# Patient Record
Sex: Male | Born: 1960 | Race: Black or African American | Hispanic: No | Smoking: Former smoker
Health system: Southern US, Community
[De-identification: ages and names within clinical notes are randomized; demographics above are authoritative.]

## PROBLEM LIST (undated history)

## (undated) DIAGNOSIS — J45909 Unspecified asthma, uncomplicated: Secondary | ICD-10-CM

## (undated) DIAGNOSIS — B1920 Unspecified viral hepatitis C without hepatic coma: Secondary | ICD-10-CM

## (undated) DIAGNOSIS — R51 Headache: Secondary | ICD-10-CM

## (undated) DIAGNOSIS — M549 Dorsalgia, unspecified: Secondary | ICD-10-CM

## (undated) DIAGNOSIS — I639 Cerebral infarction, unspecified: Secondary | ICD-10-CM

## (undated) DIAGNOSIS — G8929 Other chronic pain: Secondary | ICD-10-CM

## (undated) DIAGNOSIS — E7800 Pure hypercholesterolemia, unspecified: Secondary | ICD-10-CM

## (undated) DIAGNOSIS — F32A Depression, unspecified: Secondary | ICD-10-CM

## (undated) DIAGNOSIS — I10 Essential (primary) hypertension: Secondary | ICD-10-CM

## (undated) DIAGNOSIS — I2510 Atherosclerotic heart disease of native coronary artery without angina pectoris: Secondary | ICD-10-CM

## (undated) DIAGNOSIS — R42 Dizziness and giddiness: Secondary | ICD-10-CM

## (undated) DIAGNOSIS — K7460 Unspecified cirrhosis of liver: Secondary | ICD-10-CM

## (undated) DIAGNOSIS — E119 Type 2 diabetes mellitus without complications: Secondary | ICD-10-CM

## (undated) DIAGNOSIS — I219 Acute myocardial infarction, unspecified: Secondary | ICD-10-CM

## (undated) DIAGNOSIS — K8020 Calculus of gallbladder without cholecystitis without obstruction: Secondary | ICD-10-CM

## (undated) DIAGNOSIS — C801 Malignant (primary) neoplasm, unspecified: Secondary | ICD-10-CM

## (undated) DIAGNOSIS — G43109 Migraine with aura, not intractable, without status migrainosus: Secondary | ICD-10-CM

## (undated) DIAGNOSIS — M542 Cervicalgia: Secondary | ICD-10-CM

## (undated) DIAGNOSIS — R202 Paresthesia of skin: Secondary | ICD-10-CM

## (undated) DIAGNOSIS — F329 Major depressive disorder, single episode, unspecified: Secondary | ICD-10-CM

## (undated) HISTORY — PX: BYPASS GRAFT: SHX909

## (undated) HISTORY — PX: CARDIAC SURGERY: SHX584

## (undated) HISTORY — PX: CORONARY ANGIOPLASTY: SHX604

## (undated) HISTORY — PX: CORONARY ARTERY BYPASS GRAFT: SHX141

## (undated) HISTORY — PX: ORTHOPEDIC SURGERY: SHX850

---

## 1982-01-25 HISTORY — PX: HAND SURGERY: SHX662

## 2000-08-14 ENCOUNTER — Encounter: Payer: Self-pay | Admitting: Emergency Medicine

## 2000-08-14 ENCOUNTER — Emergency Department (HOSPITAL_COMMUNITY): Admission: EM | Admit: 2000-08-14 | Discharge: 2000-08-14 | Payer: Self-pay | Admitting: Emergency Medicine

## 2002-09-02 ENCOUNTER — Encounter: Payer: Self-pay | Admitting: Emergency Medicine

## 2002-09-02 ENCOUNTER — Emergency Department (HOSPITAL_COMMUNITY): Admission: EM | Admit: 2002-09-02 | Discharge: 2002-09-03 | Payer: Self-pay

## 2003-08-23 ENCOUNTER — Emergency Department (HOSPITAL_COMMUNITY): Admission: EM | Admit: 2003-08-23 | Discharge: 2003-08-23 | Payer: Self-pay | Admitting: Emergency Medicine

## 2005-04-28 ENCOUNTER — Emergency Department (HOSPITAL_COMMUNITY): Admission: EM | Admit: 2005-04-28 | Discharge: 2005-04-28 | Payer: Self-pay | Admitting: Emergency Medicine

## 2005-04-28 ENCOUNTER — Ambulatory Visit (HOSPITAL_COMMUNITY): Admission: RE | Admit: 2005-04-28 | Discharge: 2005-04-28 | Payer: Self-pay | Admitting: Pulmonary Disease

## 2006-02-28 ENCOUNTER — Inpatient Hospital Stay (HOSPITAL_COMMUNITY): Admission: EM | Admit: 2006-02-28 | Discharge: 2006-03-01 | Payer: Self-pay | Admitting: Emergency Medicine

## 2007-02-28 ENCOUNTER — Emergency Department (HOSPITAL_COMMUNITY): Admission: EM | Admit: 2007-02-28 | Discharge: 2007-02-28 | Payer: Self-pay | Admitting: Emergency Medicine

## 2008-04-14 ENCOUNTER — Emergency Department (HOSPITAL_COMMUNITY): Admission: EM | Admit: 2008-04-14 | Discharge: 2008-04-14 | Payer: Self-pay | Admitting: Emergency Medicine

## 2008-06-05 ENCOUNTER — Emergency Department: Payer: Self-pay | Admitting: Emergency Medicine

## 2009-07-02 ENCOUNTER — Ambulatory Visit: Payer: Self-pay | Admitting: Gastroenterology

## 2010-04-23 ENCOUNTER — Ambulatory Visit: Payer: Self-pay | Admitting: Gastroenterology

## 2010-05-07 LAB — POCT CARDIAC MARKERS

## 2010-05-07 LAB — ETHANOL: Alcohol, Ethyl (B): 41 mg/dL — ABNORMAL HIGH (ref 0–10)

## 2010-05-07 LAB — DIFFERENTIAL
Basophils Relative: 1 % (ref 0–1)
Eosinophils Relative: 6 % — ABNORMAL HIGH (ref 0–5)
Monocytes Relative: 9 % (ref 3–12)
Neutrophils Relative %: 35 % — ABNORMAL LOW (ref 43–77)

## 2010-05-07 LAB — CBC
HCT: 40.8 % (ref 39.0–52.0)
MCHC: 33.7 g/dL (ref 30.0–36.0)
MCV: 92.2 fL (ref 78.0–100.0)
RBC: 4.43 MIL/uL (ref 4.22–5.81)
RDW: 14.3 % (ref 11.5–15.5)
WBC: 6.4 10*3/uL (ref 4.0–10.5)

## 2010-05-07 LAB — COMPREHENSIVE METABOLIC PANEL
Albumin: 3.3 g/dL — ABNORMAL LOW (ref 3.5–5.2)
CO2: 28 mEq/L (ref 19–32)
Chloride: 101 mEq/L (ref 96–112)
Creatinine, Ser: 1.14 mg/dL (ref 0.4–1.5)
GFR calc non Af Amer: >60 mL/min (ref >60)
Glucose, Bld: 104 mg/dL — ABNORMAL HIGH (ref 70–99)
Potassium: 3.3 mEq/L — ABNORMAL LOW (ref 3.5–5.1)
Sodium: 136 mEq/L (ref 135–145)
Total Protein: 6.5 g/dL (ref 6.0–8.3)

## 2010-06-12 NOTE — H&P (Signed)
[name], [name]            ACCOUNT NO.:  [id number]   MEDICAL RECORD NO.:  [id number]          PATIENT TYPE:  OBV   LOCATION:  A216                          FACILITY:  [facility]   PHYSICIAN:  Skeet Latch, DO    DATE OF BIRTH:  1960/05/01   DATE OF ADMISSION:  02/26/2006  DATE OF DISCHARGE:  LH                              HISTORY & PHYSICAL   PRIMARY CARE PHYSIC[facility].   CHIEF COMPLAINT:  Dizziness.   HISTORY OF PRESENT ILLNESS:  This is a 50 year old African-American male  who presents complaining of 3-day history of dizziness.  The patient  states that he feels like his surroundings are spinning and this causes  nausea and blurry vision.  He denies any chest pain or syncopal episode.  The patient states symptoms are aggravated by certain positions and  especially with certain head movements.  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  The patient  states that at this time the dizziness does cause him to have a headache  and has never had this in the past.   PAST MEDICAL HISTORY:  Includes hypertension in which he was recently  placed on medications.   PAST SURGICAL HISTORY:  Orthopedic procedures on bilateral legs.   FAMILY HISTORY:  Positive for coronary artery disease, hypertension, and  diabetes.   SOCIAL HISTORY:  Alcohol abuse, drug abuse and is a cigarette smoker.   MEDICATIONS INCLUDE:  Folic acid 1 mg daily.  Artificial Tears.  Nicotine patch 7 mg q.24 h.  Multivitamin daily.  Thiamine 100 mg daily.  Lisinopril 40 mg daily.  Hydrochlorothiazide 25 mg daily.   ALLERGIES:  NO KNOWN DRUG ALLERGIES.   REVIEW OF SYSTEMS:  HEENT:  Positive headache, blurry vision.  NEUROVASCULAR:  Negative for chest pain and palpitations.  RESPIRATORY:  Denies any cough, dyspnea, or wheezing.  GI:  Positive for abdominal  pain, positive for nausea.  GENITOURINARY:  No dysuria, urgency, flank  pain.  MUSCULOSKELETAL:  Denies  any back pain or arthritic pain.  HEMATOLOGIC:  Denies any anemia, bleeding, or bruising.   PHYSICAL EXAMINATION:  VITAL SIGNS:  Temperature is 98.2, pulse 63,  blood pressure 128/93, the patient is saturating 100%.  HEENT:  Head is atraumatic, normocephalic.  Eyes are PERRLA.  Sclerae  did look slightly icteric.  NECK:  Supple.  No JVD.  No thyromegaly.  CARDIOVASCULAR:  Bradycardic and no rubs, gallops, murmurs.  RESPIRATORY:  Breath sounds equal bilaterally.  No rales, rhonchi, or  wheezing noted.  ABDOMEN:  Soft, nontender, nondistended.  No rigidity.  Positive bowel  sounds.  EXTREMITIES:  No clubbing, cyanosis, or edema noted.  NEURO:  Cranial nerves II-XII are grossly intact.  The patient is alert  and oriented x3.   LABORATORIES:  White blood cell count 6.5, hemoglobin 14.9, hematocrit  45.1, platelet count 137.  Sodium 137, potassium 3.6, chloride 102, CO2  is 30, glucose 132, BUN 5, creatinine 0.93, alkaline phosphatase 132,  AST 101, ALT 102, calcium is 9.4, albumin 3.2.   ASSESSMENT:  1. Benign positional vertigo.  2. Bradycardia.  3. Alcohol abuse.   PLAN:  The patient will be admitted into observation.  Vital signs will  be monitored closely.  We will IV hydrate patient with normal saline.  We will repeat CBC, BMP, calcium, alk phos in the morning.  I will also  get a urine drug screen as well as a TSH.  We will start the patient on  Antivert 25 mg p.o. t.i.d. for vertigo.  For bradycardia, we will repeat  an EKG in the morning.  We will also place the patient on alcohol  withdrawal management protocol secondary to patient being recently  released from rehab for cocaine and alcohol abuse.  Unsure if the  patient's dizziness may be related to recent placement of blood pressure  medications.  I will hold blood pressure medications at this time unless  the patient becomes hypertensive.  I will follow patient closely.      Skeet Latch, DO  Electronically  Signed     SM/MEDQ  D:  02/26/2006  T:  02/26/2006  Job:  [phone] [id number]

## 2010-06-12 NOTE — Discharge Summary (Signed)
[name], [name]            ACCOUNT NO.:  [id number]   MEDICAL RECORD NO.:  [id number]          PATIENT TYPE:  INP   LOCATION:  A216                          FACILITY:  [facility]   PHYSICIAN:  Skeet Latch, DO    DATE OF BIRTH:  1960/03/20   DATE OF ADMISSION:  02/26/2006  DATE OF DISCHARGE:  02/05/2008LH                               DISCHARGE SUMMARY   DISCHARGE DIAGNOSES:  1. Benign positional vertigo.  2. Alcohol and substance abuse.  3. Hypertension.  4. Elevated transaminases.   BRIEF HOSPITAL COURSE:  [name] is a 50 year old African-American  male who presented complaining of a 3-day history of dizziness.  The  patient said he felt like his surroundings were spinning.  It caused  nausea and some blurry vision.  The patient denied any chest pain,  syncopal episode, abdominal pain or urinary complaints.  The patient was  recently discharged from rehabilitation approximately 2 weeks ago for  cocaine and alcohol abuse.  The patient was placed on hypertensive  medications recently also, and is unsure if this caused his dizziness.  The patient was admitted to the telemetry unit for evaluation.  The  patient was placed on alcohol withdrawal protocol with Ativan.  The  patient was placed on Antivert, which seemed to help his dizziness.  Per  EKG, the patient was found to be slightly bradycardic.  This has  improved over the last few days.  The patient was subsequently started  on his home medications which include thiamine multivitamin and his  lisinopril.  The patient states that his headache is still present but  improved.  He states that he still has some dizziness, but the meclizine  has improved his vertigo.  His hepatitis panel is still pending.  The  patient did have elevated liver enzymes.  I believe this is due to his  alcohol abuse.  The patient did have a CT scan of his head, which was  within normal limits.  The patient was still hypertensive during his  stay and was placed on a clonidine 0.2 patch daily.  At this time, the  patient's lab values are stable, so the patient is ready for discharge.   DISCUSSION:  Condition is stable.  He will be discharged to home.   MEDICATIONS:  1. Clonidine patch 0.2 mg q weekly.  2. Lisinopril 40 mg daily.  3. Meclizine 25 mg p.o. t.i.d. p.r.n.  4. Thiamine 100 mg daily.  5. HCTZ 25 mg daily.  6. Multivitamin daily.   The patient has an appointment with a substance abuse counselor.  The  patient is instructed to stop all alcohol and any drug abuse of any  kind.  The nursing staff will make an appointment with an unassigned  physician in the next  102 weeks for the patient to have follow up.  The patient may need  either a neurology of an ENT consult as an outpatient if the dizziness  continues.  The patient is to have a low sodium heart healthy diet and  to increase his activity slowly.      Skeet Latch, DO  Electronically Signed     SM/MEDQ  D:  03/01/2006  T:  03/01/2006  Job:  [id number]

## 2010-10-16 LAB — RAPID URINE DRUG SCREEN, HOSP PERFORMED
Amphetamines: NOT DETECTED
Barbiturates: NOT DETECTED
Benzodiazepines: NOT DETECTED
Cocaine: NOT DETECTED
Opiates: NOT DETECTED
Tetrahydrocannabinol: NOT DETECTED

## 2010-10-16 LAB — BASIC METABOLIC PANEL
BUN: 14
CO2: 27
Calcium: 8.6
Creatinine, Ser: 0.96
GFR calc non Af Amer: >60
Glucose, Bld: 110 — ABNORMAL HIGH

## 2010-10-16 LAB — URINALYSIS, ROUTINE W REFLEX MICROSCOPIC
Bilirubin Urine: NEGATIVE
Glucose, UA: NEGATIVE
Hgb urine dipstick: NEGATIVE
Specific Gravity, Urine: 1.02

## 2010-10-16 LAB — POCT CARDIAC MARKERS
CKMB, poc: 2.3
Myoglobin, poc: 102
Myoglobin, poc: 132

## 2010-10-16 LAB — DIFFERENTIAL
Basophils Absolute: 0
Basophils Relative: 0
Monocytes Absolute: 0.5
Neutro Abs: 2.5

## 2010-10-16 LAB — B-NATRIURETIC PEPTIDE (CONVERTED LAB): Pro B Natriuretic peptide (BNP): <30

## 2010-10-16 LAB — CBC
Hemoglobin: 13.7
MCHC: 34.6
Platelets: 106 — ABNORMAL LOW
RDW: 13.9

## 2011-06-13 ENCOUNTER — Emergency Department (HOSPITAL_COMMUNITY): Payer: Non-veteran care

## 2011-06-13 ENCOUNTER — Inpatient Hospital Stay (HOSPITAL_COMMUNITY)
Admission: EM | Admit: 2011-06-13 | Discharge: 2011-06-16 | DRG: 103 | Disposition: A | Discharge status: Home / Self Care | Payer: Non-veteran care | Attending: Internal Medicine | Admitting: Internal Medicine

## 2011-06-13 ENCOUNTER — Encounter (HOSPITAL_COMMUNITY): Payer: Self-pay | Admitting: *Deleted

## 2011-06-13 DIAGNOSIS — E7800 Pure hypercholesterolemia, unspecified: Secondary | ICD-10-CM

## 2011-06-13 DIAGNOSIS — M549 Dorsalgia, unspecified: Secondary | ICD-10-CM | POA: Diagnosis present

## 2011-06-13 DIAGNOSIS — R4781 Slurred speech: Secondary | ICD-10-CM

## 2011-06-13 DIAGNOSIS — F329 Major depressive disorder, single episode, unspecified: Secondary | ICD-10-CM | POA: Diagnosis present

## 2011-06-13 DIAGNOSIS — B1920 Unspecified viral hepatitis C without hepatic coma: Secondary | ICD-10-CM

## 2011-06-13 DIAGNOSIS — R51 Headache: Secondary | ICD-10-CM

## 2011-06-13 DIAGNOSIS — I6330 Cerebral infarction due to thrombosis of unspecified cerebral artery: Secondary | ICD-10-CM

## 2011-06-13 DIAGNOSIS — I10 Essential (primary) hypertension: Secondary | ICD-10-CM

## 2011-06-13 DIAGNOSIS — G8929 Other chronic pain: Secondary | ICD-10-CM

## 2011-06-13 DIAGNOSIS — G43109 Migraine with aura, not intractable, without status migrainosus: Principal | ICD-10-CM | POA: Diagnosis present

## 2011-06-13 DIAGNOSIS — I639 Cerebral infarction, unspecified: Secondary | ICD-10-CM | POA: Diagnosis present

## 2011-06-13 DIAGNOSIS — R202 Paresthesia of skin: Secondary | ICD-10-CM

## 2011-06-13 DIAGNOSIS — R519 Headache, unspecified: Secondary | ICD-10-CM | POA: Diagnosis present

## 2011-06-13 DIAGNOSIS — F3289 Other specified depressive episodes: Secondary | ICD-10-CM | POA: Diagnosis present

## 2011-06-13 DIAGNOSIS — K7460 Unspecified cirrhosis of liver: Secondary | ICD-10-CM

## 2011-06-13 DIAGNOSIS — M542 Cervicalgia: Secondary | ICD-10-CM | POA: Diagnosis present

## 2011-06-13 DIAGNOSIS — G4733 Obstructive sleep apnea (adult) (pediatric): Secondary | ICD-10-CM | POA: Diagnosis present

## 2011-06-13 DIAGNOSIS — R209 Unspecified disturbances of skin sensation: Secondary | ICD-10-CM | POA: Diagnosis present

## 2011-06-13 DIAGNOSIS — F32A Depression, unspecified: Secondary | ICD-10-CM | POA: Diagnosis present

## 2011-06-13 DIAGNOSIS — R4789 Other speech disturbances: Secondary | ICD-10-CM | POA: Diagnosis present

## 2011-06-13 DIAGNOSIS — R079 Chest pain, unspecified: Secondary | ICD-10-CM | POA: Diagnosis not present

## 2011-06-13 DIAGNOSIS — Z87891 Personal history of nicotine dependence: Secondary | ICD-10-CM

## 2011-06-13 HISTORY — DX: Dorsalgia, unspecified: M54.9

## 2011-06-13 HISTORY — DX: Migraine with aura, not intractable, without status migrainosus: G43.109

## 2011-06-13 HISTORY — DX: Essential (primary) hypertension: I10

## 2011-06-13 HISTORY — DX: Dizziness and giddiness: R42

## 2011-06-13 HISTORY — DX: Cervicalgia: M54.2

## 2011-06-13 HISTORY — DX: Major depressive disorder, single episode, unspecified: F32.9

## 2011-06-13 HISTORY — DX: Other chronic pain: G89.29

## 2011-06-13 HISTORY — DX: Pure hypercholesterolemia, unspecified: E78.00

## 2011-06-13 HISTORY — DX: Headache: R51

## 2011-06-13 HISTORY — DX: Unspecified viral hepatitis C without hepatic coma: B19.20

## 2011-06-13 HISTORY — DX: Depression, unspecified: F32.A

## 2011-06-13 HISTORY — DX: Paresthesia of skin: R20.2

## 2011-06-13 HISTORY — DX: Unspecified cirrhosis of liver: K74.60

## 2011-06-13 HISTORY — DX: Calculus of gallbladder without cholecystitis without obstruction: K80.20

## 2011-06-13 LAB — DIFFERENTIAL
Lymphs Abs: 3.9 10*3/uL (ref 0.7–4.0)
Monocytes Relative: 8 % (ref 3–12)
Neutro Abs: 2.2 10*3/uL (ref 1.7–7.7)
Neutrophils Relative %: 31 % — ABNORMAL LOW (ref 43–77)

## 2011-06-13 LAB — HEPATIC FUNCTION PANEL
Albumin: 3.3 g/dL — ABNORMAL LOW (ref 3.5–5.2)
Total Bilirubin: 0.7 mg/dL (ref 0.3–1.2)
Total Protein: 7.1 g/dL (ref 6.0–8.3)

## 2011-06-13 LAB — URINALYSIS, ROUTINE W REFLEX MICROSCOPIC
Glucose, UA: NEGATIVE mg/dL
Hgb urine dipstick: NEGATIVE
Specific Gravity, Urine: 1.025 (ref 1.005–1.030)
pH: 6.5 (ref 5.0–8.0)

## 2011-06-13 LAB — BASIC METABOLIC PANEL
Chloride: 103 mEq/L (ref 96–112)
GFR calc Af Amer: >90 mL/min (ref >90)
GFR calc non Af Amer: 83 mL/min — ABNORMAL LOW (ref >90)
Glucose, Bld: 92 mg/dL (ref 70–99)
Potassium: 3.4 mEq/L — ABNORMAL LOW (ref 3.5–5.1)
Sodium: 140 mEq/L (ref 135–145)

## 2011-06-13 LAB — AMMONIA: Ammonia: 55 umol/L (ref 11–60)

## 2011-06-13 LAB — CBC
Hemoglobin: 13.9 g/dL (ref 13.0–17.0)
RBC: 4.75 MIL/uL (ref 4.22–5.81)

## 2011-06-13 MED ORDER — SODIUM CHLORIDE 0.9 % IV SOLN
INTRAVENOUS | Status: DC
  Administered 2011-06-13: 21:00:00 via INTRAVENOUS

## 2011-06-13 MED ORDER — FENTANYL CITRATE 0.05 MG/ML IJ SOLN
50.0000 ug | INTRAMUSCULAR | Status: AC | PRN
  Administered 2011-06-13 (×2): 50 ug via INTRAVENOUS
  Filled 2011-06-13 (×2): qty 2

## 2011-06-13 NOTE — ED Provider Notes (Signed)
History     CSN: [id number]  Arrival date & time 06/13/11  1308   First MD Initiated Contact with Patient 06/13/11 1956      Chief Complaint  Patient presents with  . Headache    HPI Pt was seen at 2000.  Per pt, c/o gradual onset and persistence of constant acute flair of his chronic migraine headache since today at approx 1300.  Describes the headache as per his usual chronic migraine headache pain pattern for the past several months.  Describes the pain as located in his frontal area, radiates all over his head "and all the way down my back."  States his headaches occur daily.  States he has been eval by his PMD at the [place] for same, dx "migraine headaches," but not given any pain meds "until they do more testing."  Cannot recall what testing his PMD wanted to do.  Endorses he has been eval by a Neuro MD at the [facility] 3 days ago when he noticed he had "slurred speech."  Cannot recall what this MD told him.  States today's headache has been associated with right sided facial and LUE numbness.  Denies any change in his usual chronic LLE paresthesias due to his chronic LBP.  Denies headache was sudden or maximal in onset or at any time.  Denies visual changes, no focal motor weakness, no dysphagia, no fevers, no injury, no rash, no CP/SOB, no cough, no abd pain, no N/V/D.      [facility]  Past Medical History  Diagnosis Date  . Hypertension   . Hepatitis C   . Cirrhosis of liver   . Depression   . Gallstones   . Chronic back pain   . Left leg paresthesias   . Vertigo   . Hypercholesteremia   . Chronic neck pain   . Headache     Past Surgical History  Procedure Date  . Orthopedic surgery     "on my legs"    History  Substance Use Topics  . Smoking status: Current Everyday Smoker  . Smokeless tobacco: Not on file  . Alcohol Use: No     former alcoholic    Review of Systems ROS: Statement: All systems negative except as marked or noted in the HPI; Constitutional: Negative for  fever and chills. ; ; Eyes: Negative for eye pain, redness and discharge. ; ; ENMT: Negative for ear pain, hoarseness, nasal congestion, sinus pressure and sore throat. ; ; Cardiovascular: Negative for chest pain, palpitations, diaphoresis, dyspnea and peripheral edema. ; ; Respiratory: Negative for cough, wheezing and stridor. ; ; Gastrointestinal: Negative for nausea, vomiting, diarrhea, abdominal pain, blood in stool, hematemesis, jaundice and rectal bleeding. . ; ; Genitourinary: Negative for dysuria, flank pain and hematuria. ; ; Musculoskeletal: Negative for swelling and trauma.; ; Skin: Negative for pruritus, rash, abrasions, blisters, bruising and skin lesion.; ; Neuro: +headache, slurred speech, paresthesias.  Negative for lightheadedness and neck stiffness. Negative for weakness, altered level of consciousness , altered mental status, extremity weakness, involuntary movement, seizure and syncope.     Allergies  Bee venom  Home Medications  No current outpatient prescriptions on file.  BP 152/95  Pulse 54  Temp(Src) 97.9 F (36.6 C) (Oral)  Resp 16  Ht 6' (1.829 m)  Wt 230 lb (104.327 kg)  BMI 31.19 kg/m2  SpO2 97%  Physical Exam 2005: Physical examination:  Nursing notes reviewed; Vital signs and O2 SAT reviewed;  Constitutional: Well developed, Well nourished,  Well hydrated, In no acute distress; Head:  Normocephalic, atraumatic; Eyes: EOMI, PERRL, No scleral icterus; ENMT: Mouth and pharynx normal, Mucous membranes moist; Neck: Supple, Full range of motion, No lymphadenopathy; Cardiovascular: Regular rate and rhythm, No murmur, rub, or gallop; Respiratory: Breath sounds clear & equal bilaterally, No rales, rhonchi, wheezes, or rub, Normal respiratory effort/excursion; Chest: Nontender, Movement normal; Abdomen: Soft, Nontender, Nondistended, Normal bowel sounds; Genitourinary: No CVA tenderness; Spine:  No midline CS, TS, LS tenderness.  +TTP left cervical, thoracic and lumbar  paraspinal muscles.; Extremities: Pulses normal, No tenderness, No edema, No calf edema or asymmetry.; Neuro: AA&Ox3, Major CN grossly intact.  Strength 5/5 equal bilat UE's and LE's.  DTR 2/4 equal bilat UE's and LE's.  +subjective decreased sensation right face, LUE and LLE; otherwise other gross sensory deficits.  Normal cerebellar testing bilat UE's and LE's.  Speech slurred.  No facial droop.  +right gaze horizontal fatigable nystagmus.; Skin: Color normal, Warm, Dry, no rash.   ED Course  Procedures    MDM  MDM Reviewed: nursing note, vitals and previous chart Reviewed previous: ECG Interpretation: CT scan, labs, x-ray and ECG      Date: 06/13/2011  Rate: 67  Rhythm: normal sinus rhythm  QRS Axis: normal  Intervals: normal  ST/T Wave abnormalities: nonspecific T wave changes, flipped T-waves leads V3-V6, II, III, aVF  Conduction Disutrbances:nonspecific intraventricular conduction delay  Narrative Interpretation:   Old EKG Reviewed: changes noted; flipped T-waves leads V4-V6, II, III, aVF.  Results for orders placed during the hospital encounter of 06/13/11  BASIC METABOLIC PANEL      Component Value Range   Sodium 140  135 - 145 (mEq/L)   Potassium 3.4 (*) 3.5 - 5.1 (mEq/L)   Chloride 103  96 - 112 (mEq/L)   CO2 29  19 - 32 (mEq/L)   Glucose, Bld 92  70 - 99 (mg/dL)   BUN 14  6 - 23 (mg/dL)   Creatinine, Ser 2.13  0.50 - 1.35 (mg/dL)   Calcium 9.4  8.4 - 08.6 (mg/dL)   GFR calc non Af Amer 83 (*) >90 (mL/min)   GFR calc Af Amer >90  >90 (mL/min)  CBC      Component Value Range   WBC 7.1  4.0 - 10.5 (K/uL)   RBC 4.75  4.22 - 5.81 (MIL/uL)   Hemoglobin 13.9  13.0 - 17.0 (g/dL)   HCT 57.8  46.9 - 62.9 (%)   MCV 86.5  78.0 - 100.0 (fL)   MCH 29.3  26.0 - 34.0 (pg)   MCHC 33.8  30.0 - 36.0 (g/dL)   RDW 52.8  41.3 - 24.4 (%)   Platelets 96 (*) 150 - 400 (K/uL)  DIFFERENTIAL      Component Value Range   Neutrophils Relative 31 (*) 43 - 77 (%)   Neutro Abs 2.2  1.7  - 7.7 (K/uL)   Lymphocytes Relative 55 (*) 12 - 46 (%)   Lymphs Abs 3.9  0.7 - 4.0 (K/uL)   Monocytes Relative 8  3 - 12 (%)   Monocytes Absolute 0.6  0.1 - 1.0 (K/uL)   Eosinophils Relative 6 (*) 0 - 5 (%)   Eosinophils Absolute 0.4  0.0 - 0.7 (K/uL)   Basophils Relative 0  0 - 1 (%)   Basophils Absolute 0.0  0.0 - 0.1 (K/uL)  URINALYSIS, ROUTINE W REFLEX MICROSCOPIC      Component Value Range   Color, Urine YELLOW  YELLOW  APPearance CLEAR  CLEAR    Specific Gravity, Urine 1.025  1.005 - 1.030    pH 6.5  5.0 - 8.0    Glucose, UA NEGATIVE  NEGATIVE (mg/dL)   Hgb urine dipstick NEGATIVE  NEGATIVE    Bilirubin Urine NEGATIVE  NEGATIVE    Ketones, ur NEGATIVE  NEGATIVE (mg/dL)   Protein, ur NEGATIVE  NEGATIVE (mg/dL)   Urobilinogen, UA >1.1 (*) 0.0 - 1.0 (mg/dL)   Nitrite NEGATIVE  NEGATIVE    Leukocytes, UA NEGATIVE  NEGATIVE   TROPONIN I      Component Value Range   Troponin I <0.30  <0.30 (ng/mL)  ETHANOL      Component Value Range   Alcohol, Ethyl (B) <11  0 - 11 (mg/dL)  HEPATIC FUNCTION PANEL      Component Value Range   Total Protein 7.1  6.0 - 8.3 (g/dL)   Albumin 3.3 (*) 3.5 - 5.2 (g/dL)   AST 71 (*) 0 - 37 (U/L)   ALT 62 (*) 0 - 53 (U/L)   Alkaline Phosphatase 153 (*) 39 - 117 (U/L)   Total Bilirubin 0.7  0.3 - 1.2 (mg/dL)   Bilirubin, Direct 0.3  0.0 - 0.3 (mg/dL)   Indirect Bilirubin 0.4  0.3 - 0.9 (mg/dL)  AMMONIA      Component Value Range   Ammonia 55  11 - 60 (umol/L)   Dg Chest 2 View 06/13/2011  *RADIOLOGY REPORT*  Clinical Data: Altered mental status, weakness.  CHEST - 2 VIEW  Comparison: 04/14/2008  Findings: Heart size upper normal to mildly enlarged.  Mild mediastinal prominence may be exaggerated by hypoaeration. Interstitial prominence and minimal lung base atelectasis, otherwise without focal consolidation, pleural effusion, or pneumothorax.  No acute osseous finding.  IMPRESSION: Heart size upper normal to mildly enlarged, with interstitial  prominence. Minimal linear lung base opacities, likely atelectasis. Otherwise, no focal consolidation.  Original Report Authenticated By: Waneta Martins, M.D.   Ct Head Wo Contrast 06/13/2011  *RADIOLOGY REPORT*  Clinical Data: Slurred speech, weakness.  CT HEAD WITHOUT CONTRAST  Technique:  Contiguous axial images were obtained from the base of the skull through the vertex without contrast.  Comparison: 04/14/2008  Findings: Focal hypoattenuation along the right external capsule is unchanged, perhaps a prior lacunar infarction. There is no evidence for acute hemorrhage, hydrocephalus, mass lesion, or abnormal extra- axial fluid collection.  No definite CT evidence for acute infarction.  The visualized paranasal sinuses and mastoid air cells are predominately clear.  IMPRESSION: No acute intracranial abnormality.  Original Report Authenticated By: Waneta Martins, M.D.    Results for JAMEIR, AKE (MRN [id number]) as of 06/13/2011 22:04  Ref. Range 04/14/2008 20:18 06/13/2011 20:50  AST Latest Range: 0-37 U/L 144 (H) 71 (H)  ALT Latest Range: 0-53 U/L 131 (H) 62 (H)      10:05 PM:   Pt not code stroke, as he has had symptoms x3 days.  Doubt SAH, as headache has not been sudden or maximal, and is no different from his usual chronic headache pain.  No acute CVA or bleeding seen on CT scan.  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  Need to r/o CVA vs complex migraine vs chronic pain.  T/C to [facility]. Orvan Falconer, case discussed, including:  HPI, pertinent PM/SHx, VS/PE, dx testing, ED course and treatment:  Agreeable to observation admit, requests to obtain tele bed.       Laray Anger,  DO 06/14/11 1719

## 2011-06-13 NOTE — ED Notes (Signed)
Pt reports today around 1 pm pt began to have a severe headache in frontal area and to back of head, pt reports pain is radiating down left neck

## 2011-06-13 NOTE — H&P (Signed)
PCP:   [facility]  Chief Complaint:  Slurred speech and right facial weakness  HPI: [name] is an 51 y.o. male.  Obese African American veteran with a history of liver cirrhosis, hypertension, migraine headaches, and chronic pain, chronic left leg paresthesias has been having severe headaches for the past 3 days, but reportedly his pain physicians and [facility] physicians have declined specific treatment pending further investigation. After midday today the patient noted that his speech was slurred and the right side of his face felt numb. Wife also reports such a pattern was slurred speech she noticed that his face was asymmetric. He arrived at the emergency room 5-1/2 hours after the onset of symptoms, facial weakness and slurred speech were noted. He has no old and new limb weakness, but he does report increased pain sensitivity in the right upper extremity.  Hospitalist service was called to assist with management about 8 hours after symptom onset.  There is no history of fever cough or cold chest pains or shortness black or bloody stool; he continues to have headache.  He has  screw in his left ankle 1989, but says he has had MRIs done since having that screw placed.  He has passed an initial swallowing screen.  Rewiew of Systems:  The patient denies anorexia, fever, weight loss,, vision loss, decreased hearing, hoarseness, chest pain, syncope, dyspnea on exertion, peripheral edema,, hemoptysis, abdominal pain, melena, hematochezia, severe indigestion/heartburn, hematuria, incontinence, genital sores, muscle weakness, suspicious skin lesions, transient blindness,  depression, unusual weight change, abnormal bleeding, enlarged lymph nodes, angioedema, and breast masses.    Past Medical History  Diagnosis Date  . Hypertension   . Hepatitis C   . Cirrhosis of liver   . Depression   . Gallstones   . Chronic back pain   . Left leg paresthesias   . Vertigo   .  Hypercholesteremia   . Chronic neck pain   . Headache     Past Surgical History  Procedure Date  . Orthopedic surgery     "on my legs"    Medications:  Medications not available at the time of  Admission; neither he nor his wife knows the names of any of his medications. HOME MEDS: Prior to Admission medications   Not on File     Allergies:  Allergies  Allergen Reactions  . Bee Venom Anaphylaxis and Swelling    Social History:   reports that he quit smoking about 2 years ago. He does not have any smokeless tobacco history on file. He reports that he does not drink alcohol or use illicit drugs.  Family History: Family History  Problem Relation Age of Onset  . Diabetes Brother   . Diabetes Sister   . Diabetes Father   . Diabetes Mother   . Hypertension Mother   . Hypertension Father   . Hypertension Sister   . Hypertension Brother      Physical Exam: Filed Vitals:   06/13/11 2100 06/13/11 2139 06/13/11 2149 06/13/11 2311  BP: 155/99 153/90 152/95 149/87  Pulse: 55 53 54 52  Temp:   97.9 F (36.6 C) 98 F (36.7 C)  TempSrc:   Oral Oral  Resp:  17 16 16   Height:      Weight:      SpO2: 95% 94% 97% 97%   Blood pressure 149/87, pulse 52, temperature 98 F (36.7 C), temperature source Oral, resp. rate 16, height 6' (1.829 m), weight 104.327 kg (230 lb), SpO2  97.00%.  GEN:  Pleasant middle-aged African American man lying in the stretcher in no acute distress; cooperative with exam Difficult to understand because of his slurred speech; his wife filled in some of the history. PSYCH:  alert and oriented x4; does not appear anxious or depressed; affect is appropriate. HEENT: Mucous membranes pink and anicteric; PERRLA; EOM intact; no cervical lymphadenopathy nor thyromegaly or carotid bruit; no JVD; Breasts:: Not examined CHEST WALL: No tenderness CHEST: Normal respiration, clear to auscultation bilaterally HEART: Regular rate and rhythm; no murmurs rubs or  gallops BACK:  no CVA tenderness ABDOMEN: Obese, soft non-tender; no masses, no organomegaly, normal abdominal bowel sounds; no pannus; no intertriginous candida. Rectal Exam: Not done EXTREMITIES:  age-appropriate arthropathy of the hands and knees; no edema; no ulcerations. Genitalia: not examined PULSES: 2+ and symmetric SKIN: Normal hydration no rash or ulceration CNS: Right facial droop: Skin site equal power in both upper extremities 4/5 on both sides; right upper extremity exquisitely sensitive to touch; says it feels; knee reflexes are equal bilaterally; no clonus   Labs & Imaging Results for orders placed during the hospital encounter of 06/13/11 (from the past 48 hour(s))  BASIC METABOLIC PANEL     Status: Abnormal   Collection Time   06/13/11  8:15 PM      Component Value Range Comment   Sodium 140  135 - 145 (mEq/L)    Potassium 3.4 (*) 3.5 - 5.1 (mEq/L)    Chloride 103  96 - 112 (mEq/L)    CO2 29  19 - 32 (mEq/L)    Glucose, Bld 92  70 - 99 (mg/dL)    BUN 14  6 - 23 (mg/dL)    Creatinine, Ser 8.65  0.50 - 1.35 (mg/dL)    Calcium 9.4  8.4 - 10.5 (mg/dL)    GFR calc non Af Amer 83 (*) >90 (mL/min)    GFR calc Af Amer >90  >90 (mL/min)   CBC     Status: Abnormal   Collection Time   06/13/11  8:15 PM      Component Value Range Comment   WBC 7.1  4.0 - 10.5 (K/uL)    RBC 4.75  4.22 - 5.81 (MIL/uL)    Hemoglobin 13.9  13.0 - 17.0 (g/dL)    HCT 78.4  69.6 - 29.5 (%)    MCV 86.5  78.0 - 100.0 (fL)    MCH 29.3  26.0 - 34.0 (pg)    MCHC 33.8  30.0 - 36.0 (g/dL)    RDW 28.4  13.2 - 44.0 (%)    Platelets 96 (*) 150 - 400 (K/uL)   DIFFERENTIAL     Status: Abnormal   Collection Time   06/13/11  8:15 PM      Component Value Range Comment   Neutrophils Relative 31 (*) 43 - 77 (%)    Neutro Abs 2.2  1.7 - 7.7 (K/uL)    Lymphocytes Relative 55 (*) 12 - 46 (%)    Lymphs Abs 3.9  0.7 - 4.0 (K/uL)    Monocytes Relative 8  3 - 12 (%)    Monocytes Absolute 0.6  0.1 - 1.0 (K/uL)     Eosinophils Relative 6 (*) 0 - 5 (%)    Eosinophils Absolute 0.4  0.0 - 0.7 (K/uL)    Basophils Relative 0  0 - 1 (%)    Basophils Absolute 0.0  0.0 - 0.1 (K/uL)   TROPONIN I     Status: Normal  Collection Time   06/13/11  8:15 PM      Component Value Range Comment   Troponin I <0.30  <0.30 (ng/mL)   ETHANOL     Status: Normal   Collection Time   06/13/11  8:15 PM      Component Value Range Comment   Alcohol, Ethyl (B) <11  0 - 11 (mg/dL)   URINALYSIS, ROUTINE W REFLEX MICROSCOPIC     Status: Abnormal   Collection Time   06/13/11  8:47 PM      Component Value Range Comment   Color, Urine YELLOW  YELLOW     APPearance CLEAR  CLEAR     Specific Gravity, Urine 1.025  1.005 - 1.030     pH 6.5  5.0 - 8.0     Glucose, UA NEGATIVE  NEGATIVE (mg/dL)    Hgb urine dipstick NEGATIVE  NEGATIVE     Bilirubin Urine NEGATIVE  NEGATIVE     Ketones, ur NEGATIVE  NEGATIVE (mg/dL)    Protein, ur NEGATIVE  NEGATIVE (mg/dL)    Urobilinogen, UA >1.6 (*) 0.0 - 1.0 (mg/dL)    Nitrite NEGATIVE  NEGATIVE     Leukocytes, UA NEGATIVE  NEGATIVE  MICROSCOPIC NOT DONE ON URINES WITH NEGATIVE PROTEIN, BLOOD, LEUKOCYTES, NITRITE, OR GLUCOSE <1000 mg/dL.  HEPATIC FUNCTION PANEL     Status: Abnormal   Collection Time   06/13/11  8:50 PM      Component Value Range Comment   Total Protein 7.1  6.0 - 8.3 (g/dL)    Albumin 3.3 (*) 3.5 - 5.2 (g/dL)    AST 71 (*) 0 - 37 (U/L)    ALT 62 (*) 0 - 53 (U/L)    Alkaline Phosphatase 153 (*) 39 - 117 (U/L)    Total Bilirubin 0.7  0.3 - 1.2 (mg/dL)    Bilirubin, Direct 0.3  0.0 - 0.3 (mg/dL)    Indirect Bilirubin 0.4  0.3 - 0.9 (mg/dL)   AMMONIA     Status: Normal   Collection Time   06/13/11  8:53 PM      Component Value Range Comment   Ammonia 55  11 - 60 (umol/L)    Dg Chest 2 View  06/13/2011  *RADIOLOGY REPORT*  Clinical Data: Altered mental status, weakness.  CHEST - 2 VIEW  Comparison: 04/14/2008  Findings: Heart size upper normal to mildly enlarged.  Mild  mediastinal prominence may be exaggerated by hypoaeration. Interstitial prominence and minimal lung base atelectasis, otherwise without focal consolidation, pleural effusion, or pneumothorax.  No acute osseous finding.  IMPRESSION: Heart size upper normal to mildly enlarged, with interstitial prominence. Minimal linear lung base opacities, likely atelectasis. Otherwise, no focal consolidation.  Original Report Authenticated By: Waneta Martins, M.D.   Ct Head Wo Contrast  06/13/2011  *RADIOLOGY REPORT*  Clinical Data: Slurred speech, weakness.  CT HEAD WITHOUT CONTRAST  Technique:  Contiguous axial images were obtained from the base of the skull through the vertex without contrast.  Comparison: 04/14/2008  Findings: Focal hypoattenuation along the right external capsule is unchanged, perhaps a prior lacunar infarction. There is no evidence for acute hemorrhage, hydrocephalus, mass lesion, or abnormal extra- axial fluid collection.  No definite CT evidence for acute infarction.  The visualized paranasal sinuses and mastoid air cells are predominately clear.  IMPRESSION: No acute intracranial abnormality.  Original Report Authenticated By: Waneta Martins, M.D.      Assessment Present on Admission:  .CVA (cerebral infarction) .Hypertension .Hepatitis C .Cirrhosis of  liver .Depression .Chronic back pain .Chronic neck pain .Headache .Hypercholesteremia .Left leg paresthesias   PLAN: We'll admit this gentleman for a stroke workup; Will resume his home pain medications when his meds are available Temporarily withhold antihypertensives; Neurology consult  Other plans as per orders.   Dashon Mcintire 06/13/2011, 11:39 PM

## 2011-06-14 ENCOUNTER — Inpatient Hospital Stay (HOSPITAL_COMMUNITY): Payer: Non-veteran care

## 2011-06-14 DIAGNOSIS — G8929 Other chronic pain: Secondary | ICD-10-CM

## 2011-06-14 DIAGNOSIS — I6330 Cerebral infarction due to thrombosis of unspecified cerebral artery: Secondary | ICD-10-CM

## 2011-06-14 DIAGNOSIS — I517 Cardiomegaly: Secondary | ICD-10-CM

## 2011-06-14 DIAGNOSIS — I10 Essential (primary) hypertension: Secondary | ICD-10-CM

## 2011-06-14 DIAGNOSIS — R51 Headache: Secondary | ICD-10-CM

## 2011-06-14 LAB — LIPID PANEL
Cholesterol: 184 mg/dL (ref 0–200)
HDL: 27 mg/dL — ABNORMAL LOW (ref >39)
LDL Cholesterol: 118 mg/dL — ABNORMAL HIGH (ref 0–99)
Total CHOL/HDL Ratio: 6.8 RATIO
Triglycerides: 194 mg/dL — ABNORMAL HIGH (ref <150)
VLDL: 39 mg/dL (ref 0–40)

## 2011-06-14 LAB — RAPID URINE DRUG SCREEN, HOSP PERFORMED
Barbiturates: NOT DETECTED
Cocaine: NOT DETECTED
Tetrahydrocannabinol: NOT DETECTED

## 2011-06-14 LAB — HEMOGLOBIN A1C
Hgb A1c MFr Bld: 5.8 % — ABNORMAL HIGH (ref <5.7)
Mean Plasma Glucose: 120 mg/dL — ABNORMAL HIGH (ref <117)

## 2011-06-14 LAB — APTT: aPTT: 34 seconds (ref 24–37)

## 2011-06-14 MED ORDER — TRAMADOL HCL 50 MG PO TABS
50.0000 mg | ORAL_TABLET | Freq: Two times a day (BID) | ORAL | Status: DC | PRN
  Administered 2011-06-14 – 2011-06-15 (×4): 50 mg via ORAL
  Filled 2011-06-14 (×4): qty 1

## 2011-06-14 MED ORDER — METHYLPREDNISOLONE SODIUM SUCC 1000 MG IJ SOLR
INTRAMUSCULAR | Status: AC
  Filled 2011-06-14: qty 8

## 2011-06-14 MED ORDER — GABAPENTIN 300 MG PO CAPS
600.0000 mg | ORAL_CAPSULE | Freq: Three times a day (TID) | ORAL | Status: DC
  Administered 2011-06-14 – 2011-06-16 (×7): 600 mg via ORAL
  Filled 2011-06-14: qty 2
  Filled 2011-06-14: qty 1
  Filled 2011-06-14 (×6): qty 2

## 2011-06-14 MED ORDER — ACETAMINOPHEN 325 MG PO TABS
650.0000 mg | ORAL_TABLET | ORAL | Status: DC | PRN
  Administered 2011-06-14 (×3): 650 mg via ORAL
  Filled 2011-06-14 (×4): qty 2

## 2011-06-14 MED ORDER — ENOXAPARIN SODIUM 40 MG/0.4ML ~~LOC~~ SOLN
40.0000 mg | SUBCUTANEOUS | Status: DC
  Administered 2011-06-14 – 2011-06-16 (×3): 40 mg via SUBCUTANEOUS
  Filled 2011-06-14 (×3): qty 0.4

## 2011-06-14 MED ORDER — BUTALBITAL-APAP-CAFFEINE 50-325-40 MG PO TABS
2.0000 | ORAL_TABLET | ORAL | Status: DC | PRN
  Administered 2011-06-14 – 2011-06-16 (×3): 2 via ORAL
  Filled 2011-06-14 (×3): qty 2

## 2011-06-14 MED ORDER — SODIUM CHLORIDE 0.9 % IV SOLN
500.0000 mg | INTRAVENOUS | Status: AC
  Administered 2011-06-14 – 2011-06-15 (×2): 500 mg via INTRAVENOUS
  Filled 2011-06-14 (×2): qty 4

## 2011-06-14 MED ORDER — ASPIRIN 325 MG PO TABS
325.0000 mg | ORAL_TABLET | Freq: Every day | ORAL | Status: DC
  Administered 2011-06-14 – 2011-06-16 (×3): 325 mg via ORAL
  Filled 2011-06-14 (×3): qty 1

## 2011-06-14 MED ORDER — SODIUM CHLORIDE 0.9 % IJ SOLN
INTRAMUSCULAR | Status: AC
  Filled 2011-06-14: qty 3

## 2011-06-14 MED ORDER — SODIUM CHLORIDE 0.9 % IV SOLN
INTRAVENOUS | Status: DC
  Filled 2011-06-14 (×2): qty 1000

## 2011-06-14 MED ORDER — ASPIRIN 300 MG RE SUPP
300.0000 mg | Freq: Every day | RECTAL | Status: DC
  Filled 2011-06-14 (×6): qty 1

## 2011-06-14 MED ORDER — LISINOPRIL 10 MG PO TABS: 40.0000 mg | ORAL_TABLET | Freq: Every day | ORAL | Status: DC

## 2011-06-14 MED ORDER — SODIUM CHLORIDE 0.9 % IJ SOLN
INTRAMUSCULAR | Status: AC
  Administered 2011-06-14: 09:00:00
  Filled 2011-06-14: qty 3

## 2011-06-14 MED ORDER — LISINOPRIL 10 MG PO TABS
10.0000 mg | ORAL_TABLET | Freq: Every day | ORAL | Status: DC
  Administered 2011-06-14 – 2011-06-16 (×3): 10 mg via ORAL
  Filled 2011-06-14 (×3): qty 1

## 2011-06-14 MED ORDER — SENNOSIDES-DOCUSATE SODIUM 8.6-50 MG PO TABS
1.0000 | ORAL_TABLET | Freq: Every evening | ORAL | Status: DC | PRN
  Administered 2011-06-14: 1 via ORAL
  Filled 2011-06-14: qty 1

## 2011-06-14 MED ORDER — ACETAMINOPHEN 650 MG RE SUPP: 650.0000 mg | RECTAL | Status: DC | PRN

## 2011-06-14 NOTE — Care Management Note (Signed)
    Page 1 of 1   06/16/2011     1:41:33 PM   CARE MANAGEMENT NOTE 06/16/2011  Patient:  [name], [name]   Account Number:  [id number]  Date Initiated:  06/14/2011  Documentation initiated by:  Rosemary Holms  Subjective/Objective Assessment:   Pt admitted with CVA symptoms. PTA lived at home with Significant Other who is in the room with pt. They state they were just at the [place] with same thing but they let him go.     Action/Plan:   Spoke to [facility]. Per Neurologist notes, pt had pain 9/10 headache and again on the 16th c/o of headache. Was scheduled for a MRI 06/14/11. [facility] not [facility] but cont...   Anticipated DC Date:  06/16/2011   Anticipated DC Plan:  HOME W HOME HEALTH SERVICES  In-house referral  Financial Counselor      DC Planning Services  CM consult  [facility] referrals / transfers      Choice offered to / List presented to:             Status of service:  Completed, signed off Medicare Important Message given?   (If response is "NO", the following Medicare IM given date fields will be blank) Date Medicare IM given:   Date Additional Medicare IM given:    Discharge Disposition:  HOME/SELF CARE  Per UR Regulation:    If discussed at Long Length of Stay Meetings, dates discussed:    Comments:  our finance dept should advise the [address] of admission. Lubertha Basque and Bonita Quin notified. [facility] faxed CM a form for pt to decide to stay at [facility] or elect transfer to [place]. If pt electes to stay at [facility], he will have less if any VA benefits. If he elects to transfer, he will have a greater benefit and the stay will be cheaper for pt. This was explained to pt, significant other and cousin. They will talk with pt's sisters and let me know by the end of the day. CM offered a telephone number for them to call to clarify information or obtain information. They declined. CM told them she iwll be here and available. Rosemary Holms RN BSN CM 06/14/11 1200   06/14/11 1613  Andrew Pohle Leanord Hawking RN BSN CM Pt sign [facility] form electing to transfer to a [facility] facility. Records faxed. Will wait word from [place].  06/16/11 1300 Demaris Leavell Leanord Hawking RN BSN CM No contact received from [facility]. CM will fax DC Summary.

## 2011-06-14 NOTE — Consult Note (Signed)
Reason for Consult: Referring Physician:   [name] is an 51 y.o. male.  HPI:  Past Medical History  Diagnosis Date  . Hypertension   . Hepatitis C   . Cirrhosis of liver   . Depression   . Gallstones   . Chronic back pain   . Left leg paresthesias   . Vertigo   . Hypercholesteremia   . Chronic neck pain   . Headache     Past Surgical History  Procedure Date  . Orthopedic surgery     "on my legs"    Family History  Problem Relation Age of Onset  . Diabetes Brother   . Diabetes Sister   . Diabetes Father   . Diabetes Mother   . Hypertension Mother   . Hypertension Father   . Hypertension Sister   . Hypertension Brother     Social History:  reports that he quit smoking about 2 years ago. He does not have any smokeless tobacco history on file. He reports that he does not drink alcohol or use illicit drugs.  Allergies:  Allergies  Allergen Reactions  . Bee Venom Anaphylaxis and Swelling    Medications:  Prior to Admission medications   Medication Sig Start Date End Date Taking? Authorizing Provider  amLODipine (NORVASC) 10 MG tablet Take 5 mg by mouth daily.   Yes Historical Provider, MD  aspirin 81 MG chewable tablet Chew 81 mg by mouth daily.   Yes Historical Provider, MD  Carboxymethylcell-Hypromellose (GENTEAL) 0.25-0.3 % GEL Apply 1 application to eye 2 (two) times daily. 0.3% one application to both eyes twice a day for ocular dryness   Yes Historical Provider, MD  cetirizine (ZYRTEC) 10 MG tablet Take 10 mg by mouth daily.   Yes Historical Provider, MD  cholecalciferol (VITAMIN D) 1000 UNITS tablet Take 1,000 Units by mouth daily.   Yes Historical Provider, MD  fish oil-omega-3 fatty acids 1000 MG capsule Take 3 g by mouth 2 (two) times daily.   Yes Historical Provider, MD  gabapentin (NEURONTIN) 300 MG capsule Take 600 mg by mouth 3 (three) times daily.   Yes Historical Provider, MD  hydrochlorothiazide (HYDRODIURIL) 25 MG tablet Take 25 mg by  mouth daily.   Yes Historical Provider, MD  lisinopril (PRINIVIL,ZESTRIL) 40 MG tablet Take 40 mg by mouth daily.   Yes Historical Provider, MD  meclizine (ANTIVERT) 25 MG tablet Take 25 mg by mouth 3 (three) times daily as needed.   Yes Historical Provider, MD  QUEtiapine (SEROQUEL) 100 MG tablet Take 50 mg by mouth at bedtime.   Yes Historical Provider, MD  sertraline (ZOLOFT) 100 MG tablet Take 200 mg by mouth every morning.   Yes Historical Provider, MD  traMADol (ULTRAM) 50 MG tablet Take 50 mg by mouth 2 (two) times daily as needed.   Yes Historical Provider, MD  traZODone (DESYREL) 100 MG tablet Take 200 mg by mouth at bedtime.   Yes Historical Provider, MD   Scheduled Meds:   . aspirin  300 mg Rectal Daily   Or  . aspirin  325 mg Oral Daily  . enoxaparin  40 mg Subcutaneous Q24H  . gabapentin  600 mg Oral TID  . lisinopril  10 mg Oral Daily  . sodium chloride      . DISCONTD: lisinopril  40 mg Oral Daily   Continuous Infusions:   . DISCONTD: sodium chloride 100 mL/hr at 06/13/11 2034  . DISCONTD: sodium chloride 0.9 % 1,000 mL with potassium  chloride 20 mEq infusion     PRN Meds:.acetaminophen, acetaminophen, fentaNYL, senna-docusate, traMADol   Results for orders placed during the hospital encounter of 06/13/11 (from the past 48 hour(s))  BASIC METABOLIC PANEL     Status: Abnormal   Collection Time   06/13/11  8:15 PM      Component Value Range Comment   Sodium 140  135 - 145 (mEq/L)    Potassium 3.4 (*) 3.5 - 5.1 (mEq/L)    Chloride 103  96 - 112 (mEq/L)    CO2 29  19 - 32 (mEq/L)    Glucose, Bld 92  70 - 99 (mg/dL)    BUN 14  6 - 23 (mg/dL)    Creatinine, Ser 4.54  0.50 - 1.35 (mg/dL)    Calcium 9.4  8.4 - 10.5 (mg/dL)    GFR calc non Af Amer 83 (*) >90 (mL/min)    GFR calc Af Amer >90  >90 (mL/min)   CBC     Status: Abnormal   Collection Time   06/13/11  8:15 PM      Component Value Range Comment   WBC 7.1  4.0 - 10.5 (K/uL)    RBC 4.75  4.22 - 5.81 (MIL/uL)      Hemoglobin 13.9  13.0 - 17.0 (g/dL)    HCT 09.8  11.9 - 14.7 (%)    MCV 86.5  78.0 - 100.0 (fL)    MCH 29.3  26.0 - 34.0 (pg)    MCHC 33.8  30.0 - 36.0 (g/dL)    RDW 82.9  56.2 - 13.0 (%)    Platelets 96 (*) 150 - 400 (K/uL)   DIFFERENTIAL     Status: Abnormal   Collection Time   06/13/11  8:15 PM      Component Value Range Comment   Neutrophils Relative 31 (*) 43 - 77 (%)    Neutro Abs 2.2  1.7 - 7.7 (K/uL)    Lymphocytes Relative 55 (*) 12 - 46 (%)    Lymphs Abs 3.9  0.7 - 4.0 (K/uL)    Monocytes Relative 8  3 - 12 (%)    Monocytes Absolute 0.6  0.1 - 1.0 (K/uL)    Eosinophils Relative 6 (*) 0 - 5 (%)    Eosinophils Absolute 0.4  0.0 - 0.7 (K/uL)    Basophils Relative 0  0 - 1 (%)    Basophils Absolute 0.0  0.0 - 0.1 (K/uL)   TROPONIN I     Status: Normal   Collection Time   06/13/11  8:15 PM      Component Value Range Comment   Troponin I <0.30  <0.30 (ng/mL)   ETHANOL     Status: Normal   Collection Time   06/13/11  8:15 PM      Component Value Range Comment   Alcohol, Ethyl (B) <11  0 - 11 (mg/dL)   URINALYSIS, ROUTINE W REFLEX MICROSCOPIC     Status: Abnormal   Collection Time   06/13/11  8:47 PM      Component Value Range Comment   Color, Urine YELLOW  YELLOW     APPearance CLEAR  CLEAR     Specific Gravity, Urine 1.025  1.005 - 1.030     pH 6.5  5.0 - 8.0     Glucose, UA NEGATIVE  NEGATIVE (mg/dL)    Hgb urine dipstick NEGATIVE  NEGATIVE     Bilirubin Urine NEGATIVE  NEGATIVE     Ketones, ur NEGATIVE  NEGATIVE (mg/dL)    Protein, ur NEGATIVE  NEGATIVE (mg/dL)    Urobilinogen, UA >8.6 (*) 0.0 - 1.0 (mg/dL)    Nitrite NEGATIVE  NEGATIVE     Leukocytes, UA NEGATIVE  NEGATIVE  MICROSCOPIC NOT DONE ON URINES WITH NEGATIVE PROTEIN, BLOOD, LEUKOCYTES, NITRITE, OR GLUCOSE <1000 mg/dL.  URINE RAPID DRUG SCREEN (HOSP PERFORMED)     Status: Normal   Collection Time   06/13/11  8:47 PM      Component Value Range Comment   Opiates NONE DETECTED  NONE DETECTED     Cocaine  NONE DETECTED  NONE DETECTED     Benzodiazepines NONE DETECTED  NONE DETECTED     Amphetamines NONE DETECTED  NONE DETECTED     Tetrahydrocannabinol NONE DETECTED  NONE DETECTED     Barbiturates NONE DETECTED  NONE DETECTED    HEPATIC FUNCTION PANEL     Status: Abnormal   Collection Time   06/13/11  8:50 PM      Component Value Range Comment   Total Protein 7.1  6.0 - 8.3 (g/dL)    Albumin 3.3 (*) 3.5 - 5.2 (g/dL)    AST 71 (*) 0 - 37 (U/L)    ALT 62 (*) 0 - 53 (U/L)    Alkaline Phosphatase 153 (*) 39 - 117 (U/L)    Total Bilirubin 0.7  0.3 - 1.2 (mg/dL)    Bilirubin, Direct 0.3  0.0 - 0.3 (mg/dL)    Indirect Bilirubin 0.4  0.3 - 0.9 (mg/dL)   AMMONIA     Status: Normal   Collection Time   06/13/11  8:53 PM      Component Value Range Comment   Ammonia 55  11 - 60 (umol/L)   PROTIME-INR     Status: Abnormal   Collection Time   06/14/11 12:00 AM      Component Value Range Comment   Prothrombin Time 16.0 (*) 11.6 - 15.2 (seconds)    INR 1.25  0.00 - 1.49    APTT     Status: Normal   Collection Time   06/14/11 12:00 AM      Component Value Range Comment   aPTT 34  24 - 37 (seconds)   HEMOGLOBIN A1C     Status: Abnormal   Collection Time   06/14/11  2:30 AM      Component Value Range Comment   Hemoglobin A1C 5.8 (*) <5.7 (%)    Mean Plasma Glucose 120 (*) <117 (mg/dL)   LIPID PANEL     Status: Abnormal   Collection Time   06/14/11  4:43 AM      Component Value Range Comment   Cholesterol 184  0 - 200 (mg/dL)    Triglycerides 578 (*) <150 (mg/dL)    HDL 27 (*) >46 (mg/dL)    Total CHOL/HDL Ratio 6.8      VLDL 39  0 - 40 (mg/dL)    LDL Cholesterol 962 (*) 0 - 99 (mg/dL)     Dg Eye Foreign Body  06/14/2011  *RADIOLOGY REPORT*  Clinical Data: Metal in eyes, previously removed, MRI clearance  ORBITS FOR FOREIGN BODY - 2 VIEW  Comparison: None  Findings: No orbital metallic foreign body. Hypoplastic right frontal sinus. Visualized bones sinuses otherwise unremarkable.  IMPRESSION:  No orbital metallic foreign body.  Original Report Authenticated By: Lollie Marrow, M.D.   Dg Chest 2 View  06/13/2011  *RADIOLOGY REPORT*  Clinical Data: Altered mental status, weakness.  CHEST - 2  VIEW  Comparison: 04/14/2008  Findings: Heart size upper normal to mildly enlarged.  Mild mediastinal prominence may be exaggerated by hypoaeration. Interstitial prominence and minimal lung base atelectasis, otherwise without focal consolidation, pleural effusion, or pneumothorax.  No acute osseous finding.  IMPRESSION: Heart size upper normal to mildly enlarged, with interstitial prominence. Minimal linear lung base opacities, likely atelectasis. Otherwise, no focal consolidation.  Original Report Authenticated By: Waneta Martins, M.D.   Ct Head Wo Contrast  06/13/2011  *RADIOLOGY REPORT*  Clinical Data: Slurred speech, weakness.  CT HEAD WITHOUT CONTRAST  Technique:  Contiguous axial images were obtained from the base of the skull through the vertex without contrast.  Comparison: 04/14/2008  Findings: Focal hypoattenuation along the right external capsule is unchanged, perhaps a prior lacunar infarction. There is no evidence for acute hemorrhage, hydrocephalus, mass lesion, or abnormal extra- axial fluid collection.  No definite CT evidence for acute infarction.  The visualized paranasal sinuses and mastoid air cells are predominately clear.  IMPRESSION: No acute intracranial abnormality.  Original Report Authenticated By: Waneta Martins, M.D.   Mr [facility] Wo Contrast  06/14/2011  *RADIOLOGY REPORT*  Clinical Data:  Slurred speech.  Weakness. Hypertension. Hypercholesterolemia.  MRI HEAD WITHOUT CONTRAST MRA HEAD WITHOUT CONTRAST  Technique: Multiplanar, multiecho pulse sequences of the brain and surrounding structures were obtained according to standard protocol without intravenous contrast.  Angiographic images of the head were obtained using MRA technique without contrast.  Comparison: 06/13/2011  CT.  No comparison MR.  MRI HEAD  Findings:  No acute infarct.  Artifact extends through the pons.  No intracranial hemorrhage.  Scattered punctate and patchy nonspecific white matter type changes probably related to result of small vessel disease in this hypertensive patient.  No hydrocephalus.  No intracranial mass lesion detected on this unenhanced exam.  Minimal paranasal sinus mucosal thickening.  IMPRESSION: No acute infarct.  Mild small vessel disease type changes.  MRA HEAD  Findings: Ectatic distal vertical cervical segment and petrous segment of the internal carotid artery bilaterally.  Ectasia with areas of mild to moderate narrowing involving portions of the cavernous and supraclinoid segment of the internal carotid artery bilaterally.  Fetal type origin of the posterior cerebral artery bilaterally.  Hypoplastic A1 segment of the right anterior cerebral artery.  Moderate to marked stenosis of the middle cerebral artery branch vessels more notable on the right.  Poor delineation of the PICA bilaterally.  Mild to slightly moderate narrowing distal aspect of the right vertebral artery with mild narrowing distal aspect of the left vertebral artery.  Ectatic basilar artery with mild narrowing and irregularity.  Nonvisualization left AICA.  Irregularity and narrowing of the superior cerebellar artery and posterior cerebral artery bilaterally with moderate to marked tandem stenoses right posterior cerebral artery.  No aneurysm or vascular malformation detected on this slightly motion degraded exam.  IMPRESSION: Intracranial atherosclerotic type changes as detailed above.  Original Report Authenticated By: Fuller Canada, M.D.   Mr Brain Wo Contrast  06/14/2011  *RADIOLOGY REPORT*  Clinical Data:  Slurred speech.  Weakness. Hypertension. Hypercholesterolemia.  MRI HEAD WITHOUT CONTRAST MRA HEAD WITHOUT CONTRAST  Technique: Multiplanar, multiecho pulse sequences of the brain and surrounding structures were  obtained according to standard protocol without intravenous contrast.  Angiographic images of the head were obtained using MRA technique without contrast.  Comparison: 06/13/2011 CT.  No comparison MR.  MRI HEAD  Findings:  No acute infarct.  Artifact extends through the pons.  No intracranial hemorrhage.  Scattered punctate and patchy nonspecific white matter type changes probably related to result of small vessel disease in this hypertensive patient.  No hydrocephalus.  No intracranial mass lesion detected on this unenhanced exam.  Minimal paranasal sinus mucosal thickening.  IMPRESSION: No acute infarct.  Mild small vessel disease type changes.  MRA HEAD  Findings: Ectatic distal vertical cervical segment and petrous segment of the internal carotid artery bilaterally.  Ectasia with areas of mild to moderate narrowing involving portions of the cavernous and supraclinoid segment of the internal carotid artery bilaterally.  Fetal type origin of the posterior cerebral artery bilaterally.  Hypoplastic A1 segment of the right anterior cerebral artery.  Moderate to marked stenosis of the middle cerebral artery branch vessels more notable on the right.  Poor delineation of the PICA bilaterally.  Mild to slightly moderate narrowing distal aspect of the right vertebral artery with mild narrowing distal aspect of the left vertebral artery.  Ectatic basilar artery with mild narrowing and irregularity.  Nonvisualization left AICA.  Irregularity and narrowing of the superior cerebellar artery and posterior cerebral artery bilaterally with moderate to marked tandem stenoses right posterior cerebral artery.  No aneurysm or vascular malformation detected on this slightly motion degraded exam.  IMPRESSION: Intracranial atherosclerotic type changes as detailed above.  Original Report Authenticated By: Fuller Canada, M.D.    Review of Systems  Constitutional: Negative.   Eyes: Positive for blurred vision.  Respiratory:  Positive for shortness of breath.   Cardiovascular: Positive for chest pain.  Gastrointestinal: Positive for heartburn, nausea and vomiting.  Genitourinary: Negative.   Musculoskeletal: Negative.   Neurological: Positive for headaches.  Endo/Heme/Allergies: Negative.   Psychiatric/Behavioral: Negative.    Blood pressure 157/91, pulse 55, temperature 98.3 F (36.8 C), temperature source Oral, resp. rate 20, height 6\' 2"  (1.88 m), weight 104.5 kg (230 lb 6.1 oz), SpO2 96.00%. Physical Exam  Assessment/Plan: See dict  Charlena Haub 06/14/2011, 7:11 PM
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
## 2011-06-14 NOTE — ED Notes (Signed)
Report given to [facility] on 300

## 2011-06-14 NOTE — Consult Note (Signed)
[name], [name]            ACCOUNT NO.:  [id number]  MEDICAL RECORD NO.:  [id number]  LOCATION:  A305                          FACILITY:  [facility]  PHYSICIAN:  Delphin Funes A. Gerilyn Pilgrim, M.D. DATE OF BIRTH:  22-Mar-1960  DATE OF CONSULTATION: DATE OF DISCHARGE:                                CONSULTATION   HISTORY OF PRESENT ILLNESS:  The patient is a 51 year old right-handed black male, who does have baseline headaches as a longstanding issue. The patient also has chronic pain problems and has seen Pain Management. He goes to [place] for most of his healthcare, and this is the [facility] [place] in [place].  The patient has had episodic headaches over the past 3 days. His headaches have been quite severe.  The location involves the frontal and occipital areas bilaterally.  He does have photophobia, phonophobia, nausea, and sometimes emesis.  The patient reports that he has been given over-the-counter medication, which has not been very beneficial. He apparently was in process of having these severe headaches worked up, but decided to come to the emergency room because his headaches were so severe.  He furthermore has had facial droop on the right side and he reports dropping things on the left side as the recent finding resulted in the patient coming to the ER.  The patient presented yesterday and still has similar symptoms.  There were some reports of slurred speech. He does have a long history of hypertension.  He is on triple medications for BP control.  These were held by Tele-neurology because of the concern of acute stroke.  Initial head CT scan was negative.  The patient's girlfriend is concerned of the patient not breathing well at nighttime.  She reports that he quits breathing.  He snores loudly.  He also had chronic chest discomfort associated with burning in retrosternal area and has dyspnea on exertion.  PHYSICAL EXAMINATION:  GENERAL:  Overweight man, in no acute distress. The room is  closed and the lights are out.  NECK:  Supple. HEAD:  Normocephalic, atraumatic. ABDOMEN:  Obese, but soft. EXTREMITIES:  No swelling or edema. MENTATION:  He is awake and alert.  Speech, language, and cognition are intact. CRANIAL NERVES:  The right pupil is 5-mm and the left is 4.  Both are briskly reactive.  Seemed to have anisocoria.  Extraocular movements are intact.  Does seem to have mild cataracts in the right eye.  Visual fields are intact.  Facial muscle strength shows slight flattening of nasolabial fold on the right, although he has good strength on testing; however, tongue is midline.  Uvula midline.  He does have a crowded posterior airspace and the reddened uvula indicating that the patient likely has apnea.  He does have Tinel sign at the occipital area bilaterally, more on the right side.  Motor examination shows normal tone, bulk, and strength.  No pronator drift noted.  Coordination shows normal finger-to-nose.  No dysmetria.  No tremors.  No parkinsonism. Reflexes 2+.  Plantars are both flexor.  Sensation normal to temperature and light touch.  Brain MRI reviewed in person and does show scattered tiny frontal hyperintensity, that is nonspecific.  No orientation.  Worrisome for demyelinating processes.  Nothing acute seen on diffusion imaging.  No blood seen on echo gradient sequence.  MRA is essentially unremarkable.  ASSESSMENT: 1. I suspect the patient likely has complicated migraines.  Initial     workup was worrisome for an ischemic stroke, possibly transient     ischemic attack, but I think this is unlikely. 2. Hypertension. 3. Likely significant obstructive sleep apnea syndrome. 4. Anisocoria, possible physiologic.  RECOMMENDATIONS:  He has an echo and carotid pending.  I suggest that the patient be evaluated for obstructive sleep apnea syndrome.  This can be done in an outpatient setting.  Certainly, if he has this, this will affect hypertension control  and can cause poor headache control. Regarding the symptomatic treatment, I am going to suggest bolus of Solu- Medrol 500 mg for the next couple days, also try Fioricet for more symptomatic relief right now.  I would restart his BP medications, especially if carotids are negative.  I will continue to follow this patient.  Thanks for this consultation.     Lukka Black A. Gerilyn Pilgrim, M.D.     KAD/MEDQ  D:  06/14/2011  T:  06/14/2011  Job:  [id number]

## 2011-06-14 NOTE — Progress Notes (Signed)
UR chart review completed. [name]  

## 2011-06-14 NOTE — Progress Notes (Signed)
Subjective: This man was admitted to yesterday night with diagnosis of new CVA clinically. He has had slurred speech and facial palsy. His symptoms actually started a few days ago and he was seen at the [facility] but apparently was not admitted.           Physical Exam: Blood pressure 180/111, pulse 56, temperature 97.6 F (36.4 C), temperature source Oral, resp. rate 20, height 6\' 2"  (1.88 m), weight 104.5 kg (230 lb 6.1 oz), SpO2 96.00%. Looks systemically well. He is alert and orientated. His speech is slightly slurred now but not significantly so. He does not have any dysphagia. He does appear to have a right upper motor neuron facial palsy which is mild. He appears to have left arm weakness more than the right. Heart sounds are present and normal. There are no carotid bruits. Lung fields are clear.   Investigations:     Basic Metabolic Panel:  Basename 06/13/11 2015  NA 140  K 3.4*  CL 103  CO2 29  GLUCOSE 92  BUN 14  CREATININE 1.02  CALCIUM 9.4  MG --  PHOS --   Liver Function Tests:  Basename 06/13/11 2050  AST 71*  ALT 62*  ALKPHOS 153*  BILITOT 0.7  PROT 7.1  ALBUMIN 3.3*     CBC:  Basename 06/13/11 2015  WBC 7.1  NEUTROABS 2.2  HGB 13.9  HCT 41.1  MCV 86.5  PLT 96*    Dg Chest 2 View  06/13/2011  *RADIOLOGY REPORT*  Clinical Data: Altered mental status, weakness.  CHEST - 2 VIEW  Comparison: 04/14/2008  Findings: Heart size upper normal to mildly enlarged.  Mild mediastinal prominence may be exaggerated by hypoaeration. Interstitial prominence and minimal lung base atelectasis, otherwise without focal consolidation, pleural effusion, or pneumothorax.  No acute osseous finding.  IMPRESSION: Heart size upper normal to mildly enlarged, with interstitial prominence. Minimal linear lung base opacities, likely atelectasis. Otherwise, no focal consolidation.  Original Report Authenticated By: Waneta Martins, M.D.   Ct Head Wo  Contrast  06/13/2011  *RADIOLOGY REPORT*  Clinical Data: Slurred speech, weakness.  CT HEAD WITHOUT CONTRAST  Technique:  Contiguous axial images were obtained from the base of the skull through the vertex without contrast.  Comparison: 04/14/2008  Findings: Focal hypoattenuation along the right external capsule is unchanged, perhaps a prior lacunar infarction. There is no evidence for acute hemorrhage, hydrocephalus, mass lesion, or abnormal extra- axial fluid collection.  No definite CT evidence for acute infarction.  The visualized paranasal sinuses and mastoid air cells are predominately clear.  IMPRESSION: No acute intracranial abnormality.  Original Report Authenticated By: Waneta Martins, M.D.      Medications: I have reviewed the patient's current medications.  Impression: 1. Recent clinical CVA, workup pending. 2. Hypertension, currently uncontrolled. 3. Hepatitis C with cirrhosis of the liver. 4. Depression. 5. Hyperlipidemia.     Plan: 1. Try to reduce his blood pressure very gradually. Start lisinopril 10 mg daily. Titrate antihypertensive medications to try to avoid hypotension, increasing the risk of further stroke. 2. Stroke workup. 3. Await neurology consultation.     LOS: 1 day   Wilson Singer Pager [phone]  06/14/2011, 8:17 AM

## 2011-06-14 NOTE — Evaluation (Signed)
Speech Language Pathology Evaluation Patient Details Name: [name] MRN: [id number] DOB: Jul 11, 1960 Today's Date: 06/14/2011 Time: 4098-1191 SLP Time Calculation (min): 30 min  Problem List:  Patient Active Problem List  Diagnoses  . Hypertension  . Hepatitis C  . Cirrhosis of liver  . Depression  . Chronic back pain  . Left leg paresthesias  . Hypercholesteremia  . Chronic neck pain  . Headache  . CVA (cerebral infarction)   Past Medical History:  Past Medical History  Diagnosis Date  . Hypertension   . Hepatitis C   . Cirrhosis of liver   . Depression   . Gallstones   . Chronic back pain   . Left leg paresthesias   . Vertigo   . Hypercholesteremia   . Chronic neck pain   . Headache    Past Surgical History:  Past Surgical History  Procedure Date  . Orthopedic surgery     "on my legs"    Assessment / Plan / Recommendation Clinical Impression  Pt's clinical symptom manifestations appear inconsistent at times: Speech intelligibility is reduced (does appear to have [facility] facial palsy), decreased processing speed, and memory deficits which are all likely negatively impacted by fatigue and pain. Pt would benefit from skilled SLP intervention in the acute setting to maximize independence with cognitive linguistic skills.     SLP Assessment  Patient needs continued Speech Lanaguage Pathology Services    Follow Up Recommendations  Outpatient SLP    Frequency and Duration min 2x/week  1 week   Pertinent Vitals/Pain    SLP Goals  To be determined.  SLP Goals Potential to Achieve Goals: Good  SLP Evaluation Prior Functioning  Type of Home: House Lives With: Spouse Education: 12 grade education Vocation: Unemployed   Cognition  Overall Cognitive Status: Impaired Arousal/Alertness: Awake/alert Orientation Level: Oriented to person;Oriented to place;Oriented to situation;Disoriented to time (Stated date to be June 30, 2011 (06/14/2011)) Attention:  Focused Focused Attention: Impaired Focused Attention Impairment: Verbal complex;Functional complex Memory: Impaired Memory Impairment: Storage deficit;Retrieval deficit;Decreased recall of new information (Pt scored 0/12 with 4 item recall with cues.) Awareness: Appears intact Problem Solving: Impaired Problem Solving Impairment: Functional complex Executive Function: Self Monitoring Self Monitoring: Impaired Self Monitoring Impairment: Verbal complex;Functional complex Safety/Judgment:  (To be determined.)    Comprehension  Auditory Comprehension Overall Auditory Comprehension: Impaired Yes/No Questions: Within Functional Limits Commands: Within Functional Limits Interfering Components: Attention;Pain;Processing speed (fatigue) EffectiveTechniques: Extra processing time;Repetition Visual Recognition/Discrimination Discrimination: Within Function Limits Reading Comprehension Reading Status: Within funtional limits    Expression Expression Primary Mode of Expression: Verbal Verbal Expression Overall Verbal Expression: Impaired Initiation: No impairment Level of Generative/Spontaneous Verbalization: Conversation Repetition: No impairment Naming: Impairment Responsive: Other (comment) Confrontation: Within functional limits Divergent: 50-74% accurate (10 animals in 60 seconds, norm = 30+/- 9) Pragmatics: No impairment Interfering Components: Attention;Speech intelligibility Non-Verbal Means of Communication: Not applicable Written Expression Dominant Hand: Right (reports that he uses his left hand for a lot of thins too) Written Expression: Not tested   Oral / Motor Oral Motor/Sensory Function Overall Oral Motor/Sensory Function: Impaired Labial ROM: Reduced right Labial Symmetry: Abnormal symmetry right Labial Strength: Reduced Lingual ROM: Within Functional Limits Lingual Symmetry: Within Functional Limits Lingual Strength: Reduced Lingual Sensation: Within  Functional Limits Facial ROM: Reduced right Mandible: Within Functional Limits Motor Speech Overall Motor Speech: Impaired Phonation: Normal;Low vocal intensity Resonance: Within functional limits Articulation: Impaired Level of Impairment: Phrase Intelligibility: Intelligibility reduced Word: 75-100% accurate Phrase: 50-74% accurate Sentence: 50-74%  accurate Conversation: 50-74% accurate Motor Planning: Witnin functional limits Motor Speech Errors: Aware Effective Techniques: Pause     Andrei Mccook 06/14/2011, 4:59 PM

## 2011-06-14 NOTE — Progress Notes (Signed)
*  PRELIMINARY RESULTS* Echocardiogram 2D Echocardiogram has been performed.  [name] 06/14/2011, 2:28 PM

## 2011-06-15 ENCOUNTER — Inpatient Hospital Stay (HOSPITAL_COMMUNITY): Payer: Non-veteran care

## 2011-06-15 DIAGNOSIS — I10 Essential (primary) hypertension: Secondary | ICD-10-CM

## 2011-06-15 DIAGNOSIS — G8929 Other chronic pain: Secondary | ICD-10-CM

## 2011-06-15 DIAGNOSIS — G43809 Other migraine, not intractable, without status migrainosus: Secondary | ICD-10-CM

## 2011-06-15 DIAGNOSIS — F329 Major depressive disorder, single episode, unspecified: Secondary | ICD-10-CM

## 2011-06-15 DIAGNOSIS — F3289 Other specified depressive episodes: Secondary | ICD-10-CM

## 2011-06-15 LAB — COMPREHENSIVE METABOLIC PANEL
AST: 59 U/L — ABNORMAL HIGH (ref 0–37)
Albumin: 3.1 g/dL — ABNORMAL LOW (ref 3.5–5.2)
Calcium: 9.2 mg/dL (ref 8.4–10.5)
Chloride: 103 mEq/L (ref 96–112)
Creatinine, Ser: 0.92 mg/dL (ref 0.50–1.35)

## 2011-06-15 LAB — CBC
HCT: 43.5 % (ref 39.0–52.0)
Hemoglobin: 14.8 g/dL (ref 13.0–17.0)
MCH: 29.4 pg (ref 26.0–34.0)
MCHC: 34 g/dL (ref 30.0–36.0)
MCV: 86.3 fL (ref 78.0–100.0)
RBC: 5.04 MIL/uL (ref 4.22–5.81)

## 2011-06-15 LAB — CARDIAC PANEL(CRET KIN+CKTOT+MB+TROPI)
CK, MB: 2.7 ng/mL (ref 0.3–4.0)
Troponin I: <0.3 ng/mL (ref <0.30)

## 2011-06-15 LAB — URINE CULTURE
Colony Count: NO GROWTH
Culture: NO GROWTH

## 2011-06-15 MED ORDER — KETOROLAC TROMETHAMINE 30 MG/ML IJ SOLN
30.0000 mg | Freq: Once | INTRAMUSCULAR | Status: AC
  Administered 2011-06-15: 30 mg via INTRAVENOUS
  Filled 2011-06-15: qty 1

## 2011-06-15 MED ORDER — SODIUM CHLORIDE 0.9 % IJ SOLN
INTRAMUSCULAR | Status: AC
  Filled 2011-06-15: qty 3

## 2011-06-15 MED ORDER — NITROGLYCERIN 0.4 MG SL SUBL
SUBLINGUAL_TABLET | SUBLINGUAL | Status: AC
  Administered 2011-06-15: 0.4 mg
  Filled 2011-06-15: qty 25

## 2011-06-15 MED ORDER — ALPRAZOLAM 0.25 MG PO TABS
0.2500 mg | ORAL_TABLET | Freq: Three times a day (TID) | ORAL | Status: DC | PRN
  Administered 2011-06-15 (×2): 0.25 mg via ORAL
  Filled 2011-06-15 (×2): qty 1

## 2011-06-15 MED ORDER — POTASSIUM CHLORIDE CRYS ER 20 MEQ PO TBCR
40.0000 meq | EXTENDED_RELEASE_TABLET | Freq: Once | ORAL | Status: AC
  Administered 2011-06-15: 40 meq via ORAL
  Filled 2011-06-15: qty 2

## 2011-06-15 NOTE — Progress Notes (Signed)
[name], [name]            ACCOUNT NO.:  [id number]  MEDICAL RECORD NO.:  [id number]  LOCATION:  A305                          FACILITY:  [facility]  PHYSICIAN:  Vahe Pienta A. Gerilyn Pilgrim, M.D. DATE OF BIRTH:  1961-01-01  DATE OF PROCEDURE: DATE OF DISCHARGE:                                PROGRESS NOTE   He still reports that he has a headache that are episodic lasting about an hour or so sometimes, there seems to be some fluctuation, again the headache involves the frontal and occipital areas bilaterally.  He has gotten the first dose of Solu-Medrol today so far; this was given this morning, and has made a difference.  Apparently, the Fioricet also has made a different.  Today, he is awake and alert.  He is lucid and coherent.  Neck is supple.  He has symmetric facial muscles and normal limb strength throughout.  ASSESSMENT AND PLAN:  Likely complicated migraine headaches.  We are going to go ahead and add Toradol to see how he responds to this. Continue with the other medications.     Lyssa Hackley A. Gerilyn Pilgrim, M.D.     KAD/MEDQ  D:  06/15/2011  T:  06/15/2011  Job:  [id number]

## 2011-06-15 NOTE — Progress Notes (Signed)
Subjective: Patient complains of ongoing headache. He does not feel his weakness is any better. He is very tearful. Admits to feeling very depressed. Denies any suicidal ideations. Reports that he's been battling depression for a few years now. He is on an antidepressant which he does not feel has been benefiting him. He often feels very anxious.  Objective: Vital signs in last 24 hours: Temp:  [97.2 F (36.2 C)-98.3 F (36.8 C)] 97.9 F (36.6 C) (05/21 1408) Pulse Rate:  [53-67] 61  (05/21 1408) Resp:  [16-20] 20  (05/21 1408) BP: (146-172)/(82-91) 162/82 mmHg (05/21 1408) SpO2:  [95 %-98 %] 98 % (05/21 1408) Weight change:  Last BM Date: 06/14/11  Intake/Output from previous day: 05/20 0701 - 05/21 0700 In: 720 [P.O.:720] Out: -  Total I/O In: 600 [P.O.:600] Out: -    Physical Exam: General: Alert, awake, oriented x3, in no acute distress. HEENT: No bruits, no goiter. Heart: Regular rate and rhythm, without murmurs, rubs, gallops. Lungs: Clear to auscultation bilaterally. Abdomen: Soft, nontender, nondistended, positive bowel sounds. Extremities: No clubbing cyanosis or edema with positive pedal pulses. Neuro: Grossly intact, nonfocal.    Lab Results: Basic Metabolic Panel:  Basename 06/15/11 0514 06/13/11 2015  NA 138 140  K 3.3* 3.4*  CL 103 103  CO2 25 29  GLUCOSE 218* 92  BUN 13 14  CREATININE 0.92 1.02  CALCIUM 9.2 9.4  MG -- --  PHOS -- --   Liver Function Tests:  Basename 06/15/11 0514 06/13/11 2050  AST 59* 71*  ALT 57* 62*  ALKPHOS 174* 153*  BILITOT 0.6 0.7  PROT 7.2 7.1  ALBUMIN 3.1* 3.3*   No results found for this basename: LIPASE:2,AMYLASE:2 in the last 72 hours  Basename 06/13/11 2053  AMMONIA 55   CBC:  Basename 06/15/11 0514 06/13/11 2015  WBC 3.9* 7.1  NEUTROABS -- 2.2  HGB 14.8 13.9  HCT 43.5 41.1  MCV 86.3 86.5  PLT 95* 96*   Cardiac Enzymes:  Basename 06/13/11 2015  CKTOTAL --  CKMB --  CKMBINDEX --  TROPONINI  <0.30   BNP: No results found for this basename: PROBNP:3 in the last 72 hours D-Dimer: No results found for this basename: DDIMER:2 in the last 72 hours CBG: No results found for this basename: GLUCAP:6 in the last 72 hours Hemoglobin A1C:  Basename 06/14/11 0230  HGBA1C 5.8*   Fasting Lipid Panel:  Basename 06/14/11 0443  CHOL 184  HDL 27*  LDLCALC 118*  TRIG 194*  CHOLHDL 6.8  LDLDIRECT --   Thyroid Function Tests: No results found for this basename: TSH,T4TOTAL,FREET4,T3FREE,THYROIDAB in the last 72 hours Anemia Panel: No results found for this basename: VITAMINB12,FOLATE,FERRITIN,TIBC,IRON,RETICCTPCT in the last 72 hours Coagulation:  Basename 06/14/11  LABPROT 16.0*  INR 1.25   Urine Drug Screen: Drugs of Abuse     Component Value Date/Time   LABOPIA NONE DETECTED 06/13/2011 2047   COCAINSCRNUR NONE DETECTED 06/13/2011 2047   LABBENZ NONE DETECTED 06/13/2011 2047   AMPHETMU NONE DETECTED 06/13/2011 2047   THCU NONE DETECTED 06/13/2011 2047   LABBARB NONE DETECTED 06/13/2011 2047    Alcohol Level:  Basename 06/13/11 2015  ETH <11   Urinalysis:  Basename 06/13/11 2047  COLORURINE YELLOW  LABSPEC 1.025  PHURINE 6.5  GLUCOSEU NEGATIVE  HGBUR NEGATIVE  BILIRUBINUR NEGATIVE  KETONESUR NEGATIVE  PROTEINUR NEGATIVE  UROBILINOGEN >8.0*  NITRITE NEGATIVE  LEUKOCYTESUR NEGATIVE    Recent Results (from the past 240 hour(s))  URINE CULTURE  Status: Normal   Collection Time   06/13/11  8:47 PM      Component Value Range Status Comment   Specimen Description URINE, CLEAN CATCH   Final    Special Requests NONE   Final    Culture  Setup Time [id number]   Final    Colony Count NO GROWTH   Final    Culture NO GROWTH   Final    Report Status 06/15/2011 FINAL   Final     Studies/Results: Dg Eye Foreign Body  06/14/2011  *RADIOLOGY REPORT*  Clinical Data: Metal in eyes, previously removed, MRI clearance  ORBITS FOR FOREIGN BODY - 2 VIEW  Comparison: None   Findings: No orbital metallic foreign body. Hypoplastic right frontal sinus. Visualized bones sinuses otherwise unremarkable.  IMPRESSION: No orbital metallic foreign body.  Original Report Authenticated By: Lollie Marrow, M.D.   Dg Chest 2 View  06/13/2011  *RADIOLOGY REPORT*  Clinical Data: Altered mental status, weakness.  CHEST - 2 VIEW  Comparison: 04/14/2008  Findings: Heart size upper normal to mildly enlarged.  Mild mediastinal prominence may be exaggerated by hypoaeration. Interstitial prominence and minimal lung base atelectasis, otherwise without focal consolidation, pleural effusion, or pneumothorax.  No acute osseous finding.  IMPRESSION: Heart size upper normal to mildly enlarged, with interstitial prominence. Minimal linear lung base opacities, likely atelectasis. Otherwise, no focal consolidation.  Original Report Authenticated By: Waneta Martins, M.D.   Ct Head Wo Contrast  06/13/2011  *RADIOLOGY REPORT*  Clinical Data: Slurred speech, weakness.  CT HEAD WITHOUT CONTRAST  Technique:  Contiguous axial images were obtained from the base of the skull through the vertex without contrast.  Comparison: 04/14/2008  Findings: Focal hypoattenuation along the right external capsule is unchanged, perhaps a prior lacunar infarction. There is no evidence for acute hemorrhage, hydrocephalus, mass lesion, or abnormal extra- axial fluid collection.  No definite CT evidence for acute infarction.  The visualized paranasal sinuses and mastoid air cells are predominately clear.  IMPRESSION: No acute intracranial abnormality.  Original Report Authenticated By: Waneta Martins, M.D.   Mr [facility] Wo Contrast  06/14/2011  *RADIOLOGY REPORT*  Clinical Data:  Slurred speech.  Weakness. Hypertension. Hypercholesterolemia.  MRI HEAD WITHOUT CONTRAST MRA HEAD WITHOUT CONTRAST  Technique: Multiplanar, multiecho pulse sequences of the brain and surrounding structures were obtained according to standard protocol  without intravenous contrast.  Angiographic images of the head were obtained using MRA technique without contrast.  Comparison: 06/13/2011 CT.  No comparison MR.  MRI HEAD  Findings:  No acute infarct.  Artifact extends through the pons.  No intracranial hemorrhage.  Scattered punctate and patchy nonspecific white matter type changes probably related to result of small vessel disease in this hypertensive patient.  No hydrocephalus.  No intracranial mass lesion detected on this unenhanced exam.  Minimal paranasal sinus mucosal thickening.  IMPRESSION: No acute infarct.  Mild small vessel disease type changes.  MRA HEAD  Findings: Ectatic distal vertical cervical segment and petrous segment of the internal carotid artery bilaterally.  Ectasia with areas of mild to moderate narrowing involving portions of the cavernous and supraclinoid segment of the internal carotid artery bilaterally.  Fetal type origin of the posterior cerebral artery bilaterally.  Hypoplastic A1 segment of the right anterior cerebral artery.  Moderate to marked stenosis of the middle cerebral artery branch vessels more notable on the right.  Poor delineation of the PICA bilaterally.  Mild to slightly moderate narrowing distal aspect of the right vertebral  artery with mild narrowing distal aspect of the left vertebral artery.  Ectatic basilar artery with mild narrowing and irregularity.  Nonvisualization left AICA.  Irregularity and narrowing of the superior cerebellar artery and posterior cerebral artery bilaterally with moderate to marked tandem stenoses right posterior cerebral artery.  No aneurysm or vascular malformation detected on this slightly motion degraded exam.  IMPRESSION: Intracranial atherosclerotic type changes as detailed above.  Original Report Authenticated By: Fuller Canada, M.D.   Mr Brain Wo Contrast  06/14/2011  *RADIOLOGY REPORT*  Clinical Data:  Slurred speech.  Weakness. Hypertension. Hypercholesterolemia.  MRI HEAD  WITHOUT CONTRAST MRA HEAD WITHOUT CONTRAST  Technique: Multiplanar, multiecho pulse sequences of the brain and surrounding structures were obtained according to standard protocol without intravenous contrast.  Angiographic images of the head were obtained using MRA technique without contrast.  Comparison: 06/13/2011 CT.  No comparison MR.  MRI HEAD  Findings:  No acute infarct.  Artifact extends through the pons.  No intracranial hemorrhage.  Scattered punctate and patchy nonspecific white matter type changes probably related to result of small vessel disease in this hypertensive patient.  No hydrocephalus.  No intracranial mass lesion detected on this unenhanced exam.  Minimal paranasal sinus mucosal thickening.  IMPRESSION: No acute infarct.  Mild small vessel disease type changes.  MRA HEAD  Findings: Ectatic distal vertical cervical segment and petrous segment of the internal carotid artery bilaterally.  Ectasia with areas of mild to moderate narrowing involving portions of the cavernous and supraclinoid segment of the internal carotid artery bilaterally.  Fetal type origin of the posterior cerebral artery bilaterally.  Hypoplastic A1 segment of the right anterior cerebral artery.  Moderate to marked stenosis of the middle cerebral artery branch vessels more notable on the right.  Poor delineation of the PICA bilaterally.  Mild to slightly moderate narrowing distal aspect of the right vertebral artery with mild narrowing distal aspect of the left vertebral artery.  Ectatic basilar artery with mild narrowing and irregularity.  Nonvisualization left AICA.  Irregularity and narrowing of the superior cerebellar artery and posterior cerebral artery bilaterally with moderate to marked tandem stenoses right posterior cerebral artery.  No aneurysm or vascular malformation detected on this slightly motion degraded exam.  IMPRESSION: Intracranial atherosclerotic type changes as detailed above.  Original Report  Authenticated By: Fuller Canada, M.D.    Medications: Scheduled Meds:   . aspirin  300 mg Rectal Daily   Or  . aspirin  325 mg Oral Daily  . enoxaparin  40 mg Subcutaneous Q24H  . gabapentin  600 mg Oral TID  . ketorolac  30 mg Intravenous Once  . lisinopril  10 mg Oral Daily  . methylPREDNISolone (SOLU-MEDROL) injection  500 mg Intravenous Q24H  . nitroGLYCERIN      . sodium chloride      . sodium chloride       Continuous Infusions:  PRN Meds:.acetaminophen, acetaminophen, butalbital-acetaminophen-caffeine, senna-docusate, traMADol  Assessment/Plan:  Principal Problem:  *CVA (cerebral infarction) Active Problems:  Hypertension  Hepatitis C  Cirrhosis of liver  Depression  Chronic back pain  Left leg paresthesias  Hypercholesteremia  Chronic neck pain  Headache  Plan:  #1. Complicated migraine. Patient is being followed by neurology. It is not felt that his symptoms are related to a stroke. He was started on treatment with steroids and Fioricet for complicated migraines. We will defer further treatment to neurology. MRI is negative for acute stroke. Echo and carotid Dopplers are pending.  #2. Depression.  The patient has been taking Zoloft and Seroquel as an outpatient. He does not feel that he has had much benefit. He describes having significant depression. Difficulty getting out of bed in the morning, insomnia, being tearful and having frequent crying spells. We will asked for a tele psychiatry consult to help with medical management. It is possible that this may be contributing to most of his symptoms.  #3. Chest pain. EKG did not show any acute changes. We will check one set of cardiac markers. We will also give some Xanax as it may be secondary to anxiety.  #4. Hep C with cirrhosis. Stable.  #5. Plan will be to discharge home once medically stable.   LOS: 2 days   Aldora Perman [facility] Pager: [phone] 06/15/2011, 2:46 PM

## 2011-06-15 NOTE — Progress Notes (Signed)
Subjective: Interval History: Objective: Vital signs in last 24 hours: Temp:  [97.2 F (36.2 C)-98.3 F (36.8 C)] 97.2 F (36.2 C) (05/21 1610) Pulse Rate:  [53-60] 60  (05/21 0613) Resp:  [16-20] 16  (05/21 0613) BP: (146-172)/(82-101) 146/82 mmHg (05/21 0613) SpO2:  [96 %-98 %] 96 % (05/21 0613)  Intake/Output from previous day: 05/20 0701 - 05/21 0700 In: 720 [P.O.:720] Out: -  Intake/Output this shift: Total I/O In: 600 [P.O.:600] Out: -  Nutritional status: Cardiac    Lab Results:  [facility] 06/15/11 0514 06/13/11 2015  WBC 3.9* 7.1  HGB 14.8 13.9  HCT 43.5 41.1  PLT 95* 96*  NA 138 140  K 3.3* 3.4*  CL 103 103  CO2 25 29  GLUCOSE 218* 92  BUN 13 14  CREATININE 0.92 1.02  CALCIUM 9.2 9.4  LABA1C -- --   Lipid Panel  Basename 06/14/11 0443  CHOL 184  TRIG 194*  HDL 27*  CHOLHDL 6.8  VLDL 39  LDLCALC 960*    Studies/Results: Dg Eye Foreign Body  06/14/2011  *RADIOLOGY REPORT*  Clinical Data: Metal in eyes, previously removed, MRI clearance  ORBITS FOR FOREIGN BODY - 2 VIEW  Comparison: None  Findings: No orbital metallic foreign body. Hypoplastic right frontal sinus. Visualized bones sinuses otherwise unremarkable.  IMPRESSION: No orbital metallic foreign body.  Original Report Authenticated By: Lollie Marrow, M.D.   Dg Chest 2 View  06/13/2011  *RADIOLOGY REPORT*  Clinical Data: Altered mental status, weakness.  CHEST - 2 VIEW  Comparison: 04/14/2008  Findings: Heart size upper normal to mildly enlarged.  Mild mediastinal prominence may be exaggerated by hypoaeration. Interstitial prominence and minimal lung base atelectasis, otherwise without focal consolidation, pleural effusion, or pneumothorax.  No acute osseous finding.  IMPRESSION: Heart size upper normal to mildly enlarged, with interstitial prominence. Minimal linear lung base opacities, likely atelectasis. Otherwise, no focal consolidation.  Original Report Authenticated By: Waneta Martins,  M.D.   Ct Head Wo Contrast  06/13/2011  *RADIOLOGY REPORT*  Clinical Data: Slurred speech, weakness.  CT HEAD WITHOUT CONTRAST  Technique:  Contiguous axial images were obtained from the base of the skull through the vertex without contrast.  Comparison: 04/14/2008  Findings: Focal hypoattenuation along the right external capsule is unchanged, perhaps a prior lacunar infarction. There is no evidence for acute hemorrhage, hydrocephalus, mass lesion, or abnormal extra- axial fluid collection.  No definite CT evidence for acute infarction.  The visualized paranasal sinuses and mastoid air cells are predominately clear.  IMPRESSION: No acute intracranial abnormality.  Original Report Authenticated By: Waneta Martins, M.D.   Mr [facility] Wo Contrast  06/14/2011  *RADIOLOGY REPORT*  Clinical Data:  Slurred speech.  Weakness. Hypertension. Hypercholesterolemia.  MRI HEAD WITHOUT CONTRAST MRA HEAD WITHOUT CONTRAST  Technique: Multiplanar, multiecho pulse sequences of the brain and surrounding structures were obtained according to standard protocol without intravenous contrast.  Angiographic images of the head were obtained using MRA technique without contrast.  Comparison: 06/13/2011 CT.  No comparison MR.  MRI HEAD  Findings:  No acute infarct.  Artifact extends through the pons.  No intracranial hemorrhage.  Scattered punctate and patchy nonspecific white matter type changes probably related to result of small vessel disease in this hypertensive patient.  No hydrocephalus.  No intracranial mass lesion detected on this unenhanced exam.  Minimal paranasal sinus mucosal thickening.  IMPRESSION: No acute infarct.  Mild small vessel disease type changes.  MRA HEAD  Findings: Ectatic distal  vertical cervical segment and petrous segment of the internal carotid artery bilaterally.  Ectasia with areas of mild to moderate narrowing involving portions of the cavernous and supraclinoid segment of the internal carotid artery  bilaterally.  Fetal type origin of the posterior cerebral artery bilaterally.  Hypoplastic A1 segment of the right anterior cerebral artery.  Moderate to marked stenosis of the middle cerebral artery branch vessels more notable on the right.  Poor delineation of the PICA bilaterally.  Mild to slightly moderate narrowing distal aspect of the right vertebral artery with mild narrowing distal aspect of the left vertebral artery.  Ectatic basilar artery with mild narrowing and irregularity.  Nonvisualization left AICA.  Irregularity and narrowing of the superior cerebellar artery and posterior cerebral artery bilaterally with moderate to marked tandem stenoses right posterior cerebral artery.  No aneurysm or vascular malformation detected on this slightly motion degraded exam.  IMPRESSION: Intracranial atherosclerotic type changes as detailed above.  Original Report Authenticated By: Fuller Canada, M.D.   Mr Brain Wo Contrast  06/14/2011  *RADIOLOGY REPORT*  Clinical Data:  Slurred speech.  Weakness. Hypertension. Hypercholesterolemia.  MRI HEAD WITHOUT CONTRAST MRA HEAD WITHOUT CONTRAST  Technique: Multiplanar, multiecho pulse sequences of the brain and surrounding structures were obtained according to standard protocol without intravenous contrast.  Angiographic images of the head were obtained using MRA technique without contrast.  Comparison: 06/13/2011 CT.  No comparison MR.  MRI HEAD  Findings:  No acute infarct.  Artifact extends through the pons.  No intracranial hemorrhage.  Scattered punctate and patchy nonspecific white matter type changes probably related to result of small vessel disease in this hypertensive patient.  No hydrocephalus.  No intracranial mass lesion detected on this unenhanced exam.  Minimal paranasal sinus mucosal thickening.  IMPRESSION: No acute infarct.  Mild small vessel disease type changes.  MRA HEAD  Findings: Ectatic distal vertical cervical segment and petrous segment of the  internal carotid artery bilaterally.  Ectasia with areas of mild to moderate narrowing involving portions of the cavernous and supraclinoid segment of the internal carotid artery bilaterally.  Fetal type origin of the posterior cerebral artery bilaterally.  Hypoplastic A1 segment of the right anterior cerebral artery.  Moderate to marked stenosis of the middle cerebral artery branch vessels more notable on the right.  Poor delineation of the PICA bilaterally.  Mild to slightly moderate narrowing distal aspect of the right vertebral artery with mild narrowing distal aspect of the left vertebral artery.  Ectatic basilar artery with mild narrowing and irregularity.  Nonvisualization left AICA.  Irregularity and narrowing of the superior cerebellar artery and posterior cerebral artery bilaterally with moderate to marked tandem stenoses right posterior cerebral artery.  No aneurysm or vascular malformation detected on this slightly motion degraded exam.  IMPRESSION: Intracranial atherosclerotic type changes as detailed above.  Original Report Authenticated By: Fuller Canada, M.D.    Medications:  Scheduled Meds:   . aspirin  300 mg Rectal Daily   Or  . aspirin  325 mg Oral Daily  . enoxaparin  40 mg Subcutaneous Q24H  . gabapentin  600 mg Oral TID  . ketorolac  30 mg Intravenous Once  . lisinopril  10 mg Oral Daily  . methylPREDNISolone (SOLU-MEDROL) injection  500 mg Intravenous Q24H  . sodium chloride      . sodium chloride       Continuous Infusions:  PRN Meds:.acetaminophen, acetaminophen, butalbital-acetaminophen-caffeine, senna-docusate, traMADol   Assessment/Plan: See  dict   LOS: 2 days  Khai Arrona

## 2011-06-15 NOTE — Progress Notes (Signed)
Pt c/o of chest pain after ambulating to the bathroom and getting up off of toilet.  Stated pain was in left side of chest.  BP 162/82 P63.  Stat EKG ordered.  Nitro 0.4 mg administered.  Dr. York Pellant notified - present at bedside with pt.

## 2011-06-16 ENCOUNTER — Encounter (HOSPITAL_COMMUNITY): Payer: Self-pay | Admitting: Internal Medicine

## 2011-06-16 DIAGNOSIS — I10 Essential (primary) hypertension: Secondary | ICD-10-CM

## 2011-06-16 DIAGNOSIS — G43109 Migraine with aura, not intractable, without status migrainosus: Secondary | ICD-10-CM

## 2011-06-16 DIAGNOSIS — G43809 Other migraine, not intractable, without status migrainosus: Secondary | ICD-10-CM

## 2011-06-16 DIAGNOSIS — G8929 Other chronic pain: Secondary | ICD-10-CM

## 2011-06-16 DIAGNOSIS — F329 Major depressive disorder, single episode, unspecified: Secondary | ICD-10-CM

## 2011-06-16 HISTORY — DX: Migraine with aura, not intractable, without status migrainosus: G43.109

## 2011-06-16 MED ORDER — CARBOXYMETHYLCELL-HYPROMELLOSE 0.25-0.3 % OP GEL: 1.0000 "application " | Freq: Two times a day (BID) | OPHTHALMIC | Status: DC

## 2011-06-16 MED ORDER — BUTALBITAL-APAP-CAFFEINE 50-325-40 MG PO TABS: 2.0000 | ORAL_TABLET | Freq: Four times a day (QID) | ORAL | Status: DC | PRN

## 2011-06-16 MED ORDER — QUETIAPINE FUMARATE 25 MG PO TABS: 150.0000 mg | ORAL_TABLET | Freq: Every day | ORAL | Status: DC

## 2011-06-16 MED ORDER — OMEGA-3 FATTY ACIDS 1000 MG PO CAPS
3.0000 g | ORAL_CAPSULE | Freq: Two times a day (BID) | ORAL | Status: DC
  Filled 2011-06-16: qty 3

## 2011-06-16 MED ORDER — OMEGA-3-ACID ETHYL ESTERS 1 G PO CAPS
3.0000 g | ORAL_CAPSULE | Freq: Two times a day (BID) | ORAL | Status: DC
  Administered 2011-06-16: 3 g via ORAL
  Filled 2011-06-16: qty 3

## 2011-06-16 MED ORDER — SERTRALINE HCL 50 MG PO TABS
200.0000 mg | ORAL_TABLET | Freq: Every morning | ORAL | Status: DC
  Administered 2011-06-16: 200 mg via ORAL
  Filled 2011-06-16: qty 4

## 2011-06-16 MED ORDER — IBUPROFEN 800 MG PO TABS
800.0000 mg | ORAL_TABLET | Freq: Three times a day (TID) | ORAL | Status: DC
  Administered 2011-06-16: 800 mg via ORAL
  Filled 2011-06-16: qty 1

## 2011-06-16 MED ORDER — POLYVINYL ALCOHOL 1.4 % OP SOLN
1.0000 [drp] | Freq: Two times a day (BID) | OPHTHALMIC | Status: DC
  Administered 2011-06-16: 1 [drp] via OPHTHALMIC
  Filled 2011-06-16: qty 15

## 2011-06-16 MED ORDER — TRAZODONE HCL 50 MG PO TABS: 200.0000 mg | ORAL_TABLET | Freq: Every day | ORAL | Status: DC

## 2011-06-16 MED ORDER — LORATADINE 10 MG PO TABS
10.0000 mg | ORAL_TABLET | Freq: Every day | ORAL | Status: DC
  Administered 2011-06-16: 10 mg via ORAL
  Filled 2011-06-16: qty 1

## 2011-06-16 NOTE — Progress Notes (Signed)
NAMEGIANN, [name]            ACCOUNT NO.:  [id number]  MEDICAL RECORD NO.:  [id number]  LOCATION:  A305                          FACILITY:  [facility]  PHYSICIAN:  Darneisha Windhorst A. Gerilyn Pilgrim, M.D. DATE OF BIRTH:  [birth year]  DATE OF PROCEDURE:  06/16/2011 DATE OF DISCHARGE:                                PROGRESS NOTE   HISTORY OF PRESENT ILLNESS:  The patient reports that he had headache all day long.  It is unclear if he responded to the Toradol as he just got 1 dose.  It appears; however, that he has responded to the Fioricet. He did have a headache today and the Fioricet has helped.  His headaches are only mild this morning.  He has taken Ultram in the past and reports this has not been beneficial.  He continues to have mild flattening of nasolabial fold on the right, apparently this is new, although imaging has been negative.  PHYSICAL EXAMINATION:  GENERAL:  He is awake and alert.  He is lucid and coherent.  Speech, language, and cognition are intact. HEENT:  Again, there is mild flattening of the nasolabial fold on the right.  Tongue is midline.  Uvula midline.  Visual fields are intact. Extraocular movements are full. NEUROLOGIC:  Facial muscle on direct strength is actually normal.  No nystagmus is seen.  Pupils are 4 mm and reactive.  He has good strength 5/5 bilaterally.  IMAGING:  Echo shows ejection fraction 55 to 60%.  ASSESSMENT AND PLAN: 1. I think this patient likely has a complicated migraine.  He has     responded better today to medications.  He has received 2 doses of     Solu-Medrol and overall seems better today.  With the Fioricet, I     think he responded well to this medication.  I think it is okay for     the patient be discharged.  He will be given the Fioricet.  I am     also giving Motrin 800 to be taken for his symptoms. 2. Likely severe obstructive sleep apnea syndrome, this needs to be     addressed or if the patient's headaches were to persist.  It is    also important for risk factor reduction for stroke reduction and     also cardiovascular reduction.  He is to continue with other     medications for blood pressure control.  I will suggest that the     patient have a sleep study done at the [place], given that he is     currently uninsured.  We will have the patient follow up with [place] in     1 month to check and see how things are going.     Diesha Rostad A. Gerilyn Pilgrim, M.D.    KAD/MEDQ  D:  06/16/2011  T:  06/16/2011  Job:  [id number]

## 2011-06-16 NOTE — Evaluation (Signed)
Occupational Therapy Evaluation Patient Details Name: [name] MRN: [id number] DOB: 02/12/1960 Today's Date: 06/16/2011 Time: 5621-3086 OT Time Calculation (min): 15 min  OT Assessment / Plan / Recommendation Clinical Impression  A:  Patient presents with BUE AROM and strength and sensation WFL, although has complaints of weakness in his left hand.    OT Assessment  Patient does not need any further OT services    Follow Up Recommendations  No OT follow up    Barriers to Discharge      Equipment Recommendations  None recommended by OT    Recommendations for Other Services    Frequency       Precautions / Restrictions Precautions Precautions: None Restrictions Weight Bearing Restrictions: No       ADL  Grooming: Simulated;Wash/dry face;Independent Lower Body Dressing: Performed;Independent Where Assessed - Lower Body Dressing: Unsupported sitting    OT Goals  N/A  Visit Information  Last OT Received On: 06/16/11    Subjective Data  Subjective: S:  For the past few months I have been dropping things out of my left hand. Patient Stated Goal: Get my left hand stronger   Prior Functioning  Home Living Lives With: Spouse Type of Home: House Home Access: Stairs to enter Entrance Stairs-Rails: Right Bathroom Toilet: Standard Home Adaptive Equipment: Straight cane Prior Function Level of Independence: Independent Able to Take Stairs?: Yes Driving: No Vocation: Unemployed Communication Communication: Expressive difficulties Dominant Hand: Right    Cognition  Overall Cognitive Status: Appears within functional limits for tasks assessed/performed Orientation Level: Appears intact for tasks assessed Behavior During Session: [facility] for tasks performed    Extremity/Trunk Assessment Right Upper Extremity Assessment RUE ROM/Strength/Tone: Within functional levels RUE Sensation: WFL - Light Touch;WFL - Proprioception RUE Coordination: WFL - gross/fine  motor Left Upper Extremity Assessment LUE ROM/Strength/Tone: Within functional levels LUE Sensation: WFL - Light Touch;WFL - Proprioception LUE Coordination: WFL - gross/fine motor                End of Session OT - End of Session Activity Tolerance: Patient tolerated treatment well Patient left: in bed   Shirlean Mylar, OTR/L  06/16/2011, 9:33 AM

## 2011-06-16 NOTE — Discharge Summary (Signed)
Physician Discharge Summary  Patient ID: [name] MRN: [id number] DOB/AGE: 05-13-1960 51 y.o.  Admit date: 06/13/2011 Discharge date: 06/16/2011  Primary Care Physician:  No primary provider on file.   Discharge Diagnoses:    Principal Problem:  *Complicated migraine Active Problems:  Hypertension  Hepatitis C  Cirrhosis of liver  Depression  Chronic back pain  Left leg paresthesias  Hypercholesteremia  Chronic neck pain  Headache    Medication List  As of 06/16/2011 11:11 AM   TAKE these medications         amLODipine 10 MG tablet   Commonly known as: NORVASC   Take 5 mg by mouth daily.      aspirin 81 MG chewable tablet   Chew 81 mg by mouth daily.      butalbital-acetaminophen-caffeine 50-325-40 MG per tablet   Commonly known as: FIORICET, ESGIC   Take 2 tablets by mouth every 6 (six) hours as needed for headache.      cetirizine 10 MG tablet   Commonly known as: ZYRTEC   Take 10 mg by mouth daily.      cholecalciferol 1000 UNITS tablet   Commonly known as: VITAMIN D   Take 1,000 Units by mouth daily.      fish oil-omega-3 fatty acids 1000 MG capsule   Take 3 g by mouth 2 (two) times daily.      gabapentin 300 MG capsule   Commonly known as: NEURONTIN   Take 600 mg by mouth 3 (three) times daily.      GENTEAL 0.25-0.3 % Gel   Generic drug: Carboxymethylcell-Hypromellose   Apply 1 application to eye 2 (two) times daily. 0.3% one application to both eyes twice a day for ocular dryness      hydrochlorothiazide 25 MG tablet   Commonly known as: HYDRODIURIL   Take 25 mg by mouth daily.      lisinopril 40 MG tablet   Commonly known as: PRINIVIL,ZESTRIL   Take 40 mg by mouth daily.      meclizine 25 MG tablet   Commonly known as: ANTIVERT   Take 25 mg by mouth 3 (three) times daily as needed.      QUEtiapine 100 MG tablet   Commonly known as: SEROQUEL   Take 50 mg by mouth at bedtime.      sertraline 100 MG tablet   Commonly known as:  ZOLOFT   Take 200 mg by mouth every morning.      traMADol 50 MG tablet   Commonly known as: ULTRAM   Take 50 mg by mouth 2 (two) times daily as needed.      traZODone 100 MG tablet   Commonly known as: DESYREL   Take 200 mg by mouth at bedtime.           Discharge Exam: Blood pressure 158/92, pulse 63, temperature 97.8 F (36.6 C), temperature source Oral, resp. rate 20, height 6\' 2"  (1.88 m), weight 104.5 kg (230 lb 6.1 oz), SpO2 98.00%. NAD CTA B S1, S2, RRR Soft, NT, BS+ No edema b/l Mild flattening of nasolabial fold on right, strength is 5/5 bilaterally  Disposition and Follow-up:  Discharge home, follow up with primary doctor at the [facility] Sleep study as an outpatient Follow up with psychiatry in 1 week Follow up with neurology in 1 month  Consults:  Neurology, Dr. Gerilyn Pilgrim   Significant Diagnostic Studies:  US Carotid Duplex Bilateral  06/15/2011  *RADIOLOGY REPORT*  Clinical Data: Left-sided weakness  BILATERAL  CAROTID DUPLEX ULTRASOUND  Technique: Wallace Cullens scale imaging, color Doppler and duplex ultrasound was performed of bilateral carotid and vertebral arteries in the neck.  Comparison:  None.  Criteria:  Quantification of carotid stenosis is based on velocity parameters that correlate the residual internal carotid diameter with NASCET-based stenosis levels, using the diameter of the distal internal carotid lumen as the denominator for stenosis measurement.  The following velocity measurements were obtained:                   PEAK SYSTOLIC/END DIASTOLIC RIGHT ICA:                        84cm/sec CCA:                        71cm/sec SYSTOLIC ICA/CCA RATIO:     1.19 DIASTOLIC ICA/CCA RATIO: ECA:                        129cm/sec  LEFT ICA:                        97cm/sec CCA:                        76cm/sec SYSTOLIC ICA/CCA RATIO:     1.29 DIASTOLIC ICA/CCA RATIO: ECA:                        128cm/sec  Findings:  RIGHT CAROTID ARTERY: Mild calcified plaque in the upper common  carotid.  Little if any plaque in the bulb. Normal-appearing low resistance internal carotid Doppler wave form.  RIGHT VERTEBRAL ARTERY:  Antegrade.  LEFT CAROTID ARTERY: Mild plaque in the upper left common carotid and lobe. Low resistance internal carotid Doppler wave form.  LEFT VERTEBRAL ARTERY:  Antegrade.  IMPRESSION: Less than 50% stenosis in the right and left internal carotid arteries.  Original Report Authenticated By: Donavan Burnet, M.D.    Brief H and P: For complete details please refer to admission H and P, but in brief [name] is an 51 y.o. male. Obese African American veteran with a history of liver cirrhosis, hypertension, migraine headaches, and chronic pain, chronic left leg paresthesias has been having severe headaches for the past 3 days, but reportedly his pain physicians and [facility] physicians have declined specific treatment pending further investigation. After midday today the patient noted that his speech was slurred and the right side of his face felt numb. Wife also reports such a pattern was slurred speech she noticed that his face was asymmetric. He arrived at the emergency room 5-1/2 hours after the onset of symptoms, facial weakness and slurred speech were noted. He has no old and new limb weakness, but he does report increased pain sensitivity in the right upper extremity.  Hospitalist service was called to assist with management about 8 hours after symptom onset.  There is no history of fever cough or cold chest pains or shortness black or bloody stool; he continues to have headache.  He has screw in his left ankle 1989, but says he has had MRIs done since having that screw placed.  He has passed an initial swallowing screen.   Hospital Course:  This gentleman was admitted to the hospital with complaints of severe headache and slurring of his speech.  It was felt that he  may have had a CVA, so the patient was admitted for further evaluation.  He had extensive work  up done including MRI of the brain which did not reveal any acute infarcts.  He was seen in consultation by neurology, who felt that this symptoms were likely related to a complicated migraine.  Patient was started on solumedrol, NSAIDS and fioricet.  He had significant improvement with this.  Although his headache is still present, he reports improvement with above therapy.  He has been cleared for discharge by neurology and will follow up with his primary doctor.  He was also noted to have significant depression.  He denies any suicidal ideations.  He has been chronically on zoloft and trazodone.  He will follow up with his psychiatrist for further med adjustments.  Time spent on Discharge:  Signed: Heidie Krall [facility] Pager: [phone] 06/16/2011, 11:11 AM

## 2011-06-16 NOTE — Progress Notes (Signed)
Subjective: Interval History: Objective: Vital signs in last 24 hours: Temp:  [97.9 F (36.6 C)-98.1 F (36.7 C)] 98 F (36.7 C) (05/22 0548) Pulse Rate:  [61-73] 64  (05/22 0548) Resp:  [14-20] 14  (05/22 0548) BP: (150-172)/(82-91) 150/83 mmHg (05/22 0548) SpO2:  [92 %-98 %] 97 % (05/22 0548)  Intake/Output from previous day: 05/21 0701 - 05/22 0700 In: 1080 [P.O.:1080] Out: -  Intake/Output this shift:   Nutritional status: Cardiac    Lab Results:  [facility] 06/15/11 0514 06/13/11 2015  WBC 3.9* 7.1  HGB 14.8 13.9  HCT 43.5 41.1  PLT 95* 96*  NA 138 140  K 3.3* 3.4*  CL 103 103  CO2 25 29  GLUCOSE 218* 92  BUN 13 14  CREATININE 0.92 1.02  CALCIUM 9.2 9.4  LABA1C -- --   Lipid Panel  Basename 06/14/11 0443  CHOL 184  TRIG 194*  HDL 27*  CHOLHDL 6.8  VLDL 39  LDLCALC 161*    Studies/Results: Dg Eye Foreign Body  06/14/2011  *RADIOLOGY REPORT*  Clinical Data: Metal in eyes, previously removed, MRI clearance  ORBITS FOR FOREIGN BODY - 2 VIEW  Comparison: None  Findings: No orbital metallic foreign body. Hypoplastic right frontal sinus. Visualized bones sinuses otherwise unremarkable.  IMPRESSION: No orbital metallic foreign body.  Original Report Authenticated By: Lollie Marrow, M.D.   Mr [facility] Wo Contrast  06/14/2011  *RADIOLOGY REPORT*  Clinical Data:  Slurred speech.  Weakness. Hypertension. Hypercholesterolemia.  MRI HEAD WITHOUT CONTRAST MRA HEAD WITHOUT CONTRAST  Technique: Multiplanar, multiecho pulse sequences of the brain and surrounding structures were obtained according to standard protocol without intravenous contrast.  Angiographic images of the head were obtained using MRA technique without contrast.  Comparison: 06/13/2011 CT.  No comparison MR.  MRI HEAD  Findings:  No acute infarct.  Artifact extends through the pons.  No intracranial hemorrhage.  Scattered punctate and patchy nonspecific white matter type changes probably related to result of  small vessel disease in this hypertensive patient.  No hydrocephalus.  No intracranial mass lesion detected on this unenhanced exam.  Minimal paranasal sinus mucosal thickening.  IMPRESSION: No acute infarct.  Mild small vessel disease type changes.  MRA HEAD  Findings: Ectatic distal vertical cervical segment and petrous segment of the internal carotid artery bilaterally.  Ectasia with areas of mild to moderate narrowing involving portions of the cavernous and supraclinoid segment of the internal carotid artery bilaterally.  Fetal type origin of the posterior cerebral artery bilaterally.  Hypoplastic A1 segment of the right anterior cerebral artery.  Moderate to marked stenosis of the middle cerebral artery branch vessels more notable on the right.  Poor delineation of the PICA bilaterally.  Mild to slightly moderate narrowing distal aspect of the right vertebral artery with mild narrowing distal aspect of the left vertebral artery.  Ectatic basilar artery with mild narrowing and irregularity.  Nonvisualization left AICA.  Irregularity and narrowing of the superior cerebellar artery and posterior cerebral artery bilaterally with moderate to marked tandem stenoses right posterior cerebral artery.  No aneurysm or vascular malformation detected on this slightly motion degraded exam.  IMPRESSION: Intracranial atherosclerotic type changes as detailed above.  Original Report Authenticated By: Fuller Canada, M.D.   Mr Brain Wo Contrast  06/14/2011  *RADIOLOGY REPORT*  Clinical Data:  Slurred speech.  Weakness. Hypertension. Hypercholesterolemia.  MRI HEAD WITHOUT CONTRAST MRA HEAD WITHOUT CONTRAST  Technique: Multiplanar, multiecho pulse sequences of the brain and surrounding structures were  obtained according to standard protocol without intravenous contrast.  Angiographic images of the head were obtained using MRA technique without contrast.  Comparison: 06/13/2011 CT.  No comparison MR.  MRI HEAD  Findings:  No  acute infarct.  Artifact extends through the pons.  No intracranial hemorrhage.  Scattered punctate and patchy nonspecific white matter type changes probably related to result of small vessel disease in this hypertensive patient.  No hydrocephalus.  No intracranial mass lesion detected on this unenhanced exam.  Minimal paranasal sinus mucosal thickening.  IMPRESSION: No acute infarct.  Mild small vessel disease type changes.  MRA HEAD  Findings: Ectatic distal vertical cervical segment and petrous segment of the internal carotid artery bilaterally.  Ectasia with areas of mild to moderate narrowing involving portions of the cavernous and supraclinoid segment of the internal carotid artery bilaterally.  Fetal type origin of the posterior cerebral artery bilaterally.  Hypoplastic A1 segment of the right anterior cerebral artery.  Moderate to marked stenosis of the middle cerebral artery branch vessels more notable on the right.  Poor delineation of the PICA bilaterally.  Mild to slightly moderate narrowing distal aspect of the right vertebral artery with mild narrowing distal aspect of the left vertebral artery.  Ectatic basilar artery with mild narrowing and irregularity.  Nonvisualization left AICA.  Irregularity and narrowing of the superior cerebellar artery and posterior cerebral artery bilaterally with moderate to marked tandem stenoses right posterior cerebral artery.  No aneurysm or vascular malformation detected on this slightly motion degraded exam.  IMPRESSION: Intracranial atherosclerotic type changes as detailed above.  Original Report Authenticated By: Fuller Canada, M.D.   US Carotid Duplex Bilateral  06/15/2011  *RADIOLOGY REPORT*  Clinical Data: Left-sided weakness  BILATERAL CAROTID DUPLEX ULTRASOUND  Technique: Wallace Cullens scale imaging, color Doppler and duplex ultrasound was performed of bilateral carotid and vertebral arteries in the neck.  Comparison:  None.  Criteria:  Quantification of carotid  stenosis is based on velocity parameters that correlate the residual internal carotid diameter with NASCET-based stenosis levels, using the diameter of the distal internal carotid lumen as the denominator for stenosis measurement.  The following velocity measurements were obtained:                   PEAK SYSTOLIC/END DIASTOLIC RIGHT ICA:                        84cm/sec CCA:                        71cm/sec SYSTOLIC ICA/CCA RATIO:     1.19 DIASTOLIC ICA/CCA RATIO: ECA:                        129cm/sec  LEFT ICA:                        97cm/sec CCA:                        76cm/sec SYSTOLIC ICA/CCA RATIO:     1.29 DIASTOLIC ICA/CCA RATIO: ECA:                        128cm/sec  Findings:  RIGHT CAROTID ARTERY: Mild calcified plaque in the upper common carotid.  Little if any plaque in the bulb. Normal-appearing low resistance internal carotid Doppler wave form.  RIGHT VERTEBRAL  ARTERY:  Antegrade.  LEFT CAROTID ARTERY: Mild plaque in the upper left common carotid and lobe. Low resistance internal carotid Doppler wave form.  LEFT VERTEBRAL ARTERY:  Antegrade.  IMPRESSION: Less than 50% stenosis in the right and left internal carotid arteries.  Original Report Authenticated By: Donavan Burnet, M.D.    Medications:  Scheduled Meds:    . aspirin  300 mg Rectal Daily   Or  . aspirin  325 mg Oral Daily  . enoxaparin  40 mg Subcutaneous Q24H  . gabapentin  600 mg Oral TID  . ketorolac  30 mg Intravenous Once  . lisinopril  10 mg Oral Daily  . loratadine  10 mg Oral Daily  . methylPREDNISolone (SOLU-MEDROL) injection  500 mg Intravenous Q24H  . nitroGLYCERIN      . omega-3 acid ethyl esters  3 g Oral BID  . polyvinyl alcohol  1 drop Both Eyes BID  . potassium chloride  40 mEq Oral Once  . QUEtiapine  150 mg Oral QHS  . sertraline  200 mg Oral q morning - 10a  . sodium chloride      . sodium chloride      . traZODone  200 mg Oral QHS  . DISCONTD: Carboxymethylcell-Hypromellose  1 application Ophthalmic  BID  . DISCONTD: fish oil-omega-3 fatty acids  3 g Oral BID   Continuous Infusions:  PRN Meds:.acetaminophen, acetaminophen, ALPRAZolam, butalbital-acetaminophen-caffeine, senna-docusate, traMADol   Assessment/Plan: See  dict   LOS: 3 days   Leola Fiore

## 2011-06-16 NOTE — Progress Notes (Signed)
Discharge instructions given to pt. And pt.'s family with understanding verbalized. Pt. Taken to car via w/c.

## 2011-06-16 NOTE — Discharge Instructions (Signed)

## 2011-06-20 ENCOUNTER — Emergency Department (HOSPITAL_COMMUNITY): Payer: Non-veteran care

## 2011-06-20 ENCOUNTER — Inpatient Hospital Stay (HOSPITAL_COMMUNITY)
Admission: EM | Admit: 2011-06-20 | Discharge: 2011-06-20 | DRG: 312 | Disposition: A | Discharge status: Other Acute Inpatient Hospital | Payer: Non-veteran care | Attending: Internal Medicine | Admitting: Internal Medicine

## 2011-06-20 ENCOUNTER — Encounter (HOSPITAL_COMMUNITY): Payer: Self-pay | Admitting: Emergency Medicine

## 2011-06-20 DIAGNOSIS — I959 Hypotension, unspecified: Secondary | ICD-10-CM | POA: Diagnosis present

## 2011-06-20 DIAGNOSIS — E860 Dehydration: Secondary | ICD-10-CM

## 2011-06-20 DIAGNOSIS — B1920 Unspecified viral hepatitis C without hepatic coma: Secondary | ICD-10-CM

## 2011-06-20 DIAGNOSIS — K7460 Unspecified cirrhosis of liver: Secondary | ICD-10-CM

## 2011-06-20 DIAGNOSIS — R55 Syncope and collapse: Principal | ICD-10-CM

## 2011-06-20 DIAGNOSIS — N179 Acute kidney failure, unspecified: Secondary | ICD-10-CM

## 2011-06-20 DIAGNOSIS — R4789 Other speech disturbances: Secondary | ICD-10-CM | POA: Diagnosis present

## 2011-06-20 DIAGNOSIS — R519 Headache, unspecified: Secondary | ICD-10-CM | POA: Diagnosis present

## 2011-06-20 DIAGNOSIS — I10 Essential (primary) hypertension: Secondary | ICD-10-CM

## 2011-06-20 DIAGNOSIS — Z87891 Personal history of nicotine dependence: Secondary | ICD-10-CM

## 2011-06-20 DIAGNOSIS — R42 Dizziness and giddiness: Secondary | ICD-10-CM | POA: Diagnosis present

## 2011-06-20 DIAGNOSIS — F329 Major depressive disorder, single episode, unspecified: Secondary | ICD-10-CM

## 2011-06-20 DIAGNOSIS — M549 Dorsalgia, unspecified: Secondary | ICD-10-CM

## 2011-06-20 DIAGNOSIS — E7800 Pure hypercholesterolemia, unspecified: Secondary | ICD-10-CM

## 2011-06-20 DIAGNOSIS — R202 Paresthesia of skin: Secondary | ICD-10-CM

## 2011-06-20 DIAGNOSIS — E876 Hypokalemia: Secondary | ICD-10-CM

## 2011-06-20 DIAGNOSIS — R51 Headache: Secondary | ICD-10-CM

## 2011-06-20 DIAGNOSIS — F32A Depression, unspecified: Secondary | ICD-10-CM

## 2011-06-20 DIAGNOSIS — G8929 Other chronic pain: Secondary | ICD-10-CM

## 2011-06-20 DIAGNOSIS — G43109 Migraine with aura, not intractable, without status migrainosus: Secondary | ICD-10-CM

## 2011-06-20 LAB — GLUCOSE, CAPILLARY: Glucose-Capillary: 149 mg/dL — ABNORMAL HIGH (ref 70–99)

## 2011-06-20 LAB — CBC
HCT: 42 % (ref 39.0–52.0)
MCHC: 34.3 g/dL (ref 30.0–36.0)
Platelets: 113 10*3/uL — ABNORMAL LOW (ref 150–400)
RDW: 14.5 % (ref 11.5–15.5)

## 2011-06-20 LAB — COMPREHENSIVE METABOLIC PANEL
Albumin: 2.9 g/dL — ABNORMAL LOW (ref 3.5–5.2)
Alkaline Phosphatase: 91 U/L (ref 39–117)
BUN: 22 mg/dL (ref 6–23)
Potassium: 2.8 mEq/L — ABNORMAL LOW (ref 3.5–5.1)
Total Protein: 6 g/dL (ref 6.0–8.3)

## 2011-06-20 LAB — RAPID URINE DRUG SCREEN, HOSP PERFORMED
Cocaine: NOT DETECTED
Opiates: NOT DETECTED

## 2011-06-20 LAB — CK TOTAL AND CKMB (NOT AT ARMC): Relative Index: 3.1 — ABNORMAL HIGH (ref 0.0–2.5)

## 2011-06-20 LAB — PROCALCITONIN: Procalcitonin: 0.29 ng/mL

## 2011-06-20 LAB — LACTIC ACID, PLASMA: Lactic Acid, Venous: 1.5 mmol/L (ref 0.5–2.2)

## 2011-06-20 LAB — MRSA PCR SCREENING: MRSA by PCR: NEGATIVE

## 2011-06-20 MED ORDER — ONDANSETRON HCL 4 MG PO TABS: 4.0000 mg | ORAL_TABLET | Freq: Four times a day (QID) | ORAL | Status: DC | PRN

## 2011-06-20 MED ORDER — SODIUM CHLORIDE 0.9 % IJ SOLN: 3.0000 mL | Freq: Two times a day (BID) | INTRAMUSCULAR | Status: DC

## 2011-06-20 MED ORDER — POTASSIUM CHLORIDE IN NACL 20-0.9 MEQ/L-% IV SOLN
INTRAVENOUS | Status: DC
  Administered 2011-06-20: 10:00:00 via INTRAVENOUS

## 2011-06-20 MED ORDER — GABAPENTIN 300 MG PO CAPS
600.0000 mg | ORAL_CAPSULE | Freq: Three times a day (TID) | ORAL | Status: DC
  Administered 2011-06-20: 600 mg via ORAL
  Filled 2011-06-20: qty 2

## 2011-06-20 MED ORDER — TRAZODONE HCL 50 MG PO TABS: 200.0000 mg | ORAL_TABLET | Freq: Every day | ORAL | Status: DC

## 2011-06-20 MED ORDER — BUTALBITAL-APAP-CAFFEINE 50-325-40 MG PO TABS: 2.0000 | ORAL_TABLET | Freq: Four times a day (QID) | ORAL | Status: DC | PRN

## 2011-06-20 MED ORDER — SERTRALINE HCL 50 MG PO TABS: 100.0000 mg | ORAL_TABLET | Freq: Every morning | ORAL | Status: DC

## 2011-06-20 MED ORDER — SODIUM CHLORIDE 0.9 % IV BOLUS (SEPSIS)
1000.0000 mL | Freq: Once | INTRAVENOUS | Status: AC
  Administered 2011-06-20: 1000 mL via INTRAVENOUS

## 2011-06-20 MED ORDER — TRAMADOL HCL 50 MG PO TABS: 50.0000 mg | ORAL_TABLET | Freq: Two times a day (BID) | ORAL | Status: DC | PRN

## 2011-06-20 MED ORDER — QUETIAPINE FUMARATE 25 MG PO TABS: 50.0000 mg | ORAL_TABLET | Freq: Every day | ORAL | Status: DC

## 2011-06-20 MED ORDER — TRAZODONE HCL 50 MG PO TABS: 100.0000 mg | ORAL_TABLET | Freq: Every day | ORAL | Status: DC

## 2011-06-20 MED ORDER — HEPARIN SODIUM (PORCINE) 5000 UNIT/ML IJ SOLN
5000.0000 [IU] | Freq: Three times a day (TID) | INTRAMUSCULAR | Status: DC
  Administered 2011-06-20: 5000 [IU] via SUBCUTANEOUS
  Filled 2011-06-20 (×2): qty 1

## 2011-06-20 MED ORDER — SERTRALINE HCL 50 MG PO TABS
200.0000 mg | ORAL_TABLET | Freq: Every morning | ORAL | Status: DC
  Filled 2011-06-20: qty 4

## 2011-06-20 MED ORDER — POTASSIUM CHLORIDE 10 MEQ/100ML IV SOLN
10.0000 meq | Freq: Once | INTRAVENOUS | Status: AC
  Administered 2011-06-20: 10 meq via INTRAVENOUS
  Filled 2011-06-20: qty 100

## 2011-06-20 MED ORDER — ASPIRIN 81 MG PO CHEW
81.0000 mg | CHEWABLE_TABLET | Freq: Every day | ORAL | Status: DC
  Administered 2011-06-20: 81 mg via ORAL
  Filled 2011-06-20: qty 1

## 2011-06-20 MED ORDER — HYDROMORPHONE HCL PF 1 MG/ML IJ SOLN
1.0000 mg | Freq: Once | INTRAMUSCULAR | Status: AC
  Administered 2011-06-20: 1 mg via INTRAVENOUS
  Filled 2011-06-20: qty 1

## 2011-06-20 MED ORDER — ARTIFICIAL TEARS OP OINT
TOPICAL_OINTMENT | Freq: Two times a day (BID) | OPHTHALMIC | Status: DC
  Filled 2011-06-20: qty 3.5

## 2011-06-20 MED ORDER — POTASSIUM CHLORIDE CRYS ER 20 MEQ PO TBCR
60.0000 meq | EXTENDED_RELEASE_TABLET | Freq: Once | ORAL | Status: DC
  Filled 2011-06-20: qty 3

## 2011-06-20 MED ORDER — CARBOXYMETHYLCELL-HYPROMELLOSE 0.25-0.3 % OP GEL
1.0000 | Freq: Two times a day (BID) | OPHTHALMIC | Status: DC
Start: 2011-06-20 — End: 2011-06-20

## 2011-06-20 MED ORDER — SODIUM CHLORIDE 0.9 % IV SOLN
Freq: Once | INTRAVENOUS | Status: AC
  Administered 2011-06-20: 05:00:00 via INTRAVENOUS

## 2011-06-20 MED ORDER — ONDANSETRON HCL 4 MG/2ML IJ SOLN
4.0000 mg | Freq: Once | INTRAMUSCULAR | Status: AC
  Administered 2011-06-20: 4 mg via INTRAVENOUS
  Filled 2011-06-20: qty 2

## 2011-06-20 MED ORDER — SODIUM CHLORIDE 0.9 % IV SOLN: INTRAVENOUS | Status: DC

## 2011-06-20 MED ORDER — ONDANSETRON HCL 4 MG/2ML IJ SOLN: 4.0000 mg | Freq: Four times a day (QID) | INTRAMUSCULAR | Status: DC | PRN

## 2011-06-20 MED ORDER — SODIUM CHLORIDE 0.9 % IV SOLN
Freq: Once | INTRAVENOUS | Status: AC
  Administered 2011-06-20: 06:00:00 via INTRAVENOUS

## 2011-06-20 MED ORDER — CARBOXYMETHYLCELL-HYPROMELLOSE 0.25-0.3 % OP GEL: 1.0000 "application " | Freq: Two times a day (BID) | OPHTHALMIC | Status: DC

## 2011-06-20 MED ORDER — MECLIZINE HCL 12.5 MG PO TABS: 25.0000 mg | ORAL_TABLET | Freq: Three times a day (TID) | ORAL | Status: DC | PRN

## 2011-06-20 NOTE — H&P (Signed)
[name] MRN: [id number] DOB/AGE: [birth year] 51 y.o. Primary Care Physician: [facility]. Admit date: 06/20/2011 Chief Complaint: Syncopal episode, lightheadedness. HPI: This 51 year old veteran presents with the above symptoms which started today at approximately 1 to 2 AM. His speech is slurred so it is difficult for me to understand what he is saying exactly but it appears that he was sitting at the table eating some food and suddenly became lightheaded and dizzy and passed out. He admits to having poor fluid intake. There has been no nausea or vomiting. He still has somewhat of a headache from his complex migraine that was diagnosed on his recent admission here. At this time he presented with a possible CVA but MRI was negative.  Past Medical History  Diagnosis Date  . Hypertension   . Hepatitis C   . Cirrhosis of liver   . Depression   . Gallstones   . Chronic back pain   . Left leg paresthesias   . Vertigo   . Hypercholesteremia   . Chronic neck pain   . Headache   . Complicated migraine 06/16/2011   Past Surgical History  Procedure Date  . Orthopedic surgery     "on my legs"        Family History  Problem Relation Age of Onset  . Diabetes Brother   . Diabetes Sister   . Diabetes Father   . Diabetes Mother   . Hypertension Mother   . Hypertension Father   . Hypertension Sister   . Hypertension Brother     Social History:  reports that he quit smoking about 2 years ago. He does not have any smokeless tobacco history on file. He reports that he does not drink alcohol or use illicit drugs.   Allergies:  Allergies  Allergen Reactions  . Bee Venom Anaphylaxis and Swelling     (Not in a hospital admission)     WUJ:WJXBJ from the symptoms mentioned above,there are no other symptoms referable to all systems reviewed.  Physical Exam: Blood pressure 95/58, pulse 53, temperature 97.6 F (36.4 C), temperature source Oral, resp. rate 16, weight  95.255 kg (210 lb), SpO2 100.00%. He looks systemically well. He does look clinically dehydrated. His speech is somewhat slurred. He is alert and orientated. Heart sounds are present and normal and in sinus rhythm. He has a soft blood pressure. Lung fields are clear. Abdomen is soft and nontender. There are no obvious focal neurological signs.    Basename 06/20/11 0525  WBC 8.7  NEUTROABS --  HGB 14.4  HCT 42.0  MCV 85.9  PLT 113*    Basename 06/20/11 0610  NA 137  K 2.8*  CL 102  CO2 27  GLUCOSE 78  BUN 22  CREATININE 1.93*  CALCIUM 8.4  MG --      Recent Results (from the past 240 hour(s))  URINE CULTURE     Status: Normal   Collection Time   06/13/11  8:47 PM      Component Value Range Status Comment   Specimen Description URINE, CLEAN CATCH   Final    Special Requests NONE   Final    Culture  Setup Time [id number]   Final    Colony Count NO GROWTH   Final    Culture NO GROWTH   Final    Report Status 06/15/2011 FINAL   Final      Dg Eye Foreign Body  06/14/2011  *RADIOLOGY REPORT*  Clinical Data: Metal in eyes,  previously removed, MRI clearance  ORBITS FOR FOREIGN BODY - 2 VIEW  Comparison: None  Findings: No orbital metallic foreign body. Hypoplastic right frontal sinus. Visualized bones sinuses otherwise unremarkable.  IMPRESSION: No orbital metallic foreign body.  Original Report Authenticated By: Lollie Marrow, M.D.   Dg Chest 2 View  06/13/2011  *RADIOLOGY REPORT*  Clinical Data: Altered mental status, weakness.  CHEST - 2 VIEW  Comparison: 04/14/2008  Findings: Heart size upper normal to mildly enlarged.  Mild mediastinal prominence may be exaggerated by hypoaeration. Interstitial prominence and minimal lung base atelectasis, otherwise without focal consolidation, pleural effusion, or pneumothorax.  No acute osseous finding.  IMPRESSION: Heart size upper normal to mildly enlarged, with interstitial prominence. Minimal linear lung base opacities, likely  atelectasis. Otherwise, no focal consolidation.  Original Report Authenticated By: Waneta Martins, M.D.   Ct Head Wo Contrast  06/20/2011  *RADIOLOGY REPORT*  Clinical Data:  Acute loss of consciousness.  CT HEAD WITHOUT CONTRAST  Technique: Contiguous axial images were obtained from the base of the skull through the vertex without intravenous contrast.  Comparison:   None.  Findings:  The ventricles and sulci are symmetrical without significant effacement, displacement, or dilatation. No mass effect or midline shift. No abnormal extra-axial fluid collections. The grey-white matter junction is distinct. Basal cisterns are not effaced. No acute intracranial hemorrhage. No depressed skull fractures. Vascular calcifications. mild cerebral atrophy.  IMPRESSION: No acute intracranial abnormality.  Original Report Authenticated By: Marlon Pel, M.D.   Ct Head Wo Contrast  06/13/2011  *RADIOLOGY REPORT*  Clinical Data: Slurred speech, weakness.  CT HEAD WITHOUT CONTRAST  Technique:  Contiguous axial images were obtained from the base of the skull through the vertex without contrast.  Comparison: 04/14/2008  Findings: Focal hypoattenuation along the right external capsule is unchanged, perhaps a prior lacunar infarction. There is no evidence for acute hemorrhage, hydrocephalus, mass lesion, or abnormal extra- axial fluid collection.  No definite CT evidence for acute infarction.  The visualized paranasal sinuses and mastoid air cells are predominately clear.  IMPRESSION: No acute intracranial abnormality.  Original Report Authenticated By: Waneta Martins, M.D.   Mr [facility] Wo Contrast  06/14/2011  *RADIOLOGY REPORT*  Clinical Data:  Slurred speech.  Weakness. Hypertension. Hypercholesterolemia.  MRI HEAD WITHOUT CONTRAST MRA HEAD WITHOUT CONTRAST  Technique: Multiplanar, multiecho pulse sequences of the brain and surrounding structures were obtained according to standard protocol without intravenous  contrast.  Angiographic images of the head were obtained using MRA technique without contrast.  Comparison: 06/13/2011 CT.  No comparison MR.  MRI HEAD  Findings:  No acute infarct.  Artifact extends through the pons.  No intracranial hemorrhage.  Scattered punctate and patchy nonspecific white matter type changes probably related to result of small vessel disease in this hypertensive patient.  No hydrocephalus.  No intracranial mass lesion detected on this unenhanced exam.  Minimal paranasal sinus mucosal thickening.  IMPRESSION: No acute infarct.  Mild small vessel disease type changes.  MRA HEAD  Findings: Ectatic distal vertical cervical segment and petrous segment of the internal carotid artery bilaterally.  Ectasia with areas of mild to moderate narrowing involving portions of the cavernous and supraclinoid segment of the internal carotid artery bilaterally.  Fetal type origin of the posterior cerebral artery bilaterally.  Hypoplastic A1 segment of the right anterior cerebral artery.  Moderate to marked stenosis of the middle cerebral artery branch vessels more notable on the right.  Poor delineation of the PICA bilaterally.  Mild to slightly moderate narrowing distal aspect of the right vertebral artery with mild narrowing distal aspect of the left vertebral artery.  Ectatic basilar artery with mild narrowing and irregularity.  Nonvisualization left AICA.  Irregularity and narrowing of the superior cerebellar artery and posterior cerebral artery bilaterally with moderate to marked tandem stenoses right posterior cerebral artery.  No aneurysm or vascular malformation detected on this slightly motion degraded exam.  IMPRESSION: Intracranial atherosclerotic type changes as detailed above.  Original Report Authenticated By: Fuller Canada, M.D.   Mr Brain Wo Contrast  06/14/2011  *RADIOLOGY REPORT*  Clinical Data:  Slurred speech.  Weakness. Hypertension. Hypercholesterolemia.  MRI HEAD WITHOUT CONTRAST MRA  HEAD WITHOUT CONTRAST  Technique: Multiplanar, multiecho pulse sequences of the brain and surrounding structures were obtained according to standard protocol without intravenous contrast.  Angiographic images of the head were obtained using MRA technique without contrast.  Comparison: 06/13/2011 CT.  No comparison MR.  MRI HEAD  Findings:  No acute infarct.  Artifact extends through the pons.  No intracranial hemorrhage.  Scattered punctate and patchy nonspecific white matter type changes probably related to result of small vessel disease in this hypertensive patient.  No hydrocephalus.  No intracranial mass lesion detected on this unenhanced exam.  Minimal paranasal sinus mucosal thickening.  IMPRESSION: No acute infarct.  Mild small vessel disease type changes.  MRA HEAD  Findings: Ectatic distal vertical cervical segment and petrous segment of the internal carotid artery bilaterally.  Ectasia with areas of mild to moderate narrowing involving portions of the cavernous and supraclinoid segment of the internal carotid artery bilaterally.  Fetal type origin of the posterior cerebral artery bilaterally.  Hypoplastic A1 segment of the right anterior cerebral artery.  Moderate to marked stenosis of the middle cerebral artery branch vessels more notable on the right.  Poor delineation of the PICA bilaterally.  Mild to slightly moderate narrowing distal aspect of the right vertebral artery with mild narrowing distal aspect of the left vertebral artery.  Ectatic basilar artery with mild narrowing and irregularity.  Nonvisualization left AICA.  Irregularity and narrowing of the superior cerebellar artery and posterior cerebral artery bilaterally with moderate to marked tandem stenoses right posterior cerebral artery.  No aneurysm or vascular malformation detected on this slightly motion degraded exam.  IMPRESSION: Intracranial atherosclerotic type changes as detailed above.  Original Report Authenticated By: Fuller Canada, M.D.   US Carotid Duplex Bilateral  06/15/2011  *RADIOLOGY REPORT*  Clinical Data: Left-sided weakness  BILATERAL CAROTID DUPLEX ULTRASOUND  Technique: Wallace Cullens scale imaging, color Doppler and duplex ultrasound was performed of bilateral carotid and vertebral arteries in the neck.  Comparison:  None.  Criteria:  Quantification of carotid stenosis is based on velocity parameters that correlate the residual internal carotid diameter with NASCET-based stenosis levels, using the diameter of the distal internal carotid lumen as the denominator for stenosis measurement.  The following velocity measurements were obtained:                   PEAK SYSTOLIC/END DIASTOLIC RIGHT ICA:                        84cm/sec CCA:                        71cm/sec SYSTOLIC ICA/CCA RATIO:     1.19 DIASTOLIC ICA/CCA RATIO: ECA:  129cm/sec  LEFT ICA:                        97cm/sec CCA:                        76cm/sec SYSTOLIC ICA/CCA RATIO:     1.29 DIASTOLIC ICA/CCA RATIO: ECA:                        128cm/sec  Findings:  RIGHT CAROTID ARTERY: Mild calcified plaque in the upper common carotid.  Little if any plaque in the bulb. Normal-appearing low resistance internal carotid Doppler wave form.  RIGHT VERTEBRAL ARTERY:  Antegrade.  LEFT CAROTID ARTERY: Mild plaque in the upper left common carotid and lobe. Low resistance internal carotid Doppler wave form.  LEFT VERTEBRAL ARTERY:  Antegrade.  IMPRESSION: Less than 50% stenosis in the right and left internal carotid arteries.  Original Report Authenticated By: Donavan Burnet, M.D.   Dg Chest Port 1 View  06/20/2011  *RADIOLOGY REPORT*  Clinical Data: Altered mental status  PORTABLE CHEST - 1 VIEW  Comparison: 06/13/2011  Findings: Shallow inspiration.  Borderline heart size with normal pulmonary vascularity.  No focal airspace consolidation in the lungs.  No blunting of costophrenic angles.  No pneumothorax.  No significant changes since the previous study.   IMPRESSION: No evidence of active pulmonary disease.  Shallow inspiration.  Original Report Authenticated By: Marlon Pel, M.D.   Impression: 1. Syncopal episode secondary to hypovolemia/dehydration. 2. Acute renal failure, secondary to dehydration. 3. Hypertension, currently hypotensive. 4. Hepatitis C cirrhosis. 5. Recent diagnosis of complex migraine.     Plan: 1. Admit to telemetry. 2. Intravenous fluids. 3. Serial cardiac enzymes, although I doubt this is a cardiac event. 4. Urine drug screen would be useful. Further recommendations will depend on patient's hospital progress.      Wilson Singer Pager [phone]  06/20/2011, 7:54 AM

## 2011-06-20 NOTE — Discharge Summary (Signed)
Physician Discharge Summary  Patient ID: [name] MRN: [id number] DOB/AGE: 04-21-1960 51 y.o. Primary Care Physician: [facility], [place], [place]. Admit date: 06/20/2011 Discharge date: 06/20/2011    Discharge Diagnoses:  1. Presyncopal episode. 2. Acute renal failure/dehydration causing #1. 3. Hypertension. 4. Complex migraine. 5. Hepatitis C cirrhosis.   Medication List  As of 06/20/2011  1:24 PM   STOP taking these medications         butalbital-acetaminophen-caffeine 50-325-40 MG per tablet      hydrochlorothiazide 25 MG tablet         TAKE these medications         amLODipine 10 MG tablet   Commonly known as: NORVASC   Take 5 mg by mouth daily.      aspirin 81 MG chewable tablet   Chew 81 mg by mouth daily.      cetirizine 10 MG tablet   Commonly known as: ZYRTEC   Take 10 mg by mouth daily.      cholecalciferol 1000 UNITS tablet   Commonly known as: VITAMIN D   Take 1,000 Units by mouth daily.      fish oil-omega-3 fatty acids 1000 MG capsule   Take 3 g by mouth 2 (two) times daily.      gabapentin 300 MG capsule   Commonly known as: NEURONTIN   Take 600 mg by mouth 3 (three) times daily.      GENTEAL 0.25-0.3 % Gel   Generic drug: Carboxymethylcell-Hypromellose   Apply 1 application to eye 2 (two) times daily. 0.3% one application to both eyes twice a day for ocular dryness      lisinopril 40 MG tablet   Commonly known as: PRINIVIL,ZESTRIL   Take 40 mg by mouth daily.      meclizine 25 MG tablet   Commonly known as: ANTIVERT   Take 25 mg by mouth 3 (three) times daily as needed.      QUEtiapine 100 MG tablet   Commonly known as: SEROQUEL   Take 50 mg by mouth at bedtime.      sertraline 100 MG tablet   Commonly known as: ZOLOFT   Take 200 mg by mouth every morning.      traMADol 50 MG tablet   Commonly known as: ULTRAM   Take 50 mg by mouth 2 (two) times daily as needed.      traZODone 100 MG tablet   Commonly known as:  DESYREL   Take 200 mg by mouth at bedtime.            Discharged Condition: Stable.    Consults: None.  Significant Diagnostic Studies: Dg Eye Foreign Body  06/14/2011  *RADIOLOGY REPORT*  Clinical Data: Metal in eyes, previously removed, MRI clearance  ORBITS FOR FOREIGN BODY - 2 VIEW  Comparison: None  Findings: No orbital metallic foreign body. Hypoplastic right frontal sinus. Visualized bones sinuses otherwise unremarkable.  IMPRESSION: No orbital metallic foreign body.  Original Report Authenticated By: Lollie Marrow, M.D.   Dg Chest 2 View  06/13/2011  *RADIOLOGY REPORT*  Clinical Data: Altered mental status, weakness.  CHEST - 2 VIEW  Comparison: 04/14/2008  Findings: Heart size upper normal to mildly enlarged.  Mild mediastinal prominence may be exaggerated by hypoaeration. Interstitial prominence and minimal lung base atelectasis, otherwise without focal consolidation, pleural effusion, or pneumothorax.  No acute osseous finding.  IMPRESSION: Heart size upper normal to mildly enlarged, with interstitial prominence. Minimal linear lung base opacities, likely atelectasis.  Otherwise, no focal consolidation.  Original Report Authenticated By: Waneta Martins, M.D.   Ct Head Wo Contrast  06/20/2011  *RADIOLOGY REPORT*  Clinical Data:  Acute loss of consciousness.  CT HEAD WITHOUT CONTRAST  Technique: Contiguous axial images were obtained from the base of the skull through the vertex without intravenous contrast.  Comparison:   None.  Findings:  The ventricles and sulci are symmetrical without significant effacement, displacement, or dilatation. No mass effect or midline shift. No abnormal extra-axial fluid collections. The grey-white matter junction is distinct. Basal cisterns are not effaced. No acute intracranial hemorrhage. No depressed skull fractures. Vascular calcifications. mild cerebral atrophy.  IMPRESSION: No acute intracranial abnormality.  Original Report Authenticated By:  Marlon Pel, M.D.   Ct Head Wo Contrast  06/13/2011  *RADIOLOGY REPORT*  Clinical Data: Slurred speech, weakness.  CT HEAD WITHOUT CONTRAST  Technique:  Contiguous axial images were obtained from the base of the skull through the vertex without contrast.  Comparison: 04/14/2008  Findings: Focal hypoattenuation along the right external capsule is unchanged, perhaps a prior lacunar infarction. There is no evidence for acute hemorrhage, hydrocephalus, mass lesion, or abnormal extra- axial fluid collection.  No definite CT evidence for acute infarction.  The visualized paranasal sinuses and mastoid air cells are predominately clear.  IMPRESSION: No acute intracranial abnormality.  Original Report Authenticated By: Waneta Martins, M.D.   Mr [facility] Wo Contrast  06/14/2011  *RADIOLOGY REPORT*  Clinical Data:  Slurred speech.  Weakness. Hypertension. Hypercholesterolemia.  MRI HEAD WITHOUT CONTRAST MRA HEAD WITHOUT CONTRAST  Technique: Multiplanar, multiecho pulse sequences of the brain and surrounding structures were obtained according to standard protocol without intravenous contrast.  Angiographic images of the head were obtained using MRA technique without contrast.  Comparison: 06/13/2011 CT.  No comparison MR.  MRI HEAD  Findings:  No acute infarct.  Artifact extends through the pons.  No intracranial hemorrhage.  Scattered punctate and patchy nonspecific white matter type changes probably related to result of small vessel disease in this hypertensive patient.  No hydrocephalus.  No intracranial mass lesion detected on this unenhanced exam.  Minimal paranasal sinus mucosal thickening.  IMPRESSION: No acute infarct.  Mild small vessel disease type changes.  MRA HEAD  Findings: Ectatic distal vertical cervical segment and petrous segment of the internal carotid artery bilaterally.  Ectasia with areas of mild to moderate narrowing involving portions of the cavernous and supraclinoid segment of the  internal carotid artery bilaterally.  Fetal type origin of the posterior cerebral artery bilaterally.  Hypoplastic A1 segment of the right anterior cerebral artery.  Moderate to marked stenosis of the middle cerebral artery branch vessels more notable on the right.  Poor delineation of the PICA bilaterally.  Mild to slightly moderate narrowing distal aspect of the right vertebral artery with mild narrowing distal aspect of the left vertebral artery.  Ectatic basilar artery with mild narrowing and irregularity.  Nonvisualization left AICA.  Irregularity and narrowing of the superior cerebellar artery and posterior cerebral artery bilaterally with moderate to marked tandem stenoses right posterior cerebral artery.  No aneurysm or vascular malformation detected on this slightly motion degraded exam.  IMPRESSION: Intracranial atherosclerotic type changes as detailed above.  Original Report Authenticated By: Fuller Canada, M.D.   Mr Brain Wo Contrast  06/14/2011  *RADIOLOGY REPORT*  Clinical Data:  Slurred speech.  Weakness. Hypertension. Hypercholesterolemia.  MRI HEAD WITHOUT CONTRAST MRA HEAD WITHOUT CONTRAST  Technique: Multiplanar, multiecho pulse sequences of the brain  and surrounding structures were obtained according to standard protocol without intravenous contrast.  Angiographic images of the head were obtained using MRA technique without contrast.  Comparison: 06/13/2011 CT.  No comparison MR.  MRI HEAD  Findings:  No acute infarct.  Artifact extends through the pons.  No intracranial hemorrhage.  Scattered punctate and patchy nonspecific white matter type changes probably related to result of small vessel disease in this hypertensive patient.  No hydrocephalus.  No intracranial mass lesion detected on this unenhanced exam.  Minimal paranasal sinus mucosal thickening.  IMPRESSION: No acute infarct.  Mild small vessel disease type changes.  MRA HEAD  Findings: Ectatic distal vertical cervical segment and  petrous segment of the internal carotid artery bilaterally.  Ectasia with areas of mild to moderate narrowing involving portions of the cavernous and supraclinoid segment of the internal carotid artery bilaterally.  Fetal type origin of the posterior cerebral artery bilaterally.  Hypoplastic A1 segment of the right anterior cerebral artery.  Moderate to marked stenosis of the middle cerebral artery branch vessels more notable on the right.  Poor delineation of the PICA bilaterally.  Mild to slightly moderate narrowing distal aspect of the right vertebral artery with mild narrowing distal aspect of the left vertebral artery.  Ectatic basilar artery with mild narrowing and irregularity.  Nonvisualization left AICA.  Irregularity and narrowing of the superior cerebellar artery and posterior cerebral artery bilaterally with moderate to marked tandem stenoses right posterior cerebral artery.  No aneurysm or vascular malformation detected on this slightly motion degraded exam.  IMPRESSION: Intracranial atherosclerotic type changes as detailed above.  Original Report Authenticated By: Fuller Canada, M.D.   US Carotid Duplex Bilateral  06/15/2011  *RADIOLOGY REPORT*  Clinical Data: Left-sided weakness  BILATERAL CAROTID DUPLEX ULTRASOUND  Technique: Wallace Cullens scale imaging, color Doppler and duplex ultrasound was performed of bilateral carotid and vertebral arteries in the neck.  Comparison:  None.  Criteria:  Quantification of carotid stenosis is based on velocity parameters that correlate the residual internal carotid diameter with NASCET-based stenosis levels, using the diameter of the distal internal carotid lumen as the denominator for stenosis measurement.  The following velocity measurements were obtained:                   PEAK SYSTOLIC/END DIASTOLIC RIGHT ICA:                        84cm/sec CCA:                        71cm/sec SYSTOLIC ICA/CCA RATIO:     1.19 DIASTOLIC ICA/CCA RATIO: ECA:                         129cm/sec  LEFT ICA:                        97cm/sec CCA:                        76cm/sec SYSTOLIC ICA/CCA RATIO:     1.29 DIASTOLIC ICA/CCA RATIO: ECA:                        128cm/sec  Findings:  RIGHT CAROTID ARTERY: Mild calcified plaque in the upper common carotid.  Little if any plaque in the bulb. Normal-appearing low resistance internal carotid Doppler wave  form.  RIGHT VERTEBRAL ARTERY:  Antegrade.  LEFT CAROTID ARTERY: Mild plaque in the upper left common carotid and lobe. Low resistance internal carotid Doppler wave form.  LEFT VERTEBRAL ARTERY:  Antegrade.  IMPRESSION: Less than 50% stenosis in the right and left internal carotid arteries.  Original Report Authenticated By: Donavan Burnet, M.D.   Dg Chest Port 1 View  06/20/2011  *RADIOLOGY REPORT*  Clinical Data: Altered mental status  PORTABLE CHEST - 1 VIEW  Comparison: 06/13/2011  Findings: Shallow inspiration.  Borderline heart size with normal pulmonary vascularity.  No focal airspace consolidation in the lungs.  No blunting of costophrenic angles.  No pneumothorax.  No significant changes since the previous study.  IMPRESSION: No evidence of active pulmonary disease.  Shallow inspiration.  Original Report Authenticated By: Marlon Pel, M.D.    Lab Results: Basic Metabolic Panel:  [facility] 06/20/11 0610  NA 137  K 2.8*  CL 102  CO2 27  GLUCOSE 78  BUN 22  CREATININE 1.93*  CALCIUM 8.4  MG --  PHOS --   Liver Function Tests:  [facility] 06/20/11 0610  AST 64*  ALT 63*  ALKPHOS 91  BILITOT 0.6  PROT 6.0  ALBUMIN 2.9*     CBC:  [facility] 06/20/11 0525  WBC 8.7  NEUTROABS --  HGB 14.4  HCT 42.0  MCV 85.9  PLT 113*    Recent Results (from the past 240 hour(s))  URINE CULTURE     Status: Normal   Collection Time   06/13/11  8:47 PM      Component Value Range Status Comment   Specimen Description URINE, CLEAN CATCH   Final    Special Requests NONE   Final    Culture  Setup Time [id number]   Final     Colony Count NO GROWTH   Final    Culture NO GROWTH   Final    Report Status 06/15/2011 FINAL   Final   MRSA PCR SCREENING     Status: Normal   Collection Time   06/20/11  8:54 AM      Component Value Range Status Comment   MRSA by PCR NEGATIVE  NEGATIVE  Final      Hospital Course: This 51 year old man was admitted with symptoms of presyncopal episode, preceded by lightheadedness. He cannot give me a clear history and he was somewhat drowsy in the emergency room. He did not have any focal neurological signs. He was found to be clinically and biochemically dehydrated and in acute renal failure. He has been given intravenous fluids, his hypokalemia has been repleted with potassium. He feels somewhat better. His urine drug screen is positive for barbiturates-he has been taking Fioricet. He request to be transferred to the [facility] [place] now.  Discharge Exam: Blood pressure 110/76, pulse 49, temperature 97.6 F (36.4 C), temperature source Oral, resp. rate 9, height 6\' 1"  (1.854 m), weight 105 kg (231 lb 7.7 oz), SpO2 94.00%. He looks systemically well. He is slightly drowsy. His speech is slurred. There are no focal neurologic signs. Heart sounds are present and normal. Lung fields are clear. Abdomen is soft and nontender.  Disposition: Transfer to the [facility] in [place], [place]. I've spoken with the physician there and he has graciously accepted the patient in transfer.  Discharge Orders    Future Orders Please Complete By Expires   Diet - low sodium heart healthy      Increase activity slowly  SignedWilson Singer Pager [phone]  06/20/2011, 1:24 PM

## 2011-06-20 NOTE — ED Notes (Signed)
[facility] order d/c'd. Pt failed swallow screen earlier this morning. Pt remains very drowsy. Received new order from EMD for 2 runs of Potassium IV.

## 2011-06-20 NOTE — ED Notes (Addendum)
Monitor continues to show Sinus Bradycardia.  Phlebotomist here to collect labs.

## 2011-06-20 NOTE — ED Notes (Addendum)
Offered urinal and patient attempted to void - unsuccessful.  States it hurts for him to try to void.  Abdomen appears distended.  Is uncomfortable with palpation - will use bladder scanner to assess.  Bladder scan indicates 98 ml

## 2011-06-20 NOTE — Progress Notes (Signed)
Stoke swallow eval. Passed. Pt still too drowsy to eat in my opinion. Will relay this info to MD.

## 2011-06-20 NOTE — ED Notes (Signed)
Boluses are complete of NS x 2 liters. IVF of NS resumed at 12ml/h

## 2011-06-20 NOTE — ED Notes (Signed)
Notified Dr. Colon Branch of patient's blood pressure dropping to 81/46 and heart rate dropping to 49.

## 2011-06-20 NOTE — Progress Notes (Signed)
Reported called to the [facility]. Pt being transported via [facility]. Pt alert, oriented and in stable condition at the time of transport. Pt being transported on tele, on room air with IVF running.

## 2011-06-20 NOTE — ED Provider Notes (Addendum)
History     CSN: [id number]  Arrival date & time 06/20/11  0355   First MD Initiated Contact with Patient 06/20/11 [phone]      Chief Complaint  Patient presents with  . Loss of Consciousness    (Consider location/radiation/quality/duration/timing/severity/associated sxs/prior treatment) HPI [name] is a 51 y.o. male with a h/o migraine headaches, HTN, Hep C, Cirrhosis, chronic pain,who presents to the Emergency Department complaining of syncope at home tonight. Patient states he was sitting at the table eating a hot dog and woke up on the floor. Girlfriend assisted him to the couch. He felt dizzy. Has had continuous headache  since discharge from the hospital 06/16/2011. Has had continued right facial droop and occasional slurred speech. He was seen and evaluated by neurologist, Dr. Gerilyn Pilgrim during his hospitalization. He felt the patient was having a complicated migraine. Patient is scheduled for follow up appointment on 5/29 2013.  Neuro Dr. Gerilyn Pilgrim   Past Medical History  Diagnosis Date  . Hypertension   . Hepatitis C   . Cirrhosis of liver   . Depression   . Gallstones   . Chronic back pain   . Left leg paresthesias   . Vertigo   . Hypercholesteremia   . Chronic neck pain   . Headache   . Complicated migraine 06/16/2011    Past Surgical History  Procedure Date  . Orthopedic surgery     "on my legs"    Family History  Problem Relation Age of Onset  . Diabetes Brother   . Diabetes Sister   . Diabetes Father   . Diabetes Mother   . Hypertension Mother   . Hypertension Father   . Hypertension Sister   . Hypertension Brother     History  Substance Use Topics  . Smoking status: Former Smoker -- 1.0 packs/day for 35 years    Quit date: 01/25/2009  . Smokeless tobacco: Not on file  . Alcohol Use: No     former alcoholic      Review of Systems  Constitutional: Negative for fever.       10 Systems reviewed and are negative for acute change except  as noted in the HPI.  HENT: Negative for congestion.   Eyes: Negative for discharge and redness.  Respiratory: Negative for cough and shortness of breath.   Cardiovascular: Negative for chest pain.  Gastrointestinal: Negative for vomiting and abdominal pain.  Musculoskeletal: Negative for back pain.  Skin: Negative for rash.  Neurological: Positive for dizziness, syncope, facial asymmetry and speech difficulty. Negative for numbness and headaches.  Psychiatric/Behavioral:       No behavior change.    Allergies  Bee venom  Home Medications   Current Outpatient Rx  Name Route Sig Dispense Refill  . AMLODIPINE BESYLATE 10 MG PO TABS Oral Take 5 mg by mouth daily.    . ASPIRIN 81 MG PO CHEW Oral Chew 81 mg by mouth daily.    Marland Kitchen BUTALBITAL-APAP-CAFFEINE 50-325-40 MG PO TABS Oral Take 2 tablets by mouth every 6 (six) hours as needed for headache. 30 tablet 0  . CARBOXYMETHYLCELL-HYPROMELLOSE 0.25-0.3 % OP GEL Ophthalmic Apply 1 application to eye 2 (two) times daily. 0.3% one application to both eyes twice a day for ocular dryness    . CETIRIZINE HCL 10 MG PO TABS Oral Take 10 mg by mouth daily.    Marland Kitchen VITAMIN D 1000 UNITS PO TABS Oral Take 1,000 Units by mouth daily.    . OMEGA-3 FATTY  ACIDS 1000 MG PO CAPS Oral Take 3 g by mouth 2 (two) times daily.    Marland Kitchen GABAPENTIN 300 MG PO CAPS Oral Take 600 mg by mouth 3 (three) times daily.    Marland Kitchen HYDROCHLOROTHIAZIDE 25 MG PO TABS Oral Take 25 mg by mouth daily.    Marland Kitchen LISINOPRIL 40 MG PO TABS Oral Take 40 mg by mouth daily.    Marland Kitchen MECLIZINE HCL 25 MG PO TABS Oral Take 25 mg by mouth 3 (three) times daily as needed.    Marland Kitchen QUETIAPINE FUMARATE 100 MG PO TABS Oral Take 50 mg by mouth at bedtime.    . SERTRALINE HCL 100 MG PO TABS Oral Take 200 mg by mouth every morning.    Marland Kitchen TRAMADOL HCL 50 MG PO TABS Oral Take 50 mg by mouth 2 (two) times daily as needed.    . TRAZODONE HCL 100 MG PO TABS Oral Take 200 mg by mouth at bedtime.      BP 81/49  Pulse 49   Temp(Src) 97.6 F (36.4 C) (Oral)  Resp 12  Wt 210 lb (95.255 kg)  SpO2 94%  Physical Exam  Nursing note and vitals reviewed. Constitutional:       Awake, alert, nontoxic appearance.  HENT:  Head: Atraumatic.  Eyes: Right eye exhibits no discharge. Left eye exhibits no discharge.  Neck: Neck supple.  Pulmonary/Chest: Effort normal. He exhibits no tenderness.  Abdominal: Soft. There is no tenderness. There is no rebound.  Musculoskeletal: He exhibits no tenderness.       Baseline ROM, no obvious new focal weakness.  Neurological:       Mental status and motor strength appears baseline for patient and situation.Patient is right handed. He has chronic left leg parasthesias.   Neuroexamination/Mental Status  The patient is alert and oriented x 3. Speech is slurred. Repetition and comprehension are intact.CRANIAL NERVES: I-not tested. II-Pupils are 3+ and 3+,Visual fields are full. III,IV,VI-Extraocular movements are intact. V-Facial movement is asymmetric, mild right facial droop.VIII-Intact bilaterally to finger rub. IX,X-Gag is present bilaterally and the palate elevates in the midline.XI-Sternocleidomastoid and trapezius are 5/5. XII-Tongue is in the midline with no fasciculations or atrophy. MOTOR EXAM; Neck flexion is 4/5, neck extension 5/5. Deltoids are 4/5 bilaterally,biceps 5/5 bilaterally, triceps 4/5 bilaterally, wrist extensors 4/5 on the right, 3/5 on the left, wrist flexors 4/5 bilaterally, Finger extensors 3/ bilaterally, finger flexors 3/5 bilaterally. Iliopsoas is 4/5 bilaterally,knee extensors 5/5 bilaterally, knee flexors 5/5 bilaterally, ankle dorsiflexors 4/5 bilaterally, ankle plantar flexors 5/5 bilaterally, toe extensors 4/5 bilaterally. DEEP TENDON REFLEXES: 3 in the upper extremities throughout,3 at the knees,3 at the ankles.SENSORY: Normal to sharp, proprioception,.  No jaw jerk or Hoffman's sign. Finger-to-nose is intact but slow.CEREBELLAR:Unable to doRapid alternating  movements due to falling asleep GAIT: not tested  Skin: Skin is warm and dry. No rash noted.  Psychiatric: He has a normal mood and affect.    ED Course  Procedures (including critical care time)  Results for orders placed during the hospital encounter of 06/20/11  GLUCOSE, CAPILLARY      Component Value Range   Glucose-Capillary 149 (*) 70 - 99 (mg/dL)  CBC      Component Value Range   WBC 8.7  4.0 - 10.5 (K/uL)   RBC 4.89  4.22 - 5.81 (MIL/uL)   Hemoglobin 14.4  13.0 - 17.0 (g/dL)   HCT 16.1  09.6 - 04.5 (%)   MCV 85.9  78.0 - 100.0 (fL)   MCH  29.4  26.0 - 34.0 (pg)   MCHC 34.3  30.0 - 36.0 (g/dL)   RDW 16.1  09.6 - 04.5 (%)   Platelets 113 (*) 150 - 400 (K/uL)   Ct Head Wo Contrast  06/20/2011  *RADIOLOGY REPORT*  Clinical Data:  Acute loss of consciousness.  CT HEAD WITHOUT CONTRAST  Technique: Contiguous axial images were obtained from the base of the skull through the vertex without intravenous contrast.  Comparison:   None.  Findings:  The ventricles and sulci are symmetrical without significant effacement, displacement, or dilatation. No mass effect or midline shift. No abnormal extra-axial fluid collections. The grey-white matter junction is distinct. Basal cisterns are not effaced. No acute intracranial hemorrhage. No depressed skull fractures. Vascular calcifications. mild cerebral atrophy.  IMPRESSION: No acute intracranial abnormality.  Original Report Authenticated By: Marlon Pel, M.D.    Date: 06/20/2011  0406  Rate: 53  Rhythm: sinus bradycardia  QRS Axis: normal  Intervals: QT prolonged  ST/T Wave abnormalities: nonspecific T wave changes  Conduction Disutrbances:none  Narrative Interpretation:   Old EKG Reviewed: changes noted c/w 06/15/11 T wave inversion in inferior and inferolateral leads now present.   4098 Patient continues to by hypotensive. He seems more confused. It may be partially due to dilaudid and his cirrhosis and ability to clear drugs.  Given IVF. 0605 Continued hypotension, confusion, slurred speech. Additional IVF ordered. Will arrange for admission for further evaluation.Reviewed previous hospitalization. Patient had MRI/MRA showing scattered punctate and patchy nonspecific white matter type changes probably related to result of small vessel disease.Carotid doppler stuies with less that 50% stenosis in the right and left carotids.  6:32 AM:  T/C to Dr. Onalee Hua, hospitalist, case discussed, including:  HPI, pertinent PM/SHx, VS/PE, dx testing, ED course and treatment.  Agreeable toadmission.  Requests to write temporary orders,step down bed to team 2..     MDM  Patient with a history of headaches and recent hospitalization with evaluation by neurology for complicated migraine, here with an episode of syncope. He has remained hypotensive since arrival time displaced IV fluids. He continues to have right facial droop and slurred speech. He was given analgesics with no relief of the headache that he has had continuously since discharge from the hospital. He states he had taken his nighttime medicines but cannot tell me what medicines those are. CT scan is unremarkable for any acute process. Labs that have returned are unremarkable. With no significant improvement in the emergency room I will arrange for admission.Spoke with Dr. Onalee Hua, hospitalist who has accepted the patient for admission. Pt stable in ED with no significant deterioration in condition.The patient appears reasonably stabilized for admission considering the current resources, flow, and capabilities available in the ED at this time, and I doubt any other [facility] requiring further screening and/or treatment in the ED prior to admission.  MDM Reviewed: previous chart, nursing note and vitals Reviewed previous: labs, ECG, x-ray, MRI, CT scan and ultrasound Interpretation: labs, ECG, x-ray and CT scan Total time providing critical care: 35 minutes.           Nicoletta Dress.  Colon Branch, MD 06/20/11 1191  Nicoletta Dress. Colon Branch, MD 06/20/11 [phone]

## 2011-06-20 NOTE — ED Notes (Signed)
Girlfriend states that patient was sitting at table eating and she states he fell on floor.

## 2011-09-22 ENCOUNTER — Ambulatory Visit (HOSPITAL_COMMUNITY)
Admission: RE | Admit: 2011-09-22 | Discharge: 2011-09-22 | Disposition: A | Discharge status: Home / Self Care | Payer: Non-veteran care | Source: Ambulatory Visit | Attending: Internal Medicine | Admitting: Internal Medicine

## 2011-09-22 DIAGNOSIS — M47812 Spondylosis without myelopathy or radiculopathy, cervical region: Secondary | ICD-10-CM | POA: Insufficient documentation

## 2011-09-22 DIAGNOSIS — M6281 Muscle weakness (generalized): Secondary | ICD-10-CM | POA: Insufficient documentation

## 2011-09-22 DIAGNOSIS — I10 Essential (primary) hypertension: Secondary | ICD-10-CM | POA: Insufficient documentation

## 2011-09-22 DIAGNOSIS — IMO0001 Reserved for inherently not codable concepts without codable children: Secondary | ICD-10-CM | POA: Insufficient documentation

## 2011-09-22 DIAGNOSIS — M542 Cervicalgia: Secondary | ICD-10-CM | POA: Insufficient documentation

## 2011-09-22 NOTE — Evaluation (Signed)
Physical Therapy Evaluation  Patient Details  Name: [name] MRN: [id number] Date of Birth: 1960-05-28  Today's Date: 09/22/2011 Time: 0940-1020 PT Time Calculation (min): 40 min Charges: 1 eval Visit#: 1  of 8   Re-eval: 10/22/11 Assessment Diagnosis: Cervical Spondylosis Next MD Visit: Dr. Genevie Ann  [facility]  Authorization Time Period: approved 8 visits from [place]  Authorization Visit#: 1  of 8    Past Medical History:  Past Medical History  Diagnosis Date  . Hypertension   . Hepatitis C   . Cirrhosis of liver   . Depression   . Gallstones   . Chronic back pain   . Left leg paresthesias   . Vertigo   . Hypercholesteremia   . Chronic neck pain   . Headache   . Complicated migraine 06/16/2011   Past Surgical History:  Past Surgical History  Procedure Date  . Orthopedic surgery     "on my legs"   Subjective Symptoms/Limitations Symptoms: See hx section for current history.  He is taking: 20 some medications Pertinent History: Pt is referred to PT for cervical spondylosis which has been painful for 7-8 years, but recently he has been suffering from migranes.  He reports that he is off balance, dizziness w/neck flexion, has speech changes, he is urinating frequently. His c/co's are heaviness to my neck especially when reading and looking down, increased pain to his neck and low back, increased dizziness and difficulty with balance.  Limitations: Reading Patient Stated Goals: I want my pain to go away.  Pain Assessment Currently in Pain?: Yes Pain Score:   8 Pain Location: Neck Pain Orientation: Posterior Pain Type: Chronic pain Effect of Pain on Daily Activities: he has difficulty looking down and reading.   Prior Functioning  Prior Function Vocation: Unemployed Comments: he enjoys reading and watching TV  Cognition/Observation Observation/Other Assessments Observations: significant swelling to R anterior scalene region.  Other  Assessments: UQ screen significant for L UE coordination difficulty, decreaseed B UE hand and shoulder strength, decreaed feeling to R anterior cervical region compared to L.   Sensation/Coordination/Flexibility/Functional Tests Coordination Gross Motor Movements are Fluid and Coordinated: No Coordination and Movement Description: has impaired coordniated movments with squating Finger Nose Finger Test: L UE effected with undershooting Heel Shin Test: normal  Functional Tests Functional Tests: NDI: 82%  Assessment Cervical AROM Cervical - Right Side Bend: 19 Cervical - Left Side Bend: 18.5 Cervical - Right Rotation: 19 Cervical - Left Rotation: 19 Cervical Strength Overall Cervical Strength Comments: taken in supine, capital flexion 4/5 Cervical Flexion: 4/5 Cervical Extension: 4/5 Cervical - Right Side Bend: 4/5 Cervical - Left Side Bend: 3/5 (most pain) Cervical - Right Rotation: 3+/5 Cervical - Left Rotation: 4/5 Palpation Palpation: Signfiant muscle spasms and fascial restrcitions to his R cervical and UT region and to B scapular region and cervical paraspinal region.  Mobility/Balance  Ambulation/Gait Ambulation/Gait: Yes Gait Pattern: Antalgic;Lateral hip instability Posture/Postural Control Posture/Postural Control: Postural limitations Postural Limitations: upper cross syndrome with head tilted to R   Exercise/Treatments Seated Exercises Neck Retraction: 5 reps Postural Training: scapular retraction and cervical retraction.  Education about proper seated posture    Physical Therapy Assessment and Plan PT Assessment and Plan Clinical Impression Statement: Pt is referred to PT for cervical spondylosis.  After evaluation I am concerned about his impaired L UE coordinated movements, increased swelling to his R anterior cervical region with significant increase in pain.  As well as his reports of dizziness, balance disorder,  speech changes and increased urinary frequency  and w/his significant medical history he may have other secondary causes of his neck pain.  At this time I will hold modalities for concervative treatment but will continue with manual therapy and ther-ex to decrease pain.  Pt will benefit from skilled therapeutic intervention in order to improve on the following deficits: Decreased coordination;Improper body mechanics;Pain;Impaired perceived functional ability;Increased fascial restricitons;Increased muscle spasms;Decreased strength;Decreased range of motion Rehab Potential: Fair Clinical Impairments Affecting Rehab Potential: secondary to multiple medical problems and NDI of 82% PT Frequency: Min 2X/week PT Duration: 4 weeks PT Treatment/Interventions: Therapeutic exercise;Neuromuscular re-education;Therapeutic activities;Patient/family education;Manual techniques PT Plan: No modalities secondary to today's findings.  Continue with manual techniques to decrease pain and spasms.  UBE, UT stretch, levator stretch, chin tucks, POE activities.  Progress to bands.     Goals Home Exercise Program Pt will Perform Home Exercise Program: Independently PT Goal: Perform Home Exercise Program - Progress: Goal set today PT Short Term Goals Time to Complete Short Term Goals: 4 weeks PT Short Term Goal 1: Pt will report pain less than 4/10 for improved QOL.  PT Short Term Goal 2: Pt will improve his NDI to less than 40% for improved QOL  PT Short Term Goal 3: Pt will present with mild muscular spasms and fascial restrictions.  PT Short Term Goal 4: Pt will improved cervical ROM to WNL and improve cervical strength to [facility] in order to sit with appropriate posture while reading.   Problem List Patient Active Problem List  Diagnosis  . Hypertension  . Hepatitis C  . Cirrhosis of liver  . Depression  . Chronic back pain  . Left leg paresthesias  . Hypercholesteremia  . Chronic neck pain  . Headache  . Complicated migraine  . Syncope  . ARF (acute  renal failure)  . Cervical spondylosis  . Neck pain    PT Plan of Care PT Home Exercise Plan: see scanned report PT Patient Instructions: Educated on importance of HEP and posture.  Explained our NS and Cx policy Consulted and Agree with Plan of Care: Patient    Annett Fabian, PT 09/22/2011, 12:21 PM  Physician Documentation Your signature is required to indicate approval of the treatment plan as stated above.  Please sign and either send electronically or make a copy of this report for your files and return this physician signed original.   Please mark one 1.__approve of plan  2. ___approve of plan with the following conditions.   ______________________________                                                          _____________________ Physician Signature                                                                                                             Date

## 2011-09-22 NOTE — Progress Notes (Signed)
Sent IE to [employer] [facility]  Fax #: [phone].

## 2011-09-24 ENCOUNTER — Ambulatory Visit (HOSPITAL_COMMUNITY)
Admission: RE | Admit: 2011-09-24 | Discharge: 2011-09-24 | Disposition: A | Discharge status: Home / Self Care | Payer: Non-veteran care | Source: Ambulatory Visit | Attending: Internal Medicine | Admitting: Internal Medicine

## 2011-09-24 NOTE — Progress Notes (Signed)
Physical Therapy Treatment Patient Details  Name: LORRIE STRAUCH MRN: [id number] Date of Birth: 10/08/1960  Today's Date: 09/24/2011 Time: 7829-5621 PT Time Calculation (min): 32 min Charges: 15' Manual, 17' TE Visit#: 2  of 8   Re-eval: 10/22/11    [facility]  Authorization Time Period: approved 8 visits from [place]  Authorization Visit#: 2  of 8    Subjective: Symptoms/Limitations Symptoms: Pt comes in with his current medications.  He continues to have significant pain.  He reports that he is having pain with swallowing, but continues to eat well.    Precautions/Restrictions     Exercise/Treatments Seated Exercises X to V: 5 reps W Back: 5 reps Shoulder Rolls: Backwards;20 reps Postural Training: scapular retraction and cervical retraction.  Education about proper seated posture Cervical Rotation x20 bilateral Lateral flexion x20 bilateral Prone Exercises Shoulder Extension: 10 reps Rows: 10 reps Upper Extremity Flexion with Stabilization: 5 reps  Manual Therapy Manual Therapy: Other (comment) Other Manual Therapy: STM and MFR to R anterior scalene region to decrease pain and fascial restriction. x13 minutes  Physical Therapy Assessment and Plan PT Assessment and Plan Clinical Impression Statement: Continues to have significant pain even with gentle massage to his R anterior cervical region.  Has a decrease in spasms, however has palpable swelling in that area.  Had moderate pain with all activities today, however was able to complete.  PT Plan: No modalities secondary to today's findings.  Continue with manual techniques to decrease pain and spasms.  UBE, UT stretch, levator stretch, chin tucks, POE activities.  Progress to bands.     Goals    Problem List Patient Active Problem List  Diagnosis  . Hypertension  . Hepatitis C  . Cirrhosis of liver  . Depression  . Chronic back pain  . Left leg paresthesias  . Hypercholesteremia  . Chronic neck pain    . Headache  . Complicated migraine  . Syncope  . ARF (acute renal failure)  . Cervical spondylosis  . Neck pain       GP    Naiya Corral 09/24/2011, 10:33 AM

## 2011-09-28 ENCOUNTER — Ambulatory Visit (HOSPITAL_COMMUNITY)
Admission: RE | Admit: 2011-09-28 | Discharge: 2011-09-28 | Disposition: A | Discharge status: Home / Self Care | Payer: Non-veteran care | Source: Ambulatory Visit | Attending: Internal Medicine | Admitting: Internal Medicine

## 2011-09-28 DIAGNOSIS — M6281 Muscle weakness (generalized): Secondary | ICD-10-CM | POA: Insufficient documentation

## 2011-09-28 DIAGNOSIS — M542 Cervicalgia: Secondary | ICD-10-CM | POA: Insufficient documentation

## 2011-09-28 DIAGNOSIS — IMO0001 Reserved for inherently not codable concepts without codable children: Secondary | ICD-10-CM | POA: Insufficient documentation

## 2011-09-28 DIAGNOSIS — I10 Essential (primary) hypertension: Secondary | ICD-10-CM | POA: Insufficient documentation

## 2011-09-28 NOTE — Progress Notes (Signed)
Physical Therapy Treatment Patient Details  Name: [name] MRN: [id number] Date of Birth: [birth year]  Today's Date: 09/28/2011 Time: 1305-1400 PT Time Calculation (min): 55 min  Visit#: 3  of 8   Re-eval: 10/22/11 Assessment Diagnosis: Cervical Spondylosis Next MD Visit: Dr. Genevie Ann Charge: therex 38', manual 15'  [facility]  Authorization Time Period: approved 8 visits from [place]  Authorization Visit#: 3  of 8    Subjective: Symptoms/Limitations Symptoms: Pt reported increased symptoms with medications over the weekend, stated he keeps dropping objects.  Pt stated pain 8/10 cervical with shooting pain up head and down arms. Pain Assessment Currently in Pain?: Yes Pain Score:   8 Pain Location: Neck Pain Orientation: Right;Left;Posterior  Objective:   Exercise/Treatments Stretches Upper Trapezius Stretch: 2 reps;30 seconds Levator Stretch: 2 reps;30 seconds Machines for Strengthening UBE (Upper Arm Bike): 4' @ 1.0 Baclwards Seated Exercises Neck Retraction: 10 reps;5 secs Cervical Rotation: 20 reps (Bilateral) Lateral Flexion: 20 reps (Bilateral) X to V: 10 reps W Back: 10 reps Shoulder Rolls: Backwards;20 reps Prone Exercises Shoulder Extension: 10 reps Rows: 10 reps Other Prone Exercise: POE with chin tuck and cervical rotation 5x 5"  Manual Therapy Other Manual Therapy: STM R cervical and scapular/ MFR to R anterior scalene region to decrease pain and fascial restriction. x15 minutes   Physical Therapy Assessment and Plan PT Assessment and Plan Clinical Impression Statement: Pt continues to be limited by pain, slow movements due to the cervical pain with movements.  Able to reduce spasms R anterior and lateral cervical region with palpable swelling noted.  Added stretches to POC, pt able to follow demonstration and vcing for proper form. PT Plan: No modalities.  Continue with manual techniques to decrease pain and spasms.  Continue with  prone therex for cervical ROM and strengthening.  Progress to therabands when ready.    Goals    Problem List Patient Active Problem List  Diagnosis  . Hypertension  . Hepatitis C  . Cirrhosis of liver  . Depression  . Chronic back pain  . Left leg paresthesias  . Hypercholesteremia  . Chronic neck pain  . Headache  . Complicated migraine  . Syncope  . ARF (acute renal failure)  . Cervical spondylosis  . Neck pain    PT - End of Session Activity Tolerance: Patient tolerated treatment well;Patient limited by pain General Behavior During Session: [facility] for tasks performed Cognition: [facility] for tasks performed  GP    Juel Burrow 09/28/2011, 3:00 PM

## 2011-09-30 ENCOUNTER — Inpatient Hospital Stay (HOSPITAL_COMMUNITY): Admission: RE | Admit: 2011-09-30 | Payer: Non-veteran care | Source: Ambulatory Visit

## 2011-10-05 ENCOUNTER — Ambulatory Visit (HOSPITAL_COMMUNITY)
Admission: RE | Admit: 2011-10-05 | Discharge: 2011-10-05 | Disposition: A | Discharge status: Home / Self Care | Payer: Non-veteran care | Source: Ambulatory Visit | Attending: Internal Medicine | Admitting: Internal Medicine

## 2011-10-05 NOTE — Progress Notes (Signed)
Physical Therapy Treatment Patient Details  Name: [name] MRN: [id number] Date of Birth: 05/07/1960  Today's Date: 10/05/2011 Time: 0903-0932 PT Time Calculation (min): 29 min  Visit#: 4  of 8   Re-eval: 10/22/11  Charge: therex 14', manual 15'  [facility]  Authorization Time Period: approved 8 visits from [place]  Authorization Visit#: 4  of 8    Subjective: Symptoms/Limitations Symptoms: Pt reported posterior cervical pain L>R pain scale 8/10. Pain Assessment Currently in Pain?: Yes Pain Score:   8 Pain Location: Neck Pain Orientation: Posterior;Left  Objective:   Exercise/Treatments Machines for Strengthening UBE (Upper Arm Bike): 4' @ 1.5 Baclwards Seated Exercises Shoulder Rolls: Backwards;20 reps Prone Exercises Shoulder Extension: 10 reps Rows: 10 reps Other Prone Exercise: chin tuck head lift 10x 5"  Manual Therapy Manual Therapy: Massage Massage: STM to cervical and scapular pt prone position, MFR to R anterior scalene region and SCM x 15 min  Physical Therapy Assessment and Plan PT Assessment and Plan Clinical Impression Statement: Session focus on manual techniques to reduce pain and enable pt to complete therex with improved fluency with cervical movements.  Able to reduce spasms anterior scalenes and SCM bilateral with improved posture, better form/fluency with therex activities and pt stated pain reduced at end of session. PT Plan: No modalities. Continue with manual techniques to decrease pain and spasms. Continue with prone therex for cervical ROM and strengthening. Progress to therabands when ready.    Goals    Problem List Patient Active Problem List  Diagnosis  . Hypertension  . Hepatitis C  . Cirrhosis of liver  . Depression  . Chronic back pain  . Left leg paresthesias  . Hypercholesteremia  . Chronic neck pain  . Headache  . Complicated migraine  . Syncope  . ARF (acute renal failure)  . Cervical spondylosis  . Neck  pain    PT - End of Session Activity Tolerance: Patient tolerated treatment well;Patient limited by pain General Behavior During Session: [facility] for tasks performed Cognition: [facility] for tasks performed  GP    Juel Burrow 10/05/2011, 11:43 AM

## 2011-10-07 ENCOUNTER — Ambulatory Visit (HOSPITAL_COMMUNITY)
Admission: RE | Admit: 2011-10-07 | Discharge: 2011-10-07 | Disposition: A | Discharge status: Home / Self Care | Payer: Non-veteran care | Source: Ambulatory Visit | Attending: Internal Medicine | Admitting: Internal Medicine

## 2011-10-07 NOTE — Progress Notes (Signed)
Physical Therapy Treatment Patient Details  Name: [name] MRN: [id number] Date of Birth: 1960/10/21  Today's Date: 10/07/2011 Time: 4098-1191 PT Time Calculation (min): 34 min Visit#: 5  of 8   Re-eval: 10/22/11 Charges:  therex 14', cervical traction X 12', MHP X 1 unit    [facility]ization: [facility]  Authorization Time Period: 8 visits approved from [place]  Authorization Visit#: 5  of 8    Subjective: Symptoms/Limitations Symptoms: Pt. was 72' late for appt.  States his pain remains 8/10 in cervical area. Pain Assessment Currently in Pain?: Yes Pain Score:   8 Pain Location: Neck Pain Orientation: Right;Left   Exercise/Treatments Machines for Strengthening UBE (Upper Arm Bike): 4' @ 1.5 Baclwards Seated Exercises Shoulder Rolls: Backwards;20 reps Prone Exercises Shoulder Extension: 10 reps Rows: 10 reps Other Prone Exercise: chin tuck head lift 10x 5"   Modalities Modalities: Traction Traction Type of Traction: Cervical Min (lbs): n/a Max (lbs): 12 Hold Time: static hold X 12 minutes Rest Time: n/a Time: 10' (12' total)  Physical Therapy Assessment and Plan PT Assessment and Plan Clinical Impression Statement: Pt. continues with high level of pain (8/10).  Pt. has had 4 treatments with massage; changed modality to cervical traction with MHP to see if helps decrease symptoms and pain.  Pt. reported immediate pain reduction of 1 level at end of session.  To assess pain next visits/results of traction. PT Plan: Continue X 3 more visits (as approved by [place]); assess effectiveness of traction next visit.     Problem List Patient Active Problem List  Diagnosis  . Hypertension  . Hepatitis C  . Cirrhosis of liver  . Depression  . Chronic back pain  . Left leg paresthesias  . Hypercholesteremia  . Chronic neck pain  . Headache  . Complicated migraine  . Syncope  . ARF (acute renal failure)  . Cervical spondylosis  . Neck pain    PT - End of  Session Activity Tolerance: Patient tolerated treatment well General Behavior During Session: [facility] for tasks performed Cognition: [facility] for tasks performed   Lurena Nida, PTA/CLT 10/07/2011, 9:44 AM

## 2011-10-12 ENCOUNTER — Ambulatory Visit (HOSPITAL_COMMUNITY)
Admission: RE | Admit: 2011-10-12 | Discharge: 2011-10-12 | Disposition: A | Discharge status: Home / Self Care | Payer: Non-veteran care | Source: Ambulatory Visit | Attending: Internal Medicine | Admitting: Internal Medicine

## 2011-10-12 NOTE — Progress Notes (Signed)
Physical Therapy Treatment Patient Details  Name: [name] MRN: [id number] Date of Birth: Oct 11, 1960  Today's Date: 10/12/2011 Time: 0900-0953 PT Time Calculation (min): 53 min Traction x1, NMR x 30' 8' TE Visit#: 6  of 8   Re-eval: 10/22/11   [facility]: [facility]  Authorization Time Period: 8 visits approved from [place]  Authorization Visit#: 6  of 8    Subjective: Symptoms/Limitations Symptoms: Reports that his neck has been cramped up all weekend.  He comes in with slouched posture and does not report adherence with HEP.  Pain Assessment Currently in Pain?: Yes Pain Location: Neck  Precautions/Restrictions     Exercise/Treatments Machines for Strengthening UBE (Upper Arm Bike): 5' @ 1.5 Baclwards Theraband Exercises Scapula Retraction: 10 reps;Green Shoulder Extension: 10 reps;Green Rows: 10 reps;Green Seated Exercises X to V: 10 reps (2/ AA to LUE secondary to weakness) W Back: 15 reps (TC for LT and MT activivation ) Shoulder Rolls: Backwards;20 reps (TC for LT and MT activivation ) Other Seated Exercise: L Shoulder flexion x10 w/10 pulses at end range; ABD x10 w/10 pulses at end range Prone Exercises W Back: 5 reps (TC for LT and MT activivation ) Shoulder Extension: 15 reps (TC for LT and MT activivation ) Rows: 10 reps (TC for LT and MT activivation ) Other Prone Exercise: POE: R and L rotation x10, R and L SB x10, D1 pattern x5 each, SA x10; all required C's and VC's for proper motion.  Modalities Modalities: Traction Manual Therapy Manual Therapy: Joint mobilization Joint Mobilization: Seated Grade II PA to C5-T5 w/STM after  Traction Type of Traction: Cervical Min (lbs): 8 Max (lbs): 13 Hold Time: static hold X 16 minutes Rest Time: n/a Time: 16 minutes  Physical Therapy Assessment and Plan PT Assessment and Plan Clinical Impression Statement: Pt had decreased pain and improved ROM after NMR and TE today.  Continues to require mod multimodal  cueing NM facilitaion to scapular stabaizers for appropriate posture and movement.  Most limited by decreased L shoulder ABD. Added exercises to improve L shoulder strength.  PT Plan: Continue X visits (as approved by [place]); assess effectiveness of traction next visit.  Continue with scapular and cervical strengthening.     Goals    Problem List Patient Active Problem List  Diagnosis  . Hypertension  . Hepatitis C  . Cirrhosis of liver  . Depression  . Chronic back pain  . Left leg paresthesias  . Hypercholesteremia  . Chronic neck pain  . Headache  . Complicated migraine  . Syncope  . ARF (acute renal failure)  . Cervical spondylosis  . Neck pain    PT - End of Session Activity Tolerance: Patient tolerated treatment well General Behavior During Session: [facility] for tasks performed Cognition: [facility] for tasks performed PT Plan of Care PT Patient Instructions: Educated importance of postural strength and role in pain.  To encourage pt to continue with strengthening cercial and scapular muscles to improve posture and eventually rid pain.  Consulted and Agree with Plan of Care: Patient  GP    Shaily Librizzi 10/12/2011, 10:25 AM

## 2011-10-14 ENCOUNTER — Ambulatory Visit (HOSPITAL_COMMUNITY)
Admission: RE | Admit: 2011-10-14 | Discharge: 2011-10-14 | Disposition: A | Discharge status: Home / Self Care | Payer: Non-veteran care | Source: Ambulatory Visit | Attending: Internal Medicine | Admitting: Internal Medicine

## 2011-10-14 NOTE — Progress Notes (Signed)
Physical Therapy Treatment Patient Details  Name: [name] MRN: [id number] Date of Birth: 01/19/1961  Today's Date: 10/14/2011 Time: 0900-0955 PT Time Calculation (min): 55 min Charges: 13 TE, 25' NMR, 1 Traction  Visit#: 7  of 8   Re-eval: 10/22/11    [facility]ization: [facility]  Authorization Time Period: 8 visits approved from [place]  Authorization Visit#: 7  of 8    Subjective: Symptoms/Limitations Symptoms: Pt reports that his neck is still a little cramped.  Reports relief from cervical traction.  Pain Assessment Currently in Pain?: Yes Pain Location: Neck  Exercise/Treatments Machines for Strengthening UBE (Upper Arm Bike): 6' backwards @ 2.0 (w/sacral towel roll to improve posture) Theraband Exercises Scapula Retraction: 20 reps;Blue (TC's and VC's for posture) Shoulder Extension: 20 reps;Blue (TC's and VC's for posture) Rows: 20 reps;Blue (TC's and VC's for posture) Shoulder External Rotation: 10 reps;Blue (Right and Left) Seated Exercises Shoulder Rolls: Backwards;20 reps Other Seated Exercise: R and L shoulder flexion with cueing for posture x20 each Other Seated Exercise: SA presses w/RTB x20 Prone Exercises Other Prone Exercise: POE: R and L rotation x10, flexion and extension x10, D1 pattern x10 each, SA 10x5 sec holds; all required C's and VC's for proper motion.  Traction Type of Traction: Cervical Min (lbs): 8 Max (lbs): 15 Hold Time: STATIC Rest Time: STATIC  Time: 16  Physical Therapy Assessment and Plan PT Assessment and Plan Clinical Impression Statement: Pt reports that he had decrased pain after activities today.  At end of session pt able to verbalize appropriate posture.  Continues to require NM facilitation and cueing for proper posture and technique during exercise secondary to impaired coordination to scapular stabilzers and cervical musculature.  PT Plan: Re-eval next visit.     Goals    Problem List Patient Active Problem List    Diagnosis  . Hypertension  . Hepatitis C  . Cirrhosis of liver  . Depression  . Chronic back pain  . Left leg paresthesias  . Hypercholesteremia  . Chronic neck pain  . Headache  . Complicated migraine  . Syncope  . ARF (acute renal failure)  . Cervical spondylosis  . Neck pain    PT - End of Session Activity Tolerance: Patient tolerated treatment well General Behavior During Session: [facility] for tasks performed Cognition: [facility] for tasks performed PT Plan of Care PT Patient Instructions: Discussed importance of posture and strengthening postural muscles. Encouraged pt to continue with moving throughout the day to decrease pain.   Consulted and Agree with Plan of Care: Patient  Annett Fabian, PT 10/14/2011, 9:46 AM

## 2011-10-19 ENCOUNTER — Ambulatory Visit (HOSPITAL_COMMUNITY)
Admission: RE | Admit: 2011-10-19 | Discharge: 2011-10-19 | Disposition: A | Discharge status: Home / Self Care | Payer: Non-veteran care | Source: Ambulatory Visit | Attending: Internal Medicine | Admitting: Internal Medicine

## 2011-10-19 NOTE — Progress Notes (Signed)
D/C letter sent to:  Mikael Spray [facility] Attn: FEE BASIS Fax #: [phone]  Annett Fabian, PT

## 2011-10-19 NOTE — Evaluation (Signed)
Physical Therapy Discharge summary  Patient Details  Name: [name] MRN: [id number] Date of Birth: [birth year]  Today's Date: 10/19/2011 Time: 4098-1191 PT Time Calculation (min): 30 min Charges: 1 MMT, 1 ROM, 15' Self Care, 10' Manual   Visit#: 8  of 8   Re-eval: 10/22/11 Assessment Diagnosis: Cervical Spondylosis Next MD Visit: Dr. Genevie Macdonald  Authorization: [facility]  Authorization Time Period: 8 visits approved from [place]  Authorization Visit#: 8  of 8    Subjective Symptoms/Limitations Symptoms: Pt reports that he has had relief from cervical traction, however it is not mainitaining.  He states that he is having a little tingling to his L UE.  Reports he will be having an MRI of his back.  Reports that he continues to have issues with his speech and swallowing raw vegatbles.  Pain Assessment Currently in Pain?: Yes Pain Score:   8 Pain Location: Neck Pain Orientation: Right;Left Pain Type: Chronic pain  Sensation/Coordination/Flexibility/Functional Tests Functional Tests Functional Tests: NDI: 66% (was 82%)  Assessment Cervical AROM Cervical - Right Side Bend: 13.5 (w/increased pain (was 19 cm) ) Cervical - Left Side Bend: 15 cm (with pain, was 18.5 cm) Cervical - Right Rotation: 15 cm (was 19 cm ) Cervical - Left Rotation: 17 cm  (was 19) Cervical Strength Overall Cervical Strength Comments: taken in supine, capital flexion 4/5 (was 4/5) Cervical Flexion: 4/5 (was 4/5) Cervical Extension: 4/5 (was 4/5) Cervical - Right Side Bend: 4/5 (was 4/5) Cervical - Left Side Bend: 4/5 (was 3/5, continues to have pain) Cervical - Right Rotation: 4/5 (was 3+/5) Cervical - Left Rotation: 4/5 (was 4/5) Palpation Palpation: continues to have nodule to R anterior cervical lymphnode region.    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  Reports decreased numbness to L arm afterwards.   Physical Therapy Assessment and Plan PT Assessment and Plan Clinical Impression Statement: Treatment consisted of re-eval and discussion of posture and manual techniques to relieve pain.  Pt with decreased symptoms to L UE after manual treatment.  [name] has attended 8 allowed PT visits to address cervical pain with the following findings: continues to have 8-9/10 pain to cervical region, increased R anterior cervical lymph node nodule that does not seem to be muscular related, has increased back pain, has improved his cervical ROM, mild improvements in strength, continues to require mod-max cueing for posture  but is able to independently verbalize proper posture pattern.  At this time pt will be D/C from PT w/HEP to continue.  Recommend f/u w/MD to discuss nodule to R anterior cervical lymph node region and difficulty with swallowing and speech.  PT Plan: D/C    Goals Home Exercise Program Pt will Perform Home Exercise Program: Independently PT Short Term Goals Time to Complete Short Term Goals: 4 weeks PT Short Term Goal 1: Pt will report pain less than 4/10 for improved QOL.  PT Short Term Goal 1 - Progress: Not met (8-9/10 pain on average) PT Short Term Goal 2: Pt will improve his NDI to less than 40% for improved QOL  PT Short Term Goal 2 - Progress: Not met (66%) PT Short Term Goal 3: Pt will present with mild muscular spasms and fascial restrictions.  PT Short Term Goal 3 - Progress: Met (mild muscular restrictions, w/significant R  nodel to ant cer) PT Short Term Goal 4: Pt will improved cervical ROM to WNL and improve cervical strength to [facility] in order to sit with appropriate posture while reading.  PT Short Term Goal 4 - Progress: Partly met (improved ROM, continues to require max cueing for posture)  Problem List Patient  Active Problem List  Diagnosis  . Hypertension  . Hepatitis C  . Cirrhosis of liver  . Depression  . Chronic back pain  . Left leg paresthesias  . Hypercholesteremia  . Chronic neck pain  . Headache  . Complicated migraine  . Syncope  . ARF (acute renal failure)  . Cervical spondylosis  . Neck pain    PT - End of Session Activity Tolerance: Patient tolerated treatment well General Behavior During Session: [name] for tasks performed Cognition: [facility] for tasks performed PT Plan of Care PT Patient Instructions: Discussed importance of posture and strengthening postural muscles. Encouraged pt to continue with moving throughout the day to decrease pain.  Pt able to independently verablize proper posture.  Consulted and Agree with Plan of Care: Patient  GP    [name] 10/19/2011, 12:21 PM  Physician Documentation Your signature is required to indicate approval of the treatment plan as stated above.  Please sign and either send electronically or make a copy of this report for your files and return this physician signed original.   Please mark one 1.__approve of plan  2. ___approve of plan with the following conditions.   ______________________________                                                          _____________________ Physician Signature                                                                                                             Date

## 2012-10-10 ENCOUNTER — Emergency Department (HOSPITAL_COMMUNITY): Payer: Non-veteran care

## 2012-10-10 ENCOUNTER — Encounter (HOSPITAL_COMMUNITY): Payer: Self-pay

## 2012-10-10 ENCOUNTER — Inpatient Hospital Stay (HOSPITAL_COMMUNITY)
Admission: EM | Admit: 2012-10-10 | Discharge: 2012-10-13 | DRG: 312 | Disposition: A | Discharge status: Home / Self Care | Payer: Non-veteran care | Attending: Internal Medicine | Admitting: Internal Medicine

## 2012-10-10 DIAGNOSIS — Z833 Family history of diabetes mellitus: Secondary | ICD-10-CM

## 2012-10-10 DIAGNOSIS — R202 Paresthesia of skin: Secondary | ICD-10-CM

## 2012-10-10 DIAGNOSIS — M542 Cervicalgia: Secondary | ICD-10-CM | POA: Diagnosis present

## 2012-10-10 DIAGNOSIS — Z87891 Personal history of nicotine dependence: Secondary | ICD-10-CM

## 2012-10-10 DIAGNOSIS — K7460 Unspecified cirrhosis of liver: Secondary | ICD-10-CM | POA: Diagnosis present

## 2012-10-10 DIAGNOSIS — K5900 Constipation, unspecified: Secondary | ICD-10-CM | POA: Diagnosis present

## 2012-10-10 DIAGNOSIS — E7800 Pure hypercholesterolemia, unspecified: Secondary | ICD-10-CM

## 2012-10-10 DIAGNOSIS — Z91038 Other insect allergy status: Secondary | ICD-10-CM

## 2012-10-10 DIAGNOSIS — I10 Essential (primary) hypertension: Secondary | ICD-10-CM

## 2012-10-10 DIAGNOSIS — F3289 Other specified depressive episodes: Secondary | ICD-10-CM | POA: Diagnosis present

## 2012-10-10 DIAGNOSIS — M47812 Spondylosis without myelopathy or radiculopathy, cervical region: Secondary | ICD-10-CM

## 2012-10-10 DIAGNOSIS — Z951 Presence of aortocoronary bypass graft: Secondary | ICD-10-CM

## 2012-10-10 DIAGNOSIS — R55 Syncope and collapse: Secondary | ICD-10-CM | POA: Diagnosis present

## 2012-10-10 DIAGNOSIS — I959 Hypotension, unspecified: Secondary | ICD-10-CM | POA: Diagnosis present

## 2012-10-10 DIAGNOSIS — Z7982 Long term (current) use of aspirin: Secondary | ICD-10-CM

## 2012-10-10 DIAGNOSIS — N189 Chronic kidney disease, unspecified: Secondary | ICD-10-CM | POA: Diagnosis present

## 2012-10-10 DIAGNOSIS — F329 Major depressive disorder, single episode, unspecified: Secondary | ICD-10-CM

## 2012-10-10 DIAGNOSIS — I2510 Atherosclerotic heart disease of native coronary artery without angina pectoris: Secondary | ICD-10-CM | POA: Diagnosis present

## 2012-10-10 DIAGNOSIS — J209 Acute bronchitis, unspecified: Secondary | ICD-10-CM | POA: Diagnosis not present

## 2012-10-10 DIAGNOSIS — R51 Headache: Secondary | ICD-10-CM

## 2012-10-10 DIAGNOSIS — G8929 Other chronic pain: Secondary | ICD-10-CM | POA: Diagnosis present

## 2012-10-10 DIAGNOSIS — J9600 Acute respiratory failure, unspecified whether with hypoxia or hypercapnia: Secondary | ICD-10-CM | POA: Diagnosis not present

## 2012-10-10 DIAGNOSIS — I129 Hypertensive chronic kidney disease with stage 1 through stage 4 chronic kidney disease, or unspecified chronic kidney disease: Secondary | ICD-10-CM | POA: Diagnosis present

## 2012-10-10 DIAGNOSIS — G43109 Migraine with aura, not intractable, without status migrainosus: Secondary | ICD-10-CM

## 2012-10-10 DIAGNOSIS — M549 Dorsalgia, unspecified: Secondary | ICD-10-CM | POA: Diagnosis present

## 2012-10-10 DIAGNOSIS — E876 Hypokalemia: Secondary | ICD-10-CM | POA: Diagnosis not present

## 2012-10-10 DIAGNOSIS — N179 Acute kidney failure, unspecified: Secondary | ICD-10-CM | POA: Diagnosis present

## 2012-10-10 DIAGNOSIS — B1920 Unspecified viral hepatitis C without hepatic coma: Secondary | ICD-10-CM | POA: Diagnosis present

## 2012-10-10 DIAGNOSIS — I951 Orthostatic hypotension: Principal | ICD-10-CM | POA: Diagnosis present

## 2012-10-10 DIAGNOSIS — Z8249 Family history of ischemic heart disease and other diseases of the circulatory system: Secondary | ICD-10-CM

## 2012-10-10 DIAGNOSIS — E860 Dehydration: Secondary | ICD-10-CM | POA: Diagnosis present

## 2012-10-10 DIAGNOSIS — Z79899 Other long term (current) drug therapy: Secondary | ICD-10-CM

## 2012-10-10 DIAGNOSIS — Z23 Encounter for immunization: Secondary | ICD-10-CM

## 2012-10-10 HISTORY — DX: Cerebral infarction, unspecified: I63.9

## 2012-10-10 HISTORY — DX: Malignant (primary) neoplasm, unspecified: C80.1

## 2012-10-10 HISTORY — DX: Acute myocardial infarction, unspecified: I21.9

## 2012-10-10 HISTORY — DX: Atherosclerotic heart disease of native coronary artery without angina pectoris: I25.10

## 2012-10-10 LAB — HEPATIC FUNCTION PANEL
ALT: 48 U/L (ref 0–53)
AST: 65 U/L — ABNORMAL HIGH (ref 0–37)
Albumin: 3.2 g/dL — ABNORMAL LOW (ref 3.5–5.2)
Bilirubin, Direct: 0.2 mg/dL (ref 0.0–0.3)
Total Protein: 7.1 g/dL (ref 6.0–8.3)

## 2012-10-10 LAB — CBC WITH DIFFERENTIAL/PLATELET
Basophils Absolute: 0 10*3/uL (ref 0.0–0.1)
Basophils Relative: 0 % (ref 0–1)
HCT: 41.2 % (ref 39.0–52.0)
Lymphocytes Relative: 42 % (ref 12–46)
MCHC: 33 g/dL (ref 30.0–36.0)
Monocytes Absolute: 0.8 10*3/uL (ref 0.1–1.0)
Neutro Abs: 4.6 10*3/uL (ref 1.7–7.7)
Neutrophils Relative %: 44 % (ref 43–77)
Platelets: 205 10*3/uL (ref 150–400)
RDW: 14.1 % (ref 11.5–15.5)
WBC: 10.6 10*3/uL — ABNORMAL HIGH (ref 4.0–10.5)

## 2012-10-10 LAB — BASIC METABOLIC PANEL
CO2: 28 mEq/L (ref 19–32)
Chloride: 100 mEq/L (ref 96–112)
Creatinine, Ser: 2.49 mg/dL — ABNORMAL HIGH (ref 0.50–1.35)
GFR calc Af Amer: 32 mL/min — ABNORMAL LOW (ref >90)
Potassium: 3.4 mEq/L — ABNORMAL LOW (ref 3.5–5.1)
Sodium: 136 mEq/L (ref 135–145)

## 2012-10-10 LAB — TROPONIN I: Troponin I: <0.3 ng/mL (ref <0.30)

## 2012-10-10 LAB — D-DIMER, QUANTITATIVE: D-Dimer, Quant: 2 ug/mL-FEU — ABNORMAL HIGH (ref 0.00–0.48)

## 2012-10-10 LAB — LIPASE, BLOOD: Lipase: 34 U/L (ref 11–59)

## 2012-10-10 MED ORDER — SODIUM CHLORIDE 0.9 % IV BOLUS (SEPSIS)
1000.0000 mL | Freq: Once | INTRAVENOUS | Status: AC
  Administered 2012-10-10: 1000 mL via INTRAVENOUS

## 2012-10-10 NOTE — ED Notes (Signed)
MD at bedside. 

## 2012-10-10 NOTE — ED Notes (Signed)
Pt reports woke up feeling dizzy today.  Reports has passed out x 4 in the past hour.  Also reports chest pain today.  Pt says had triple bypass surgery 1 month ago.

## 2012-10-10 NOTE — ED Notes (Signed)
Pt c/o intermittent centralized chest pain that began this morning. Pt states she woke up feeling lightheaded and diaphoretic. Pt also reports increased sensitivity to light. Pt denies SOB, nausea, headache, numbness. Pt had triple bypass surgery approximately 1 month and reports this is the first time he's had these symptoms since the procedure. Pt states he took "half a pain pill" this morning but is unsure of the name of the medicine. Pt states the medication made him feel "woozy" but denies relied of pain. Pt describes pain in chest as tight.

## 2012-10-10 NOTE — ED Provider Notes (Signed)
CSN: [id number]     Arrival date & time 10/10/12  1758 History   First MD Initiated Contact with Patient 10/10/12 1841     Chief Complaint  Patient presents with  . Chest Pain   (Consider location/radiation/quality/duration/timing/severity/associated sxs/prior Treatment) Patient is a 52 y.o. male presenting with chest pain. The history is provided by the patient (the pt states he was dizzy this am and then later in the day he had chest pain and then had some syncope.  pt has a hx of bypass surgery 6 weeks ago).  Chest Pain Pain location:  L chest Pain quality: aching   Pain radiates to:  Does not radiate Pain radiates to the back: no   Pain severity:  Mild Onset quality:  Sudden Timing:  Intermittent Associated symptoms: dizziness   Associated symptoms: no abdominal pain, no back pain, no cough, no fatigue and no headache     Past Medical History  Diagnosis Date  . Hypertension   . Hepatitis C   . Cirrhosis of liver   . Depression   . Gallstones   . Chronic back pain   . Left leg paresthesias   . Vertigo   . Hypercholesteremia   . Chronic neck pain   . Headache(784.0)   . Complicated migraine 06/16/2011   Past Surgical History  Procedure Laterality Date  . Orthopedic surgery      "on my legs"  . Bypass graft      triple bypass surgery   Family History  Problem Relation Age of Onset  . Diabetes Brother   . Diabetes Sister   . Diabetes Father   . Diabetes Mother   . Hypertension Mother   . Hypertension Father   . Hypertension Sister   . Hypertension Brother    History  Substance Use Topics  . Smoking status: Former Smoker -- 1.00 packs/day for 35 years    Quit date: 01/25/2009  . Smokeless tobacco: Not on file  . Alcohol Use: No     Comment: former alcoholic    Review of Systems  Constitutional: Negative for appetite change and fatigue.  HENT: Negative for congestion, sinus pressure and ear discharge.   Eyes: Negative for discharge.  Respiratory:  Negative for cough.   Cardiovascular: Positive for chest pain.  Gastrointestinal: Negative for abdominal pain and diarrhea.  Genitourinary: Negative for frequency and hematuria.  Musculoskeletal: Negative for back pain.  Skin: Negative for rash.  Neurological: Positive for dizziness. Negative for seizures and headaches.  Psychiatric/Behavioral: Negative for hallucinations.    Allergies  Bee venom  Home Medications   Current Outpatient Rx  Name  Route  Sig  Dispense  Refill  . amLODipine (NORVASC) 10 MG tablet   Oral   Take 5 mg by mouth daily.         Marland Kitchen aspirin 81 MG chewable tablet   Oral   Chew 81 mg by mouth every morning.          . cetirizine (ZYRTEC) 10 MG tablet   Oral   Take 10 mg by mouth every morning.          . cholecalciferol (VITAMIN D) 1000 UNITS tablet   Oral   Take 1,000 Units by mouth daily.         . fish oil-omega-3 fatty acids 1000 MG capsule   Oral   Take 3 g by mouth 2 (two) times daily.         Marland Kitchen gabapentin (NEURONTIN) 300 MG capsule  Oral   Take 600 mg by mouth 3 (three) times daily.         . hydrochlorothiazide (HYDRODIURIL) 25 MG tablet   Oral   Take 25 mg by mouth daily.         Marland Kitchen lisinopril (PRINIVIL,ZESTRIL) 40 MG tablet   Oral   Take 40 mg by mouth daily.         . magnesium oxide (MAG-OX) 400 MG tablet   Oral   Take 800 mg by mouth daily.         . pravastatin (PRAVACHOL) 20 MG tablet   Oral   Take 20 mg by mouth daily.         . QUEtiapine (SEROQUEL) 200 MG tablet   Oral   Take 100 mg by mouth at bedtime.         . sertraline (ZOLOFT) 100 MG tablet   Oral   Take 200 mg by mouth every morning.         . traMADol (ULTRAM) 50 MG tablet   Oral   Take 50 mg by mouth 2 (two) times daily as needed.         . traZODone (DESYREL) 150 MG tablet   Oral   Take 300 mg by mouth at bedtime.         . Carboxymethylcell-Hypromellose (GENTEAL) 0.25-0.3 % GEL   Ophthalmic   Apply 1 application to  eye 2 (two) times daily. 0.3% one application to both eyes twice a day for ocular dryness          BP 110/78  Pulse 89  Temp(Src) 98 F (36.7 C) (Oral)  Resp 20  Ht 6\' 1"  (1.854 m)  Wt 220 lb (99.791 kg)  BMI 29.03 kg/m2  SpO2 96% Physical Exam  Constitutional: He is oriented to person, place, and time. He appears well-developed.  HENT:  Head: Normocephalic.  Eyes: Conjunctivae and EOM are normal. No scleral icterus.  Neck: Neck supple. No thyromegaly present.  Cardiovascular: Normal rate and regular rhythm.  Exam reveals no gallop and no friction rub.   No murmur heard. Pulmonary/Chest: No stridor. He has no wheezes. He has no rales. He exhibits no tenderness.  Abdominal: He exhibits no distension. There is no tenderness. There is no rebound.  Musculoskeletal: Normal range of motion. He exhibits no edema.  Lymphadenopathy:    He has no cervical adenopathy.  Neurological: He is oriented to person, place, and time. Coordination normal.  Skin: No rash noted. No erythema.  Psychiatric: He has a normal mood and affect. His behavior is normal.    ED Course  Procedures (including critical care time) Labs Review Labs Reviewed  CBC WITH DIFFERENTIAL - Abnormal; Notable for the following:    WBC 10.6 (*)    Lymphs Abs 4.4 (*)    Eosinophils Relative 7 (*)    Eosinophils Absolute 0.8 (*)    All other components within normal limits  BASIC METABOLIC PANEL - Abnormal; Notable for the following:    Potassium 3.4 (*)    Creatinine, Ser 2.49 (*)    GFR calc non Af Amer 28 (*)    GFR calc Af Amer 32 (*)    All other components within normal limits  HEPATIC FUNCTION PANEL - Abnormal; Notable for the following:    Albumin 3.2 (*)    AST 65 (*)    Alkaline Phosphatase 121 (*)    Indirect Bilirubin 0.2 (*)    All other components within normal  limits  TROPONIN I  TROPONIN I   Imaging Review Dg Chest Port 1 View  10/10/2012   *RADIOLOGY REPORT*  Clinical Data: Chest pain   PORTABLE CHEST - 1 VIEW  Comparison: Prior chest x-ray 06/20/2011  Findings: Stable mild cardiomegaly.  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  Mild pulmonary vascular congestion without overt edema.  No large pleural effusion.  No pneumothorax.  Degenerative changes in the left shoulder.  No acute osseous abnormality.  IMPRESSION: Stable cardiomegaly and pulmonary vascular congestion without overt edema.   Original Report Authenticated By: Malachy Moan, M.D.    Date: 10/10/2012  Rate: 84  Rhythm: sinus tachycardia  QRS Axis: normal  Intervals: normal  ST/T Wave abnormalities: inverted t waves inf and lateral  Conduction Disutrbances:none  Narrative Interpretation:   Old EKG Reviewed: worsening inverted t waves  I spoke with Dr. Rito Ehrlich hospitalist and he will see the pt in the er MDM  syncope    Benny Lennert, MD 10/10/12 2223

## 2012-10-11 ENCOUNTER — Inpatient Hospital Stay (HOSPITAL_COMMUNITY): Payer: Non-veteran care

## 2012-10-11 ENCOUNTER — Encounter (HOSPITAL_COMMUNITY): Payer: Self-pay | Admitting: Internal Medicine

## 2012-10-11 DIAGNOSIS — Z951 Presence of aortocoronary bypass graft: Secondary | ICD-10-CM

## 2012-10-11 DIAGNOSIS — I2510 Atherosclerotic heart disease of native coronary artery without angina pectoris: Secondary | ICD-10-CM | POA: Diagnosis present

## 2012-10-11 DIAGNOSIS — I959 Hypotension, unspecified: Secondary | ICD-10-CM | POA: Diagnosis present

## 2012-10-11 DIAGNOSIS — I517 Cardiomegaly: Secondary | ICD-10-CM

## 2012-10-11 LAB — RAPID URINE DRUG SCREEN, HOSP PERFORMED
Amphetamines: NOT DETECTED
Benzodiazepines: NOT DETECTED
Opiates: POSITIVE — AB

## 2012-10-11 LAB — URINALYSIS, ROUTINE W REFLEX MICROSCOPIC
Hgb urine dipstick: NEGATIVE
Ketones, ur: NEGATIVE mg/dL
Protein, ur: NEGATIVE mg/dL
Urobilinogen, UA: 0.2 mg/dL (ref 0.0–1.0)

## 2012-10-11 LAB — COMPREHENSIVE METABOLIC PANEL
ALT: 43 U/L (ref 0–53)
AST: 60 U/L — ABNORMAL HIGH (ref 0–37)
Alkaline Phosphatase: 110 U/L (ref 39–117)
CO2: 27 mEq/L (ref 19–32)
Chloride: 104 mEq/L (ref 96–112)
GFR calc Af Amer: 61 mL/min — ABNORMAL LOW (ref >90)
GFR calc non Af Amer: 52 mL/min — ABNORMAL LOW (ref >90)
Glucose, Bld: 95 mg/dL (ref 70–99)
Potassium: 3.3 mEq/L — ABNORMAL LOW (ref 3.5–5.1)
Sodium: 139 mEq/L (ref 135–145)

## 2012-10-11 LAB — MRSA PCR SCREENING: MRSA by PCR: NEGATIVE

## 2012-10-11 LAB — CBC
HCT: 37.6 % — ABNORMAL LOW (ref 39.0–52.0)
MCH: 28.5 pg (ref 26.0–34.0)
MCHC: 34.3 g/dL (ref 30.0–36.0)
RDW: 13.9 % (ref 11.5–15.5)

## 2012-10-11 LAB — HEPARIN LEVEL (UNFRACTIONATED): Heparin Unfractionated: 0.19 IU/mL — ABNORMAL LOW (ref 0.30–0.70)

## 2012-10-11 MED ORDER — TECHNETIUM TO 99M ALBUMIN AGGREGATED
6.0000 | Freq: Once | INTRAVENOUS | Status: AC | PRN
  Administered 2012-10-11: 6 via INTRAVENOUS

## 2012-10-11 MED ORDER — SERTRALINE HCL 100 MG PO TABS
200.0000 mg | ORAL_TABLET | Freq: Every morning | ORAL | Status: DC
  Administered 2012-10-11 – 2012-10-13 (×3): 200 mg via ORAL
  Filled 2012-10-11 (×3): qty 2

## 2012-10-11 MED ORDER — ENOXAPARIN SODIUM 40 MG/0.4ML ~~LOC~~ SOLN
40.0000 mg | SUBCUTANEOUS | Status: DC
  Administered 2012-10-11 – 2012-10-12 (×2): 40 mg via SUBCUTANEOUS
  Filled 2012-10-11 (×3): qty 0.4

## 2012-10-11 MED ORDER — ONDANSETRON HCL 4 MG/2ML IJ SOLN: 4.0000 mg | Freq: Four times a day (QID) | INTRAMUSCULAR | Status: DC | PRN

## 2012-10-11 MED ORDER — QUETIAPINE FUMARATE 100 MG PO TABS
100.0000 mg | ORAL_TABLET | Freq: Every day | ORAL | Status: DC
  Administered 2012-10-11 – 2012-10-12 (×2): 100 mg via ORAL
  Filled 2012-10-11 (×3): qty 1

## 2012-10-11 MED ORDER — ACETAMINOPHEN 650 MG RE SUPP: 650.0000 mg | Freq: Four times a day (QID) | RECTAL | Status: DC | PRN

## 2012-10-11 MED ORDER — SODIUM CHLORIDE 0.9 % IJ SOLN
3.0000 mL | Freq: Two times a day (BID) | INTRAMUSCULAR | Status: DC
  Administered 2012-10-11 – 2012-10-12 (×5): 3 mL via INTRAVENOUS
  Administered 2012-10-13: 13:00:00 via INTRAVENOUS

## 2012-10-11 MED ORDER — HEPARIN BOLUS VIA INFUSION
4000.0000 [IU] | Freq: Once | INTRAVENOUS | Status: AC
  Administered 2012-10-11: 4000 [IU] via INTRAVENOUS

## 2012-10-11 MED ORDER — ACETAMINOPHEN 325 MG PO TABS
650.0000 mg | ORAL_TABLET | Freq: Four times a day (QID) | ORAL | Status: DC | PRN
  Administered 2012-10-11: 650 mg via ORAL
  Filled 2012-10-11: qty 2

## 2012-10-11 MED ORDER — INFLUENZA VAC SPLIT QUAD 0.5 ML IM SUSP
0.5000 mL | INTRAMUSCULAR | Status: AC
  Administered 2012-10-12: 0.5 mL via INTRAMUSCULAR
  Filled 2012-10-11: qty 0.5

## 2012-10-11 MED ORDER — TECHNETIUM TC 99M DIETHYLENETRIAME-PENTAACETIC ACID: 40.0000 | Freq: Once | INTRAVENOUS | Status: AC | PRN

## 2012-10-11 MED ORDER — MORPHINE SULFATE 2 MG/ML IJ SOLN: 1.0000 mg | INTRAMUSCULAR | Status: DC | PRN

## 2012-10-11 MED ORDER — HEPARIN (PORCINE) IN NACL 100-0.45 UNIT/ML-% IJ SOLN
1200.0000 [IU]/h | INTRAMUSCULAR | Status: DC
  Administered 2012-10-11: 1200 [IU]/h via INTRAVENOUS
  Filled 2012-10-11 (×2): qty 250

## 2012-10-11 MED ORDER — POTASSIUM CHLORIDE CRYS ER 20 MEQ PO TBCR
40.0000 meq | EXTENDED_RELEASE_TABLET | Freq: Once | ORAL | Status: AC
  Administered 2012-10-11: 40 meq via ORAL
  Filled 2012-10-11: qty 2

## 2012-10-11 MED ORDER — ONDANSETRON HCL 4 MG PO TABS: 4.0000 mg | ORAL_TABLET | Freq: Four times a day (QID) | ORAL | Status: DC | PRN

## 2012-10-11 MED ORDER — SODIUM CHLORIDE 0.9 % IV BOLUS (SEPSIS)
500.0000 mL | Freq: Once | INTRAVENOUS | Status: AC
  Administered 2012-10-11: 500 mL via INTRAVENOUS

## 2012-10-11 MED ORDER — OXYCODONE HCL 5 MG PO TABS
5.0000 mg | ORAL_TABLET | ORAL | Status: DC | PRN
  Administered 2012-10-11: 5 mg via ORAL
  Filled 2012-10-11: qty 1

## 2012-10-11 MED ORDER — SODIUM CHLORIDE 0.9 % IV SOLN
INTRAVENOUS | Status: AC
  Administered 2012-10-11 (×2): via INTRAVENOUS

## 2012-10-11 MED ORDER — SIMVASTATIN 10 MG PO TABS
10.0000 mg | ORAL_TABLET | Freq: Every day | ORAL | Status: DC
  Administered 2012-10-11 – 2012-10-12 (×2): 10 mg via ORAL
  Filled 2012-10-11 (×3): qty 1

## 2012-10-11 MED ORDER — GABAPENTIN 300 MG PO CAPS
600.0000 mg | ORAL_CAPSULE | Freq: Three times a day (TID) | ORAL | Status: DC
  Administered 2012-10-11 – 2012-10-13 (×8): 600 mg via ORAL
  Filled 2012-10-11 (×9): qty 2

## 2012-10-11 MED ORDER — SODIUM CHLORIDE 0.9 % IV SOLN
INTRAVENOUS | Status: DC
  Administered 2012-10-11: 100 mL/h via INTRAVENOUS
  Administered 2012-10-12: 20 mL/h via INTRAVENOUS
  Administered 2012-10-12: 05:00:00 via INTRAVENOUS

## 2012-10-11 MED ORDER — MORPHINE SULFATE 4 MG/ML IJ SOLN
4.0000 mg | Freq: Once | INTRAMUSCULAR | Status: AC
  Administered 2012-10-11: 4 mg via INTRAVENOUS
  Filled 2012-10-11: qty 1

## 2012-10-11 MED ORDER — ASPIRIN 81 MG PO CHEW
81.0000 mg | CHEWABLE_TABLET | Freq: Every morning | ORAL | Status: DC
  Administered 2012-10-11 – 2012-10-13 (×3): 81 mg via ORAL
  Filled 2012-10-11 (×3): qty 1

## 2012-10-11 NOTE — Progress Notes (Addendum)
ANTICOAGULATION CONSULT NOTE - Follow Up Consult  Pharmacy Consult for heparin Indication: chest pain/ACS. R/o VTE  Allergies  Allergen Reactions  . Bee Venom Anaphylaxis and Swelling    Patient Measurements: Height: 6\' 1"  (185.4 cm) Weight: 193 lb 9 oz (87.8 kg) IBW/kg (Calculated) : 79.9   Vital Signs: Temp: 97.6 F (36.4 C) (09/17 0700) Temp src: Oral (09/17 0700) BP: 120/93 mmHg (09/17 1000) Pulse Rate: 67 (09/17 1000)  Labs:  Recent Labs  10/10/12 1845 10/10/12 2200 10/11/12 0104 10/11/12 0315 10/11/12 0845 10/11/12 1230  HGB 13.6  --   --  12.9*  --   --   HCT 41.2  --   --  37.6*  --   --   PLT 205  --   --  191  --   --   LABPROT  --   --  15.3*  --   --   --   INR  --   --  1.24  --   --   --   HEPARINUNFRC  --   --   --   --   --  0.19*  CREATININE 2.49*  --   --  1.48*  --   --   TROPONINI <0.30 <0.30  --  <0.30 <0.30  --     Estimated Creatinine Clearance: 65.2 ml/min (by C-G formula based on Cr of 1.48).   Medications:  Scheduled:  . aspirin  81 mg Oral q morning - 10a  . gabapentin  600 mg Oral TID  . [START ON 10/12/2012] influenza vac split quadrivalent PF  0.5 mL Intramuscular Tomorrow-1000  . QUEtiapine  100 mg Oral QHS  . sertraline  200 mg Oral q morning - 10a  . simvastatin  10 mg Oral q1800  . sodium chloride  3 mL Intravenous Q12H   Infusions:  . heparin 1,200 Units/hr (10/11/12 0300)    Assessment: 52 yo male here with syncope/CP and on heparin for r/o PE and possible ACS (CABG this year). VQ scan is negative and troponin is neg x3. The initial heparin level is below goal (HL=0.19) on 1200 units/hr. Discussed with Dr. Sharon Seller and likely plan for heparin discontinuation  Goal of Therapy:  Heparin level 0.3-0.7 units/ml Monitor platelets by anticoagulation protocol: Yes   Plan:  -No heparin changes as likely plans for heparin discontinuation  Harland German, Pharm D 10/11/2012 3:28 PM

## 2012-10-11 NOTE — Progress Notes (Addendum)
[facility] Progress Note [place] TEAM 1 - Stepdown/ICU TEAM   HOWIE RUFUS MWN:[id number] DOB: 14-Apr-1960 DOA: 10/10/2012 PCP: Default, Provider, MD  Admit HPI / Brief Narrative: 52 y.o. male with a past medical history of coronary artery disease, with a CABG done on August 1 of this year. He has a history of chronic back pain, liver cirrhosis and hepatitis C. Denied any history of diabetes. He had chest pain back in July and then had a cardiac catheterization, which revealed triple vessel disease, and he had to be transferred to the [facility] in [place] for bypass procedure. He was in his usual state of health when he got up. He felt a little lightheaded and dizzy. He took his medications. He went with his cousin outside and found himself to be stumbling to some extent and then he proceeded to have a syncopal episode. He became conscious and then again had another episode of syncope. He had total of 4 episodes of syncope. Had some left-sided chest pain, which was radiating to his left arm and shoulder. Had some shortness of breath without any leg swelling. Had some sweating but denied any palpitations. No nausea, vomiting. No diarrhea. Had been taking his medications regularly. He denied any complications after his bypass procedure.   Assessment/Plan:  Syncope CT head unrevealing - likely orthostatic in nature - VQ not c/w PE - follow w/ volume expansion   Hypotension BP was low early this AM - cont to hydrate and follow - felt to be due to volume depletion + BP meds   CAD s/p CABG Aug 2014 Troponin negative x3 - no c/o CP at this time   Acute renal failure baseline creatinine appears to be 0.9-1.0 - crt improving quickly w/ IVF - f/u in AM  RUQ abdom pain [place] unrevealing - follow clinically   Mild hypokalemia  replet e - check Mg   Cirrhosis of the liver - Hepatitis C  Code Status: FULL Family Communication: no family present at time of exam Disposition Plan: SDU    Consultants: none  Procedures: 9/17 - TTE - EF 55-60% - no WMA  Antibiotics: none  DVT prophylaxis: IV heparin >> lovenox  HPI/Subjective: Pt is sleepy, but feels better overall.  No current CP, sob, f/c, n/v, or abdom pain.  Denies dizziness, vertigo, or HA.   Objective: Blood pressure 89/55, pulse 77, temperature 98.3 F (36.8 C), temperature source Oral, resp. rate 11, height 6\' 1"  (1.854 m), weight 87.8 kg (193 lb 9 oz), SpO2 99.00%.  Intake/Output Summary (Last 24 hours) at 10/11/12 1624 Last data filed at 10/11/12 1500  Gross per 24 hour  Intake   1656 ml  Output    980 ml  Net    676 ml   Exam: General: No acute respiratory distress Lungs: Clear to auscultation bilaterally without wheezes or crackles Cardiovascular: Regular rate and rhythm without murmur gallop or rub  Abdomen: Nontender, nondistended, soft, bowel sounds positive, no rebound, no ascites, no appreciable mass Extremities: No significant cyanosis, clubbing, or edema bilateral lower extremities  Data Reviewed: Basic Metabolic Panel:  Recent Labs Lab 10/10/12 1845 10/11/12 0013 10/11/12 0315  NA 136  --  139  K 3.4*  --  3.3*  CL 100  --  104  CO2 28  --  27  GLUCOSE 94  --  95  BUN 18  --  16  CREATININE 2.49*  --  1.48*  CALCIUM 9.8  --  8.8  MG  --  1.6  --    Liver Function Tests:  Recent Labs Lab 10/10/12 1845 10/11/12 0315  AST 65* 60*  ALT 48 43  ALKPHOS 121* 110  BILITOT 0.4 0.4  PROT 7.1 6.4  ALBUMIN 3.2* 3.0*    Recent Labs Lab 10/10/12 2239  LIPASE 34   CBC:  Recent Labs Lab 10/10/12 1845 10/11/12 0315  WBC 10.6* 13.0*  NEUTROABS 4.6  --   HGB 13.6 12.9*  HCT 41.2 37.6*  MCV 85.3 83.0  PLT 205 191   Cardiac Enzymes:  Recent Labs Lab 10/10/12 1845 10/10/12 2200 10/11/12 0315 10/11/12 0845  TROPONINI <0.30 <0.30 <0.30 <0.30    Recent Results (from the past 240 hour(s))  MRSA PCR SCREENING     Status: None   Collection Time    10/11/12   2:32 AM      Result Value Range Status   MRSA by PCR NEGATIVE  NEGATIVE Final   Comment:            The GeneXpert MRSA Assay (FDA     approved for NASAL specimens     only), is one component of a     comprehensive MRSA colonization     surveillance program. It is not     intended to diagnose MRSA     infection nor to guide or     monitor treatment for     MRSA infections.     Studies:  Recent x-ray studies have been reviewed in detail by the Attending Physician  Scheduled Meds:  Scheduled Meds: . aspirin  81 mg Oral q morning - 10a  . enoxaparin (LOVENOX) injection  40 mg Subcutaneous Q24H  . gabapentin  600 mg Oral TID  . [START ON 10/12/2012] influenza vac split quadrivalent PF  0.5 mL Intramuscular Tomorrow-1000  . potassium chloride  40 mEq Oral Once  . QUEtiapine  100 mg Oral QHS  . sertraline  200 mg Oral q morning - 10a  . simvastatin  10 mg Oral q1800  . sodium chloride  3 mL Intravenous Q12H    Time spent on care of this patient: 35 mins   [facility]  [facility] Office  [phone] Pager - Text Page per Loretha Stapler as per below:  On-Call/Text Page:      [email or web address]      password [email or web address]  If 7PM-7AM, please contact night-coverage [email or web address] Password [email or web address] 10/11/2012, 4:24 PM   LOS: 1 day

## 2012-10-11 NOTE — Progress Notes (Signed)
Pt's BP declining within the past hour.  Repositioned patient and BP cuff with no increase in BP.  Last BP 80/54.  Took BP manually and got 85/60.  Paged on call NP.  500cc bolus ordered.  Pt with no complaints of dizziness.  Pt resting comfortably.  Will continue to monitor closely.  Dara Hoyer

## 2012-10-11 NOTE — H&P (Signed)
[facility] History and Physical  [name] ZOX:[id number] DOB: 04/06/1960 DOA: 10/10/2012   PCP: His physician in [facility] [place] [facility]: Cardiologist is also the [place] system  Chief Complaint: Passed out and chest pain  HPI: [name] is a 52 y.o. male with a past medical history of coronary artery disease, with a CABG done on July 31 or August 1 of this year. He has a history of chronic back pain, liver cirrhosis and hepatitis C. Denies any history of diabetes. He tells me that he had chest pain back in July and then had a cardiac catheterization, which revealed triple vessel disease, and he had to be transferred to the [facility] in [place] for bypass procedure. He was in his usual state of health today when he got up this morning. He felt a little lightheaded and dizzy. He took his medications. He went with his cousin outside and found himself to be stumbling to some extent and then he proceeded to have a syncopal episode. He became conscious and then again had another episode of syncope. He had total of 4 episodes of syncope. Had some left-sided chest pain, which is radiating to his left arm and shoulder. Had some shortness of breath without any leg swelling. Had some sweating but denies any palpitations. No nausea, vomiting. No diarrhea. He cannot think why he would be dehydrated. Has been taking his medications regularly. He denies any complications after his bypass procedure.  Home Medications: Prior to Admission medications   Medication Sig Start Date End Date Taking? Authorizing Provider  amLODipine (NORVASC) 10 MG tablet Take 5 mg by mouth daily.   Yes Historical Provider, MD  aspirin 81 MG chewable tablet Chew 81 mg by mouth every morning.    Yes Historical Provider, MD  cetirizine (ZYRTEC) 10 MG tablet Take 10 mg by mouth every morning.    Yes Historical Provider, MD  cholecalciferol (VITAMIN D) 1000 UNITS tablet Take 1,000 Units by mouth daily.   Yes Historical  Provider, MD  fish oil-omega-3 fatty acids 1000 MG capsule Take 3 g by mouth 2 (two) times daily.   Yes Historical Provider, MD  gabapentin (NEURONTIN) 300 MG capsule Take 600 mg by mouth 3 (three) times daily.   Yes Historical Provider, MD  hydrochlorothiazide (HYDRODIURIL) 25 MG tablet Take 25 mg by mouth daily.   Yes Historical Provider, MD  lisinopril (PRINIVIL,ZESTRIL) 40 MG tablet Take 40 mg by mouth daily.   Yes Historical Provider, MD  magnesium oxide (MAG-OX) 400 MG tablet Take 800 mg by mouth daily.   Yes Historical Provider, MD  pravastatin (PRAVACHOL) 20 MG tablet Take 20 mg by mouth daily.   Yes Historical Provider, MD  QUEtiapine (SEROQUEL) 200 MG tablet Take 100 mg by mouth at bedtime.   Yes Historical Provider, MD  sertraline (ZOLOFT) 100 MG tablet Take 200 mg by mouth every morning.   Yes Historical Provider, MD  traMADol (ULTRAM) 50 MG tablet Take 50 mg by mouth 2 (two) times daily as needed.   Yes Historical Provider, MD  traZODone (DESYREL) 150 MG tablet Take 300 mg by mouth at bedtime.   Yes Historical Provider, MD  Carboxymethylcell-Hypromellose (GENTEAL) 0.25-0.3 % GEL Apply 1 application to eye 2 (two) times daily. 0.3% one application to both eyes twice a day for ocular dryness    Historical Provider, MD    Allergies:  Allergies  Allergen Reactions  . Bee Venom Anaphylaxis and Swelling    Past Medical History: Past Medical  History  Diagnosis Date  . Hypertension   . Hepatitis C   . Cirrhosis of liver   . Depression   . Gallstones   . Chronic back pain   . Left leg paresthesias   . Vertigo   . Hypercholesteremia   . Chronic neck pain   . Headache(784.0)   . Complicated migraine 06/16/2011    Past Surgical History  Procedure Laterality Date  . Orthopedic surgery      "on my legs"  . Bypass graft      triple bypass surgery  . Coronary artery bypass graft      Social History:  reports that he quit smoking about 3 years ago. He does not have any  smokeless tobacco history on file. He reports that he does not drink alcohol or use illicit drugs.  Living Situation: He lives in [place]  with a sister Activity Level: Independent with daily activities   Family History:  Family History  Problem Relation Age of Onset  . Diabetes Brother   . Diabetes Sister   . Diabetes Father   . Diabetes Mother   . Hypertension Mother   . Hypertension Father   . Hypertension Sister   . Hypertension Brother      Review of Systems - History obtained from the patient General ROS: positive for  - fatigue Psychological ROS: negative Ophthalmic ROS: negative ENT ROS: negative Allergy and Immunology ROS: negative Hematological and Lymphatic ROS: negative Endocrine ROS: negative Respiratory ROS: as in hpi Cardiovascular ROS: as in hpi Gastrointestinal ROS: no abdominal pain, change in bowel habits, or black or bloody stools Genito-Urinary ROS: no dysuria, trouble voiding, or hematuria Musculoskeletal ROS: negative Neurological ROS: no TIA or stroke symptoms Dermatological ROS: negative  Physical Examination  Filed Vitals:   10/10/12 2057 10/10/12 2059 10/10/12 2102 10/10/12 2211  BP: 117/81 123/94  110/78  Pulse: 84 85 85 89  Temp:      TempSrc:      Resp:    20  Height:      Weight:      SpO2:   96% 96%    General appearance: alert, cooperative, appears stated age and no distress Head: Normocephalic, without obvious abnormality, atraumatic Eyes: conjunctivae/corneas clear. PERRL, EOM's intact. Throat: lips, mucosa, and tongue normal; teeth and gums normal Neck: no adenopathy, no carotid bruit, no JVD, supple, symmetrical, trachea midline and thyroid not enlarged, symmetric, no tenderness/mass/nodules Resp: clear to auscultation bilaterally Cardio: regular rate and rhythm, S1, S2 normal, no murmur, click, rub or gallop. Chest wall was tender to palpation. GI: soft, non-tender; bowel sounds normal; no masses,  no  organomegaly Extremities: extremities normal, atraumatic, no cyanosis or edema Pulses: 2+ and symmetric Skin: Skin color, texture, turgor normal. No rashes or lesions Lymph nodes: Cervical, supraclavicular, and axillary nodes normal. Neurologic: He is alert and oriented x3. No focal neurological deficits are present  Laboratory Data: Results for orders placed during the hospital encounter of 10/10/12 (from the past 48 hour(s))  CBC WITH DIFFERENTIAL     Status: Abnormal   Collection Time    10/10/12  6:45 PM      Result Value Range   WBC 10.6 (*) 4.0 - 10.5 K/uL   RBC 4.83  4.22 - 5.81 MIL/uL   Hemoglobin 13.6  13.0 - 17.0 g/dL   HCT 40.9  81.1 - 91.4 %   MCV 85.3  78.0 - 100.0 fL   MCH 28.2  26.0 - 34.0 pg  MCHC 33.0  30.0 - 36.0 g/dL   RDW 16.1  09.6 - 04.5 %   Platelets 205  150 - 400 K/uL   Neutrophils Relative % 44  43 - 77 %   Neutro Abs 4.6  1.7 - 7.7 K/uL   Lymphocytes Relative 42  12 - 46 %   Lymphs Abs 4.4 (*) 0.7 - 4.0 K/uL   Monocytes Relative 8  3 - 12 %   Monocytes Absolute 0.8  0.1 - 1.0 K/uL   Eosinophils Relative 7 (*) 0 - 5 %   Eosinophils Absolute 0.8 (*) 0.0 - 0.7 K/uL   Basophils Relative 0  0 - 1 %   Basophils Absolute 0.0  0.0 - 0.1 K/uL  BASIC METABOLIC PANEL     Status: Abnormal   Collection Time    10/10/12  6:45 PM      Result Value Range   Sodium 136  135 - 145 mEq/L   Potassium 3.4 (*) 3.5 - 5.1 mEq/L   Chloride 100  96 - 112 mEq/L   CO2 28  19 - 32 mEq/L   Glucose, Bld 94  70 - 99 mg/dL   BUN 18  6 - 23 mg/dL   Creatinine, Ser 4.09 (*) 0.50 - 1.35 mg/dL   Calcium 9.8  8.4 - 81.1 mg/dL   GFR calc non Af Amer 28 (*) >90 mL/min   GFR calc Af Amer 32 (*) >90 mL/min   Comment: (NOTE)     The eGFR has been calculated using the CKD EPI equation.     This calculation has not been validated in all clinical situations.     eGFR's persistently <90 mL/min signify possible Chronic Kidney     Disease.  TROPONIN I     Status: None   Collection Time     10/10/12  6:45 PM      Result Value Range   Troponin I <0.30  <0.30 ng/mL   Comment:            Due to the release kinetics of cTnI,     a negative result within the first hours     of the onset of symptoms does not rule out     myocardial infarction with certainty.     If myocardial infarction is still suspected,     repeat the test at appropriate intervals.  HEPATIC FUNCTION PANEL     Status: Abnormal   Collection Time    10/10/12  6:45 PM      Result Value Range   Total Protein 7.1  6.0 - 8.3 g/dL   Albumin 3.2 (*) 3.5 - 5.2 g/dL   AST 65 (*) 0 - 37 U/L   ALT 48  0 - 53 U/L   Alkaline Phosphatase 121 (*) 39 - 117 U/L   Total Bilirubin 0.4  0.3 - 1.2 mg/dL   Bilirubin, Direct 0.2  0.0 - 0.3 mg/dL   Indirect Bilirubin 0.2 (*) 0.3 - 0.9 mg/dL  TROPONIN I     Status: None   Collection Time    10/10/12 10:00 PM      Result Value Range   Troponin I <0.30  <0.30 ng/mL   Comment:            Due to the release kinetics of cTnI,     a negative result within the first hours     of the onset of symptoms does not rule out     myocardial  infarction with certainty.     If myocardial infarction is still suspected,     repeat the test at appropriate intervals.  D-DIMER, QUANTITATIVE     Status: Abnormal   Collection Time    10/10/12 10:39 PM      Result Value Range   D-Dimer, Quant 2.00 (*) 0.00 - 0.48 ug/mL-FEU   Comment:            AT THE INHOUSE ESTABLISHED CUTOFF     VALUE OF 0.48 ug/mL FEU,     THIS ASSAY HAS BEEN DOCUMENTED     IN THE LITERATURE TO HAVE     A SENSITIVITY AND NEGATIVE     PREDICTIVE VALUE OF AT LEAST     98 TO 99%.  THE TEST RESULT     SHOULD BE CORRELATED WITH     AN ASSESSMENT OF THE CLINICAL     PROBABILITY OF DVT / VTE.  LIPASE, BLOOD     Status: None   Collection Time    10/10/12 10:39 PM      Result Value Range   Lipase 34  11 - 59 U/L    Radiology Reports: Dg Chest Port 1 View  10/10/2012   *RADIOLOGY REPORT*  Clinical Data: Chest pain   PORTABLE CHEST - 1 VIEW  Comparison: Prior chest x-ray 06/20/2011  Findings: Stable mild cardiomegaly.  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  Mild pulmonary vascular congestion without overt edema.  No large pleural effusion.  No pneumothorax.  Degenerative changes in the left shoulder.  No acute osseous abnormality.  IMPRESSION: Stable cardiomegaly and pulmonary vascular congestion without overt edema.   Original Report Authenticated By: Malachy Moan, M.D.    Electrocardiogram: Sinus rhythm at 84 beats per minute. Normal axis. Prolonged QT is noted. Diffuse T-wave inversions noted. Similar to previous EKG with T-wave inversions appear to be more pronounced.  Problem List  Principal Problem:   Syncope Active Problems:   Chronic back pain   ARF (acute renal failure)   Hypotension   CAD (coronary artery disease)   S/P CABG (coronary artery bypass graft)   Assessment: This is a 52 year old, African American male, who presents after having multiple syncopal episodes today, and he also had left-sided chest pain. He mostly has stable. EKG findings, but some T wave inversions may be more pronounced. Due to his recent hospitalization, venous thromboembolism needs to be considered. His d-dimer is significantly elevated. He's had 2 negative troponin so ischemia is somewhat less likely. He could have had orthostatic hypotension. He was hypotensive when he came in to the hospital, which has improved with IV fluids. Chest pain was reproducible by palpation of his chest wall. Possibly due to recent sternotomy.  Plan: #1 syncope: He'll need to be admitted to stepdown. He'll need a VQ scan. We'll place him on heparin till the V/Q scan is completed. He'll be given IV fluids. Orthostatics will be checked. Echocardiogram will be done be due to his recent cardiac procedure even though one was done in May. He had a carotid Doppler in May so we will not repeat that test.    #2 history of coronary artery disease with recent CABG: Continue with aspirin. Hold his antihypertensives now due to hypotension. Hold his ACE inhibitor due to renal failure. Doesn't appear to be on beta blocker at this time.  #3 acute on chronic renal failure: Creatinine back in May, was 1.93. It's 2.49 tonight. We will give him  IV fluids, and recheck his renal function in the morning. He is urinating. We will check a UA.  #4 right upper quadrant pain with mildly abnormal LFTs: He does have liver cirrhosis. I will get ultrasound of his abdomen.  #5 hypotension: Etiology for this is unclear. Could be medication induced. This is improved with IV fluids. Continue to monitor. Hold his antihypertensive agents for tonight.   DVT Prophylaxis: He'll be on IV heparin for now to the V/Q scan is completed Code Status: Full code Family Communication: Discussed with the patient  Disposition Plan: Due to recent cardiac surgery he'll be transferred to [facility]. Attempts were made to send him to the [place] however no cardiology was available there. I have discussed the case with Dr. Norma Fredrickson with Cardiology. He does not feel that the patient's presentation was due to cardiac process. He, thinks it's unlikely that he will have any complications from his bypass procedure, more than one month after that procedure. Cardiology will be available for consultation if any of the testing comes back abnormal.   Further management decisions will depend on results of further testing and patient's response to treatment.  [facility]  [facility] Pager [phone]  If 7PM-7AM, please contact night-coverage [email or web address] Password [facility]  10/11/2012, 12:13 AM

## 2012-10-11 NOTE — Progress Notes (Signed)
ANTICOAGULATION CONSULT NOTE - Initial Consult  Pharmacy Consult for Heparin Indication: chest pain/ACS  Allergies  Allergen Reactions  . Bee Venom Anaphylaxis and Swelling    Patient Measurements: Height: 6\' 1"  (185.4 cm) Weight: 220 lb (99.791 kg) IBW/kg (Calculated) : 79.9 Heparin Dosing Weight: 99.8 kg  Vital Signs: Temp: 98 F (36.7 C) (09/16 1809) Temp src: Oral (09/16 1809) BP: 109/78 mmHg (09/17 0030) Pulse Rate: 84 (09/17 0030)  Labs:  Recent Labs  10/10/12 1845 10/10/12 2200  HGB 13.6  --   HCT 41.2  --   PLT 205  --   CREATININE 2.49*  --   TROPONINI <0.30 <0.30    Estimated Creatinine Clearance: 42.7 ml/min (by C-G formula based on Cr of 2.49).   Medical History: Past Medical History  Diagnosis Date  . Hypertension   . Hepatitis C   . Cirrhosis of liver   . Depression   . Gallstones   . Chronic back pain   . Left leg paresthesias   . Vertigo   . Hypercholesteremia   . Chronic neck pain   . Headache(784.0)   . Complicated migraine 06/16/2011    Medications:  Scheduled:   Assessment: Okay for Protocol Baseline coag labs pending. Home medication list reviewed. VQ scan pending.  Goal of Therapy:  Heparin level 0.3-0.7 units/ml Monitor platelets by anticoagulation protocol: Yes   Plan:  Give 4000 units bolus x 1 Start heparin infusion at 1200 units/hr Check anti-Xa level in 6-8 hours and daily while on heparin Continue to monitor H&H and platelets  Mady Gemma 10/11/2012,12:58 AM

## 2012-10-11 NOTE — Progress Notes (Signed)
  Echocardiogram 2D Echocardiogram has been performed.  Jahniya Duzan 10/11/2012, 9:30 AM

## 2012-10-12 ENCOUNTER — Inpatient Hospital Stay (HOSPITAL_COMMUNITY): Payer: Non-veteran care

## 2012-10-12 DIAGNOSIS — B1920 Unspecified viral hepatitis C without hepatic coma: Secondary | ICD-10-CM

## 2012-10-12 LAB — CBC
HCT: 35.6 % — ABNORMAL LOW (ref 39.0–52.0)
MCH: 28.4 pg (ref 26.0–34.0)
MCHC: 34.3 g/dL (ref 30.0–36.0)
MCV: 83 fL (ref 78.0–100.0)
Platelets: 157 10*3/uL (ref 150–400)
RDW: 14 % (ref 11.5–15.5)
WBC: 6.9 10*3/uL (ref 4.0–10.5)

## 2012-10-12 LAB — BASIC METABOLIC PANEL
BUN: 13 mg/dL (ref 6–23)
CO2: 27 mEq/L (ref 19–32)
Calcium: 8.1 mg/dL — ABNORMAL LOW (ref 8.4–10.5)
Chloride: 106 mEq/L (ref 96–112)
Creatinine, Ser: 0.84 mg/dL (ref 0.50–1.35)

## 2012-10-12 MED ORDER — MAGNESIUM OXIDE 400 MG PO TABS: 800.0000 mg | ORAL_TABLET | Freq: Every day | ORAL | Status: DC

## 2012-10-12 MED ORDER — POTASSIUM CHLORIDE CRYS ER 20 MEQ PO TBCR
40.0000 meq | EXTENDED_RELEASE_TABLET | Freq: Once | ORAL | Status: AC
  Administered 2012-10-12: 40 meq via ORAL
  Filled 2012-10-12: qty 2

## 2012-10-12 MED ORDER — MAGNESIUM OXIDE 400 (241.3 MG) MG PO TABS
800.0000 mg | ORAL_TABLET | Freq: Every day | ORAL | Status: DC
  Administered 2012-10-12 – 2012-10-13 (×2): 800 mg via ORAL
  Filled 2012-10-12 (×2): qty 2

## 2012-10-12 MED ORDER — BISACODYL 5 MG PO TBEC
10.0000 mg | DELAYED_RELEASE_TABLET | Freq: Once | ORAL | Status: AC
  Administered 2012-10-12: 10 mg via ORAL
  Filled 2012-10-12: qty 2

## 2012-10-12 MED ORDER — BISACODYL 5 MG PO TBEC
10.0000 mg | DELAYED_RELEASE_TABLET | ORAL | Status: DC
  Filled 2012-10-12: qty 2

## 2012-10-12 MED ORDER — LEVOFLOXACIN IN D5W 500 MG/100ML IV SOLN
500.0000 mg | INTRAVENOUS | Status: DC
  Administered 2012-10-12: 500 mg via INTRAVENOUS
  Filled 2012-10-12 (×2): qty 100

## 2012-10-12 NOTE — Progress Notes (Signed)
[facility] Progress Note [place] TEAM 1 - Stepdown/ICU TEAM   [name] GNF:[id number] DOB: [birth year] DOA: 10/10/2012 PCP: Default, Provider, MD  Admit HPI / Brief Narrative: 52 y.o. male with a past medical history of coronary artery disease, with a CABG done on August 1 of this year. He has a history of chronic back pain, liver cirrhosis and hepatitis C. Denied any history of diabetes. He had chest pain back in July and then had a cardiac catheterization, which revealed triple vessel disease, and he had to be transferred to the [facility] in [place] for bypass procedure. He was in his usual state of health when he got up. He felt a little lightheaded and dizzy. He took his medications. He went with his cousin outside and found himself to be stumbling to some extent and then he proceeded to have a syncopal episode. He became conscious and then again had another episode of syncope. He had total of 4 episodes of syncope. Had some left-sided chest pain, which was radiating to his left arm and shoulder. Had some shortness of breath without any leg swelling. Had some sweating but denied any palpitations. No nausea, vomiting. No diarrhea. Had been taking his medications regularly. He denied any complications after his bypass procedure.   Assessment/Plan:  Syncope CT head unrevealing - likely orthostatic in nature - VQ not c/w PE - follow w/ volume expansion   Hypotension BP was low early this AM - cont to hydrate and follow - felt to be due to volume depletion + BP meds --Cortisol was 5.1 and BP has recovered OFF steroids so no additional work up indicated  Acute respiratory failure with hypoxia Pt had cough x 1 week prior to admit and now has focal crackles on exam- check CXR- tx as CAP so begin Levaquin (Qtc 491)  CAD s/p CABG Aug 2014 Troponin negative x3 - primarily c/o pleuritic CP sx's  Acute renal failure baseline creatinine appears to be 0.9-1.0 - crt improving quickly  w/ IVF - f/u in AM  RUQ abdom pain [place] unrevealing - follow clinically -?? Constipation  KUB reveals mod to large stool burden- start laxatives  Mild hypokalemia  replet e - Mg 1.6  Cirrhosis of the liver - Hepatitis C  Code Status: FULL Family Communication: no family present at time of exam Disposition Plan: Transfer to floor  Consultants: none  Procedures: 9/17 - TTE - EF 55-60% - no WMA  Antibiotics: none  DVT prophylaxis: IV heparin >> lovenox  HPI/Subjective: Pt is endorsing productive thick white cough, pleuritic CP but no SOB or dizziness.  Objective: Blood pressure 153/109, pulse 72, temperature 98 F (36.7 C), temperature source Oral, resp. rate 9, height 6\' 1"  (1.854 m), weight 87.8 kg (193 lb 9 oz), SpO2 98.00%.  Intake/Output Summary (Last 24 hours) at 10/12/12 1310 Last data filed at 10/12/12 0800  Gross per 24 hour  Intake 3122.67 ml  Output   1950 ml  Net 1172.67 ml   Exam: General: No acute respiratory distress Lungs: Right mid lung crackles, 2L Cardiovascular: Regular rate and rhythm without murmur gallop or rub  Abdomen: Nontender, nondistended, soft, bowel sounds positive, no rebound, no ascites, no appreciable mass Extremities: No significant cyanosis, clubbing, or edema bilateral lower extremities  Data Reviewed: Basic Metabolic Panel:  Recent Labs Lab 10/10/12 1845 10/11/12 0013 10/11/12 0315 10/12/12 0332  NA 136  --  139 140  K 3.4*  --  3.3* 3.4*  CL 100  --  104 106  CO2 28  --  27 27  GLUCOSE 94  --  95 86  BUN 18  --  16 13  CREATININE 2.49*  --  1.48* 0.84  CALCIUM 9.8  --  8.8 8.1*  MG  --  1.6  --  1.6   Liver Function Tests:  Recent Labs Lab 10/10/12 1845 10/11/12 0315  AST 65* 60*  ALT 48 43  ALKPHOS 121* 110  BILITOT 0.4 0.4  PROT 7.1 6.4  ALBUMIN 3.2* 3.0*    Recent Labs Lab 10/10/12 2239  LIPASE 34   CBC:  Recent Labs Lab 10/10/12 1845 10/11/12 0315 10/12/12 0332  WBC 10.6* 13.0* 6.9   NEUTROABS 4.6  --   --   HGB 13.6 12.9* 12.2*  HCT 41.2 37.6* 35.6*  MCV 85.3 83.0 83.0  PLT 205 191 157   Cardiac Enzymes:  Recent Labs Lab 10/10/12 1845 10/10/12 2200 10/11/12 0315 10/11/12 0845 10/11/12 1509  TROPONINI <0.30 <0.30 <0.30 <0.30 <0.30    Recent Results (from the past 240 hour(s))  MRSA PCR SCREENING     Status: None   Collection Time    10/11/12  2:32 AM      Result Value Range Status   MRSA by PCR NEGATIVE  NEGATIVE Final   Comment:            The GeneXpert MRSA Assay (FDA     approved for NASAL specimens     only), is one component of a     comprehensive MRSA colonization     surveillance program. It is not     intended to diagnose MRSA     infection nor to guide or     monitor treatment for     MRSA infections.     Studies:  Recent x-ray studies have been reviewed in detail by the Attending Physician  Scheduled Meds:  Scheduled Meds: . aspirin  81 mg Oral q morning - 10a  . enoxaparin (LOVENOX) injection  40 mg Subcutaneous Q24H  . gabapentin  600 mg Oral TID  . magnesium oxide  800 mg Oral Daily  . QUEtiapine  100 mg Oral QHS  . sertraline  200 mg Oral q morning - 10a  . simvastatin  10 mg Oral q1800  . sodium chloride  3 mL Intravenous Q12H    Time spent on care of this patient: 35 mins   ELLIS,ALLISON L. ANP  [facility] Office  [phone] Pager - Text Page per Loretha Stapler as per below:  On-Call/Text Page:      [email or web address]      password [email or web address]  If 7PM-7AM, please contact night-coverage [email or web address] Password [email or web address] 10/12/2012, 1:10 PM   LOS: 2 days     I have examined the patient, reviewed the chart and modified the above note which I agree with.   Dezarae Mcclaran,MD [phone] 10/12/2012, 5:03 PM  I have examined the patient, reviewed the chart and modified the above note which I agree with.   Rumaisa Schnetzer,MD [phone] 10/12/2012, 5:01 PM

## 2012-10-13 LAB — BASIC METABOLIC PANEL
BUN: 10 mg/dL (ref 6–23)
Chloride: 109 mEq/L (ref 96–112)
GFR calc Af Amer: >90 mL/min (ref >90)
Glucose, Bld: 85 mg/dL (ref 70–99)
Potassium: 3.8 mEq/L (ref 3.5–5.1)

## 2012-10-13 LAB — CBC
HCT: 37.3 % — ABNORMAL LOW (ref 39.0–52.0)
Hemoglobin: 12.9 g/dL — ABNORMAL LOW (ref 13.0–17.0)
RDW: 13.8 % (ref 11.5–15.5)
WBC: 7.4 10*3/uL (ref 4.0–10.5)

## 2012-10-13 MED ORDER — DSS 100 MG PO CAPS: 100.0000 mg | ORAL_CAPSULE | Freq: Two times a day (BID) | ORAL | Status: DC

## 2012-10-13 MED ORDER — POLYETHYLENE GLYCOL 3350 17 G PO PACK: 17.0000 g | PACK | Freq: Every day | ORAL | Status: DC

## 2012-10-13 MED ORDER — MAGNESIUM HYDROXIDE 400 MG/5ML PO SUSP: 15.0000 mL | Freq: Every day | ORAL | Status: DC | PRN

## 2012-10-13 MED ORDER — POLYETHYLENE GLYCOL 3350 17 G PO PACK
17.0000 g | PACK | Freq: Every day | ORAL | Status: DC
  Filled 2012-10-13: qty 1

## 2012-10-13 MED ORDER — LISINOPRIL 20 MG PO TABS
20.0000 mg | ORAL_TABLET | Freq: Every day | ORAL | Status: DC
  Administered 2012-10-13: 20 mg via ORAL
  Filled 2012-10-13: qty 1

## 2012-10-13 MED ORDER — LEVOFLOXACIN 500 MG PO TABS: 500.0000 mg | ORAL_TABLET | Freq: Every day | ORAL | Status: DC

## 2012-10-13 MED ORDER — DOCUSATE SODIUM 100 MG PO CAPS
100.0000 mg | ORAL_CAPSULE | Freq: Two times a day (BID) | ORAL | Status: DC
  Filled 2012-10-13 (×2): qty 1

## 2012-10-13 MED ORDER — AMLODIPINE BESYLATE 5 MG PO TABS
5.0000 mg | ORAL_TABLET | Freq: Every day | ORAL | Status: DC
  Administered 2012-10-13: 5 mg via ORAL
  Filled 2012-10-13: qty 1

## 2012-10-13 NOTE — Discharge Summary (Signed)
Physician Discharge Summary  [name] ZOX:[id number] DOB: 1960-05-20 DOA: 10/10/2012  PCP: Default, Provider, MD  Admit date: 10/10/2012 Discharge date: 10/13/2012  Time spent: >30 minutes  Recommendations for Outpatient Follow-up:  1. Follow up with your heart surgeon at the [facility] as previously scheduled 2. Follow up with your PCP in 1-2 weeks for routine hospital follow up  Discharge Diagnoses:    Syncope due to orthostatic hypotension in setting of dehydration and anti hypertensive medications   Chronic back pain   ARF (acute renal failure) - resolved   Hypotension-resolved   CAD (coronary artery disease)   S/P CABG (coronary artery bypass graft)   Discharge Condition: stable  Diet recommendation: heart healthy  Filed Weights   10/10/12 1809 10/11/12 0235  Weight: 99.791 kg (220 lb) 87.8 kg (193 lb 9 oz)    History of present illness:  52 y.o. male with a past medical history of coronary artery disease, with a CABG done on August 1 of this year. He has a history of chronic back pain, liver cirrhosis and hepatitis C. Denied any history of diabetes. He had chest pain in July with subsequent cardiac catheterization that revealed triple vessel disease. He was then transferred to the [facility] in [place] for bypass procedure. He was in his usual state of health until he acutely developed lightheadedness and dizziness with standing. He had taken his medications as prescribed. He went with his cousin outside and noticed he was stumbling to some extent and then he proceeded to have a syncopal episode. After becoming conscious he had another episode of syncope. He had total of 4 episodes of syncope before presenting to the hospital. He also had some left-sided chest pain, which was radiating to his left arm and shoulder. Had some shortness of breath. Denied any leg swelling. Had some sweating but denied any palpitations. No nausea, vomiting. No diarrhea.   Hospital Course:   Syncope   CT head was unrevealing - likely orthostatic in nature - VQ not c/w PE - sx resolved after volume expansion  Hypotension  BP was low early in the hospitalization but improved after restoration of volume and with holding his anti hypertensive medications. Cortisol was 5.1 and BP recovered without any steroids so no additional work up indicated re: adrenal insuffuciency  Acute respiratory failure with hypoxia due to acute bronchitis Pt had cough x 1 week prior to admit and was noted with focal crackles on exam - CXR revealed bronchitic changes so Levaquin was initiated and will continue for a total of 7 days therapy.  CAD s/p CABG Aug 2014  Troponin negative x3 - primarily c/o pleuritic CP sx's   Acute renal failure  baseline creatinine  0.9-1.0 - crt improved quickly w/ IVF and was 0.83 prior to dc  RUQ abdom pain due to constipation [place] unrevealing but KUB revealed mod to large stool burden - started laxatives and stool softeners and will continue upon dc  HTN On day of dc BP up so some home meds resumed. Given recent orthostasis and constipation will defer resumption of HCTZ to PCP  Mild hypokalemia  repleted - Mg 1.6   Cirrhosis of the liver - Hepatitis C   Procedures: Transthoracic Echocardiography - Left ventricle: Technically limited study. The cavity size was normal. Wall thickness was increased in a pattern of moderate LVH. Systolic function was normal. The estimated ejection fraction was in the range of 55% to 60%. Wall motion was normal; there were no regional wall motion  abnormalities. - Left atrium: The atrium was mildly dilated. - Right ventricle: The cavity size was normal. Systolic function was moderately reduced. There is significant RV dysfunction. - Pulmonic valve: No significant regurgitation. RV pressure can not be estimated. - Impressions: Suggestion of pleural effusion.   Consultations:  None  Discharge Exam: Filed Vitals:   10/13/12 1300  BP:  159/99  Pulse:   Temp: 98.1 F (36.7 C)  Resp: 16   General: No acute respiratory distress  Lungs: Right mid lung crackles have markedly decreased, RA Cardiovascular: Regular rate and rhythm without murmur gallop or rub  Abdomen: Nontender, nondistended, soft, bowel sounds positive, no rebound, no ascites, no appreciable mass  Extremities: No significant cyanosis, clubbing, or edema bilateral lower extremities  Discharge Instructions      Discharge Orders   Future Orders Complete By Expires   Call MD for:  difficulty breathing, headache or visual disturbances  As directed    Call MD for:  extreme fatigue  As directed    Call MD for:  persistant dizziness or light-headedness  As directed    Call MD for:  persistant nausea and vomiting  As directed    Call MD for:  temperature >100.4  As directed    Diet - low sodium heart healthy  As directed    Increase activity slowly  As directed        Medication List    STOP taking these medications       hydrochlorothiazide 25 MG tablet  Commonly known as:  HYDRODIURIL      TAKE these medications       amLODipine 10 MG tablet  Commonly known as:  NORVASC  Take 5 mg by mouth daily.     aspirin 81 MG chewable tablet  Chew 81 mg by mouth every morning.     cetirizine 10 MG tablet  Commonly known as:  ZYRTEC  Take 10 mg by mouth every morning.     cholecalciferol 1000 UNITS tablet  Commonly known as:  VITAMIN D  Take 1,000 Units by mouth daily.     DSS 100 MG Caps  Take 100 mg by mouth 2 (two) times daily.     fish oil-omega-3 fatty acids 1000 MG capsule  Take 3 g by mouth 2 (two) times daily.     gabapentin 300 MG capsule  Commonly known as:  NEURONTIN  Take 600 mg by mouth 3 (three) times daily.     GENTEAL 0.25-0.3 % Gel  Generic drug:  Carboxymethylcell-Hypromellose  Apply 1 application to eye 2 (two) times daily. 0.3% one application to both eyes twice a day for ocular dryness     levofloxacin 500 MG tablet   Commonly known as:  LEVAQUIN  Take 1 tablet (500 mg total) by mouth daily.     lisinopril 40 MG tablet  Commonly known as:  PRINIVIL,ZESTRIL  Take 40 mg by mouth daily.     magnesium oxide 400 MG tablet  Commonly known as:  MAG-OX  Take 800 mg by mouth daily.     polyethylene glycol packet  Commonly known as:  MIRALAX / GLYCOLAX  Take 17 g by mouth daily.     pravastatin 20 MG tablet  Commonly known as:  PRAVACHOL  Take 20 mg by mouth daily.     QUEtiapine 200 MG tablet  Commonly known as:  SEROQUEL  Take 100 mg by mouth at bedtime.     sertraline 100 MG tablet  Commonly known as:  ZOLOFT  Take 200 mg by mouth every morning.     traMADol 50 MG tablet  Commonly known as:  ULTRAM  Take 50 mg by mouth 2 (two) times daily as needed.     traZODone 150 MG tablet  Commonly known as:  DESYREL  Take 300 mg by mouth at bedtime.       Allergies  Allergen Reactions  . Bee Venom Anaphylaxis and Swelling   Follow-up Information   Follow up with Default, Provider, MD[facility])    Contact information:   [address] [place] [address] [phone]       Please follow up. (Follow up with your heart surgeon at the [facility] as previously scheduled)       The results of significant diagnostics from this hospitalization (including imaging, microbiology, ancillary and laboratory) are listed below for reference.    Significant Diagnostic Studies: Dg Chest 2 View  10/12/2012   *RADIOLOGY REPORT*  Clinical Data: Generalized chest pain, cough, shortness of breath, right-sided crackles on physical examination, former smoker, history of asthma, i subsequent encounter.  CHEST - 2 VIEW  Comparison: 10/10/2012; 06/20/2011  Findings: Grossly unchanged borderline enlarged cardiac silhouette and mediastinal contours post median sternotomy and CABG.  The lungs appear mildly hyperexpanded with flattening of the hemidiaphragms and slightly  nodular thickening of the pulmonary interstitium.  Improved aeration of the lung bases with persistent minimal bilateral infrahilar opacities favored to represent atelectasis.  No new focal airspace opacities.  No pleural effusion or pneumothorax.  No evidence of edema.  Unchanged bones.  IMPRESSION: Mild lung hyperexpansion and bronchitic change without acute cardiopulmonary disease.   Original Report Authenticated By: Tacey Ruiz, MD   Dg Abd 1 View  10/12/2012   *RADIOLOGY REPORT*  Clinical Data: Right upper quadrant abdominal pain, initial encounter.  ABDOMEN - 1 VIEW  Comparison: CT abdomen pelvis - 08/23/2003  Findings:  Moderate to large colonic stool burden without evidence of obstruction.  Nondiagnostic evaluation pneumoperitoneum secondary supine positioning and exclusion of the lower thorax.  No definite pneumatosis or portal venous gas.  No definite abnormal intra-abdominal calcifications.  Multilevel lumbar spine degenerative change is suspected.  IMPRESSION: Moderate to large colonic stool burden without evidence of obstruction.   Original Report Authenticated By: Tacey Ruiz, MD   Ct Head Wo Contrast  10/11/2012   CLINICAL DATA:  Syncope.  EXAM: CT HEAD WITHOUT CONTRAST  TECHNIQUE: Contiguous axial images were obtained from the base of the skull through the vertex without intravenous contrast.  COMPARISON:  Head CT 06/20/2011.  FINDINGS: No acute intracranial abnormalities. Specifically, no evidence of acute intracranial hemorrhage, no definite findings of acute/subacute cerebral ischemia, no mass, mass effect, hydrocephalus or abnormal intra or extra-axial fluid collections. Visualized paranasal sinuses and mastoids are well pneumatized. No acute displaced skull fractures are identified.  IMPRESSION: * No acute intracranial abnormalities. *The appearance of the brain is normal.   Electronically Signed   By: Trudie Reed M.D.   On: 10/11/2012 00:47   US Abdomen Complete  10/11/2012    CLINICAL DATA:  Carotid quadrant pain. Abnormal liver function tests.  EXAM: ABDOMEN ULTRASOUND  COMPARISON:  CT 08/23/2003  FINDINGS: Gallbladder  Innumerable small gallstones are present. The largest is 8 mm. No wall thickening or Murphy's sign.  Common bile duct  Diameter: 6 mm in caliber.  Liver  The liver is diffusely heterogeneous without focal mass. The  border is nodular.  IVC  No abnormality visualized.  Pancreas  Obscured by overlying bowel gas. No obvious mass.  Spleen  Size and appearance within normal limits.  Right Kidney  Length: Normal in size. Echogenicity within normal limits. No mass or hydronephrosis visualized.  Left Kidney  Length: Normal in size. Echogenicity within normal limits. No mass or hydronephrosis visualized.  Abdominal aorta  No aneurysm visualized.  IMPRESSION: Limited visualization of the pancreas.  The liver is heterogeneous with a nodular border worrisome for cirrhotic change. Diffuse hepatic parenchymal disease is suggested.  Cholelithiasis.  Common bile duct is upper normal in caliber.   Electronically Signed   By: Maryclare Bean M.D.   On: 10/11/2012 14:11   Nm Pulmonary Perf And Vent  10/11/2012   CLINICAL DATA:  Shortness of breath.  EXAM: NUCLEAR MEDICINE VENTILATION - PERFUSION LUNG SCAN  TECHNIQUE: Ventilation images were obtained in multiple projections using inhaled aerosol technetium 99 M DTPA. Perfusion images were obtained in multiple projections after intravenous injection of Tc-28m MAA.  COMPARISON:  CHEST x-ray 10/10/2012  RADIOPHARMACEUTICALS:  Forty mCi Tc-62m DTPA aerosol and 6 mCi Tc-76m MAA  FINDINGS: Ventilation: No focal ventilation defect.  Perfusion: No wedge shaped peripheral perfusion defects to suggest acute pulmonary embolism.  IMPRESSION: No evidence of pulmonary embolus.   Electronically Signed   By: Charlett Nose M.D.   On: 10/11/2012 13:41   Dg Chest Port 1 View  10/10/2012   *RADIOLOGY REPORT*  Clinical Data: Chest pain  PORTABLE CHEST - 1 VIEW   Comparison: Prior chest x-ray 06/20/2011  Findings: Stable mild cardiomegaly.  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  Mild pulmonary vascular congestion without overt edema.  No large pleural effusion.  No pneumothorax.  Degenerative changes in the left shoulder.  No acute osseous abnormality.  IMPRESSION: Stable cardiomegaly and pulmonary vascular congestion without overt edema.   Original Report Authenticated By: Malachy Moan, M.D.    Microbiology: Recent Results (from the past 240 hour(s))  MRSA PCR SCREENING     Status: None   Collection Time    10/11/12  2:32 AM      Result Value Range Status   MRSA by PCR NEGATIVE  NEGATIVE Final   Comment:            The GeneXpert MRSA Assay (FDA     approved for NASAL specimens     only), is one component of a     comprehensive MRSA colonization     surveillance program. It is not     intended to diagnose MRSA     infection nor to guide or     monitor treatment for     MRSA infections.     Labs: Basic Metabolic Panel:  Recent Labs Lab 10/10/12 1845 10/11/12 0013 10/11/12 0315 10/12/12 0332 10/13/12 0525  NA 136  --  139 140 142  K 3.4*  --  3.3* 3.4* 3.8  CL 100  --  104 106 109  CO2 28  --  27 27 28   GLUCOSE 94  --  95 86 85  BUN 18  --  16 13 10   CREATININE 2.49*  --  1.48* 0.84 0.83  CALCIUM 9.8  --  8.8 8.1* 9.2  MG  --  1.6  --  1.6  --    Liver Function Tests:  Recent Labs Lab 10/10/12 1845 10/11/12 0315  AST 65* 60*  ALT 48 43  ALKPHOS  121* 110  BILITOT 0.4 0.4  PROT 7.1 6.4  ALBUMIN 3.2* 3.0*    Recent Labs Lab 10/10/12 2239  LIPASE 34   CBC:  Recent Labs Lab 10/10/12 1845 10/11/12 0315 10/12/12 0332 10/13/12 0525  WBC 10.6* 13.0* 6.9 7.4  NEUTROABS 4.6  --   --   --   HGB 13.6 12.9* 12.2* 12.9*  HCT 41.2 37.6* 35.6* 37.3*  MCV 85.3 83.0 83.0 82.9  PLT 205 191 157 145*   Cardiac Enzymes:  Recent Labs Lab 10/10/12 1845 10/10/12 2200  10/11/12 0315 10/11/12 0845 10/11/12 1509  TROPONINI <0.30 <0.30 <0.30 <0.30 <0.30   CBG:  Recent Labs Lab 10/13/12 0900  GLUCAP 102*   Signed:  ELLIS,ALLISON L. ANP  [facility] 10/13/2012, 2:12 PM  I have personally examined this patient and reviewed the entire database. I have reviewed the above note, made any necessary editorial changes, and agree with its content.  Lonia Blood, MD [facility]

## 2014-02-07 ENCOUNTER — Emergency Department (HOSPITAL_COMMUNITY): Payer: Non-veteran care

## 2014-02-07 ENCOUNTER — Emergency Department (HOSPITAL_COMMUNITY)
Admission: EM | Admit: 2014-02-07 | Discharge: 2014-02-07 | Disposition: A | Discharge status: Home / Self Care | Payer: Non-veteran care | Attending: Emergency Medicine | Admitting: Emergency Medicine

## 2014-02-07 ENCOUNTER — Encounter (HOSPITAL_COMMUNITY): Payer: Self-pay | Admitting: Emergency Medicine

## 2014-02-07 DIAGNOSIS — Z859 Personal history of malignant neoplasm, unspecified: Secondary | ICD-10-CM | POA: Insufficient documentation

## 2014-02-07 DIAGNOSIS — J209 Acute bronchitis, unspecified: Secondary | ICD-10-CM | POA: Insufficient documentation

## 2014-02-07 DIAGNOSIS — Z7952 Long term (current) use of systemic steroids: Secondary | ICD-10-CM | POA: Insufficient documentation

## 2014-02-07 DIAGNOSIS — I252 Old myocardial infarction: Secondary | ICD-10-CM | POA: Insufficient documentation

## 2014-02-07 DIAGNOSIS — G8929 Other chronic pain: Secondary | ICD-10-CM | POA: Insufficient documentation

## 2014-02-07 DIAGNOSIS — R079 Chest pain, unspecified: Secondary | ICD-10-CM | POA: Insufficient documentation

## 2014-02-07 DIAGNOSIS — Z8673 Personal history of transient ischemic attack (TIA), and cerebral infarction without residual deficits: Secondary | ICD-10-CM | POA: Insufficient documentation

## 2014-02-07 DIAGNOSIS — R519 Headache, unspecified: Secondary | ICD-10-CM

## 2014-02-07 DIAGNOSIS — Z87891 Personal history of nicotine dependence: Secondary | ICD-10-CM | POA: Insufficient documentation

## 2014-02-07 DIAGNOSIS — F329 Major depressive disorder, single episode, unspecified: Secondary | ICD-10-CM | POA: Insufficient documentation

## 2014-02-07 DIAGNOSIS — I10 Essential (primary) hypertension: Secondary | ICD-10-CM | POA: Insufficient documentation

## 2014-02-07 DIAGNOSIS — Z8719 Personal history of other diseases of the digestive system: Secondary | ICD-10-CM | POA: Insufficient documentation

## 2014-02-07 DIAGNOSIS — Z7982 Long term (current) use of aspirin: Secondary | ICD-10-CM | POA: Insufficient documentation

## 2014-02-07 DIAGNOSIS — Z792 Long term (current) use of antibiotics: Secondary | ICD-10-CM | POA: Insufficient documentation

## 2014-02-07 DIAGNOSIS — G43909 Migraine, unspecified, not intractable, without status migrainosus: Secondary | ICD-10-CM | POA: Insufficient documentation

## 2014-02-07 DIAGNOSIS — Z8619 Personal history of other infectious and parasitic diseases: Secondary | ICD-10-CM | POA: Insufficient documentation

## 2014-02-07 DIAGNOSIS — I2510 Atherosclerotic heart disease of native coronary artery without angina pectoris: Secondary | ICD-10-CM | POA: Insufficient documentation

## 2014-02-07 DIAGNOSIS — Z951 Presence of aortocoronary bypass graft: Secondary | ICD-10-CM | POA: Insufficient documentation

## 2014-02-07 DIAGNOSIS — Z79899 Other long term (current) drug therapy: Secondary | ICD-10-CM | POA: Insufficient documentation

## 2014-02-07 DIAGNOSIS — R51 Headache: Secondary | ICD-10-CM

## 2014-02-07 DIAGNOSIS — Z791 Long term (current) use of non-steroidal anti-inflammatories (NSAID): Secondary | ICD-10-CM | POA: Insufficient documentation

## 2014-02-07 LAB — CBC WITH DIFFERENTIAL/PLATELET
Basophils Absolute: 0 10*3/uL (ref 0.0–0.1)
Basophils Relative: 0 % (ref 0–1)
EOS ABS: 0.1 10*3/uL (ref 0.0–0.7)
EOS PCT: 1 % (ref 0–5)
HCT: 40.6 % (ref 39.0–52.0)
Hemoglobin: 13.7 g/dL (ref 13.0–17.0)
LYMPHS ABS: 2.1 10*3/uL (ref 0.7–4.0)
LYMPHS PCT: 20 % (ref 12–46)
MCH: 29.1 pg (ref 26.0–34.0)
MCHC: 33.7 g/dL (ref 30.0–36.0)
MCV: 86.2 fL (ref 78.0–100.0)
Monocytes Absolute: 1.4 10*3/uL — ABNORMAL HIGH (ref 0.1–1.0)
Monocytes Relative: 13 % — ABNORMAL HIGH (ref 3–12)
Neutro Abs: 7.1 10*3/uL (ref 1.7–7.7)
Neutrophils Relative %: 66 % (ref 43–77)
Platelets: 121 10*3/uL — ABNORMAL LOW (ref 150–400)
RBC: 4.71 MIL/uL (ref 4.22–5.81)
RDW: 13.3 % (ref 11.5–15.5)
WBC: 10.7 10*3/uL — AB (ref 4.0–10.5)

## 2014-02-07 LAB — I-STAT CHEM 8, ED
BUN: 8 mg/dL (ref 6–23)
CHLORIDE: 99 meq/L (ref 96–112)
CREATININE: 0.8 mg/dL (ref 0.50–1.35)
Calcium, Ion: 1.14 mmol/L (ref 1.12–1.23)
Glucose, Bld: 247 mg/dL — ABNORMAL HIGH (ref 70–99)
HCT: 47 % (ref 39.0–52.0)
HEMOGLOBIN: 16 g/dL (ref 13.0–17.0)
Potassium: 3.6 mmol/L (ref 3.5–5.1)
Sodium: 135 mmol/L (ref 135–145)
TCO2: 20 mmol/L (ref 0–100)

## 2014-02-07 LAB — I-STAT TROPONIN, ED: TROPONIN I, POC: 0 ng/mL (ref 0.00–0.08)

## 2014-02-07 MED ORDER — IPRATROPIUM-ALBUTEROL 0.5-2.5 (3) MG/3ML IN SOLN
3.0000 mL | Freq: Once | RESPIRATORY_TRACT | Status: AC
  Administered 2014-02-07: 3 mL via RESPIRATORY_TRACT
  Filled 2014-02-07: qty 3

## 2014-02-07 MED ORDER — IBUPROFEN 800 MG PO TABS: 800.0000 mg | ORAL_TABLET | Freq: Three times a day (TID) | ORAL | Status: DC | PRN

## 2014-02-07 MED ORDER — ALBUTEROL SULFATE (2.5 MG/3ML) 0.083% IN NEBU: 5.0000 mg | INHALATION_SOLUTION | Freq: Once | RESPIRATORY_TRACT | Status: DC

## 2014-02-07 MED ORDER — NAPROXEN 250 MG PO TABS
500.0000 mg | ORAL_TABLET | Freq: Once | ORAL | Status: AC
  Administered 2014-02-07: 500 mg via ORAL
  Filled 2014-02-07: qty 2

## 2014-02-07 MED ORDER — AZITHROMYCIN 250 MG PO TABS: ORAL_TABLET | ORAL | Status: DC

## 2014-02-07 MED ORDER — CYCLOBENZAPRINE HCL 10 MG PO TABS
10.0000 mg | ORAL_TABLET | Freq: Once | ORAL | Status: AC
  Administered 2014-02-07: 10 mg via ORAL
  Filled 2014-02-07: qty 1

## 2014-02-07 MED ORDER — IPRATROPIUM BROMIDE 0.02 % IN SOLN: 0.5000 mg | Freq: Once | RESPIRATORY_TRACT | Status: DC

## 2014-02-07 MED ORDER — ALBUTEROL SULFATE (2.5 MG/3ML) 0.083% IN NEBU
2.5000 mg | INHALATION_SOLUTION | Freq: Once | RESPIRATORY_TRACT | Status: AC
  Administered 2014-02-07: 2.5 mg via RESPIRATORY_TRACT
  Filled 2014-02-07: qty 3

## 2014-02-07 NOTE — ED Provider Notes (Signed)
CSN: [id number]     Arrival date & time 02/07/14  0554 History   First MD Initiated Contact with Patient 02/07/14 [phone]     Chief Complaint  Patient presents with  . Chest Pain     (Consider location/radiation/quality/duration/timing/severity/associated sxs/prior Treatment) HPI  Patient reports yesterday he started having a frontal headache that he states is sharp and constant. He states bending over or coughing makes the headache worse. He states he had brown rhinorrhea yesterday. He has a sore throat but he is able to swallow. He states he's coughing up brown sputum. He states he feels short of breath and he is having wheezing which she's not had before. He thinks he may have had a fever and chills but he has not checked his temperature. He has diffuse anterior chest pain that is constant since yesterday and he describes as sharp. He has had nausea without vomiting or diarrhea. He states he feels dizzy all the time whether he's lying down or standing up. He states some aspects of how he feels feels like when he had his heart attack and others don't.  PCP [facility] [place], [place]  Dr Kellie Shropshire  Past Medical History  Diagnosis Date  . Hypertension   . Hepatitis C   . Cirrhosis of liver   . Depression   . Gallstones   . Chronic back pain   . Left leg paresthesias   . Vertigo   . Hypercholesteremia   . Chronic neck pain   . Headache(784.0)   . Complicated migraine 0/93/2355  . Myocardial infarction   . Coronary artery disease   . Stroke     TIA  . Cancer    Past Surgical History  Procedure Laterality Date  . Orthopedic surgery      "on my legs"  . Bypass graft      triple bypass surgery  . Coronary artery bypass graft    . Hand surgery  1984   Family History  Problem Relation Age of Onset  . Diabetes Brother   . Diabetes Sister   . Diabetes Father   . Diabetes Mother   . Hypertension Mother   . Hypertension Father   . Hypertension Sister   . Hypertension Brother     History  Substance Use Topics  . Smoking status: Former Smoker -- 1.00 packs/day for 35 years    Quit date: 01/25/2009  . Smokeless tobacco: Never Used  . Alcohol Use: No     Comment: former alcoholic  applying for disability   Review of Systems  All other systems reviewed and are negative.     Allergies  Bee venom  Home Medications   Prior to Admission medications   Medication Sig Start Date End Date Taking? Authorizing Provider  aspirin 81 MG chewable tablet Chew 81 mg by mouth every morning.    Yes Historical Provider, MD  atorvastatin (LIPITOR) 10 MG tablet Take 10 mg by mouth daily.   Yes Historical Provider, MD  capsaicin (ZOSTRIX) 0.025 % cream Apply topically 2 (two) times daily.   Yes Historical Provider, MD  Carboxymethylcell-Hypromellose (GENTEAL) 0.25-0.3 % GEL Apply 1 application to eye 2 (two) times daily. 7.3% one application to both eyes twice a day for ocular dryness   Yes Historical Provider, MD  carboxymethylcellulose (REFRESH PLUS) 0.5 % SOLN 1 drop 3 (three) times daily as needed.   Yes Historical Provider, MD  carvedilol (COREG) 12.5 MG tablet Take 6.25 mg by mouth 2 (two) times daily with  a meal.   Yes Historical Provider, MD  cetirizine (ZYRTEC) 10 MG tablet Take 10 mg by mouth every morning.    Yes Historical Provider, MD  finasteride (PROSCAR) 5 MG tablet Take 5 mg by mouth daily.   Yes Historical Provider, MD  furosemide (LASIX) 20 MG tablet Take 20 mg by mouth.   Yes Historical Provider, MD  gabapentin (NEURONTIN) 300 MG capsule Take 600 mg by mouth 3 (three) times daily.   Yes Historical Provider, MD  glucose blood test strip 1 each by Other route as needed for other. Use as instructed   Yes Historical Provider, MD  hydroxypropyl methylcellulose / hypromellose (ISOPTO TEARS / GONIOVISC) 2.5 % ophthalmic solution 1 drop.   Yes Historical Provider, MD  [employer] [place] by Does not apply route.   Yes Historical Provider, MD  losartan (COZAAR) 50 MG  tablet Take 50 mg by mouth daily.   Yes Historical Provider, MD  magnesium oxide (MAG-OX) 400 MG tablet Take 800 mg by mouth daily.   Yes Historical Provider, MD  meloxicam (MOBIC) 15 MG tablet Take 15 mg by mouth daily.   Yes Historical Provider, MD  metFORMIN (GLUCOPHAGE) 500 MG tablet Take by mouth daily with breakfast.   Yes Historical Provider, MD  METHYL SALICYLATE-LIDO-MENTHOL TD Apply topically.   Yes Historical Provider, MD  potassium chloride (K-DUR) 10 MEQ tablet Take 10 mEq by mouth daily.   Yes Historical Provider, MD  tamsulosin (FLOMAX) 0.4 MG CAPS capsule Take 0.8 mg by mouth daily after supper.   Yes Historical Provider, MD  traZODone (DESYREL) 150 MG tablet Take 300 mg by mouth at bedtime.   Yes Historical Provider, MD  triamcinolone cream (KENALOG) 0.1 % Apply 1 application topically 2 (two) times daily.   Yes Historical Provider, MD  venlafaxine XR (EFFEXOR-XR) 150 MG 24 hr capsule Take 150 mg by mouth daily with breakfast.   Yes Historical Provider, MD  amLODipine (NORVASC) 10 MG tablet Take 5 mg by mouth daily.    Historical Provider, MD  cholecalciferol (VITAMIN D) 1000 UNITS tablet Take 1,000 Units by mouth daily.    Historical Provider, MD  docusate sodium 100 MG CAPS Take 100 mg by mouth 2 (two) times daily. 10/13/12   Samella Parr, NP  fish oil-omega-3 fatty acids 1000 MG capsule Take 3 g by mouth 2 (two) times daily.    Historical Provider, MD  levofloxacin (LEVAQUIN) 500 MG tablet Take 1 tablet (500 mg total) by mouth daily. 10/13/12   Samella Parr, NP  lisinopril (PRINIVIL,ZESTRIL) 40 MG tablet Take 40 mg by mouth daily.    Historical Provider, MD  polyethylene glycol (MIRALAX / GLYCOLAX) packet Take 17 g by mouth daily. 10/13/12   Samella Parr, NP  pravastatin (PRAVACHOL) 20 MG tablet Take 20 mg by mouth daily.    Historical Provider, MD  QUEtiapine (SEROQUEL) 200 MG tablet Take 100 mg by mouth at bedtime.    Historical Provider, MD  sertraline (ZOLOFT) 100 MG  tablet Take 200 mg by mouth every morning.    Historical Provider, MD  traMADol (ULTRAM) 50 MG tablet Take 50 mg by mouth 2 (two) times daily as needed.    Historical Provider, MD   BP 164/102 mmHg  Pulse 83  Temp(Src) 99.8 F (37.7 C) (Oral)  Resp 20  Ht 6\' 1"  (1.854 m)  Wt 240 lb (108.863 kg)  BMI 31.67 kg/m2  SpO2 94%  Vital signs normal except hypertension  Physical Exam  Constitutional: He is oriented to person, place, and time. He appears well-developed and well-nourished.  Non-toxic appearance. He does not appear ill. No distress.  HENT:  Head: Normocephalic and atraumatic.  Right Ear: External ear normal.  Left Ear: External ear normal.  Nose: Nose normal. No mucosal edema or rhinorrhea.  Mouth/Throat: Oropharynx is clear and moist and mucous membranes are normal. No dental abscesses or uvula swelling.  Eyes: Conjunctivae and EOM are normal. Pupils are equal, round, and reactive to light.  Neck: Normal range of motion and full passive range of motion without pain. Neck supple.  Cardiovascular: Normal rate, regular rhythm and normal heart sounds.  Exam reveals no gallop and no friction rub.   No murmur heard. Pulmonary/Chest: Effort normal. No tachypnea. He has decreased breath sounds. He has no wheezes. He has no rhonchi. He has no rales. He exhibits no tenderness and no crepitus.  coughing  Abdominal: Soft. Normal appearance and bowel sounds are normal. He exhibits no distension. There is no tenderness. There is no rebound and no guarding.  Musculoskeletal: Normal range of motion. He exhibits no edema or tenderness.  Moves all extremities well.   Neurological: He is alert and oriented to person, place, and time. He has normal strength. No cranial nerve deficit.  Skin: Skin is warm, dry and intact. No rash noted. No erythema. No pallor.  Psychiatric: He has a normal mood and affect. His speech is normal and behavior is normal. His mood appears not anxious.  Nursing note and  vitals reviewed.   ED Course  Procedures (including critical care time)  Medications  albuterol (PROVENTIL) (2.5 MG/3ML) 0.083% nebulizer solution 5 mg (not administered)  ipratropium (ATROVENT) nebulizer solution 0.5 mg (not administered)  naproxen (NAPROSYN) tablet 500 mg (500 mg Oral Given 02/07/14 0640)  cyclobenzaprine (FLEXERIL) tablet 10 mg (10 mg Oral Given 02/07/14 0640)    Pt turned over to Dr Roderic Palau at the change of shift to get CXR results, lab results and reexamine his lungs after his nebulizer.   Labs Review Labs pending  Imaging Review No results found.   EKG Interpretation   Date/Time:  Thursday February 07 2014 06:08:11 EST Ventricular Rate:  82 PR Interval:  187 QRS Duration: 97 QT Interval:  347 QTC Calculation: 405 R Axis:   89 Text Interpretation:  Sinus rhythm Probable left atrial enlargement LVH  with secondary repolarization abnormality No significant change since last  tracing 11 Oct 2012 Confirmed by [facility]  MD-I, Mohsen Odenthal ([id number]) on 02/07/2014  6:13:21 AM      MDM   Final diagnoses:  Sinus headache  Chest pain, unspecified chest pain type  Bronchitis with bronchospasm   Disposition pending per Dr Roderic Palau   Rolland Porter, MD, Alanson Aly, MD 02/07/14 [phone]

## 2014-02-07 NOTE — ED Notes (Signed)
Pt c/o chest pain, dizziness, sob and body aches.

## 2014-02-07 NOTE — Discharge Instructions (Signed)
Drink plenty of fluids.   Follow up if not improving.

## 2014-02-07 NOTE — ED Notes (Signed)
Patient with no complaints at this time. Respirations even and unlabored. Skin warm/dry. Discharge instructions reviewed with patient at this time. Patient given opportunity to voice concerns/ask questions. Patient discharged at this time and left Emergency Department with steady gait.   

## 2014-08-06 ENCOUNTER — Emergency Department (HOSPITAL_COMMUNITY)
Admission: EM | Admit: 2014-08-06 | Discharge: 2014-08-06 | Disposition: A | Discharge status: Home / Self Care | Payer: Medicaid Other | Attending: Emergency Medicine | Admitting: Emergency Medicine

## 2014-08-06 ENCOUNTER — Encounter (HOSPITAL_COMMUNITY): Payer: Self-pay | Admitting: Emergency Medicine

## 2014-08-06 DIAGNOSIS — T22221A Burn of second degree of right elbow, initial encounter: Secondary | ICD-10-CM | POA: Insufficient documentation

## 2014-08-06 DIAGNOSIS — G43909 Migraine, unspecified, not intractable, without status migrainosus: Secondary | ICD-10-CM | POA: Diagnosis not present

## 2014-08-06 DIAGNOSIS — Z79899 Other long term (current) drug therapy: Secondary | ICD-10-CM | POA: Insufficient documentation

## 2014-08-06 DIAGNOSIS — T22231A Burn of second degree of right upper arm, initial encounter: Secondary | ICD-10-CM

## 2014-08-06 DIAGNOSIS — Z87891 Personal history of nicotine dependence: Secondary | ICD-10-CM | POA: Diagnosis not present

## 2014-08-06 DIAGNOSIS — Z791 Long term (current) use of non-steroidal anti-inflammatories (NSAID): Secondary | ICD-10-CM | POA: Insufficient documentation

## 2014-08-06 DIAGNOSIS — G8929 Other chronic pain: Secondary | ICD-10-CM | POA: Insufficient documentation

## 2014-08-06 DIAGNOSIS — I2510 Atherosclerotic heart disease of native coronary artery without angina pectoris: Secondary | ICD-10-CM | POA: Diagnosis not present

## 2014-08-06 DIAGNOSIS — Z8673 Personal history of transient ischemic attack (TIA), and cerebral infarction without residual deficits: Secondary | ICD-10-CM | POA: Diagnosis not present

## 2014-08-06 DIAGNOSIS — Z8619 Personal history of other infectious and parasitic diseases: Secondary | ICD-10-CM | POA: Insufficient documentation

## 2014-08-06 DIAGNOSIS — I10 Essential (primary) hypertension: Secondary | ICD-10-CM | POA: Diagnosis not present

## 2014-08-06 DIAGNOSIS — W369XXA Explosion and rupture of unspecified gas cylinder, initial encounter: Secondary | ICD-10-CM | POA: Insufficient documentation

## 2014-08-06 DIAGNOSIS — Y999 Unspecified external cause status: Secondary | ICD-10-CM | POA: Insufficient documentation

## 2014-08-06 DIAGNOSIS — Y939 Activity, unspecified: Secondary | ICD-10-CM | POA: Diagnosis not present

## 2014-08-06 DIAGNOSIS — Z8719 Personal history of other diseases of the digestive system: Secondary | ICD-10-CM | POA: Insufficient documentation

## 2014-08-06 DIAGNOSIS — Z859 Personal history of malignant neoplasm, unspecified: Secondary | ICD-10-CM | POA: Insufficient documentation

## 2014-08-06 DIAGNOSIS — F329 Major depressive disorder, single episode, unspecified: Secondary | ICD-10-CM | POA: Insufficient documentation

## 2014-08-06 DIAGNOSIS — E780 Pure hypercholesterolemia: Secondary | ICD-10-CM | POA: Insufficient documentation

## 2014-08-06 DIAGNOSIS — I252 Old myocardial infarction: Secondary | ICD-10-CM | POA: Diagnosis not present

## 2014-08-06 DIAGNOSIS — Z23 Encounter for immunization: Secondary | ICD-10-CM | POA: Insufficient documentation

## 2014-08-06 DIAGNOSIS — Y929 Unspecified place or not applicable: Secondary | ICD-10-CM | POA: Diagnosis not present

## 2014-08-06 DIAGNOSIS — T22021A Burn of unspecified degree of right elbow, initial encounter: Secondary | ICD-10-CM | POA: Diagnosis present

## 2014-08-06 MED ORDER — ONDANSETRON HCL 4 MG/2ML IJ SOLN
4.0000 mg | Freq: Once | INTRAMUSCULAR | Status: AC
  Administered 2014-08-06: 4 mg via INTRAVENOUS
  Filled 2014-08-06: qty 2

## 2014-08-06 MED ORDER — TETANUS-DIPHTH-ACELL PERTUSSIS 5-2.5-18.5 LF-MCG/0.5 IM SUSP
0.5000 mL | Freq: Once | INTRAMUSCULAR | Status: AC
  Administered 2014-08-06: 0.5 mL via INTRAMUSCULAR
  Filled 2014-08-06: qty 0.5

## 2014-08-06 MED ORDER — SODIUM CHLORIDE 0.9 % IV BOLUS (SEPSIS)
1000.0000 mL | Freq: Once | INTRAVENOUS | Status: AC
  Administered 2014-08-06: 1000 mL via INTRAVENOUS

## 2014-08-06 MED ORDER — HYDROMORPHONE HCL 1 MG/ML IJ SOLN
1.0000 mg | Freq: Once | INTRAMUSCULAR | Status: AC
  Administered 2014-08-06: 1 mg via INTRAVENOUS
  Filled 2014-08-06: qty 1

## 2014-08-06 MED ORDER — KETOROLAC TROMETHAMINE 30 MG/ML IJ SOLN
30.0000 mg | Freq: Once | INTRAMUSCULAR | Status: AC
  Administered 2014-08-06: 30 mg via INTRAVENOUS
  Filled 2014-08-06: qty 1

## 2014-08-06 MED ORDER — OXYCODONE-ACETAMINOPHEN 5-325 MG PO TABS: 1.0000 | ORAL_TABLET | Freq: Four times a day (QID) | ORAL | Status: DC | PRN

## 2014-08-06 MED ORDER — OXYCODONE-ACETAMINOPHEN 5-325 MG PO TABS: 2.0000 | ORAL_TABLET | ORAL | Status: DC | PRN

## 2014-08-06 MED ORDER — SILVER SULFADIAZINE 1 % EX CREA
TOPICAL_CREAM | Freq: Once | CUTANEOUS | Status: AC
  Administered 2014-08-06: 20:00:00 via TOPICAL
  Filled 2014-08-06: qty 50

## 2014-08-06 NOTE — Discharge Instructions (Signed)
Burn Care Burns hurt your skin. When your skin is hurt, it is easier to get an infection. Follow your doctor's directions to help prevent an infection. HOME CARE  Wash your hands well before you change your bandage.  Change your bandage as often as told by your doctor.  Remove the old bandage. If the bandage sticks, soak it off with cool, clean water.  Gently clean the burn with mild soap and water.  Pat the burn dry with a clean, dry cloth.  Put a thin layer of medicated cream on the burn.  Put a clean bandage on as told by your doctor.  Keep the bandage clean and dry.  Raise (elevate) the burn for the first 24 hours. After that, follow your doctor's directions.  Only take medicine as told by your doctor. GET HELP RIGHT AWAY IF:   You have too much pain.  The skin near the burn is red, tender, puffy (swollen), or has red streaks.  The burn area has yellowish white fluid (pus) or a bad smell coming from it.  You have a fever. MAKE SURE YOU:   Understand these instructions.  Will watch your condition.  Will get help right away if you are not doing well or get worse. Document Released: 10/21/2007 Document Revised: 04/05/2011 Document Reviewed: 06/03/2010 [facility] Patient Information 2015 [place], [place]. This information is not intended to replace advice given to you by your health care provider. Make sure you discuss any questions you have with your health care provider.   Rinse wound daily with soap and water. Apply Silvadene dressing. Medication for pain. Recheck in 2 days.
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
## 2015-02-17 ENCOUNTER — Institutional Professional Consult (permissible substitution): Payer: Non-veteran care | Admitting: Pulmonary Disease

## 2016-09-30 ENCOUNTER — Telehealth (HOSPITAL_COMMUNITY): Payer: Self-pay | Admitting: Physical Therapy

## 2016-10-01 ENCOUNTER — Ambulatory Visit (INDEPENDENT_AMBULATORY_CARE_PROVIDER_SITE_OTHER): Payer: Non-veteran care | Admitting: Podiatry

## 2016-10-01 ENCOUNTER — Ambulatory Visit (INDEPENDENT_AMBULATORY_CARE_PROVIDER_SITE_OTHER): Payer: Non-veteran care

## 2016-10-01 ENCOUNTER — Encounter: Payer: Self-pay | Admitting: Podiatry

## 2016-10-01 VITALS — BP 148/88 | HR 59

## 2016-10-01 DIAGNOSIS — M7751 Other enthesopathy of right foot: Secondary | ICD-10-CM

## 2016-10-01 DIAGNOSIS — M722 Plantar fascial fibromatosis: Secondary | ICD-10-CM

## 2016-10-01 DIAGNOSIS — M659 Synovitis and tenosynovitis, unspecified: Secondary | ICD-10-CM

## 2016-10-01 MED ORDER — DICLOFENAC SODIUM 75 MG PO TBEC: 75.0000 mg | DELAYED_RELEASE_TABLET | Freq: Two times a day (BID) | ORAL | 0 refills | Status: DC
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
## 2016-10-08 MED ORDER — BETAMETHASONE SOD PHOS & ACET 6 (3-3) MG/ML IJ SUSP: 3.0000 mg | Freq: Once | INTRAMUSCULAR | Status: DC

## 2016-10-11 ENCOUNTER — Telehealth (HOSPITAL_COMMUNITY): Payer: Self-pay | Admitting: Physical Therapy

## 2016-10-11 NOTE — Telephone Encounter (Signed)
Has cancer treatments in the morning, needs afternoon apptments.

## 2016-10-13 ENCOUNTER — Ambulatory Visit (HOSPITAL_COMMUNITY): Payer: Non-veteran care | Admitting: Physical Therapy

## 2016-10-13 ENCOUNTER — Encounter (HOSPITAL_COMMUNITY): Payer: Self-pay

## 2016-10-18 ENCOUNTER — Ambulatory Visit (HOSPITAL_COMMUNITY): Payer: Non-veteran care | Attending: Internal Medicine | Admitting: Physical Therapy

## 2016-11-12 ENCOUNTER — Ambulatory Visit: Payer: Non-veteran care | Admitting: Podiatry

## 2016-12-19 ENCOUNTER — Encounter (HOSPITAL_COMMUNITY): Payer: Self-pay | Admitting: *Deleted

## 2016-12-19 ENCOUNTER — Emergency Department (HOSPITAL_COMMUNITY)
Admission: EM | Admit: 2016-12-19 | Discharge: 2016-12-19 | Disposition: A | Discharge status: Home / Self Care | Payer: Non-veteran care | Attending: Emergency Medicine | Admitting: Emergency Medicine

## 2016-12-19 ENCOUNTER — Other Ambulatory Visit: Payer: Self-pay

## 2016-12-19 DIAGNOSIS — Z951 Presence of aortocoronary bypass graft: Secondary | ICD-10-CM | POA: Insufficient documentation

## 2016-12-19 DIAGNOSIS — I4510 Unspecified right bundle-branch block: Secondary | ICD-10-CM

## 2016-12-19 DIAGNOSIS — I2510 Atherosclerotic heart disease of native coronary artery without angina pectoris: Secondary | ICD-10-CM | POA: Insufficient documentation

## 2016-12-19 DIAGNOSIS — Z794 Long term (current) use of insulin: Secondary | ICD-10-CM | POA: Diagnosis not present

## 2016-12-19 DIAGNOSIS — Z7982 Long term (current) use of aspirin: Secondary | ICD-10-CM | POA: Diagnosis not present

## 2016-12-19 DIAGNOSIS — I445 Left posterior fascicular block: Secondary | ICD-10-CM

## 2016-12-19 DIAGNOSIS — Z79899 Other long term (current) drug therapy: Secondary | ICD-10-CM | POA: Diagnosis not present

## 2016-12-19 DIAGNOSIS — D696 Thrombocytopenia, unspecified: Secondary | ICD-10-CM

## 2016-12-19 DIAGNOSIS — N289 Disorder of kidney and ureter, unspecified: Secondary | ICD-10-CM | POA: Diagnosis not present

## 2016-12-19 DIAGNOSIS — I10 Essential (primary) hypertension: Secondary | ICD-10-CM | POA: Insufficient documentation

## 2016-12-19 DIAGNOSIS — R51 Headache: Secondary | ICD-10-CM | POA: Diagnosis not present

## 2016-12-19 DIAGNOSIS — Z87891 Personal history of nicotine dependence: Secondary | ICD-10-CM | POA: Diagnosis not present

## 2016-12-19 DIAGNOSIS — R519 Headache, unspecified: Secondary | ICD-10-CM

## 2016-12-19 LAB — CBC WITH DIFFERENTIAL/PLATELET
BASOS ABS: 0 10*3/uL (ref 0.0–0.1)
BASOS PCT: 0 %
EOS PCT: 6 %
Eosinophils Absolute: 0.5 10*3/uL (ref 0.0–0.7)
HEMATOCRIT: 44.2 % (ref 39.0–52.0)
Hemoglobin: 14.5 g/dL (ref 13.0–17.0)
Lymphocytes Relative: 29 %
Lymphs Abs: 2.6 10*3/uL (ref 0.7–4.0)
MCH: 29.4 pg (ref 26.0–34.0)
MCHC: 32.8 g/dL (ref 30.0–36.0)
MCV: 89.5 fL (ref 78.0–100.0)
MONO ABS: 0.8 10*3/uL (ref 0.1–1.0)
MONOS PCT: 8 %
NEUTROS ABS: 5.2 10*3/uL (ref 1.7–7.7)
Neutrophils Relative %: 57 %
PLATELETS: 131 10*3/uL — AB (ref 150–400)
RBC: 4.94 MIL/uL (ref 4.22–5.81)
RDW: 14 % (ref 11.5–15.5)
WBC: 9.1 10*3/uL (ref 4.0–10.5)

## 2016-12-19 LAB — BASIC METABOLIC PANEL
Anion gap: 11 (ref 5–15)
BUN: 19 mg/dL (ref 6–20)
CALCIUM: 9.5 mg/dL (ref 8.9–10.3)
CO2: 24 mmol/L (ref 22–32)
Chloride: 101 mmol/L (ref 101–111)
Creatinine, Ser: 2.14 mg/dL — ABNORMAL HIGH (ref 0.61–1.24)
GFR calc Af Amer: 37 mL/min — ABNORMAL LOW (ref >60)
GFR, EST NON AFRICAN AMERICAN: 32 mL/min — AB (ref >60)
GLUCOSE: 213 mg/dL — AB (ref 65–99)
Potassium: 3.3 mmol/L — ABNORMAL LOW (ref 3.5–5.1)
Sodium: 136 mmol/L (ref 135–145)

## 2016-12-19 MED ORDER — METOCLOPRAMIDE HCL 5 MG/ML IJ SOLN
10.0000 mg | Freq: Once | INTRAMUSCULAR | Status: AC
  Administered 2016-12-19: 10 mg via INTRAVENOUS
  Filled 2016-12-19: qty 2

## 2016-12-19 MED ORDER — FLUORESCEIN SODIUM 1 MG OP STRP
ORAL_STRIP | OPHTHALMIC | Status: AC
  Filled 2016-12-19: qty 1

## 2016-12-19 MED ORDER — SODIUM CHLORIDE 0.9 % IV BOLUS (SEPSIS)
1000.0000 mL | Freq: Once | INTRAVENOUS | Status: AC
  Administered 2016-12-19: 1000 mL via INTRAVENOUS

## 2016-12-19 MED ORDER — POTASSIUM CHLORIDE CRYS ER 20 MEQ PO TBCR
40.0000 meq | EXTENDED_RELEASE_TABLET | Freq: Once | ORAL | Status: AC
  Administered 2016-12-19: 40 meq via ORAL
  Filled 2016-12-19: qty 2

## 2016-12-19 MED ORDER — DIPHENHYDRAMINE HCL 50 MG/ML IJ SOLN
25.0000 mg | Freq: Once | INTRAMUSCULAR | Status: AC
  Administered 2016-12-19: 25 mg via INTRAVENOUS
  Filled 2016-12-19: qty 1

## 2016-12-19 NOTE — Discharge Instructions (Signed)
Your ECG

## 2016-12-19 NOTE — ED Triage Notes (Signed)
Pt states felt funny & had headache last night around 9 when took his nightly meds. Woke up feeling dizzy & BP was low.

## 2016-12-19 NOTE — ED Provider Notes (Signed)
[facility] EMERGENCY DEPARTMENT Provider Note   CSN: [id number] Arrival date & time: 12/19/16  0159     History   Chief Complaint Chief Complaint  Patient presents with  . Hypotension    HPI [name] is a 56 y.o. male.  The history is provided by the patient.  He woke up at about 1:30 AM with a throbbing, pounding bifrontal headache.  There is no visual disturbance.  There is mild nausea but no vomiting.  He felt generally weak and checked his blood pressure and noted that it was very low.  He checked it several times, and the blood pressure never got above 80 systolic.  He denies chest pain, heaviness, tightness, pressure.  He rates his headache at 10/10.  Nothing makes it better, nothing makes it worse.  He has not taken anything for his headache.  He has had similar headaches in the past.  Past Medical History:  Diagnosis Date  . Cancer ([place])   . Chronic back pain   . Chronic neck pain   . Cirrhosis of liver ([place])   . Complicated migraine 05/04/7351  . Coronary artery disease   . Depression   . Gallstones   . Headache(784.0)   . Hepatitis C   . Hypercholesteremia   . Hypertension   . Left leg paresthesias   . Myocardial infarction ([place])   . Stroke [facility])    TIA  . Vertigo     Patient Active Problem List   Diagnosis Date Noted  . Hypotension 10/11/2012  . CAD (coronary artery disease) 10/11/2012  . S/P CABG (coronary artery bypass graft) 10/11/2012  . Cervical spondylosis 09/22/2011  . Neck pain 09/22/2011  . Syncope 06/20/2011  . ARF (acute renal failure) ([place]) 06/20/2011  . Complicated migraine 29/92/4268  . Hypertension   . Hepatitis C   . Cirrhosis of liver ([place])   . Depression   . Chronic back pain   . Left leg paresthesias   . Hypercholesteremia   . Chronic neck pain   . TMHDQQIW(979.8)     Past Surgical History:  Procedure Laterality Date  . BYPASS GRAFT     triple bypass surgery  . CARDIAC SURGERY    . CORONARY ARTERY BYPASS  GRAFT    . HAND SURGERY  1984  . ORTHOPEDIC SURGERY     "on my legs"       Home Medications    Prior to Admission medications   Medication Sig Start Date End Date Taking? Authorizing Provider  amLODipine (NORVASC) 10 MG tablet Take 5 mg by mouth daily.   Yes [provider]  aspirin 81 MG chewable tablet Chew 81 mg by mouth every morning.    Yes [provider]  atorvastatin (LIPITOR) 20 MG tablet Take 20 mg by mouth daily.   Yes [provider]  carvedilol (COREG) 25 MG tablet Take 25 mg by mouth 2 (two) times daily with a meal.   Yes [provider]  cetirizine (ZYRTEC) 10 MG tablet Take 10 mg by mouth daily.   Yes [provider]  Cholecalciferol (VITAMIN D3) 1000 units CAPS Take by mouth.   Yes [provider]  colchicine 0.6 MG tablet Take 0.6 mg by mouth daily.   Yes [provider]  diclofenac (VOLTAREN) 75 MG EC tablet Take 1 tablet (75 mg total) by mouth 2 (two) times daily. 10/01/16  Yes Edrick Kins, DPM  diphenhydrAMINE (BENADRYL) 25 mg capsule Take 25 mg by mouth  every 6 (six) hours as needed.   Yes [provider]  ergocalciferol (VITAMIN D2) 50000 units capsule Take 50,000 Units by mouth once a week.   Yes [provider]  etodolac (LODINE) 400 MG tablet Take 400 mg by mouth 2 (two) times daily.   Yes [provider]  finasteride (PROSCAR) 5 MG tablet Take 5 mg by mouth daily.   Yes [provider]  fluticasone (FLONASE) 50 MCG/ACT nasal spray Place into both nostrils daily.   Yes [provider]  gabapentin (NEURONTIN) 300 MG capsule Take 300 mg by mouth 3 (three) times daily.   Yes [provider]  Glucose Blood (PRECISION XTRA BLOOD GLUCOSE VI) by In Vitro route.   Yes [provider]  insulin aspart (NOVOLOG) 100 UNIT/ML FlexPen Inject into the skin 3 (three) times daily with meals.   Yes [provider]  insulin glargine (LANTUS) 100  UNIT/ML injection Inject into the skin at bedtime.   Yes [provider]  isosorbide mononitrate (IMDUR) 30 MG 24 hr tablet Take 30 mg by mouth daily.   Yes [provider]  losartan (COZAAR) 50 MG tablet Take 50 mg by mouth daily.   Yes [provider]  magnesium oxide (MAG-OX) 400 MG tablet Take 400 mg by mouth daily.   Yes [provider]  Melatonin 5 MG TABS Take by mouth.   Yes [provider]  meloxicam (MOBIC) 15 MG tablet Take 15 mg by mouth daily.   Yes [provider]  metFORMIN (GLUCOPHAGE) 500 MG tablet Take by mouth 2 (two) times daily with a meal.   Yes [provider]  naloxone (NARCAN) nasal spray 4 mg/0.1 mL Place 1 spray into the nose.   Yes [provider]  oxybutynin (DITROPAN) 5 MG tablet Take 5 mg by mouth 3 (three) times daily.   Yes [provider]  potassium chloride SA (K-DUR,KLOR-CON) 20 MEQ tablet Take 20 mEq by mouth 2 (two) times daily.   Yes [provider]  PROCHLORPERAZINE MALEATE PO Take by mouth.   Yes [provider]  QUEtiapine (SEROQUEL) 200 MG tablet Take 200 mg by mouth at bedtime.   Yes [provider]  SILDENAFIL CITRATE PO Take by mouth.   Yes [provider]  tamsulosin (FLOMAX) 0.4 MG CAPS capsule Take 0.4 mg by mouth.   Yes [provider]  traMADol (ULTRAM) 50 MG tablet Take by mouth every 6 (six) hours as needed.   Yes [provider]  venlafaxine (EFFEXOR) 75 MG tablet Take 75 mg by mouth 2 (two) times daily.   Yes [provider]    Family History Family History  Problem Relation Age of Onset  . Diabetes Brother   . Diabetes Sister   . Diabetes Father   . Hypertension Father   . Diabetes Mother   . Hypertension Mother   . Hypertension Sister   . Hypertension Brother     Social History Social History   Tobacco Use  . Smoking status: Former Smoker    Packs/day: 1.00    Years: 35.00    Pack  years: 35.00    Last attempt to quit: 01/25/2009    Years since quitting: 7.9  . Smokeless tobacco: Never Used  Substance Use Topics  . Alcohol use: No    Comment: former alcoholic  . Drug use: No    Comment: former cocaine abuse     Allergies   Bee venom and Questran [cholestyramine]  Review of Systems Review of Systems  All other systems reviewed and are negative.    Physical Exam Updated Vital Signs BP 118/78 (BP Location: Right Arm)   Pulse 60   Temp (!) 97.2 F (36.2 C) (Oral)   Resp 16   Ht 6\' 3"  (1.905 m)   Wt 122 kg (269 lb)   SpO2 96%   BMI 33.62 kg/m   Physical Exam  Nursing note and vitals reviewed.  56 year old male, resting comfortably and in no acute distress. Vital signs are normal. Oxygen saturation is 96%, which is normal. Head is normocephalic and atraumatic. PERRLA, EOMI. Oropharynx is clear. Neck is nontender and supple without adenopathy or JVD. Back is nontender and there is no CVA tenderness. Lungs are clear without rales, wheezes, or rhonchi. Chest is nontender. Heart has regular rate and rhythm without murmur. Abdomen is soft, flat, nontender without masses or hepatosplenomegaly and peristalsis is normoactive. Extremities have no cyanosis or edema, full range of motion is present. Skin is warm and dry without rash. Neurologic: Mental status is normal, cranial nerves are intact, there are no motor or sensory deficits.  ED Treatments / Results  Labs (all labs ordered are listed, but only abnormal results are displayed) Labs Reviewed  BASIC METABOLIC PANEL - Abnormal; Notable for the following components:      Result Value   Potassium 3.3 (*)    Glucose, Bld 213 (*)    Creatinine, Ser 2.14 (*)    GFR calc non Af Amer 32 (*)    GFR calc Af Amer 37 (*)    All other components within normal limits  CBC WITH DIFFERENTIAL/PLATELET - Abnormal; Notable for the following components:   Platelets 131 (*)    All other components within normal  limits    EKG  EKG Interpretation  Date/Time:  Sunday December 19 2016 05:40:56 EST Ventricular Rate:  62 PR Interval:    QRS Duration: 193 QT Interval:  511 QTC Calculation: 519 R Axis:   110 Text Interpretation:  Sinus rhythm Left atrial enlargement RBBB and LPFB When compared with ECG of 02/07/2014, Right bundle branch block is now present Left posterior fasicular block is now present Confirmed by Delora Fuel ([id number]) on 12/19/2016 6:05:26 AM       Procedures Procedures (including critical care time)  Medications Ordered in ED Medications  fluorescein 1 MG ophthalmic strip (  Not Given 12/19/16 0508)  sodium chloride 0.9 % bolus 1,000 mL (not administered)  metoCLOPramide (REGLAN) injection 10 mg (not administered)  diphenhydrAMINE (BENADRYL) injection 25 mg (not administered)     Initial Impression / Assessment and Plan / ED Course  I have reviewed the triage vital signs and the nursing notes.  Pertinent lab results that were available during my care of the patient were reviewed by me and considered in my medical decision making (see chart for details).  Headache of uncertain cause.  No red flags to suggest serious pathology such as subarachnoid hemorrhage or tumor.  Probable muscle contraction headache, possible migraine variant.  He will be given headache cocktail of normal saline, metoclopramide, diphenhydramine.  No evidence of hypotension in the ED.  Old records are reviewed, and he does have a prior hospitalization where he had low blood pressure and required IV fluids.  There was a prior ED visit for sinus headache in 2016.  He feels much better after above-noted treatment.  Laboratory workup shows mild hypokalemia and he is given oral potassium.  He is  not on any diuretics, so I do not believe he will need ongoing potassium supplementation.  There is mild to moderate renal insufficiency present.  Last creatinine on record was normal, but that was 4 years ago.  He also  has prior creatinines which had been higher.  Mild thrombocytopenia is present which is not felt to be clinically significant.  ECG shows right bundle branch block and left posterior fascicular block which had not been present on prior ECG from January 2016.  I have advised patient of these findings and he is to discuss them with his primary care physician at the [facility].  If ECG changes are new, he should be evaluated by cardiology.  Final Clinical Impressions(s) / ED Diagnoses   Final diagnoses:  Headache, unspecified headache type  Renal insufficiency  Right bundle branch block (RBBB)  Left posterior fascicular block (LPFB)  Thrombocytopenia [facility])    ED Discharge Orders    None       Delora Fuel, MD 88/82/80 [phone]

## 2017-01-24 ENCOUNTER — Emergency Department: Payer: Non-veteran care

## 2017-01-24 ENCOUNTER — Other Ambulatory Visit: Payer: Self-pay

## 2017-01-24 ENCOUNTER — Observation Stay
Admission: EM | Admit: 2017-01-24 | Discharge: 2017-01-26 | Disposition: A | Discharge status: Home / Self Care | Payer: Non-veteran care | Attending: Internal Medicine | Admitting: Internal Medicine

## 2017-01-24 DIAGNOSIS — I959 Hypotension, unspecified: Secondary | ICD-10-CM | POA: Diagnosis not present

## 2017-01-24 DIAGNOSIS — K7460 Unspecified cirrhosis of liver: Secondary | ICD-10-CM | POA: Insufficient documentation

## 2017-01-24 DIAGNOSIS — E878 Other disorders of electrolyte and fluid balance, not elsewhere classified: Secondary | ICD-10-CM | POA: Diagnosis not present

## 2017-01-24 DIAGNOSIS — Z23 Encounter for immunization: Secondary | ICD-10-CM | POA: Insufficient documentation

## 2017-01-24 DIAGNOSIS — F329 Major depressive disorder, single episode, unspecified: Secondary | ICD-10-CM | POA: Insufficient documentation

## 2017-01-24 DIAGNOSIS — Z794 Long term (current) use of insulin: Secondary | ICD-10-CM | POA: Diagnosis not present

## 2017-01-24 DIAGNOSIS — G8929 Other chronic pain: Secondary | ICD-10-CM | POA: Diagnosis not present

## 2017-01-24 DIAGNOSIS — I2510 Atherosclerotic heart disease of native coronary artery without angina pectoris: Secondary | ICD-10-CM | POA: Diagnosis not present

## 2017-01-24 DIAGNOSIS — B1920 Unspecified viral hepatitis C without hepatic coma: Secondary | ICD-10-CM | POA: Diagnosis not present

## 2017-01-24 DIAGNOSIS — Z87891 Personal history of nicotine dependence: Secondary | ICD-10-CM | POA: Diagnosis not present

## 2017-01-24 DIAGNOSIS — Z8673 Personal history of transient ischemic attack (TIA), and cerebral infarction without residual deficits: Secondary | ICD-10-CM | POA: Insufficient documentation

## 2017-01-24 DIAGNOSIS — K921 Melena: Secondary | ICD-10-CM | POA: Diagnosis not present

## 2017-01-24 DIAGNOSIS — I252 Old myocardial infarction: Secondary | ICD-10-CM | POA: Diagnosis not present

## 2017-01-24 DIAGNOSIS — Z7982 Long term (current) use of aspirin: Secondary | ICD-10-CM | POA: Insufficient documentation

## 2017-01-24 DIAGNOSIS — R479 Unspecified speech disturbances: Secondary | ICD-10-CM

## 2017-01-24 DIAGNOSIS — R55 Syncope and collapse: Secondary | ICD-10-CM | POA: Diagnosis present

## 2017-01-24 DIAGNOSIS — N179 Acute kidney failure, unspecified: Secondary | ICD-10-CM | POA: Diagnosis present

## 2017-01-24 DIAGNOSIS — E119 Type 2 diabetes mellitus without complications: Secondary | ICD-10-CM | POA: Insufficient documentation

## 2017-01-24 DIAGNOSIS — E876 Hypokalemia: Secondary | ICD-10-CM | POA: Diagnosis not present

## 2017-01-24 DIAGNOSIS — E7800 Pure hypercholesterolemia, unspecified: Secondary | ICD-10-CM | POA: Diagnosis not present

## 2017-01-24 DIAGNOSIS — I10 Essential (primary) hypertension: Secondary | ICD-10-CM | POA: Insufficient documentation

## 2017-01-24 DIAGNOSIS — Z79899 Other long term (current) drug therapy: Secondary | ICD-10-CM | POA: Insufficient documentation

## 2017-01-24 LAB — BASIC METABOLIC PANEL
Anion gap: 10 (ref 5–15)
BUN: 44 mg/dL — AB (ref 6–20)
CHLORIDE: 97 mmol/L — AB (ref 101–111)
CO2: 28 mmol/L (ref 22–32)
CREATININE: 3.76 mg/dL — AB (ref 0.61–1.24)
Calcium: 9.6 mg/dL (ref 8.9–10.3)
GFR calc Af Amer: 19 mL/min — ABNORMAL LOW (ref >60)
GFR calc non Af Amer: 16 mL/min — ABNORMAL LOW (ref >60)
GLUCOSE: 173 mg/dL — AB (ref 65–99)
Potassium: 3.3 mmol/L — ABNORMAL LOW (ref 3.5–5.1)
SODIUM: 135 mmol/L (ref 135–145)

## 2017-01-24 LAB — CBC
HCT: 41 % (ref 40.0–52.0)
Hemoglobin: 13.7 g/dL (ref 13.0–18.0)
MCH: 29 pg (ref 26.0–34.0)
MCHC: 33.5 g/dL (ref 32.0–36.0)
MCV: 86.7 fL (ref 80.0–100.0)
PLATELETS: 142 10*3/uL — AB (ref 150–440)
RBC: 4.72 MIL/uL (ref 4.40–5.90)
RDW: 13.2 % (ref 11.5–14.5)
WBC: 7.8 10*3/uL (ref 3.8–10.6)

## 2017-01-24 LAB — GLUCOSE, CAPILLARY
Glucose-Capillary: 151 mg/dL — ABNORMAL HIGH (ref 65–99)
Glucose-Capillary: 175 mg/dL — ABNORMAL HIGH (ref 65–99)

## 2017-01-24 MED ORDER — ACETAMINOPHEN 325 MG PO TABS: 650.0000 mg | ORAL_TABLET | Freq: Four times a day (QID) | ORAL | Status: DC | PRN

## 2017-01-24 MED ORDER — POLYETHYLENE GLYCOL 3350 17 G PO PACK: 17.0000 g | PACK | Freq: Every day | ORAL | Status: DC | PRN

## 2017-01-24 MED ORDER — INSULIN ASPART 100 UNIT/ML ~~LOC~~ SOLN
0.0000 [IU] | Freq: Every day | SUBCUTANEOUS | Status: DC
  Administered 2017-01-25: 3 [IU] via SUBCUTANEOUS
  Filled 2017-01-24: qty 1

## 2017-01-24 MED ORDER — ACETAMINOPHEN 650 MG RE SUPP: 650.0000 mg | Freq: Four times a day (QID) | RECTAL | Status: DC | PRN

## 2017-01-24 MED ORDER — INSULIN ASPART 100 UNIT/ML ~~LOC~~ SOLN
0.0000 [IU] | Freq: Three times a day (TID) | SUBCUTANEOUS | Status: DC
  Administered 2017-01-25: 11 [IU] via SUBCUTANEOUS
  Administered 2017-01-25: 12:00:00 5 [IU] via SUBCUTANEOUS
  Administered 2017-01-25: 09:00:00 8 [IU] via SUBCUTANEOUS
  Administered 2017-01-26: 5 [IU] via SUBCUTANEOUS
  Filled 2017-01-24 (×3): qty 1

## 2017-01-24 MED ORDER — SODIUM CHLORIDE 0.9 % IV BOLUS (SEPSIS)
1000.0000 mL | Freq: Once | INTRAVENOUS | Status: AC
  Administered 2017-01-24: 1000 mL via INTRAVENOUS

## 2017-01-24 MED ORDER — HYDROCODONE-ACETAMINOPHEN 5-325 MG PO TABS
1.0000 | ORAL_TABLET | ORAL | Status: DC | PRN
  Administered 2017-01-25: 1 via ORAL
  Filled 2017-01-24: qty 1

## 2017-01-24 MED ORDER — ONDANSETRON HCL 4 MG/2ML IJ SOLN: 4.0000 mg | Freq: Four times a day (QID) | INTRAMUSCULAR | Status: DC | PRN

## 2017-01-24 MED ORDER — ADULT MULTIVITAMIN W/MINERALS CH
1.0000 | ORAL_TABLET | Freq: Every day | ORAL | Status: DC
Start: 2017-01-25 — End: 2017-01-26
  Administered 2017-01-25 – 2017-01-26 (×2): 1 via ORAL
  Filled 2017-01-24 (×2): qty 1

## 2017-01-24 MED ORDER — PNEUMOCOCCAL VAC POLYVALENT 25 MCG/0.5ML IJ INJ
0.5000 mL | INJECTION | INTRAMUSCULAR | Status: AC
  Administered 2017-01-26: 0.5 mL via INTRAMUSCULAR
  Filled 2017-01-24: qty 0.5

## 2017-01-24 MED ORDER — ONDANSETRON HCL 4 MG PO TABS: 4.0000 mg | ORAL_TABLET | Freq: Four times a day (QID) | ORAL | Status: DC | PRN

## 2017-01-24 MED ORDER — POTASSIUM CHLORIDE IN NACL 20-0.9 MEQ/L-% IV SOLN
INTRAVENOUS | Status: DC
  Administered 2017-01-25 (×2): via INTRAVENOUS
  Filled 2017-01-24 (×2): qty 1000

## 2017-01-24 MED ORDER — HEPARIN SODIUM (PORCINE) 5000 UNIT/ML IJ SOLN
5000.0000 [IU] | Freq: Three times a day (TID) | INTRAMUSCULAR | Status: DC
  Administered 2017-01-25 (×3): 5000 [IU] via SUBCUTANEOUS
  Filled 2017-01-24 (×3): qty 1

## 2017-01-24 NOTE — ED Notes (Signed)
Pt unable to urinate at this moment, given specimen cup for when is able to void. Pt being transported to CT at this time.

## 2017-01-24 NOTE — ED Triage Notes (Signed)
Spoke with Dr Jacqualine Code about sx, no code stroke activated at this time.

## 2017-01-24 NOTE — ED Provider Notes (Signed)
[facility] Emergency Department Provider Note   ____________________________________________   I have reviewed the triage vital signs and the nursing notes.   HISTORY  Chief Complaint Loss of Consciousness   History limited by: Not Limited   HPI [name] is a 56 y.o. male who presents to the emergency department today after syncopal episodes.  DURATION:started today TIMING: 2 discreet episodes SEVERITY: complete LOC QUALITY: LOC CONTEXT: patient states that both times today the episodes occurred while he was standing up. He started feeling some shaking and then passed out. States it happened roughly 10 years ago as well. MODIFYING FACTORS: none ASSOCIATED SYMPTOMS: some slowing of speech. No chest pain. No shortness of breath.  Per medical record review patient has a history of chronic pain issues, is on NSAIDS.   Past Medical History:  Diagnosis Date  . Cancer ([place])   . Chronic back pain   . Chronic neck pain   . Cirrhosis of liver ([place])   . Complicated migraine 1/66/0630  . Coronary artery disease   . Depression   . Gallstones   . Headache(784.0)   . Hepatitis C   . Hypercholesteremia   . Hypertension   . Left leg paresthesias   . Myocardial infarction ([place])   . Stroke [facility])    TIA  . Vertigo     Patient Active Problem List   Diagnosis Date Noted  . Hypotension 10/11/2012  . CAD (coronary artery disease) 10/11/2012  . S/P CABG (coronary artery bypass graft) 10/11/2012  . Cervical spondylosis 09/22/2011  . Neck pain 09/22/2011  . Syncope 06/20/2011  . ARF (acute renal failure) ([place]) 06/20/2011  . Complicated migraine 16/01/930  . Hypertension   . Hepatitis C   . Cirrhosis of liver ([place])   . Depression   . Chronic back pain   . Left leg paresthesias   . Hypercholesteremia   . Chronic neck pain   . TFTDDUKG(254.2)     Past Surgical History:  Procedure Laterality Date  . BYPASS GRAFT     triple bypass surgery   . CARDIAC SURGERY    . CORONARY ARTERY BYPASS GRAFT    . HAND SURGERY  1984  . ORTHOPEDIC SURGERY     "on my legs"    Prior to Admission medications   Medication Sig Start Date End Date Taking? Authorizing Provider  amLODipine (NORVASC) 10 MG tablet Take 5 mg by mouth daily.    [provider]  aspirin 81 MG chewable tablet Chew 81 mg by mouth every morning.     [provider]  atorvastatin (LIPITOR) 20 MG tablet Take 20 mg by mouth daily.    [provider]  carvedilol (COREG) 25 MG tablet Take 25 mg by mouth 2 (two) times daily with a meal.    [provider]  cetirizine (ZYRTEC) 10 MG tablet Take 10 mg by mouth daily.    [provider]  Cholecalciferol (VITAMIN D3) 1000 units CAPS Take by mouth.    [provider]  colchicine 0.6 MG tablet Take 0.6 mg by mouth daily.    [provider]  diclofenac (VOLTAREN) 75 MG EC tablet Take 1 tablet (75 mg total) by mouth 2 (two) times daily. 10/01/16   Edrick Kins, DPM  diphenhydrAMINE (BENADRYL) 25 mg capsule Take 25 mg by mouth every 6 (six) hours as needed.    [provider]  ergocalciferol (VITAMIN D2) 50000 units capsule Take 50,000 Units by mouth once a  week.    [provider]  etodolac (LODINE) 400 MG tablet Take 400 mg by mouth 2 (two) times daily.    [provider]  finasteride (PROSCAR) 5 MG tablet Take 5 mg by mouth daily.    [provider]  fluticasone (FLONASE) 50 MCG/ACT nasal spray Place into both nostrils daily.    [provider]  gabapentin (NEURONTIN) 300 MG capsule Take 300 mg by mouth 3 (three) times daily.    [provider]  Glucose Blood (PRECISION XTRA BLOOD GLUCOSE VI) by In Vitro route.    [provider]  insulin aspart (NOVOLOG) 100 UNIT/ML FlexPen Inject into the skin 3 (three) times daily with meals.    [provider]  insulin glargine (LANTUS) 100 UNIT/ML injection Inject  into the skin at bedtime.    [provider]  isosorbide mononitrate (IMDUR) 30 MG 24 hr tablet Take 30 mg by mouth daily.    [provider]  losartan (COZAAR) 50 MG tablet Take 50 mg by mouth daily.    [provider]  magnesium oxide (MAG-OX) 400 MG tablet Take 400 mg by mouth daily.    [provider]  Melatonin 5 MG TABS Take by mouth.    [provider]  meloxicam (MOBIC) 15 MG tablet Take 15 mg by mouth daily.    [provider]  metFORMIN (GLUCOPHAGE) 500 MG tablet Take by mouth 2 (two) times daily with a meal.    [provider]  naloxone (NARCAN) nasal spray 4 mg/0.1 mL Place 1 spray into the nose.    [provider]  oxybutynin (DITROPAN) 5 MG tablet Take 5 mg by mouth 3 (three) times daily.    [provider]  potassium chloride SA (K-DUR,KLOR-CON) 20 MEQ tablet Take 20 mEq by mouth 2 (two) times daily.    [provider]  PROCHLORPERAZINE MALEATE PO Take by mouth.    [provider]  QUEtiapine (SEROQUEL) 200 MG tablet Take 200 mg by mouth at bedtime.    [provider]  SILDENAFIL CITRATE PO Take by mouth.    [provider]  tamsulosin (FLOMAX) 0.4 MG CAPS capsule Take 0.4 mg by mouth.    [provider]  traMADol (ULTRAM) 50 MG tablet Take by mouth every 6 (six) hours as needed.    [provider]  venlafaxine (EFFEXOR) 75 MG tablet Take 75 mg by mouth 2 (two) times daily.    [provider]    Allergies Bee venom and Questran [cholestyramine]  Family History  Problem Relation Age of Onset  . Diabetes Brother   . Diabetes Sister   . Diabetes Father   . Hypertension Father   . Diabetes Mother   . Hypertension Mother   . Hypertension Sister   . Hypertension Brother     Social History Social History   Tobacco Use  . Smoking status: Former Smoker    Packs/day: 1.00    Years: 35.00    Pack years: 35.00    Last attempt to  quit: 01/25/2009    Years since quitting: 8.0  . Smokeless tobacco: Never Used  Substance Use Topics  . Alcohol use: No    Comment: former alcoholic  . Drug use: No    Comment: former cocaine abuse    Review of Systems Constitutional: No fever/chills Eyes: No visual changes. ENT: No sore throat. Cardiovascular: Denies chest pain. Respiratory: Denies shortness of breath. Gastrointestinal: No abdominal pain.  No nausea,  no vomiting.  No diarrhea.   Genitourinary: Negative for dysuria. Musculoskeletal: Chronic back/leg pain. Skin: Negative for rash. Neurological: Positive for slowed speech.  ____________________________________________   PHYSICAL EXAM:  VITAL SIGNS: ED Triage Vitals  Enc Vitals Group     BP 01/24/17 1802 (!) 100/57     Pulse Rate 01/24/17 1802 61     Resp 01/24/17 1802 16     Temp 01/24/17 1802 97.9 F (36.6 C)     Temp Source 01/24/17 1802 Oral     SpO2 01/24/17 1802 96 %     Weight 01/24/17 1802 271 lb (122.9 kg)     Height 01/24/17 1802 6\' 3"  (1.905 m)     Head Circumference --      Peak Flow --      Pain Score 01/24/17 1801 8   Constitutional: Alert and oriented. Well appearing and in no distress. Eyes: Conjunctivae are normal.  ENT   Head: Normocephalic and atraumatic.   Nose: No congestion/rhinnorhea.   Mouth/Throat: Mucous membranes are moist.   Neck: No stridor. Hematological/Lymphatic/Immunilogical: No cervical lymphadenopathy. Cardiovascular: Normal rate, regular rhythm.  No murmurs, rubs, or gallops.  Respiratory: Normal respiratory effort without tachypnea nor retractions. Breath sounds are clear and equal bilaterally. No wheezes/rales/rhonchi. Gastrointestinal: Soft and non tender. No rebound. No guarding.  Genitourinary: Deferred Musculoskeletal: Normal range of motion in all extremities. No lower extremity edema. Neurologic:  Slowed speech. Sensation intact. Moves all extremities  Skin:  Skin is warm, dry and intact. No  rash noted. Psychiatric: Mood and affect are normal. Speech and behavior are normal. Patient exhibits appropriate insight and judgment.  ____________________________________________    LABS (pertinent positives/negatives)  CBC wnl except plt 142 BMP k 3.3, glu 173, cr 3.76  ____________________________________________   EKG  I, Nance Pear, attending physician, personally viewed and interpreted this EKG  EKG Time: 1801 Rate: 68 Rhythm: normal sinus rhythm Axis: normal Intervals: qtc 512 QRS: RBBB ST changes: no st elevation Impression: abnormal ekg  ____________________________________________    RADIOLOGY  CT head/cervical spine No acute abnormality  ____________________________________________   PROCEDURES  Procedures  ____________________________________________   INITIAL IMPRESSION / ASSESSMENT AND PLAN / ED COURSE  Pertinent labs & imaging results that were available during my care of the patient were reviewed by me and considered in my medical decision making (see chart for details).  Patient presented to the emergency department today after having 2 syncopal episodes.  The patient also has some slurred speech of unclear etiology.  Differential is broad including cardiac etiology, anemia, electrolyte abnormality.  Blood work was significant for elevated creatinine.  Looking at care everywhere it appears his creatinine was within normal range 2 months ago.  Given some concern for acute kidney injury will plan on admission to the hospital service.  Does appear patient is on NSAIDs I do wonder if this could be the etiology.  Will start IV fluids here in the emergency department.  Secretary did try to contact the [place] but they are on diversion.  Discussed findings and plan with patient.   ____________________________________________   FINAL CLINICAL IMPRESSION(S) / ED DIAGNOSES  Final diagnoses:  Syncope, unspecified syncope type  AKI (acute kidney  injury) ([place])  Difficulty with speech     Note: This dictation was prepared with Dragon dictation. Any transcriptional errors that result from this process are unintentional     Nance Pear, MD 01/24/17 2009

## 2017-01-24 NOTE — H&P (Signed)
[facility] NAME: [name]    MR#:  [id number]  DATE OF BIRTH:  [birth year]  DATE OF ADMISSION:  01/24/2017  PRIMARY CARE PHYSICIAN: [facility]   REQUESTING/REFERRING PHYSICIAN:   CHIEF COMPLAINT:   Chief Complaint  Patient presents with  . Loss of Consciousness    HISTORY OF PRESENT ILLNESS: [name]  is a 56 y.o. male with a known history per below presenting with near syncope to 3 times today, associated with generalized weakness/fatigue, patient stated that he did not lose consciousness, was lightheaded/dizzy all day, in the emergency room patient was found to have blood pressure 89/59, noted acute kidney injury with creatinine 3.7-in November 2018 was 2.1, in January 2016 creatinine was normal at 0.8, CT head/C-spine negative for any acute process, EKG noted for T wave inversions diffusely, patient evaluated the bedside in the emergency room, son is at the bedside, patient in no apparent distress, had ? Slurred speech but thought to be related to dehydration, patient admits to not drinking much water on a daily basis-only 2 cups typically, patient is now being admitted with acute kidney injury, dehydration with associated near syncope  PAST MEDICAL HISTORY:   Past Medical History:  Diagnosis Date  . Cancer ([place])   . Chronic back pain   . Chronic neck pain   . Cirrhosis of liver ([place])   . Complicated migraine 2/54/2706  . Coronary artery disease   . Depression   . Gallstones   . Headache(784.0)   . Hepatitis C   . Hypercholesteremia   . Hypertension   . Left leg paresthesias   . Myocardial infarction ([place])   . Stroke [facility])    TIA  . Vertigo     PAST SURGICAL HISTORY:  Past Surgical History:  Procedure Laterality Date  . BYPASS GRAFT     triple bypass surgery  . CARDIAC SURGERY    . CORONARY ARTERY BYPASS GRAFT    . HAND SURGERY  1984  . ORTHOPEDIC SURGERY     "on my legs"     SOCIAL HISTORY:  Social History   Tobacco Use  . Smoking status: Former Smoker    Packs/day: 1.00    Years: 35.00    Pack years: 35.00    Last attempt to quit: 01/25/2009    Years since quitting: 8.0  . Smokeless tobacco: Never Used  Substance Use Topics  . Alcohol use: No    Comment: former alcoholic    FAMILY HISTORY:  Family History  Problem Relation Age of Onset  . Diabetes Brother   . Diabetes Sister   . Diabetes Father   . Hypertension Father   . Diabetes Mother   . Hypertension Mother   . Hypertension Sister   . Hypertension Brother     DRUG ALLERGIES:  Allergies  Allergen Reactions  . Bee Venom Anaphylaxis and Swelling  . Questran [Cholestyramine]     REVIEW OF SYSTEMS:   CONSTITUTIONAL: No fever, +fatigue/ weakness.  EYES: No blurred or double vision.  EARS, NOSE, AND THROAT: No tinnitus or ear pain.  RESPIRATORY: No cough, shortness of breath, wheezing or hemoptysis.  CARDIOVASCULAR: No chest pain, orthopnea, edema.  GASTROINTESTINAL: No nausea, vomiting, diarrhea or abdominal pain.  GENITOURINARY: No dysuria, hematuria.  ENDOCRINE: No polyuria, nocturia,  HEMATOLOGY: No anemia, easy bruising or bleeding SKIN: No rash or lesion. MUSCULOSKELETAL: No joint pain or arthritis.   NEUROLOGIC: No tingling, numbness, generalized weakness.  Lightheadedness, dizziness  pSYCHIATRY: No anxiety or depression.   MEDICATIONS AT HOME:  Prior to Admission medications   Medication Sig Start Date End Date Taking? Authorizing Provider  amLODipine (NORVASC) 10 MG tablet Take 5 mg by mouth daily.    [provider]  aspirin 81 MG chewable tablet Chew 81 mg by mouth every morning.     [provider]  atorvastatin (LIPITOR) 20 MG tablet Take 20 mg by mouth daily.    [provider]  carvedilol (COREG) 25 MG tablet Take 25 mg by mouth 2 (two) times daily with a meal.    [provider]  cetirizine (ZYRTEC) 10 MG tablet Take 10 mg by  mouth daily.    [provider]  Cholecalciferol (VITAMIN D3) 1000 units CAPS Take by mouth.    [provider]  colchicine 0.6 MG tablet Take 0.6 mg by mouth daily.    [provider]  diclofenac (VOLTAREN) 75 MG EC tablet Take 1 tablet (75 mg total) by mouth 2 (two) times daily. 10/01/16   Edrick Kins, DPM  diphenhydrAMINE (BENADRYL) 25 mg capsule Take 25 mg by mouth every 6 (six) hours as needed.    [provider]  ergocalciferol (VITAMIN D2) 50000 units capsule Take 50,000 Units by mouth once a week.    [provider]  etodolac (LODINE) 400 MG tablet Take 400 mg by mouth 2 (two) times daily.    [provider]  finasteride (PROSCAR) 5 MG tablet Take 5 mg by mouth daily.    [provider]  fluticasone (FLONASE) 50 MCG/ACT nasal spray Place into both nostrils daily.    [provider]  gabapentin (NEURONTIN) 300 MG capsule Take 300 mg by mouth 3 (three) times daily.    [provider]  Glucose Blood (PRECISION XTRA BLOOD GLUCOSE VI) by In Vitro route.    [provider]  insulin aspart (NOVOLOG) 100 UNIT/ML FlexPen Inject into the skin 3 (three) times daily with meals.    [provider]  insulin glargine (LANTUS) 100 UNIT/ML injection Inject into the skin at bedtime.    [provider]  isosorbide mononitrate (IMDUR) 30 MG 24 hr tablet Take 30 mg by mouth daily.    [provider]  losartan (COZAAR) 50 MG tablet Take 50 mg by mouth daily.    [provider]  magnesium oxide (MAG-OX) 400 MG tablet Take 400 mg by mouth daily.    [provider]  Melatonin 5 MG TABS Take by mouth.    [provider]  meloxicam (MOBIC) 15 MG tablet Take 15 mg by mouth daily.    [provider]  metFORMIN (GLUCOPHAGE) 500 MG tablet Take by mouth 2 (two) times daily with a meal.    [provider]  naloxone (NARCAN) nasal spray 4 mg/0.1 mL Place 1 spray  into the nose.    [provider]  oxybutynin (DITROPAN) 5 MG tablet Take 5 mg by mouth 3 (three) times daily.    [provider]  potassium chloride SA (K-DUR,KLOR-CON) 20 MEQ tablet Take 20 mEq by mouth 2 (two) times daily.    [provider]  PROCHLORPERAZINE MALEATE PO Take by mouth.    [provider]  QUEtiapine (SEROQUEL) 200 MG tablet Take 200 mg by mouth at bedtime.    [provider]  SILDENAFIL CITRATE PO Take by mouth.    [provider]  tamsulosin (FLOMAX) 0.4 MG CAPS capsule Take 0.4  mg by mouth.    [provider]  traMADol (ULTRAM) 50 MG tablet Take by mouth every 6 (six) hours as needed.    [provider]  venlafaxine (EFFEXOR) 75 MG tablet Take 75 mg by mouth 2 (two) times daily.    [provider]      PHYSICAL EXAMINATION:   VITAL SIGNS: Blood pressure 99/72, pulse 61, temperature 97.9 F (36.6 C), temperature source Oral, resp. rate 19, height 6\' 3"  (1.905 m), weight 122.9 kg (271 lb), SpO2 96 %.  GENERAL:  56 y.o.-year-old patient lying in the bed with no acute distress.  Morbid obesity, nontoxic-appearing EYES: Pupils equal, round, reactive to light and accommodation. No scleral icterus. Extraocular muscles intact.  HEENT: Head atraumatic, normocephalic. Oropharynx and nasopharynx clear.  NECK:  Supple, no jugular venous distention. No thyroid enlargement, no tenderness.  LUNGS: Normal breath sounds bilaterally, no wheezing, rales,rhonchi or crepitation. No use of accessory muscles of respiration.  CARDIOVASCULAR: S1, S2 normal. No murmurs, rubs, or gallops.  ABDOMEN: Soft, nontender, nondistended. Bowel sounds present. No organomegaly or mass.  EXTREMITIES: No pedal edema, cyanosis, or clubbing.  NEUROLOGIC: Cranial nerves II through XII are intact. MAES -slow mentation/mild left hemiparesis from remote CVA. Gait not checked.  PSYCHIATRIC: The patient is alert and oriented x 3.  SKIN:  No obvious rash, lesion, or ulcer.   LABORATORY PANEL:   CBC Recent Labs  Lab 01/24/17 1803  WBC 7.8  HGB 13.7  HCT 41.0  PLT 142*  MCV 86.7  MCH 29.0  MCHC 33.5  RDW 13.2   ------------------------------------------------------------------------------------------------------------------  Chemistries  Recent Labs  Lab 01/24/17 1803  NA 135  K 3.3*  CL 97*  CO2 28  GLUCOSE 173*  BUN 44*  CREATININE 3.76*  CALCIUM 9.6   ------------------------------------------------------------------------------------------------------------------ estimated creatinine clearance is 30.3 mL/min (A) (by C-G formula based on SCr of 3.76 mg/dL (H)). ------------------------------------------------------------------------------------------------------------------ No results for input(s): TSH, T4TOTAL, T3FREE, THYROIDAB in the last 72 hours.  Invalid input(s): FREET3   Coagulation profile No results for input(s): INR, PROTIME in the last 168 hours. ------------------------------------------------------------------------------------------------------------------- No results for input(s): DDIMER in the last 72 hours. -------------------------------------------------------------------------------------------------------------------  Cardiac Enzymes No results for input(s): CKMB, TROPONINI, MYOGLOBIN in the last 168 hours.  Invalid input(s): CK ------------------------------------------------------------------------------------------------------------------ Invalid input(s): POCBNP  ---------------------------------------------------------------------------------------------------------------  Urinalysis    Component Value Date/Time   COLORURINE YELLOW 10/11/2012 [place] 10/11/2012 0443   LABSPEC 1.008 10/11/2012 0443   PHURINE 6.5 10/11/2012 0443   GLUCOSEU NEGATIVE 10/11/2012 0443   HGBUR NEGATIVE 10/11/2012 0443   BILIRUBINUR NEGATIVE 10/11/2012 0443    KETONESUR NEGATIVE 10/11/2012 0443   PROTEINUR NEGATIVE 10/11/2012 0443   UROBILINOGEN 0.2 10/11/2012 0443   NITRITE NEGATIVE 10/11/2012 0443   LEUKOCYTESUR NEGATIVE 10/11/2012 0443     RADIOLOGY: Ct Head Wo Contrast  Result Date: 01/24/2017 CLINICAL DATA:  Syncopal episode with weakness in both legs today. EXAM: CT HEAD WITHOUT CONTRAST CT CERVICAL SPINE WITHOUT CONTRAST TECHNIQUE: Multidetector CT imaging of the head and cervical spine was performed following the standard protocol without intravenous contrast. Multiplanar CT image reconstructions of the cervical spine were also generated. COMPARISON:  10/11/2012 head CT FINDINGS: CT HEAD FINDINGS BRAIN: The ventricles and sulci are normal. Chronic small vessel ischemic disease of periventricular white matter. No intraparenchymal hemorrhage, mass effect nor midline shift. No acute large vascular territory infarcts. No abnormal extra-axial fluid collections. Basal cisterns are midline and not effaced. No acute cerebellar abnormality. VASCULAR: Moderate atherosclerosis noted  at the skullbase. No hyperdense vessels. SKULL/SOFT TISSUES: No skull fracture. No significant soft tissue swelling. ORBITS/SINUSES: The included ocular globes and orbital contents are normal.The mastoid air-cells and included paranasal sinuses are well-aerated. OTHER: None. CT CERVICAL SPINE FINDINGS ALIGNMENT: Vertebral bodies in alignment. Straightening of cervical lordosis. This can be seen with muscle spasm or patient positioning. SKULL BASE AND VERTEBRAE: Cervical vertebral bodies and posterior elements are intact. Intervertebral disc heights preserved. No destructive bony lesions. C1-2 articulation maintained. 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 These contribute to adjacent mild neural foraminal encroachment though more so at C6-7. SOFT TISSUES AND SPINAL CANAL: Nuchal ligament ossification posterior to C5. DISC  LEVELS: No significant osseous canal stenosis or neural foraminal narrowing moderate disc space narrowing C3 through C7 with slight grade 1 retrolisthesis of C6 on C7. UPPER CHEST: Lung apices are clear. OTHER: Atherosclerotic calcifications of the extracranial carotids. IMPRESSION: 1. No acute intracranial abnormality. 2. 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 3. Cervical spondylosis without acute cervical spine fracture or posttraumatic listhesis. Electronically Signed   By: Ashley Royalty M.D.   On: 01/24/2017 18:42   Ct Cervical Spine Wo Contrast  Result Date: 01/24/2017 CLINICAL DATA:  Syncopal episode with weakness in both legs today. EXAM: CT HEAD WITHOUT CONTRAST CT CERVICAL SPINE WITHOUT CONTRAST TECHNIQUE: Multidetector CT imaging of the head and cervical spine was performed following the standard protocol without intravenous contrast. Multiplanar CT image reconstructions of the cervical spine were also generated. COMPARISON:  10/11/2012 head CT FINDINGS: CT HEAD FINDINGS BRAIN: The ventricles and sulci are normal. Chronic small vessel ischemic disease of periventricular white matter. No intraparenchymal hemorrhage, mass effect nor midline shift. No acute large vascular territory infarcts. No abnormal extra-axial fluid collections. Basal cisterns are midline and not effaced. No acute cerebellar abnormality. VASCULAR: Moderate atherosclerosis noted at the skullbase. No hyperdense vessels. SKULL/SOFT TISSUES: No skull fracture. No significant soft tissue swelling. ORBITS/SINUSES: The included ocular globes and orbital contents are normal.The mastoid air-cells and included paranasal sinuses are well-aerated. OTHER: None. CT CERVICAL SPINE FINDINGS ALIGNMENT: Vertebral bodies in alignment. Straightening of cervical lordosis. This can be seen with muscle spasm or patient positioning. SKULL BASE AND VERTEBRAE: Cervical vertebral bodies and posterior elements are intact.  Intervertebral disc heights preserved. No destructive bony lesions. C1-2 articulation maintained. 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 These contribute to adjacent mild neural foraminal encroachment though more so at C6-7. SOFT TISSUES AND SPINAL CANAL: Nuchal ligament ossification posterior to C5. DISC LEVELS: No significant osseous canal stenosis or neural foraminal narrowing moderate disc space narrowing C3 through C7 with slight grade 1 retrolisthesis of C6 on C7. UPPER CHEST: Lung apices are clear. OTHER: Atherosclerotic calcifications of the extracranial carotids. IMPRESSION: 1. No acute intracranial abnormality. 2. 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 3. Cervical spondylosis without acute cervical spine fracture or posttraumatic listhesis. Electronically Signed   By: Ashley Royalty M.D.   On: 01/24/2017 18:42    EKG: Orders placed or performed during the hospital encounter of 01/24/17  . ED EKG  . ED EKG  . EKG 12-Lead  . EKG 12-Lead    IMPRESSION AND PLAN: 1 acute near syncope Most likely secondary to acute dehydration, poor water intake Admit to regular nursing floor bed, IV fluids for rehydration, check orthostatics in the morning, ambulate in halls with assistance to assess for possible discharge on  tomorrow, rule out acute coronary syndrome with cardiac enzymes x3 sets, encourage p.o. fluid intake  2 AKI Creatinine 2.1 in November 2018, 0.8 in January 2016-currently 3.7 Most likely secondary to poor p.o. Intake IV fluids for rehydration, check renal ultrasound, avoid nephrotoxic agents, strict I&O monitoring, daily weights, check BMP in the morning, review/complete MAR when available  3 acute hypokalemia/hypochloremia Replete with IV fluids/p.o. potassium, check magnesium and BMP in the morning  4 incomplete MAR Complete when available  5 chronic benign essential hypertension Currently  hypotensive Review/complete MAR when available, hold antihypertensives for now, as needed hydralazine for systolic blood pressure greater than 160, vitals per routine, make changes as per necessary  6 chronic diabetes mellitus type 2 Sliding scale insulin with Accu-Cheks per routine for now  Full code Condition stable Prognosis fair DVT prophylaxis with heparin subcu Disposition Home in 1-2 days barring any complications    All the records are reviewed and case discussed with ED provider. Management plans discussed with the patient, family and they are in agreement.  CODE STATUS: Code Status History    Date Active Date Inactive Code Status Order ID Comments User Context   10/11/2012 02:37 10/13/2012 21:16 Full Code [id number]  Bonnielee Haff, MD Inpatient   06/20/2011 07:54 06/20/2011 17:59 Full Code [id number]  Doree Albee, MD ED       TOTAL TIME TAKING CARE OF THIS PATIENT: 45 minutes.    Avel Peace Rilley Stash M.D on 01/24/2017   Between 7am to 6pm - Pager - [phone]  After 6pm go to [email or web address] - password [email or web address] [facility]  Office  [phone]  CC: Primary care physician; [place]   Note: This dictation was prepared with Dragon dictation along with smaller phrase technology. Any transcriptional errors that result from this process are unintentional.

## 2017-01-24 NOTE — ED Triage Notes (Addendum)
Pt here after syncopal episode in parking lot, reports feeling weak in bilateral legs today. Pt denies any other symptoms, denies V/D. Pt's speech is slurred, pt reports speech is normally weird, but today is different. Pt reports LKW was 0100 today.

## 2017-01-25 ENCOUNTER — Observation Stay: Payer: Non-veteran care

## 2017-01-25 DIAGNOSIS — Z794 Long term (current) use of insulin: Secondary | ICD-10-CM | POA: Diagnosis not present

## 2017-01-25 DIAGNOSIS — R55 Syncope and collapse: Secondary | ICD-10-CM | POA: Diagnosis present

## 2017-01-25 DIAGNOSIS — N179 Acute kidney failure, unspecified: Secondary | ICD-10-CM | POA: Diagnosis not present

## 2017-01-25 DIAGNOSIS — E7800 Pure hypercholesterolemia, unspecified: Secondary | ICD-10-CM | POA: Diagnosis not present

## 2017-01-25 DIAGNOSIS — Z79899 Other long term (current) drug therapy: Secondary | ICD-10-CM | POA: Diagnosis not present

## 2017-01-25 DIAGNOSIS — K921 Melena: Secondary | ICD-10-CM | POA: Diagnosis not present

## 2017-01-25 DIAGNOSIS — I2510 Atherosclerotic heart disease of native coronary artery without angina pectoris: Secondary | ICD-10-CM | POA: Diagnosis not present

## 2017-01-25 DIAGNOSIS — I252 Old myocardial infarction: Secondary | ICD-10-CM | POA: Diagnosis not present

## 2017-01-25 DIAGNOSIS — E119 Type 2 diabetes mellitus without complications: Secondary | ICD-10-CM | POA: Diagnosis not present

## 2017-01-25 DIAGNOSIS — G8929 Other chronic pain: Secondary | ICD-10-CM | POA: Diagnosis not present

## 2017-01-25 DIAGNOSIS — B1920 Unspecified viral hepatitis C without hepatic coma: Secondary | ICD-10-CM | POA: Diagnosis not present

## 2017-01-25 DIAGNOSIS — Z8673 Personal history of transient ischemic attack (TIA), and cerebral infarction without residual deficits: Secondary | ICD-10-CM | POA: Diagnosis not present

## 2017-01-25 DIAGNOSIS — I959 Hypotension, unspecified: Secondary | ICD-10-CM | POA: Diagnosis not present

## 2017-01-25 DIAGNOSIS — Z7982 Long term (current) use of aspirin: Secondary | ICD-10-CM | POA: Diagnosis not present

## 2017-01-25 DIAGNOSIS — K7460 Unspecified cirrhosis of liver: Secondary | ICD-10-CM | POA: Diagnosis not present

## 2017-01-25 DIAGNOSIS — E878 Other disorders of electrolyte and fluid balance, not elsewhere classified: Secondary | ICD-10-CM | POA: Diagnosis not present

## 2017-01-25 DIAGNOSIS — R479 Unspecified speech disturbances: Secondary | ICD-10-CM | POA: Diagnosis present

## 2017-01-25 DIAGNOSIS — E876 Hypokalemia: Secondary | ICD-10-CM | POA: Diagnosis not present

## 2017-01-25 DIAGNOSIS — Z23 Encounter for immunization: Secondary | ICD-10-CM | POA: Diagnosis not present

## 2017-01-25 DIAGNOSIS — F329 Major depressive disorder, single episode, unspecified: Secondary | ICD-10-CM | POA: Diagnosis not present

## 2017-01-25 DIAGNOSIS — I10 Essential (primary) hypertension: Secondary | ICD-10-CM | POA: Diagnosis not present

## 2017-01-25 DIAGNOSIS — Z87891 Personal history of nicotine dependence: Secondary | ICD-10-CM | POA: Diagnosis not present

## 2017-01-25 LAB — GLUCOSE, CAPILLARY
GLUCOSE-CAPILLARY: 257 mg/dL — AB (ref 65–99)
GLUCOSE-CAPILLARY: 281 mg/dL — AB (ref 65–99)
Glucose-Capillary: 249 mg/dL — ABNORMAL HIGH (ref 65–99)
Glucose-Capillary: 329 mg/dL — ABNORMAL HIGH (ref 65–99)

## 2017-01-25 LAB — URINALYSIS, COMPLETE (UACMP) WITH MICROSCOPIC
BACTERIA UA: NONE SEEN
Glucose, UA: NEGATIVE mg/dL
Hgb urine dipstick: NEGATIVE
KETONES UR: NEGATIVE mg/dL
Leukocytes, UA: NEGATIVE
Nitrite: NEGATIVE
PROTEIN: NEGATIVE mg/dL
Specific Gravity, Urine: 1.019 (ref 1.005–1.030)
pH: 5 (ref 5.0–8.0)

## 2017-01-25 LAB — BASIC METABOLIC PANEL
Anion gap: 9 (ref 5–15)
BUN: 41 mg/dL — ABNORMAL HIGH (ref 6–20)
CO2: 26 mmol/L (ref 22–32)
Calcium: 8.8 mg/dL — ABNORMAL LOW (ref 8.9–10.3)
Chloride: 102 mmol/L (ref 101–111)
Creatinine, Ser: 2.69 mg/dL — ABNORMAL HIGH (ref 0.61–1.24)
GFR calc Af Amer: 28 mL/min — ABNORMAL LOW (ref >60)
GFR, EST NON AFRICAN AMERICAN: 24 mL/min — AB (ref >60)
GLUCOSE: 264 mg/dL — AB (ref 65–99)
POTASSIUM: 3.4 mmol/L — AB (ref 3.5–5.1)
Sodium: 137 mmol/L (ref 135–145)

## 2017-01-25 LAB — AMMONIA: Ammonia: 66 umol/L — ABNORMAL HIGH (ref 9–35)

## 2017-01-25 MED ORDER — GUAIFENESIN ER 600 MG PO TB12
600.0000 mg | ORAL_TABLET | Freq: Two times a day (BID) | ORAL | Status: DC | PRN
  Administered 2017-01-25: 17:00:00 600 mg via ORAL
  Filled 2017-01-25: qty 1

## 2017-01-25 NOTE — Progress Notes (Signed)
[facility] NAME: [name]    MR#:  [id number]  DATE OF BIRTH:  1960/03/04  SUBJECTIVE: He says he feels better today.  admitted for syncope, dehydration with acute kidney injury.  Has been having some cold symptoms and not drinking enough.  CHIEF COMPLAINT:   Chief Complaint  Patient presents with  . Loss of Consciousness    REVIEW OF SYSTEMS:   ROS CONSTITUTIONAL: No fever, fatigue or weakness.  EYES: No blurred or double vision.  EARS, NOSE, AND THROAT: No tinnitus or ear pain.  RESPIRATORY: No cough, shortness of breath, wheezing or hemoptysis.  Hoarseness of voice. CARDIOVASCULAR: No chest pain, orthopnea, edema.  GASTROINTESTINAL: No nausea, vomiting, diarrhea or abdominal pain.  GENITOURINARY: No dysuria, hematuria.  ENDOCRINE: No polyuria, nocturia,  HEMATOLOGY: No anemia, easy bruising or bleeding SKIN: No rash or lesion. MUSCULOSKELETAL: No joint pain or arthritis.   NEUROLOGIC: No tingling, numbness, weakness.  PSYCHIATRY: No anxiety or depression.   DRUG ALLERGIES:   Allergies  Allergen Reactions  . Bee Venom Anaphylaxis and Swelling  . Questran [Cholestyramine]     VITALS:  Blood pressure 140/86, pulse 63, temperature 97.6 F (36.4 C), temperature source Oral, resp. rate 18, height 6\' 3"  (1.905 m), weight 118.6 kg (261 lb 8 oz), SpO2 96 %.  PHYSICAL EXAMINATION:  GENERAL:  57 y.o.-year-old patient lying in the bed with no acute distress.  EYES: Pupils equal, round, reactive to light and accommodation. No scleral icterus. Extraocular muscles intact.  HEENT: Head atraumatic, normocephalic. Oropharynx and nasopharynx clear.  NECK:  Supple, no jugular venous distention. No thyroid enlargement, no tenderness.  LUNGS: Normal breath sounds bilaterally, no wheezing, rales,rhonchi or crepitation. No use of accessory muscles of respiration.  CARDIOVASCULAR: S1, S2 normal. No murmurs, rubs, or  gallops.  ABDOMEN: Soft, nontender, nondistended. Bowel sounds present. No organomegaly or mass.  EXTREMITIES: No pedal edema, cyanosis, or clubbing.  NEUROLOGIC: Cranial nerves II through XII are intact. Muscle strength 5/5 in all extremities. Sensation intact. Gait not checked.  PSYCHIATRIC: The patient is alert and oriented x 3.  SKIN: No obvious rash, lesion, or ulcer.    LABORATORY PANEL:   CBC Recent Labs  Lab 01/24/17 1803  WBC 7.8  HGB 13.7  HCT 41.0  PLT 142*   ------------------------------------------------------------------------------------------------------------------  Chemistries  Recent Labs  Lab 01/25/17 0501  NA 137  K 3.4*  CL 102  CO2 26  GLUCOSE 264*  BUN 41*  CREATININE 2.69*  CALCIUM 8.8*   ------------------------------------------------------------------------------------------------------------------  Cardiac Enzymes No results for input(s): TROPONINI in the last 168 hours. ------------------------------------------------------------------------------------------------------------------  RADIOLOGY:  Ct Head Wo Contrast  Result Date: 01/24/2017 CLINICAL DATA:  Syncopal episode with weakness in both legs today. EXAM: CT HEAD WITHOUT CONTRAST CT CERVICAL SPINE WITHOUT CONTRAST TECHNIQUE: Multidetector CT imaging of the head and cervical spine was performed following the standard protocol without intravenous contrast. Multiplanar CT image reconstructions of the cervical spine were also generated. COMPARISON:  10/11/2012 head CT FINDINGS: CT HEAD FINDINGS BRAIN: The ventricles and sulci are normal. Chronic small vessel ischemic disease of periventricular white matter. No intraparenchymal hemorrhage, mass effect nor midline shift. No acute large vascular territory infarcts. No abnormal extra-axial fluid collections. Basal cisterns are midline and not effaced. No acute cerebellar abnormality. VASCULAR: Moderate atherosclerosis noted at the skullbase. No  hyperdense vessels. SKULL/SOFT TISSUES: No skull fracture. No significant soft tissue swelling. ORBITS/SINUSES: The included ocular globes and orbital contents are  normal.The mastoid air-cells and included paranasal sinuses are well-aerated. OTHER: None. CT CERVICAL SPINE FINDINGS ALIGNMENT: Vertebral bodies in alignment. Straightening of cervical lordosis. This can be seen with muscle spasm or patient positioning. SKULL BASE AND VERTEBRAE: Cervical vertebral bodies and posterior elements are intact. Intervertebral disc heights preserved. No destructive bony lesions. C1-2 articulation maintained. 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 These contribute to adjacent mild neural foraminal encroachment though more so at C6-7. SOFT TISSUES AND SPINAL CANAL: Nuchal ligament ossification posterior to C5. DISC LEVELS: No significant osseous canal stenosis or neural foraminal narrowing moderate disc space narrowing C3 through C7 with slight grade 1 retrolisthesis of C6 on C7. UPPER CHEST: Lung apices are clear. OTHER: Atherosclerotic calcifications of the extracranial carotids. IMPRESSION: 1. No acute intracranial abnormality. 2. 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 3. Cervical spondylosis without acute cervical spine fracture or posttraumatic listhesis. Electronically Signed   By: Ashley Royalty M.D.   On: 01/24/2017 18:42   Ct Cervical Spine Wo Contrast  Result Date: 01/24/2017 CLINICAL DATA:  Syncopal episode with weakness in both legs today. EXAM: CT HEAD WITHOUT CONTRAST CT CERVICAL SPINE WITHOUT CONTRAST TECHNIQUE: Multidetector CT imaging of the head and cervical spine was performed following the standard protocol without intravenous contrast. Multiplanar CT image reconstructions of the cervical spine were also generated. COMPARISON:  10/11/2012 head CT FINDINGS: CT HEAD FINDINGS BRAIN: The ventricles and sulci are normal.  Chronic small vessel ischemic disease of periventricular white matter. No intraparenchymal hemorrhage, mass effect nor midline shift. No acute large vascular territory infarcts. No abnormal extra-axial fluid collections. Basal cisterns are midline and not effaced. No acute cerebellar abnormality. VASCULAR: Moderate atherosclerosis noted at the skullbase. No hyperdense vessels. SKULL/SOFT TISSUES: No skull fracture. No significant soft tissue swelling. ORBITS/SINUSES: The included ocular globes and orbital contents are normal.The mastoid air-cells and included paranasal sinuses are well-aerated. OTHER: None. CT CERVICAL SPINE FINDINGS ALIGNMENT: Vertebral bodies in alignment. Straightening of cervical lordosis. This can be seen with muscle spasm or patient positioning. SKULL BASE AND VERTEBRAE: Cervical vertebral bodies and posterior elements are intact. Intervertebral disc heights preserved. No destructive bony lesions. C1-2 articulation maintained. 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 These contribute to adjacent mild neural foraminal encroachment though more so at C6-7. SOFT TISSUES AND SPINAL CANAL: Nuchal ligament ossification posterior to C5. DISC LEVELS: No significant osseous canal stenosis or neural foraminal narrowing moderate disc space narrowing C3 through C7 with slight grade 1 retrolisthesis of C6 on C7. UPPER CHEST: Lung apices are clear. OTHER: Atherosclerotic calcifications of the extracranial carotids. IMPRESSION: 1. No acute intracranial abnormality. 2. 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 3. Cervical spondylosis without acute cervical spine fracture or posttraumatic listhesis. Electronically Signed   By: Ashley Royalty M.D.   On: 01/24/2017 18:42   US Renal  Result Date: 01/25/2017 CLINICAL DATA:  Acute kidney injury EXAM: RENAL / URINARY TRACT ULTRASOUND COMPLETE COMPARISON:  10/11/2012 FINDINGS: Right  Kidney: Length: 12.2 cm. Echogenicity within normal limits. No mass or hydronephrosis visualized. Left Kidney: Length: 13.2 cm. Echogenicity within normal limits. No mass or hydronephrosis visualized. Bladder: Appears normal for degree of bladder distention. IMPRESSION: Within normal limits.  No evidence of hydronephrosis. Electronically Signed   By: Marybelle Killings M.D.   On: 01/25/2017 09:48    EKG:   Orders placed or performed during the hospital encounter of 01/24/17  .  ED EKG  . ED EKG  . EKG 12-Lead  . EKG 12-Lead  EKG showed T wave inversions in V3 ,V4 ,V5, V6, not essentially changed from nov May 18, 2016.except more pronounced T wave inversions in lead V5 than before.  Patient has no chest pain.  He feels well today.  ASSESSMENT AND PLAN:  #83.57 year old male patient with syncope likely due to dehydration.  Now he feels better.   2.  Acute kidney injury: Likely prerenal with hypotension: Improving with IV fluids, renal ultrasound did not show any obstruction.  Continue IV fluids today, likely discharge home tomorrow. 3 EKG changes without chest pain: We will order echocardiogram.  Previous echo done in 05-18-2012 showed EF 60%.  #4 hypotension: Improved.   #5. hypokalemia, replaced potassium, improving.   6.  Diabetes mellitus type 2: Continue sliding scale insulin with coverage,    All the records are reviewed and case discussed with Care Management/Social Workerr. Management plans discussed with the patient, family and they are in agreement.  CODE STATUS: full  TOTAL TIME TAKING CARE OF THIS PATIENT: 35 minutes.   POSSIBLE D/C IN 1-2 DAYS, DEPENDING ON CLINICAL CONDITION.   Epifanio Lesches M.D on 01/25/2017 at 10:05 AM  Between 7am to 6pm - Pager - [phone]  After 6pm go to [email or web address] - password [email or web address]  Office  [phone]  CC: Primary care physician; [place]   Note: This dictation was prepared with Dragon  dictation along with smaller phrase technology. Any transcriptional errors that result from this process are unintentional.

## 2017-01-26 DIAGNOSIS — K921 Melena: Secondary | ICD-10-CM | POA: Diagnosis not present

## 2017-01-26 DIAGNOSIS — N179 Acute kidney failure, unspecified: Secondary | ICD-10-CM | POA: Diagnosis not present

## 2017-01-26 LAB — BASIC METABOLIC PANEL
ANION GAP: 9 (ref 5–15)
BUN: 18 mg/dL (ref 6–20)
CHLORIDE: 105 mmol/L (ref 101–111)
CO2: 26 mmol/L (ref 22–32)
Calcium: 9.2 mg/dL (ref 8.9–10.3)
Creatinine, Ser: 1.04 mg/dL (ref 0.61–1.24)
GFR calc non Af Amer: >60 mL/min (ref >60)
GLUCOSE: 206 mg/dL — AB (ref 65–99)
POTASSIUM: 3.3 mmol/L — AB (ref 3.5–5.1)
Sodium: 140 mmol/L (ref 135–145)

## 2017-01-26 LAB — HEMOGLOBIN: Hemoglobin: 14.4 g/dL (ref 13.0–18.0)

## 2017-01-26 LAB — GLUCOSE, CAPILLARY
GLUCOSE-CAPILLARY: 236 mg/dL — AB (ref 65–99)
GLUCOSE-CAPILLARY: 342 mg/dL — AB (ref 65–99)

## 2017-01-26 LAB — MAGNESIUM: Magnesium: 1.4 mg/dL — ABNORMAL LOW (ref 1.7–2.4)

## 2017-01-26 LAB — PLATELET COUNT: PLATELETS: 118 10*3/uL — AB (ref 150–440)

## 2017-01-26 LAB — OCCULT BLOOD X 1 CARD TO LAB, STOOL: FECAL OCCULT BLD: NEGATIVE

## 2017-01-26 MED ORDER — QUETIAPINE FUMARATE 200 MG PO TABS
200.0000 mg | ORAL_TABLET | Freq: Every day | ORAL | Status: DC
  Filled 2017-01-26: qty 1

## 2017-01-26 MED ORDER — DIPHENHYDRAMINE HCL 25 MG PO CAPS: 25.0000 mg | ORAL_CAPSULE | Freq: Four times a day (QID) | ORAL | Status: DC | PRN

## 2017-01-26 MED ORDER — MAGNESIUM SULFATE 4 GM/100ML IV SOLN
4.0000 g | Freq: Once | INTRAVENOUS | Status: AC
  Administered 2017-01-26: 15:00:00 4 g via INTRAVENOUS
  Filled 2017-01-26 (×2): qty 100

## 2017-01-26 MED ORDER — FINASTERIDE 5 MG PO TABS
5.0000 mg | ORAL_TABLET | Freq: Every day | ORAL | Status: DC
  Administered 2017-01-26: 5 mg via ORAL
  Filled 2017-01-26: qty 1

## 2017-01-26 MED ORDER — HYDRALAZINE HCL 20 MG/ML IJ SOLN: 10.0000 mg | Freq: Four times a day (QID) | INTRAMUSCULAR | Status: DC | PRN

## 2017-01-26 MED ORDER — OXYBUTYNIN CHLORIDE 5 MG PO TABS
5.0000 mg | ORAL_TABLET | Freq: Three times a day (TID) | ORAL | Status: DC
  Administered 2017-01-26: 5 mg via ORAL
  Filled 2017-01-26: qty 1

## 2017-01-26 MED ORDER — AMLODIPINE BESYLATE 5 MG PO TABS
5.0000 mg | ORAL_TABLET | Freq: Every day | ORAL | Status: DC
  Administered 2017-01-26: 09:00:00 5 mg via ORAL
  Filled 2017-01-26: qty 1

## 2017-01-26 MED ORDER — INSULIN ASPART 100 UNIT/ML ~~LOC~~ SOLN
0.0000 [IU] | Freq: Three times a day (TID) | SUBCUTANEOUS | Status: DC
  Administered 2017-01-26: 12:00:00 11 [IU] via SUBCUTANEOUS
  Filled 2017-01-26: qty 1

## 2017-01-26 MED ORDER — ATORVASTATIN CALCIUM 20 MG PO TABS
20.0000 mg | ORAL_TABLET | Freq: Every day | ORAL | Status: DC
  Administered 2017-01-26: 09:00:00 20 mg via ORAL
  Filled 2017-01-26: qty 1

## 2017-01-26 MED ORDER — GABAPENTIN 300 MG PO CAPS
300.0000 mg | ORAL_CAPSULE | Freq: Three times a day (TID) | ORAL | Status: DC
  Administered 2017-01-26: 300 mg via ORAL
  Filled 2017-01-26: qty 1

## 2017-01-26 MED ORDER — CARVEDILOL 25 MG PO TABS
25.0000 mg | ORAL_TABLET | Freq: Two times a day (BID) | ORAL | Status: DC
  Administered 2017-01-26: 25 mg via ORAL
  Filled 2017-01-26: qty 1

## 2017-01-26 MED ORDER — ISOSORBIDE MONONITRATE ER 30 MG PO TB24
30.0000 mg | ORAL_TABLET | Freq: Every day | ORAL | Status: DC
  Administered 2017-01-26: 09:00:00 30 mg via ORAL
  Filled 2017-01-26: qty 1

## 2017-01-26 MED ORDER — INSULIN ASPART 100 UNIT/ML ~~LOC~~ SOLN
SUBCUTANEOUS | Status: AC
  Filled 2017-01-26: qty 1

## 2017-01-26 MED ORDER — INSULIN ASPART 100 UNIT/ML ~~LOC~~ SOLN
3.0000 [IU] | Freq: Three times a day (TID) | SUBCUTANEOUS | Status: DC
  Filled 2017-01-26 (×3): qty 0.03

## 2017-01-26 MED ORDER — POTASSIUM CHLORIDE CRYS ER 20 MEQ PO TBCR
40.0000 meq | EXTENDED_RELEASE_TABLET | Freq: Once | ORAL | Status: AC
  Administered 2017-01-26: 40 meq via ORAL
  Filled 2017-01-26: qty 2

## 2017-01-26 MED ORDER — PANTOPRAZOLE SODIUM 40 MG PO TBEC
40.0000 mg | DELAYED_RELEASE_TABLET | Freq: Two times a day (BID) | ORAL | Status: DC
  Administered 2017-01-26: 09:00:00 40 mg via ORAL
  Filled 2017-01-26: qty 1

## 2017-01-26 MED ORDER — FLUTICASONE PROPIONATE 50 MCG/ACT NA SUSP
1.0000 | Freq: Every day | NASAL | Status: DC
  Filled 2017-01-26: qty 16

## 2017-01-26 MED ORDER — TAMSULOSIN HCL 0.4 MG PO CAPS
0.4000 mg | ORAL_CAPSULE | Freq: Every day | ORAL | Status: DC
  Administered 2017-01-26: 0.4 mg via ORAL
  Filled 2017-01-26: qty 1

## 2017-01-26 MED ORDER — INSULIN ASPART 100 UNIT/ML ~~LOC~~ SOLN
3.0000 [IU] | Freq: Three times a day (TID) | SUBCUTANEOUS | Status: DC
  Administered 2017-01-26: 3 [IU] via SUBCUTANEOUS
  Filled 2017-01-26: qty 1

## 2017-01-26 MED ORDER — INSULIN ASPART 100 UNIT/ML ~~LOC~~ SOLN: 0.0000 [IU] | Freq: Every day | SUBCUTANEOUS | Status: DC

## 2017-01-26 MED ORDER — VENLAFAXINE HCL 37.5 MG PO TABS
75.0000 mg | ORAL_TABLET | Freq: Two times a day (BID) | ORAL | Status: DC
  Administered 2017-01-26: 75 mg via ORAL
  Filled 2017-01-26 (×2): qty 1

## 2017-01-26 MED ORDER — INSULIN GLARGINE 100 UNIT/ML ~~LOC~~ SOLN
25.0000 [IU] | Freq: Every day | SUBCUTANEOUS | Status: DC
  Administered 2017-01-26: 25 [IU] via SUBCUTANEOUS
  Filled 2017-01-26 (×2): qty 0.25

## 2017-01-26 NOTE — Progress Notes (Signed)
Discharge instructions given and went over with patient at bedside by Tamala Fothergill, RN.   6:11 PM All questions answered. Patient discharged home with family via wheelchair by nursing staff. Madlyn Frankel, RN

## 2017-01-26 NOTE — Discharge Instructions (Signed)
Heart healthy and ADA diet. °

## 2017-01-26 NOTE — Consult Note (Signed)
Andrew Lame, MD [facility]  [address] [place], [place] [address] Phone: [phone] Fax : [phone]  Consultation  Referring Provider:     Dr. Vianne Bulls Primary Care Physician:  [facility] Primary Gastroenterologist:  Althia Forts         Reason for Consultation:     Rectal bleeding  Date of Admission:  01/24/2017 Date of Consultation:  01/26/2017         HPI:   [name] is a 57 y.o. male who was admitted yesterday with syncope that was thought to be from dehydration. The patient had a bowel movement this morning that he states happened after he had a very hard stool. The patient was found to have rectal bleeding at that time. The patient reports that his last colonoscopy was approximately 10 years ago. He denies any unexplained weight loss fevers chills nausea or vomiting. He reports also that his constipation is intermittent and he has not had a change in bowel habits. The patient has no abdominal pain. He also denies having any chronic rectal bleeding prior to this morning.  Past Medical History:  Diagnosis Date  . Cancer ([place])   . Chronic back pain   . Chronic neck pain   . Cirrhosis of liver ([place])   . Complicated migraine 07/01/3014  . Coronary artery disease   . Depression   . Gallstones   . Headache(784.0)   . Hepatitis C   . Hypercholesteremia   . Hypertension   . Left leg paresthesias   . Myocardial infarction ([place])   . Stroke [facility])    TIA  . Vertigo     Past Surgical History:  Procedure Laterality Date  . BYPASS GRAFT     triple bypass surgery  . CARDIAC SURGERY    . CORONARY ARTERY BYPASS GRAFT    . HAND SURGERY  1984  . ORTHOPEDIC SURGERY     "on my legs"    Prior to Admission medications   Medication Sig Start Date End Date Taking? Authorizing Provider  albuterol (PROVENTIL HFA;VENTOLIN HFA) 108 (90 Base) MCG/ACT inhaler Inhale 2 puffs into the lungs 3 (three) times daily as needed for wheezing or shortness of breath.    Yes [provider]  amLODipine (NORVASC) 10 MG tablet Take 5 mg by mouth daily.   Yes [provider]  aspirin 81 MG chewable tablet Chew 81 mg by mouth every morning.    Yes [provider]  atorvastatin (LIPITOR) 20 MG tablet Take 10 mg by mouth at bedtime.    Yes [provider]  Calcium Carbonate-Vitamin D (CALCIUM 600+D) 600-400 MG-UNIT tablet Take 1 tablet by mouth daily.   Yes [provider]  carvedilol (COREG) 25 MG tablet Take 25 mg by mouth 2 (two) times daily with a meal.   Yes [provider]  cetirizine (ZYRTEC) 10 MG tablet Take 10 mg by mouth 2 (two) times daily.    Yes [provider]  Cholecalciferol (VITAMIN D3) 1000 units CAPS Take 1,000 Units by mouth daily.    Yes [provider]  clobetasol ointment (TEMOVATE) 0.10 % Apply 1 application topically 2 (two) times daily.   Yes [provider]  colchicine 0.6 MG tablet Take 0.6 mg by mouth as needed (If wrist flares, take 0.6 mg twice daily for 4 days to see if there is improvement.).    Yes [provider]  etodolac (LODINE) 400 MG tablet Take 400 mg by mouth  2 (two) times daily.   Yes [provider]  finasteride (PROSCAR) 5 MG tablet Take 5 mg by mouth daily.   Yes [provider]  fluticasone (FLONASE) 50 MCG/ACT nasal spray Place 2 sprays into both nostrils daily.    Yes [provider]  furosemide (LASIX) 40 MG tablet Take 40 mg by mouth daily.   Yes [provider]  gabapentin (NEURONTIN) 300 MG capsule Take 900 mg by mouth 3 (three) times daily.    Yes [provider]  Glucose Blood (PRECISION XTRA BLOOD GLUCOSE VI) by In Vitro route.   Yes [provider]  hydrocerin (EUCERIN) CREA Apply 1 application topically 2 (two) times daily.   Yes [provider]  insulin aspart (NOVOLOG) 100 UNIT/ML FlexPen Inject 10 Units into the skin 3 (three) times daily with meals.    Yes  [provider]  insulin glargine (LANTUS) 100 UNIT/ML injection Inject 40 Units into the skin at bedtime.    Yes [provider]  losartan (COZAAR) 100 MG tablet Take 100 mg by mouth daily.    Yes [provider]  magnesium oxide (MAG-OX) 400 MG tablet Take 400 mg by mouth daily.   Yes [provider]  metFORMIN (GLUCOPHAGE) 500 MG tablet Take 500 mg by mouth daily with breakfast.    Yes [provider]  mirtazapine (REMERON) 7.5 MG tablet Take 7.5 mg by mouth at bedtime.   Yes [provider]  naloxone (NARCAN) nasal spray 4 mg/0.1 mL Place 1 spray into the nose.   Yes [provider]  oxybutynin (DITROPAN) 5 MG tablet Take 5 mg by mouth 4 (four) times daily.    Yes [provider]  polyvinyl alcohol (LIQUIFILM TEARS) 1.4 % ophthalmic solution Place 1 drop into both eyes 4 (four) times daily as needed for dry eyes.   Yes [provider]  terazosin (HYTRIN) 2 MG capsule Take 4 mg by mouth at bedtime.   Yes [provider]  traMADol (ULTRAM) 50 MG tablet Take by mouth every 6 (six) hours as needed.   Yes [provider]  diclofenac (VOLTAREN) 75 MG EC tablet Take 1 tablet (75 mg total) by mouth 2 (two) times daily. 10/01/16   Edrick Kins, DPM  isosorbide mononitrate (IMDUR) 60 MG 24 hr tablet Take 60 mg by mouth daily.     [provider]    Family History  Problem Relation Age of Onset  . Diabetes Brother   . Diabetes Sister   . Diabetes Father   . Hypertension Father   . Diabetes Mother   . Hypertension Mother   . Hypertension Sister   . Hypertension Brother      Social History   Tobacco Use  . Smoking status: Former Smoker    Packs/day: 1.00    Years: 35.00    Pack years: 35.00    Last attempt to quit: 01/25/2009    Years since quitting: 8.0  . Smokeless tobacco: Never Used  Substance Use Topics  . Alcohol use: No    Comment: former alcoholic  . Drug use: No     Comment: former cocaine abuse    Allergies as of 01/24/2017 - Review Complete 01/24/2017  Allergen Reaction Noted  . Bee venom Anaphylaxis and Swelling 06/13/2011  . Questran [cholestyramine]  10/01/2016    Review of Systems:    All systems reviewed and negative except where noted in HPI.   Physical Exam:  Vital signs in  last 24 hours: Temp:  [98.2 F (36.8 C)-99.4 F (37.4 C)] 98.2 F (36.8 C) (01/02 1354) Pulse Rate:  [58-70] 58 (01/02 1354) Resp:  [18] 18 (01/02 0424) BP: (149-181)/(79-94) 149/82 (01/02 1354) SpO2:  [97 %-100 %] 99 % (01/02 1354) Last BM Date: 01/26/17 General:   Pleasant, cooperative in NAD Head:  Normocephalic and atraumatic. Eyes:   No icterus.   Conjunctiva pink. PERRLA. Ears:  Normal auditory acuity. Neck:  Supple; no masses or thyroidomegaly Lungs: Respirations even and unlabored. Lungs clear to auscultation bilaterally.   No wheezes, crackles, or rhonchi.  Heart:  Regular rate and rhythm;  Without murmur, clicks, rubs or gallops Abdomen:  Soft, nondistended, nontender. Normal bowel sounds. No appreciable masses or hepatomegaly.  No rebound or guarding.  Rectal:  Not performed. Msk:  Symmetrical without gross deformities.    Extremities:  Without edema, cyanosis or clubbing. Neurologic:  Alert and oriented x3;  grossly normal neurologically. Skin:  Intact without significant lesions or rashes. Cervical Nodes:  No significant cervical adenopathy. Psych:  Alert and cooperative. Normal affect.  LAB RESULTS: Recent Labs    01/24/17 1803 01/26/17 0807  WBC 7.8  --   HGB 13.7 14.4  HCT 41.0  --   PLT 142*  --    BMET Recent Labs    01/24/17 1803 01/25/17 0501 01/26/17 0807  NA 135 137 140  K 3.3* 3.4* 3.3*  CL 97* 102 105  CO2 28 26 26   GLUCOSE 173* 264* 206*  BUN 44* 41* 18  CREATININE 3.76* 2.69* 1.04  CALCIUM 9.6 8.8* 9.2   LFT No results for input(s): PROT, ALBUMIN, AST, ALT, ALKPHOS, BILITOT, BILIDIR, IBILI in the last 72  hours. PT/INR No results for input(s): LABPROT, INR in the last 72 hours.  STUDIES: Ct Head Wo Contrast  Result Date: 01/24/2017 CLINICAL DATA:  Syncopal episode with weakness in both legs today. EXAM: CT HEAD WITHOUT CONTRAST CT CERVICAL SPINE WITHOUT CONTRAST TECHNIQUE: Multidetector CT imaging of the head and cervical spine was performed following the standard protocol without intravenous contrast. Multiplanar CT image reconstructions of the cervical spine were also generated. COMPARISON:  10/11/2012 head CT FINDINGS: CT HEAD FINDINGS BRAIN: The ventricles and sulci are normal. Chronic small vessel ischemic disease of periventricular white matter. No intraparenchymal hemorrhage, mass effect nor midline shift. No acute large vascular territory infarcts. No abnormal extra-axial fluid collections. Basal cisterns are midline and not effaced. No acute cerebellar abnormality. VASCULAR: Moderate atherosclerosis noted at the skullbase. No hyperdense vessels. SKULL/SOFT TISSUES: No skull fracture. No significant soft tissue swelling. ORBITS/SINUSES: The included ocular globes and orbital contents are normal.The mastoid air-cells and included paranasal sinuses are well-aerated. OTHER: None. CT CERVICAL SPINE FINDINGS ALIGNMENT: Vertebral bodies in alignment. Straightening of cervical lordosis. This can be seen with muscle spasm or patient positioning. SKULL BASE AND VERTEBRAE: Cervical vertebral bodies and posterior elements are intact. Intervertebral disc heights preserved. No destructive bony lesions. C1-2 articulation maintained. 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 These contribute to adjacent mild neural foraminal encroachment though more so at C6-7. SOFT TISSUES AND SPINAL CANAL: Nuchal ligament ossification posterior to C5. DISC LEVELS: No significant osseous canal stenosis or neural foraminal narrowing moderate disc space narrowing C3 through  C7 with slight grade 1 retrolisthesis of C6 on C7. UPPER CHEST: Lung apices are clear. OTHER: Atherosclerotic calcifications of the extracranial carotids. IMPRESSION: 1. No acute intracranial abnormality. 2. Chronic microvascular angiopathy of  periventricular white matter. 3. Cervical spondylosis without acute cervical spine fracture or posttraumatic listhesis. Electronically Signed   By: Ashley Royalty M.D.   On: 01/24/2017 18:42   Ct Cervical Spine Wo Contrast  Result Date: 01/24/2017 CLINICAL DATA:  Syncopal episode with weakness in both legs today. EXAM: CT HEAD WITHOUT CONTRAST CT CERVICAL SPINE WITHOUT CONTRAST TECHNIQUE: Multidetector CT imaging of the head and cervical spine was performed following the standard protocol without intravenous contrast. Multiplanar CT image reconstructions of the cervical spine were also generated. COMPARISON:  10/11/2012 head CT FINDINGS: CT HEAD FINDINGS BRAIN: The ventricles and sulci are normal. Chronic small vessel ischemic disease of periventricular white matter. No intraparenchymal hemorrhage, mass effect nor midline shift. No acute large vascular territory infarcts. No abnormal extra-axial fluid collections. Basal cisterns are midline and not effaced. No acute cerebellar abnormality. VASCULAR: Moderate atherosclerosis noted at the skullbase. No hyperdense vessels. SKULL/SOFT TISSUES: No skull fracture. No significant soft tissue swelling. ORBITS/SINUSES: The included ocular globes and orbital contents are normal.The mastoid air-cells and included paranasal sinuses are well-aerated. OTHER: None. CT CERVICAL SPINE FINDINGS ALIGNMENT: Vertebral bodies in alignment. Straightening of cervical lordosis. This can be seen with muscle spasm or patient positioning. SKULL BASE AND VERTEBRAE: Cervical vertebral bodies and posterior elements are intact. Intervertebral disc heights preserved. No destructive bony lesions. C1-2 articulation maintained. Uncovertebral joint  osteoarthritis noted on the right at C3-4, left at C4-5 and bilaterally at C6-7. These contribute to adjacent mild neural foraminal encroachment though more so at C6-7. SOFT TISSUES AND SPINAL CANAL: Nuchal ligament ossification posterior to C5. DISC LEVELS: No significant osseous canal stenosis or neural foraminal narrowing moderate disc space narrowing C3 through C7 with slight grade 1 retrolisthesis of C6 on C7. UPPER CHEST: Lung apices are clear. OTHER: Atherosclerotic calcifications of the extracranial carotids. IMPRESSION: 1. No acute intracranial abnormality. 2. 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 3. Cervical spondylosis without acute cervical spine fracture or posttraumatic listhesis. Electronically Signed   By: Ashley Royalty M.D.   On: 01/24/2017 18:42   US Renal  Result Date: 01/25/2017 CLINICAL DATA:  Acute kidney injury EXAM: RENAL / URINARY TRACT ULTRASOUND COMPLETE COMPARISON:  10/11/2012 FINDINGS: Right Kidney: Length: 12.2 cm. Echogenicity within normal limits. No mass or hydronephrosis visualized. Left Kidney: Length: 13.2 cm. Echogenicity within normal limits. No mass or hydronephrosis visualized. Bladder: Appears normal for degree of bladder distention. IMPRESSION: Within normal limits.  No evidence of hydronephrosis. Electronically Signed   By: Marybelle Killings M.D.   On: 01/25/2017 09:48      Impression / Plan:   COLSEN MODI is a 57 y.o. y/o male with with rectal bleeding this morning after having a hard bowel movement. The patient is due for a repeat colonoscopy since he states that his last colonoscopy was about 10 years ago. The patient has been given my card and has been told to contact me as an outpatient to set up a outpatient colonoscopy. The patient agrees to this plan. I will sign off.  Please call if any further GI concerns or questions.  We would like to thank you for the opportunity to participate in the care of [employer].      Thank you for involving me in the care of this patient.      LOS: 0 days   Andrew Lame, MD  01/26/2017, 2:33 PM   Note: This dictation was prepared with Dragon dictation along with smaller phrase technology. Any transcriptional errors that  result from this process are unintentional.

## 2017-01-26 NOTE — Care Management Note (Signed)
Case Management Note  Patient Details  Name: LUCY WOOLEVER MRN: [id number] Date of Birth: 1960/03/03  Subjective/Objective:    Admitted to [facility] under observation status with the diagnosis of syncope. Lives with sister, Peter Congo.  Doesn't know telephone number. States Dr. Andi Hence is his primary care physician that he sees at [facility] Seen this doctor last week. Gets is medications at [facility] [address] Received radiation treatments at [facility] last October for prostate cancer. Takes care of all basic activities of daily living himself, doesn't drive. Family helps with errands. No falls. Decreased weight due to diabetes diagnosis. Brother will transport.               Action/Plan: Information faxed to [facility]. If Mr. Grassel status changes to inpatient will ask, if he wants to transfer again.    Expected Discharge Date:                  Expected Discharge Plan:     In-House Referral:   yes  Discharge planning Services     Post Acute Care Choice:    Choice offered to:     DME Arranged:    DME Agency:     HH Arranged:    HH Agency:     Status of Service:     If discussed at [employer], dates discussed:    Additional Comments:  Shelbie Ammons, RN MSN CCM Care Management [phone] 01/26/2017, 12:38 PM

## 2017-01-26 NOTE — Progress Notes (Signed)
Inpatient Diabetes Program Recommendations  AACE/ADA: New Consensus Statement on Inpatient Glycemic Control (2015)  Target Ranges:  Prepandial:   less than 140 mg/dL      Peak postprandial:   less than 180 mg/dL (1-2 hours)      Critically ill patients:  140 - 180 mg/dL   Results for [name], [name] (MRN [id number]) as of 01/26/2017 10:16  Ref. Range 01/25/2017 07:28 01/25/2017 12:04 01/25/2017 16:31 01/25/2017 20:58  Glucose-Capillary Latest Ref Range: 65 - 99 mg/dL 257 (H) 249 (H) 329 (H) 281 (H)   Results for [name], [name] (MRN [id number]) as of 01/26/2017 10:16  Ref. Range 01/26/2017 07:36  Glucose-Capillary Latest Ref Range: 65 - 99 mg/dL 236 (H)   Admit with: Syncope  History: DM  Home DM Meds: Lantus 40 units QHS      Novolog 10 units TID      Metformin 500 mg daily  Current Insulin Orders: Novolog Moderate Correction Scale/ SSI (0-15 units) TID AC + HS      Novolog 3 units TID     MD- Note patient takes Lantus at home.  Please consider starting a portion of pt's home dose Lantus.  Recommend Lantus 25 units daily to start (2/3 total home dose)     --Will follow patient during hospitalization--  Wyn Quaker RN, MSN, CDE Diabetes Coordinator Inpatient Glycemic Control Team Team Pager: [phone] (8a-5p)

## 2017-01-26 NOTE — Progress Notes (Addendum)
Physical Therapy Evaluation Patient Details Name: [name] MRN: [id number] DOB: 14-Feb-1960 Today's Date: 01/26/2017   History of Present Illness  [name]  is a 57 y.o. male presenting with near syncope associated with generalized weakness/fatigue. Patient stated that he did not lose consciousness, was lightheaded/dizzy all day, in the emergency room patient was found to have blood pressure 89/59, noted acute kidney injury with creatinine 3.7-in November 2018 was 2.1, in January 2016 creatinine was normal at 0.8, CT head/C-spine negative for any acute process, EKG noted for T wave inversions diffusely, patient evaluated the bedside in the emergency room, son is at the bedside, patient in no apparent distress. Patient admits to not drinking much water on a daily basis-only 2 cups typically, patient is now admitted with acute kidney injury, dehydration with associated near syncope, hypotension, and hypokalemia.  Clinical Impression  No deficits identified with bed mobility or transfers. Pt able to ambulate approximately 200' in the hallway without assistive device. He is able to perform horizontal and vertical head turns as well as gait speed changes without lateral gait deviations. VSS throughout ambulation. Pt with some single leg balance deficits on LLE secondary to old fractures but RLE single leg balance is approximately 7 seconds. Pt is safe and steady and able to return home when medically appropriate. No further PT needs identified. Order will be completed.      Follow Up Recommendations No PT follow up    Equipment Recommendations  None recommended by PT    Recommendations for Other Services       Precautions / Restrictions Precautions Precautions: Fall Restrictions Weight Bearing Restrictions: No      Mobility  Bed Mobility Overal bed mobility: Independent             General bed mobility comments: No deficits identified  Transfers Overall transfer  level: Independent Equipment used: None             General transfer comment: No deficits identified. Good speed, strength, and stability  Ambulation/Gait Ambulation/Gait assistance: Supervision Ambulation Distance (Feet): 200 Feet Assistive device: None Gait Pattern/deviations: WFL(Within Functional Limits) Gait velocity: Decreased Gait velocity interpretation: <1.8 ft/sec, indicative of risk for recurrent falls General Gait Details: Pt able to ambulate approximately 200' in the hallway without assistive device. He is able to perform horizontal and vertical head turns as well as gait speed changes without lateral gait deviations. VSS throughout ambulation  Stairs            Wheelchair Mobility    Modified Rankin (Stroke Patients Only)       Balance Overall balance assessment: Needs assistance Sitting-balance support: No upper extremity supported Sitting balance-Leahy Scale: Good     Standing balance support: No upper extremity supported Standing balance-Leahy Scale: Good Standing balance comment: Single leg balance is approximately 7s on RLE and 2s on LLE secondary to chronic fracture                             Pertinent Vitals/Pain Pain Assessment: No/denies pain    Home Living Family/patient expects to be discharged to:: Private residence Living Arrangements: Other relatives;Other (Comment)(Sister) Available Help at Discharge: Family Type of Home: House Home Access: Stairs to enter Entrance Stairs-Rails: Can reach both Entrance Stairs-Number of Steps: 6 Home Layout: One level Home Equipment: Cane - single point;Walker - 2 wheels;Shower seat      Prior Function Level of Independence: Needs assistance   Gait /  Transfers Assistance Needed: Independent ambulation without assistive device. No falls in the last 12 months  ADL's / Homemaking Assistance Needed: Independent with ADLs, assist for IADLs from sister.        Hand Dominance    Dominant Hand: Right    Extremity/Trunk Assessment   Upper Extremity Assessment Upper Extremity Assessment: Overall WFL for tasks assessed    Lower Extremity Assessment Lower Extremity Assessment: Overall WFL for tasks assessed       Communication   Communication: No difficulties  Cognition Arousal/Alertness: Awake/alert Behavior During Therapy: WFL for tasks assessed/performed Overall Cognitive Status: Within Functional Limits for tasks assessed                                        General Comments      Exercises     Assessment/Plan    PT Assessment Patent does not need any further PT services  PT Problem List         PT Treatment Interventions      PT Goals (Current goals can be found in the Care Plan section)  Acute Rehab PT Goals PT Goal Formulation: All assessment and education complete, DC therapy    Frequency     Barriers to discharge        Co-evaluation               AM-PAC PT "6 Clicks" Daily Activity  Outcome Measure Difficulty turning over in bed (including adjusting bedclothes, sheets and blankets)?: None Difficulty moving from lying on back to sitting on the side of the bed? : None Difficulty sitting down on and standing up from a chair with arms (e.g., wheelchair, bedside commode, etc,.)?: None Help needed moving to and from a bed to chair (including a wheelchair)?: None Help needed walking in hospital room?: None Help needed climbing 3-5 steps with a railing? : None 6 Click Score: 24    End of Session Equipment Utilized During Treatment: Gait belt Activity Tolerance: Patient tolerated treatment well Patient left: in bed;with call bell/phone within reach;with bed alarm set   PT Visit Diagnosis: Unsteadiness on feet (R26.81)    Time: 2119-4174 PT Time Calculation (min) (ACUTE ONLY): 10 min   Charges:   PT Evaluation $PT Eval Low Complexity: 1 Low     PT G Codes:   PT G-Codes **NOT FOR INPATIENT  CLASS** Functional Assessment Tool Used: AM-PAC 6 Clicks Basic Mobility Functional Limitation: Mobility: Walking and moving around Mobility: Walking and Moving Around Current Status (Y8144): 0 percent impaired, limited or restricted Mobility: Walking and Moving Around Goal Status (Y1856): 0 percent impaired, limited or restricted Mobility: Walking and Moving Around Discharge Status (D1497): 0 percent impaired, limited or restricted    Phillips Grout PT, DPT    [name] 01/26/2017, 3:52 PM

## 2017-01-26 NOTE — Progress Notes (Signed)
Writer called to room to assess patients stool. Small red streaks noted. Patient stated this has not happened before. Patient is c/o slight abdominal discomfort, what he describes as sore. MD made aware. Awaiting response. Will continue to assess. Madlyn Frankel, RN

## 2017-01-26 NOTE — Discharge Summary (Addendum)
[facility] [place] NAME: [name]    MR#:  [id number]  DATE OF BIRTH:  1960/04/02  DATE OF ADMISSION:  01/24/2017   ADMITTING PHYSICIAN: Gorden Harms, MD  DATE OF DISCHARGE: 01/26/2017 PRIMARY CARE PHYSICIAN: [facility]   ADMISSION DIAGNOSIS:  AKI (acute kidney injury) ([place]) [N17.9] Syncope, unspecified syncope type [R55] Difficulty with speech [R47.9] DISCHARGE DIAGNOSIS:  Active Problems:   Syncope, near   Hematochezia  SECONDARY DIAGNOSIS:   Past Medical History:  Diagnosis Date  . Cancer ([place])   . Chronic back pain   . Chronic neck pain   . Cirrhosis of liver ([place])   . Complicated migraine 0/30/0923  . Coronary artery disease   . Depression   . Gallstones   . Headache(784.0)   . Hepatitis C   . Hypercholesteremia   . Hypertension   . Left leg paresthesias   . Myocardial infarction ([place])   . Stroke [facility])    TIA  . Vertigo    HOSPITAL COURSE:   #59.57 year old male patient with syncope likely due to dehydration.  Now he feels better.  2.  Acute kidney injury: Likely prerenal with hypotension: Improved with IV fluids, renal ultrasound did not show any obstruction.    3 EKG changes without chest pain: Follow-up echocardiogram as outpatient.  Previous echo done in 2014 showed EF 60%.  #4 hypotension: Improved. Hypertension.  Blood pressure is elevated.  Resume home hypertension medication except losartan and Lasix.  #5. hypokalemia, replaced potassium. Hypomagnesemia.  Given magnesium IV and continue p.o. magnesium.  6.  Diabetes mellitus type 2: Continue sliding scale insulin with coverage and lantus.  Rectal bleeding today.  Hemoglobin is normal.  Hold aspirin. FOTB negative. No more active bleeding.  Per Dr. Allen Norris, GI consult, colonoscopy as outpatient. DISCHARGE CONDITIONS:  Stable, discharge to home today. CONSULTS OBTAINED:   DRUG ALLERGIES:   Allergies  Allergen  Reactions  . Bee Venom Anaphylaxis and Swelling  . Questran [Cholestyramine]    DISCHARGE MEDICATIONS:   Allergies as of 01/26/2017      Reactions   Bee Venom Anaphylaxis, Swelling   Questran [cholestyramine]       Medication List    STOP taking these medications   aspirin 81 MG chewable tablet   diclofenac 75 MG EC tablet Commonly known as:  VOLTAREN   etodolac 400 MG tablet Commonly known as:  LODINE   furosemide 40 MG tablet Commonly known as:  LASIX   losartan 100 MG tablet Commonly known as:  COZAAR     TAKE these medications   albuterol 108 (90 Base) MCG/ACT inhaler Commonly known as:  PROVENTIL HFA;VENTOLIN HFA Inhale 2 puffs into the lungs 3 (three) times daily as needed for wheezing or shortness of breath.   amLODipine 10 MG tablet Commonly known as:  NORVASC Take 5 mg by mouth daily.   atorvastatin 20 MG tablet Commonly known as:  LIPITOR Take 10 mg by mouth at bedtime.   CALCIUM 600+D 600-400 MG-UNIT tablet Generic drug:  Calcium Carbonate-Vitamin D Take 1 tablet by mouth daily.   carvedilol 25 MG tablet Commonly known as:  COREG Take 25 mg by mouth 2 (two) times daily with a meal.   cetirizine 10 MG tablet Commonly known as:  ZYRTEC Take 10 mg by mouth 2 (two) times daily.   clobetasol ointment 0.05 % Commonly known as:  TEMOVATE Apply 1 application topically 2 (two) times daily.  colchicine 0.6 MG tablet Take 0.6 mg by mouth as needed (If wrist flares, take 0.6 mg twice daily for 4 days to see if there is improvement.).   finasteride 5 MG tablet Commonly known as:  PROSCAR Take 5 mg by mouth daily.   fluticasone 50 MCG/ACT nasal spray Commonly known as:  FLONASE Place 2 sprays into both nostrils daily.   gabapentin 300 MG capsule Commonly known as:  NEURONTIN Take 900 mg by mouth 3 (three) times daily.   hydrocerin Crea Apply 1 application topically 2 (two) times daily.   insulin aspart 100 UNIT/ML FlexPen Commonly known as:   NOVOLOG Inject 10 Units into the skin 3 (three) times daily with meals.   insulin glargine 100 UNIT/ML injection Commonly known as:  LANTUS Inject 40 Units into the skin at bedtime.   isosorbide mononitrate 60 MG 24 hr tablet Commonly known as:  IMDUR Take 60 mg by mouth daily.   magnesium oxide 400 MG tablet Commonly known as:  MAG-OX Take 400 mg by mouth daily.   metFORMIN 500 MG tablet Commonly known as:  GLUCOPHAGE Take 500 mg by mouth daily with breakfast.   mirtazapine 7.5 MG tablet Commonly known as:  REMERON Take 7.5 mg by mouth at bedtime.   naloxone 4 MG/0.1ML Liqd nasal spray kit Commonly known as:  NARCAN Place 1 spray into the nose.   oxybutynin 5 MG tablet Commonly known as:  DITROPAN Take 5 mg by mouth 4 (four) times daily.   polyvinyl alcohol 1.4 % ophthalmic solution Commonly known as:  LIQUIFILM TEARS Place 1 drop into both eyes 4 (four) times daily as needed for dry eyes.   PRECISION XTRA BLOOD GLUCOSE VI by In Vitro route.   terazosin 2 MG capsule Commonly known as:  HYTRIN Take 4 mg by mouth at bedtime.   traMADol 50 MG tablet Commonly known as:  ULTRAM Take by mouth every 6 (six) hours as needed.   Vitamin D3 1000 units Caps Take 1,000 Units by mouth daily.        DISCHARGE INSTRUCTIONS:  See AVS.  If you experience worsening of your admission symptoms, develop shortness of breath, life threatening emergency, suicidal or homicidal thoughts you must seek medical attention immediately by calling 911 or calling your MD immediately  if symptoms less severe.  You Must read complete instructions/literature along with all the possible adverse reactions/side effects for all the Medicines you take and that have been prescribed to you. Take any new Medicines after you have completely understood and accpet all the possible adverse reactions/side effects.   Please note  You were cared for by a hospitalist during your hospital stay. If you have  any questions about your discharge medications or the care you received while you were in the hospital after you are discharged, you can call the unit and asked to speak with the hospitalist on call if the hospitalist that took care of you is not available. Once you are discharged, your primary care physician will handle any further medical issues. Please note that NO REFILLS for any discharge medications will be authorized once you are discharged, as it is imperative that you return to your primary care physician (or establish a relationship with a primary care physician if you do not have one) for your aftercare needs so that they can reassess your need for medications and monitor your lab values.    On the day of Discharge:  VITAL SIGNS:  Blood pressure (!) 149/82, pulse Marland Kitchen)  58, temperature 98.2 F (36.8 C), temperature source Oral, resp. rate 18, height _0  (1.905 m), weight 261 lb 8 oz (118.6 kg), SpO2 99 %. PHYSICAL EXAMINATION:  GENERAL:  57 y.o.-year-old patient lying in the bed with no acute distress.  EYES: Pupils equal, round, reactive to light and accommodation. No scleral icterus. Extraocular muscles intact.  HEENT: Head atraumatic, normocephalic. Oropharynx and nasopharynx clear.  NECK:  Supple, no jugular venous distention. No thyroid enlargement, no tenderness.  LUNGS: Normal breath sounds bilaterally, no wheezing, rales,rhonchi or crepitation. No use of accessory muscles of respiration.  CARDIOVASCULAR: S1, S2 normal. No murmurs, rubs, or gallops.  ABDOMEN: Soft, non-tender, non-distended. Bowel sounds present. No organomegaly or mass.  EXTREMITIES: No pedal edema, cyanosis, or clubbing.  NEUROLOGIC: Cranial nerves II through XII are intact. Muscle strength 5/5 in all extremities. Sensation intact. Gait not checked.  PSYCHIATRIC: The patient is alert and oriented x 3.  SKIN: No obvious rash, lesion, or ulcer.  DATA REVIEW:   CBC Recent Labs  Lab 01/24/17 1803  01/26/17 0807  WBC 7.8  --   HGB 13.7 14.4  HCT 41.0  --   PLT 142*  --     Chemistries  Recent Labs  Lab 01/26/17 0807  NA 140  K 3.3*  CL 105  CO2 26  GLUCOSE 206*  BUN 18  CREATININE 1.04  CALCIUM 9.2  MG 1.4*     Microbiology Results  Results for orders placed or performed during the hospital encounter of 10/10/12  MRSA PCR Screening     Status: None   Collection Time: 10/11/12  2:32 AM  Result Value Ref Range Status   MRSA by PCR NEGATIVE NEGATIVE Final    Comment:        The GeneXpert MRSA Assay (FDA approved for NASAL specimens only), is one component of a comprehensive MRSA colonization surveillance program. It is not intended to diagnose MRSA infection nor to guide or monitor treatment for MRSA infections.    RADIOLOGY:  No results found.   Management plans discussed with the patient, family and they are in agreement.  CODE STATUS: Full Code   TOTAL TIME TAKING CARE OF THIS PATIENT: 33 minutes.    Demetrios Loll M.D on 01/26/2017 at 2:52 PM  Between 7am to 6pm - Pager - [phone]  After 6pm go to [email or web address] - Proofreader  [facility]  Office  [phone]  CC: Primary care physician; [place]   Note: This dictation was prepared with Dragon dictation along with smaller phrase technology. Any transcriptional errors that result from this process are unintentional.

## 2017-02-04 ENCOUNTER — Encounter: Payer: Self-pay | Admitting: Gastroenterology

## 2017-02-04 ENCOUNTER — Other Ambulatory Visit
Admission: RE | Admit: 2017-02-04 | Discharge: 2017-02-04 | Disposition: A | Discharge status: Home / Self Care | Payer: Non-veteran care | Source: Ambulatory Visit | Attending: Gastroenterology | Admitting: Gastroenterology

## 2017-02-04 ENCOUNTER — Ambulatory Visit (INDEPENDENT_AMBULATORY_CARE_PROVIDER_SITE_OTHER): Payer: Non-veteran care | Admitting: Gastroenterology

## 2017-02-04 ENCOUNTER — Other Ambulatory Visit: Payer: Self-pay

## 2017-02-04 ENCOUNTER — Encounter (INDEPENDENT_AMBULATORY_CARE_PROVIDER_SITE_OTHER): Payer: Self-pay

## 2017-02-04 VITALS — BP 164/112 | HR 64 | Temp 98.5°F | Ht 75.0 in | Wt 246.2 lb

## 2017-02-04 DIAGNOSIS — K7460 Unspecified cirrhosis of liver: Secondary | ICD-10-CM

## 2017-02-04 DIAGNOSIS — B1920 Unspecified viral hepatitis C without hepatic coma: Secondary | ICD-10-CM | POA: Diagnosis present

## 2017-02-04 DIAGNOSIS — Z1211 Encounter for screening for malignant neoplasm of colon: Secondary | ICD-10-CM

## 2017-02-04 DIAGNOSIS — I8500 Esophageal varices without bleeding: Secondary | ICD-10-CM | POA: Diagnosis not present

## 2017-02-04 LAB — COMPREHENSIVE METABOLIC PANEL
ALBUMIN: 4 g/dL (ref 3.5–5.0)
ALK PHOS: 95 U/L (ref 38–126)
ALT: 46 U/L (ref 17–63)
AST: 46 U/L — AB (ref 15–41)
Anion gap: 9 (ref 5–15)
BILIRUBIN TOTAL: 1.1 mg/dL (ref 0.3–1.2)
BUN: 11 mg/dL (ref 6–20)
CALCIUM: 9.2 mg/dL (ref 8.9–10.3)
CO2: 22 mmol/L (ref 22–32)
CREATININE: 0.94 mg/dL (ref 0.61–1.24)
Chloride: 108 mmol/L (ref 101–111)
GFR calc Af Amer: >60 mL/min (ref >60)
GFR calc non Af Amer: >60 mL/min (ref >60)
GLUCOSE: 121 mg/dL — AB (ref 65–99)
Potassium: 3.7 mmol/L (ref 3.5–5.1)
Sodium: 139 mmol/L (ref 135–145)
TOTAL PROTEIN: 7.2 g/dL (ref 6.5–8.1)

## 2017-02-04 LAB — CBC
HCT: 46.7 % (ref 40.0–52.0)
Hemoglobin: 15.5 g/dL (ref 13.0–18.0)
MCH: 28.9 pg (ref 26.0–34.0)
MCHC: 33.2 g/dL (ref 32.0–36.0)
MCV: 87.1 fL (ref 80.0–100.0)
PLATELETS: 163 10*3/uL (ref 150–440)
RBC: 5.36 MIL/uL (ref 4.40–5.90)
RDW: 13.3 % (ref 11.5–14.5)
WBC: 8.2 10*3/uL (ref 3.8–10.6)

## 2017-02-04 LAB — PROTIME-INR
INR: 1.18
PROTHROMBIN TIME: 14.9 s (ref 11.4–15.2)

## 2017-02-04 NOTE — Progress Notes (Signed)
[name] [address]  [place]  [place], [place] [address]  Main: [phone]  Fax: [phone]   Gastroenterology Consultation  Referring Provider:     [facility] [place] Physician:  [place] Primary Gastroenterologist:  Dr. Vonda Macdonald Reason for Consultation:     BRBPR, constipation        HPI:   [name] is a 57 y.o. y/o male referred for consultation & management  by Dr. Domingo Madeira, [facility].  Patient was recently in the hospital for dizziness which was thought to be due to dehydration.  During the stay he had constipation and had a hard stool with bright red blood per rectum, only on the toilet paper, not in the stool.  GI was consulted at the time and outpatient colonoscopy was recommended.  Patient has had a colonoscopy 10 years ago and he does not know the results of it.  He is a [place] patient and his previous endoscopy reports are not available to [place].  No abdominal pain, no weight loss, no dysphagia, no heartburn, no nausea vomiting.  No family history of colon cancer.  Incidentally, his lab work shows thrombocytopenia with platelets of 118.  An ultrasound in 2014 on epic reports evidence of cirrhosis.  Patient states he has been told in the past that he has cirrhosis but has not had any EGDs for variceal screening.  Reports previous history of heavy alcohol use for years, has not had a drink in 12 years.  Has never had a paracentesis.  No episodes of confusion.  Denies abdominal distention.  States he thinks he was seeing GI at the [place].  Past Medical History:  Diagnosis Date  . Cancer ([place])   . Chronic back pain   . Chronic neck pain   . Cirrhosis of liver ([place])   . Complicated migraine 9/37/1696  . Coronary artery disease   . Depression   . Gallstones   . Headache(784.0)   . Hepatitis C   . Hypercholesteremia   . Hypertension   . Left leg paresthesias   . Myocardial infarction ([place])   . Stroke  [facility])    TIA  . Vertigo     Past Surgical History:  Procedure Laterality Date  . BYPASS GRAFT     triple bypass surgery  . CARDIAC SURGERY    . CORONARY ARTERY BYPASS GRAFT    . HAND SURGERY  1984  . ORTHOPEDIC SURGERY     "on my legs"    Prior to Admission medications   Medication Sig Start Date End Date Taking? Authorizing Provider  albuterol (PROVENTIL HFA;VENTOLIN HFA) 108 (90 Base) MCG/ACT inhaler Inhale 2 puffs into the lungs 3 (three) times daily as needed for wheezing or shortness of breath.   Yes [provider]  amLODipine (NORVASC) 10 MG tablet Take 5 mg by mouth daily.   Yes [provider]  atorvastatin (LIPITOR) 20 MG tablet Take 10 mg by mouth at bedtime.    Yes [provider]  Calcium Carbonate-Vitamin D (CALCIUM 600+D) 600-400 MG-UNIT tablet Take 1 tablet by mouth daily.   Yes [provider]  carvedilol (COREG) 25 MG tablet Take 25 mg by mouth 2 (two) times daily with a meal.   Yes [provider]  cetirizine (ZYRTEC) 10 MG tablet Take 10 mg by mouth 2 (two) times daily.    Yes [provider]  Cholecalciferol (VITAMIN D3) 1000 units CAPS Take 1,000 Units  by mouth daily.    Yes [provider]  clobetasol ointment (TEMOVATE) 1.24 % Apply 1 application topically 2 (two) times daily.   Yes [provider]  colchicine 0.6 MG tablet Take 0.6 mg by mouth as needed (If wrist flares, take 0.6 mg twice daily for 4 days to see if there is improvement.).    Yes [provider]  finasteride (PROSCAR) 5 MG tablet Take 5 mg by mouth daily.   Yes [provider]  fluticasone (FLONASE) 50 MCG/ACT nasal spray Place 2 sprays into both nostrils daily.    Yes [provider]  gabapentin (NEURONTIN) 300 MG capsule Take 900 mg by mouth 3 (three) times daily.    Yes [provider]  Glucose Blood (PRECISION XTRA BLOOD GLUCOSE VI) by In Vitro route.   Yes [provider]    hydrocerin (EUCERIN) CREA Apply 1 application topically 2 (two) times daily.   Yes [provider]  insulin aspart (NOVOLOG) 100 UNIT/ML FlexPen Inject 10 Units into the skin 3 (three) times daily with meals.    Yes [provider]  insulin glargine (LANTUS) 100 UNIT/ML injection Inject 40 Units into the skin at bedtime.    Yes [provider]  isosorbide mononitrate (IMDUR) 60 MG 24 hr tablet Take 60 mg by mouth daily.    Yes [provider]  magnesium oxide (MAG-OX) 400 MG tablet Take 400 mg by mouth daily.   Yes [provider]  metFORMIN (GLUCOPHAGE) 500 MG tablet Take 500 mg by mouth daily with breakfast.    Yes [provider]  mirtazapine (REMERON) 7.5 MG tablet Take 7.5 mg by mouth at bedtime.   Yes [provider]  naloxone (NARCAN) nasal spray 4 mg/0.1 mL Place 1 spray into the nose.   Yes [provider]  oxybutynin (DITROPAN) 5 MG tablet Take 5 mg by mouth 4 (four) times daily.    Yes [provider]  polyvinyl alcohol (LIQUIFILM TEARS) 1.4 % ophthalmic solution Place 1 drop into both eyes 4 (four) times daily as needed for dry eyes.   Yes [provider]  terazosin (HYTRIN) 2 MG capsule Take 4 mg by mouth at bedtime.   Yes [provider]    Family History  Problem Relation Age of Onset  . Diabetes Brother   . Diabetes Sister   . Diabetes Father   . Hypertension Father   . Diabetes Mother   . Hypertension Mother   . Hypertension Sister   . Hypertension Brother      Social History   Tobacco Use  . Smoking status: Former Smoker    Packs/day: 1.00    Years: 35.00    Pack years: 35.00    Last attempt to quit: 01/25/2009    Years since quitting: 8.0  . Smokeless tobacco: Never Used  Substance Use Topics  . Alcohol use: No    Comment: former alcoholic  . Drug use: No    Comment: former cocaine abuse    Allergies as of 02/04/2017 - Review Complete 02/04/2017  Allergen  Reaction Noted  . Bee venom Anaphylaxis and Swelling 06/13/2011  . Questran [cholestyramine]  10/01/2016    Review of Systems:    All systems reviewed and negative except where noted in HPI.   Physical Exam:  BP (!) 164/112   Pulse 64   Temp 98.5 F (36.9 C) (Oral)   Ht 6\' 3"  (1.905 m)   Wt 246 lb 3.2 oz (111.7  kg)   BMI 30.77 kg/m  No LMP for male patient. Psych:  Alert and cooperative. Normal mood and affect. General:   Alert,  Well-developed, well-nourished, pleasant and cooperative in NAD Head:  Normocephalic and atraumatic. Eyes:  Sclera clear, no icterus.   Conjunctiva pink. Ears:  Normal auditory acuity. Nose:  No deformity, discharge, or lesions. Mouth:  No deformity or lesions,oropharynx pink & moist. Neck:  Supple; no masses or thyromegaly. Lungs:  Respirations even and unlabored.  Clear throughout to auscultation.   No wheezes, crackles, or rhonchi. No acute distress. Heart:  Regular rate and rhythm; no murmurs, clicks, rubs, or gallops. Abdomen:  Normal bowel sounds.  No bruits.  Soft, non-tender and non-distended without masses, hepatosplenomegaly or hernias noted.  No guarding or rebound tenderness.    Msk:  Symmetrical without gross deformities. Good, equal movement & strength bilaterally. Pulses:  Normal pulses noted. Extremities:  No clubbing or edema.  No cyanosis. Neurologic:  Alert and oriented x3;  grossly normal neurologically. Skin:  Intact without significant lesions or rashes. No jaundice. Lymph Nodes:  No significant cervical adenopathy. Psych:  Alert and cooperative. Normal mood and affect.   Labs: CBC    Component Value Date/Time   WBC 8.2 02/04/2017 1241   RBC 5.36 02/04/2017 1241   HGB 15.5 02/04/2017 1241   HCT 46.7 02/04/2017 1241   PLT 163 02/04/2017 1241   MCV 87.1 02/04/2017 1241   MCH 28.9 02/04/2017 1241   MCHC 33.2 02/04/2017 1241   RDW 13.3 02/04/2017 1241   LYMPHSABS 2.6 12/19/2016 0530   MONOABS 0.8 12/19/2016 0530   EOSABS  0.5 12/19/2016 0530   BASOSABS 0.0 12/19/2016 0530   CMP     Component Value Date/Time   NA 139 02/04/2017 1241   K 3.7 02/04/2017 1241   CL 108 02/04/2017 1241   CO2 22 02/04/2017 1241   GLUCOSE 121 (H) 02/04/2017 1241   BUN 11 02/04/2017 1241   CREATININE 0.94 02/04/2017 1241   CALCIUM 9.2 02/04/2017 1241   PROT 7.2 02/04/2017 1241   ALBUMIN 4.0 02/04/2017 1241   AST 46 (H) 02/04/2017 1241   ALT 46 02/04/2017 1241   ALKPHOS 95 02/04/2017 1241   BILITOT 1.1 02/04/2017 1241   GFRNONAA >60 02/04/2017 1241   GFRAA >60 02/04/2017 1241    Imaging Studies: Ct Head Wo Contrast  Result Date: 01/24/2017 CLINICAL DATA:  Syncopal episode with weakness in both legs today. EXAM: CT HEAD WITHOUT CONTRAST CT CERVICAL SPINE WITHOUT CONTRAST TECHNIQUE: Multidetector CT imaging of the head and cervical spine was performed following the standard protocol without intravenous contrast. Multiplanar CT image reconstructions of the cervical spine were also generated. COMPARISON:  10/11/2012 head CT FINDINGS: CT HEAD FINDINGS BRAIN: The ventricles and sulci are normal. Chronic small vessel ischemic disease of periventricular white matter. No intraparenchymal hemorrhage, mass effect nor midline shift. No acute large vascular territory infarcts. No abnormal extra-axial fluid collections. Basal cisterns are midline and not effaced. No acute cerebellar abnormality. VASCULAR: Moderate atherosclerosis noted at the skullbase. No hyperdense vessels. SKULL/SOFT TISSUES: No skull fracture. No significant soft tissue swelling. ORBITS/SINUSES: The included ocular globes and orbital contents are normal.The mastoid air-cells and included paranasal sinuses are well-aerated. OTHER: None. CT CERVICAL SPINE FINDINGS ALIGNMENT: Vertebral bodies in alignment. Straightening of cervical lordosis. This can be seen with muscle spasm or patient positioning. SKULL BASE AND VERTEBRAE: Cervical vertebral bodies and posterior elements are  intact. Intervertebral disc heights preserved. No destructive bony lesions. C1-2  articulation maintained. 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 These contribute to adjacent mild neural foraminal encroachment though more so at C6-7. SOFT TISSUES AND SPINAL CANAL: Nuchal ligament ossification posterior to C5. DISC LEVELS: No significant osseous canal stenosis or neural foraminal narrowing moderate disc space narrowing C3 through C7 with slight grade 1 retrolisthesis of C6 on C7. UPPER CHEST: Lung apices are clear. OTHER: Atherosclerotic calcifications of the extracranial carotids. IMPRESSION: 1. No acute intracranial abnormality. 2. 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 3. Cervical spondylosis without acute cervical spine fracture or posttraumatic listhesis. Electronically Signed   By: Ashley Royalty M.D.   On: 01/24/2017 18:42   Ct Cervical Spine Wo Contrast  Result Date: 01/24/2017 CLINICAL DATA:  Syncopal episode with weakness in both legs today. EXAM: CT HEAD WITHOUT CONTRAST CT CERVICAL SPINE WITHOUT CONTRAST TECHNIQUE: Multidetector CT imaging of the head and cervical spine was performed following the standard protocol without intravenous contrast. Multiplanar CT image reconstructions of the cervical spine were also generated. COMPARISON:  10/11/2012 head CT FINDINGS: CT HEAD FINDINGS BRAIN: The ventricles and sulci are normal. Chronic small vessel ischemic disease of periventricular white matter. No intraparenchymal hemorrhage, mass effect nor midline shift. No acute large vascular territory infarcts. No abnormal extra-axial fluid collections. Basal cisterns are midline and not effaced. No acute cerebellar abnormality. VASCULAR: Moderate atherosclerosis noted at the skullbase. No hyperdense vessels. SKULL/SOFT TISSUES: No skull fracture. No significant soft tissue swelling. ORBITS/SINUSES: The included ocular globes and  orbital contents are normal.The mastoid air-cells and included paranasal sinuses are well-aerated. OTHER: None. CT CERVICAL SPINE FINDINGS ALIGNMENT: Vertebral bodies in alignment. Straightening of cervical lordosis. This can be seen with muscle spasm or patient positioning. SKULL BASE AND VERTEBRAE: Cervical vertebral bodies and posterior elements are intact. Intervertebral disc heights preserved. No destructive bony lesions. C1-2 articulation maintained. 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 These contribute to adjacent mild neural foraminal encroachment though more so at C6-7. SOFT TISSUES AND SPINAL CANAL: Nuchal ligament ossification posterior to C5. DISC LEVELS: No significant osseous canal stenosis or neural foraminal narrowing moderate disc space narrowing C3 through C7 with slight grade 1 retrolisthesis of C6 on C7. UPPER CHEST: Lung apices are clear. OTHER: Atherosclerotic calcifications of the extracranial carotids. IMPRESSION: 1. No acute intracranial abnormality. 2. 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 3. Cervical spondylosis without acute cervical spine fracture or posttraumatic listhesis. Electronically Signed   By: Ashley Royalty M.D.   On: 01/24/2017 18:42   US Renal  Result Date: 01/25/2017 CLINICAL DATA:  Acute kidney injury EXAM: RENAL / URINARY TRACT ULTRASOUND COMPLETE COMPARISON:  10/11/2012 FINDINGS: Right Kidney: Length: 12.2 cm. Echogenicity within normal limits. No mass or hydronephrosis visualized. Left Kidney: Length: 13.2 cm. Echogenicity within normal limits. No mass or hydronephrosis visualized. Bladder: Appears normal for degree of bladder distention. IMPRESSION: Within normal limits.  No evidence of hydronephrosis. Electronically Signed   By: Marybelle Killings M.D.   On: 01/25/2017 09:48    Assessment and Plan:   STEFEN JUBA is a 57 y.o. y/o male has been referred for one episode of  bright red blood per rectum after hard stool with no drop in hemoglobin  Patient's bright red blood per rectum was likely related to hemorrhoids as it was an isolated episode without any drop in hemoglobin and occurred with hard stool  no repeated episodes of the  same He is due for colonoscopy as he states the last one was over 10 years ago.  We do not have the record of his previous colonoscopy. 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  We will also obtain CMP to evaluate liver enzymes. No evidence of encephalopathy or previous history of bleeding  History of hepatitis C Patient reports being treated at the [facility] 5-6 years ago We will check hep C RNA level as well   we will try to obtain records from [place] and primary care   Dr [name]

## 2017-02-05 LAB — HCV RNA QUANT: HCV Quantitative: NOT DETECTED IU/mL (ref >50)

## 2017-02-14 NOTE — Discharge Instructions (Signed)
General Anesthesia, Adult, Care After °These instructions provide you with information about caring for yourself after your procedure. Your health care provider may also give you more specific instructions. Your treatment has been planned according to current medical practices, but problems sometimes occur. Call your health care provider if you have any problems or questions after your procedure. °What can I expect after the procedure? °After the procedure, it is common to have: °· Vomiting. °· A sore throat. °· Mental slowness. ° °It is common to feel: °· Nauseous. °· Cold or shivery. °· Sleepy. °· Tired. °· Sore or achy, even in parts of your body where you did not have surgery. ° °Follow these instructions at home: °For at least 24 hours after the procedure: °· Do not: °? Participate in activities where you could fall or become injured. °? Drive. °? Use heavy machinery. °? Drink alcohol. °? Take sleeping pills or medicines that cause drowsiness. °? Make important decisions or sign legal documents. °? Take care of children on your own. °· Rest. °Eating and drinking °· If you vomit, drink water, juice, or soup when you can drink without vomiting. °· Drink enough fluid to keep your urine clear or pale yellow. °· Make sure you have little or no nausea before eating solid foods. °· Follow the diet recommended by your health care provider. °General instructions °· Have a responsible adult stay with you until you are awake and alert. °· Return to your normal activities as told by your health care provider. Ask your health care provider what activities are safe for you. °· Take over-the-counter and prescription medicines only as told by your health care provider. °· If you smoke, do not smoke without supervision. °· Keep all follow-up visits as told by your health care provider. This is important. °Contact a health care provider if: °· You continue to have nausea or vomiting at home, and medicines are not helpful. °· You  cannot drink fluids or start eating again. °· You cannot urinate after 8-12 hours. °· You develop a skin rash. °· You have fever. °· You have increasing redness at the site of your procedure. °Get help right away if: °· You have difficulty breathing. °· You have chest pain. °· You have unexpected bleeding. °· You feel that you are having a life-threatening or urgent problem. °This information is not intended to replace advice given to you by your health care provider. Make sure you discuss any questions you have with your health care provider. °Document Released: 04/19/2000 Document Revised: 06/16/2015 Document Reviewed: 12/26/2014 °Elsevier Interactive Patient Education © 2018 [employer] ° °

## 2017-02-15 ENCOUNTER — Ambulatory Visit: Payer: Medicaid Other | Admitting: Anesthesiology

## 2017-02-15 ENCOUNTER — Encounter
Admission: RE | Disposition: A | Discharge status: Home / Self Care | Payer: Self-pay | Source: Ambulatory Visit | Attending: Gastroenterology

## 2017-02-15 ENCOUNTER — Ambulatory Visit
Admission: RE | Admit: 2017-02-15 | Discharge: 2017-02-15 | Disposition: A | Discharge status: Home / Self Care | Payer: Medicaid Other | Source: Ambulatory Visit | Attending: Gastroenterology | Admitting: Gastroenterology

## 2017-02-15 DIAGNOSIS — K2950 Unspecified chronic gastritis without bleeding: Secondary | ICD-10-CM | POA: Insufficient documentation

## 2017-02-15 DIAGNOSIS — I2510 Atherosclerotic heart disease of native coronary artery without angina pectoris: Secondary | ICD-10-CM | POA: Diagnosis not present

## 2017-02-15 DIAGNOSIS — Z8673 Personal history of transient ischemic attack (TIA), and cerebral infarction without residual deficits: Secondary | ICD-10-CM | POA: Insufficient documentation

## 2017-02-15 DIAGNOSIS — Z794 Long term (current) use of insulin: Secondary | ICD-10-CM | POA: Insufficient documentation

## 2017-02-15 DIAGNOSIS — Z7951 Long term (current) use of inhaled steroids: Secondary | ICD-10-CM | POA: Insufficient documentation

## 2017-02-15 DIAGNOSIS — K228 Other specified diseases of esophagus: Secondary | ICD-10-CM | POA: Diagnosis not present

## 2017-02-15 DIAGNOSIS — Z13810 Encounter for screening for upper gastrointestinal disorder: Secondary | ICD-10-CM

## 2017-02-15 DIAGNOSIS — K2289 Other specified disease of esophagus: Secondary | ICD-10-CM

## 2017-02-15 DIAGNOSIS — Z951 Presence of aortocoronary bypass graft: Secondary | ICD-10-CM | POA: Insufficient documentation

## 2017-02-15 DIAGNOSIS — Z79899 Other long term (current) drug therapy: Secondary | ICD-10-CM | POA: Insufficient documentation

## 2017-02-15 DIAGNOSIS — F329 Major depressive disorder, single episode, unspecified: Secondary | ICD-10-CM | POA: Diagnosis not present

## 2017-02-15 DIAGNOSIS — K6289 Other specified diseases of anus and rectum: Secondary | ICD-10-CM

## 2017-02-15 DIAGNOSIS — I252 Old myocardial infarction: Secondary | ICD-10-CM | POA: Diagnosis not present

## 2017-02-15 DIAGNOSIS — E7800 Pure hypercholesterolemia, unspecified: Secondary | ICD-10-CM | POA: Diagnosis not present

## 2017-02-15 DIAGNOSIS — I8510 Secondary esophageal varices without bleeding: Secondary | ICD-10-CM | POA: Diagnosis not present

## 2017-02-15 DIAGNOSIS — E119 Type 2 diabetes mellitus without complications: Secondary | ICD-10-CM | POA: Insufficient documentation

## 2017-02-15 DIAGNOSIS — I10 Essential (primary) hypertension: Secondary | ICD-10-CM | POA: Diagnosis not present

## 2017-02-15 DIAGNOSIS — Z87891 Personal history of nicotine dependence: Secondary | ICD-10-CM | POA: Insufficient documentation

## 2017-02-15 DIAGNOSIS — I8500 Esophageal varices without bleeding: Secondary | ICD-10-CM | POA: Diagnosis not present

## 2017-02-15 DIAGNOSIS — Z1389 Encounter for screening for other disorder: Secondary | ICD-10-CM | POA: Diagnosis not present

## 2017-02-15 DIAGNOSIS — Z1211 Encounter for screening for malignant neoplasm of colon: Secondary | ICD-10-CM

## 2017-02-15 DIAGNOSIS — J45909 Unspecified asthma, uncomplicated: Secondary | ICD-10-CM | POA: Diagnosis not present

## 2017-02-15 HISTORY — PX: COLONOSCOPY WITH PROPOFOL: SHX5780

## 2017-02-15 HISTORY — DX: Unspecified asthma, uncomplicated: J45.909

## 2017-02-15 HISTORY — PX: ESOPHAGOGASTRODUODENOSCOPY (EGD) WITH PROPOFOL: SHX5813

## 2017-02-15 HISTORY — DX: Type 2 diabetes mellitus without complications: E11.9

## 2017-02-15 LAB — GLUCOSE, CAPILLARY
Glucose-Capillary: 171 mg/dL — ABNORMAL HIGH (ref 65–99)
Glucose-Capillary: 144 mg/dL — ABNORMAL HIGH (ref 65–99)

## 2017-02-15 SURGERY — COLONOSCOPY WITH PROPOFOL
Anesthesia: General | Site: Throat | Wound class: Contaminated

## 2017-02-15 MED ORDER — PROMETHAZINE HCL 25 MG/ML IJ SOLN
6.2500 mg | INTRAMUSCULAR | Status: DC | PRN
Start: 2017-02-15 — End: 2017-02-15

## 2017-02-15 MED ORDER — FENTANYL CITRATE (PF) 100 MCG/2ML IJ SOLN: 25.0000 ug | INTRAMUSCULAR | Status: DC | PRN

## 2017-02-15 MED ORDER — MEPERIDINE HCL 25 MG/ML IJ SOLN: 6.2500 mg | INTRAMUSCULAR | Status: DC | PRN

## 2017-02-15 MED ORDER — GLYCOPYRROLATE 0.2 MG/ML IJ SOLN
INTRAMUSCULAR | Status: DC | PRN
  Administered 2017-02-15: 0.1 mg via INTRAVENOUS

## 2017-02-15 MED ORDER — OXYCODONE HCL 5 MG PO TABS: 5.0000 mg | ORAL_TABLET | Freq: Once | ORAL | Status: DC | PRN

## 2017-02-15 MED ORDER — OXYCODONE HCL 5 MG/5ML PO SOLN: 5.0000 mg | Freq: Once | ORAL | Status: DC | PRN

## 2017-02-15 MED ORDER — LACTATED RINGERS IV SOLN
10.0000 mL/h | INTRAVENOUS | Status: DC
  Administered 2017-02-15: 10 mL/h via INTRAVENOUS
  Administered 2017-02-15 (×2): via INTRAVENOUS

## 2017-02-15 MED ORDER — STERILE WATER FOR IRRIGATION IR SOLN
Status: DC | PRN
  Administered 2017-02-15: 11:00:00

## 2017-02-15 MED ORDER — PROPOFOL 10 MG/ML IV BOLUS
INTRAVENOUS | Status: DC | PRN
  Administered 2017-02-15: 60 mg via INTRAVENOUS
  Administered 2017-02-15: 30 mg via INTRAVENOUS
  Administered 2017-02-15 (×3): 50 mg via INTRAVENOUS
  Administered 2017-02-15 (×2): 20 mg via INTRAVENOUS
  Administered 2017-02-15 (×2): 30 mg via INTRAVENOUS
  Administered 2017-02-15: 50 mg via INTRAVENOUS
  Administered 2017-02-15: 40 mg via INTRAVENOUS
  Administered 2017-02-15: 20 mg via INTRAVENOUS
  Administered 2017-02-15: 40 mg via INTRAVENOUS
  Administered 2017-02-15: 50 mg via INTRAVENOUS
  Administered 2017-02-15: 20 mg via INTRAVENOUS
  Administered 2017-02-15: 30 mg via INTRAVENOUS
  Administered 2017-02-15: 50 mg via INTRAVENOUS
  Administered 2017-02-15: 90 mg via INTRAVENOUS

## 2017-02-15 MED ORDER — SODIUM CHLORIDE 0.9 % IV SOLN: INTRAVENOUS | Status: DC

## 2017-02-15 SURGICAL SUPPLY — 8 items
BLOCK BITE 60FR ADLT L/F GRN (MISCELLANEOUS) ×3 IMPLANT
CANISTER SUCT 1200ML W/VALVE (MISCELLANEOUS) ×3 IMPLANT
FORCEPS BIOP RAD 4 LRG CAP 4 (CUTTING FORCEPS) ×1 IMPLANT
GOWN CVR UNV OPN BCK APRN NK (MISCELLANEOUS) ×4 IMPLANT
GOWN ISOL THUMB LOOP REG UNIV (MISCELLANEOUS) ×6
KIT ENDO PROCEDURE OLY (KITS) ×3 IMPLANT
SNARE SHORT THROW 13M SML OVAL (MISCELLANEOUS) ×1 IMPLANT
WATER STERILE IRR 250ML POUR (IV SOLUTION) ×3 IMPLANT

## 2017-02-15 NOTE — Anesthesia Preprocedure Evaluation (Signed)
Anesthesia Evaluation  Patient identified by MRN, date of birth, ID band Patient awake    Reviewed: Allergy & Precautions, H&P , NPO status , Patient's Chart, lab work & pertinent test results, reviewed documented beta blocker date and time   Airway Mallampati: II  TM Distance: >3 FB Neck ROM: full    Dental no notable dental hx.    Pulmonary asthma , former smoker,    Pulmonary exam normal breath sounds clear to auscultation       Cardiovascular Exercise Tolerance: Good hypertension, + CAD, + Past MI and + CABG  negative cardio ROS   Rhythm:regular Rate:Normal     Neuro/Psych  Headaches, PSYCHIATRIC DISORDERS CVA    GI/Hepatic negative GI ROS, (+) Hepatitis -, C  Endo/Other  negative endocrine ROSdiabetes  Renal/GU CRFRenal disease  negative genitourinary   Musculoskeletal   Abdominal   Peds  Hematology negative hematology ROS (+)   Anesthesia Other Findings   Reproductive/Obstetrics negative OB ROS                             Anesthesia Physical Anesthesia Plan  ASA: III  Anesthesia Plan: General   Post-op Pain Management:    Induction:   PONV Risk Score and Plan:   Airway Management Planned:   Additional Equipment:   Intra-op Plan:   Post-operative Plan:   Informed Consent: I have reviewed the patients History and Physical, chart, labs and discussed the procedure including the risks, benefits and alternatives for the proposed anesthesia with the patient or authorized representative who has indicated his/her understanding and acceptance.   Dental Advisory Given  Plan Discussed with: CRNA  Anesthesia Plan Comments:         Anesthesia Quick Evaluation

## 2017-02-15 NOTE — Anesthesia Procedure Notes (Signed)
Procedure Name: MAC Performed by: Limuel Nieblas, CRNA Pre-anesthesia Checklist: Patient identified, Emergency Drugs available, Suction available, Patient being monitored and Timeout performed Patient Re-evaluated:Patient Re-evaluated prior to induction Oxygen Delivery Method: Nasal cannula Preoxygenation: Pre-oxygenation with 100% oxygen       

## 2017-02-15 NOTE — Op Note (Addendum)
[facility] Gastroenterology Patient Name: [name] Procedure Date: 02/15/2017 11:04 AM MRN: [id number] Account #: [id number] Date of Birth: July 15, 1960 Admit Type: Outpatient Age: 57 Room: [facility] OR ROOM 01 Gender: Male Note Status: Supervisor Override Procedure:            Upper GI endoscopy Indications:          Variceal screening (no known varices or prior bleeding) Providers:            Geddy Boydstun B. Bonna Gains MD, MD Referring MD:         [facility], MD (Referring MD) Medicines:            Monitored Anesthesia Care Complications:        No immediate complications. Procedure:            Pre-Anesthesia Assessment:                       - The risks and benefits of the procedure and the                        sedation options and risks were discussed with the                        patient. All questions were answered and informed                        consent was obtained.                       - Patient identification and proposed procedure were                        verified prior to the procedure.                       - ASA Grade Assessment: III - A patient with severe                        systemic disease.                       After obtaining informed consent, the endoscope was                        passed under direct vision. Throughout the procedure,                        the patient's blood pressure, pulse, and oxygen                        saturations were monitored continuously. The Olympus                        GIF H180J Endoscope (S#: [id number]) was introduced                        through the mouth, and advanced to the second part of                        duodenum. The upper GI endoscopy was accomplished with  ease. The patient tolerated the procedure well. Findings:      One [place] of salmon-colored mucosa was present. No other visible       abnormalities were present. The maximum longitudinal extent  of these       esophageal mucosal changes was 0.5 cm in length. Biopsies were taken       with a cold forceps for histology.      Grade II solated varix was found in the middle third of the esophagus.       This was small in size.      Patchy mildly erythematous mucosa without bleeding was found in the       gastric antrum. Biopsies were taken with a cold forceps for histology.      The duodenal bulb and second portion of the duodenum were normal. Impression:           - Salmon-colored mucosa suspicious for Barrett's                        esophagus. Biopsied.                       - Grade II esophageal varix.                       - Erythematous mucosa in the antrum. Biopsied.                       - Normal duodenal bulb and second portion of the                        duodenum. Recommendation:       - Await pathology results.                       - Advance diet as tolerated.                       - Discharge patient to home.                       - Continue present medications.                       - Return to my office in 2 weeks to discuss beta                        blocker therapy. HR and BP will be assessed during the                        clinic visit and medication started if HR and BP allows                        for it.                       - The findings and recommendations were discussed with                        the patient.                       - The findings and recommendations were discussed with  the patient's family. Procedure Code(s):    --- Professional ---                       [phone], Esophagogastroduodenoscopy, flexible, transoral;                        with biopsy, single or multiple Diagnosis Code(s):    --- Professional ---                       Z13.810, Encounter for screening for upper                        gastrointestinal disorder                       K22.8, Other specified diseases of esophagus                        I85.00, Esophageal varices without bleeding                       K31.89, Other diseases of stomach and duodenum CPT copyright 2018 American Medical Association. All rights reserved. The codes documented in this report are preliminary and upon coder review may  be revised to meet current compliance requirements.  Vonda Antigua, MD Margretta Sidle B. Bonna Gains MD, MD 02/15/2017 11:44:06 AM This report has been signed electronically. Number of Addenda: 0 Note Initiated On: 02/15/2017 11:04 AM      [facility]

## 2017-02-15 NOTE — H&P (Signed)
Vonda Antigua, MD [address] [place], [place], [place], [address] [address], [place], [place], [place], [address] Phone: [phone]  Fax: [phone]  Primary Care Physician:  [place]   Pre-Procedure History & Physical: HPI:  [name] is a 57 y.o. male is here for an EGD and colonoscopy.   Past Medical History:  Diagnosis Date  . Asthma   . Cancer ([place])   . Chronic back pain   . Chronic neck pain   . Cirrhosis of liver ([place])   . Complicated migraine 5/57/3220  . Coronary artery disease   . Depression   . Diabetes mellitus without complication ([place])   . Gallstones   . Headache(784.0)   . Hepatitis C   . Hypercholesteremia   . Hypertension   . Left leg paresthesias   . Myocardial infarction ([place])   . Stroke [facility])    TIA  . Vertigo     Past Surgical History:  Procedure Laterality Date  . BYPASS GRAFT     triple bypass surgery  . CARDIAC SURGERY    . CORONARY ARTERY BYPASS GRAFT    . HAND SURGERY  1984  . ORTHOPEDIC SURGERY     "on my legs"    Prior to Admission medications   Medication Sig Start Date End Date Taking? Authorizing Provider  amLODipine (NORVASC) 10 MG tablet Take 5 mg by mouth daily.   Yes [provider]  Calcium Carbonate-Vitamin D (CALCIUM 600+D) 600-400 MG-UNIT tablet Take 1 tablet by mouth daily.   Yes [provider]  carvedilol (COREG) 25 MG tablet Take 25 mg by mouth 2 (two) times daily with a meal.   Yes [provider]  cetirizine (ZYRTEC) 10 MG tablet Take 10 mg by mouth 2 (two) times daily.    Yes [provider]  Cholecalciferol (VITAMIN D3) 1000 units CAPS Take 1,000 Units by mouth daily.    Yes [provider]  clobetasol ointment (TEMOVATE) 2.54 % Apply 1 application topically 2 (two) times daily.   Yes [provider]  colchicine 0.6 MG tablet Take 0.6 mg by mouth as needed (If wrist flares, take 0.6 mg twice daily for 4 days to see if  there is improvement.).    Yes [provider]  finasteride (PROSCAR) 5 MG tablet Take 5 mg by mouth daily.   Yes [provider]  fluticasone (FLONASE) 50 MCG/ACT nasal spray Place 2 sprays into both nostrils daily.    Yes [provider]  gabapentin (NEURONTIN) 300 MG capsule Take 900 mg by mouth 3 (three) times daily.    Yes [provider]  hydrocerin (EUCERIN) CREA Apply 1 application topically 2 (two) times daily.   Yes [provider]  insulin aspart (NOVOLOG) 100 UNIT/ML FlexPen Inject 10 Units into the skin 3 (three) times daily with meals.    Yes [provider]  insulin glargine (LANTUS) 100 UNIT/ML injection Inject 40 Units into the skin at bedtime.    Yes [provider]  magnesium oxide (MAG-OX) 400 MG tablet Take 400 mg by mouth daily.   Yes [provider]  metFORMIN (GLUCOPHAGE) 500 MG tablet Take 500 mg by mouth daily with breakfast.    Yes [provider]  mirtazapine (REMERON) 7.5 MG tablet Take 7.5 mg by mouth at bedtime.   Yes [provider]  oxybutynin (DITROPAN) 5 MG tablet Take 5 mg by mouth 4 (four) times daily.    Yes [provider]  polyvinyl alcohol (LIQUIFILM TEARS) 1.4 % ophthalmic solution Place 1 drop into both eyes 4 (four) times daily as needed for dry eyes.   Yes [provider]  terazosin (HYTRIN) 2 MG capsule Take 4 mg by mouth at bedtime.   Yes [provider]  venlafaxine (EFFEXOR) 25 MG tablet Take 25 mg by mouth 2 (two) times daily.   Yes [provider]  albuterol (PROVENTIL HFA;VENTOLIN HFA) 108 (90 Base) MCG/ACT inhaler Inhale 2 puffs into the lungs 3 (three) times daily as needed for wheezing or shortness of breath.    [provider]  atorvastatin (LIPITOR) 20 MG tablet Take 10 mg by mouth at bedtime.     [provider]  Glucose Blood (PRECISION XTRA BLOOD GLUCOSE VI) by In Vitro route.    [provider]  isosorbide mononitrate (IMDUR) 60 MG 24 hr tablet Take 60 mg by mouth daily.     [provider]  naloxone [facility]) nasal spray 4 mg/0.1 mL Place 1 spray into the nose.    [provider]    Allergies as of 02/04/2017 - Review Complete 02/04/2017  Allergen Reaction Noted  . Bee venom Anaphylaxis and Swelling 06/13/2011  . Questran [cholestyramine]  10/01/2016    Family History  Problem Relation Age of Onset  . Diabetes Brother   . Diabetes Sister   . Diabetes Father   . Hypertension Father   . Diabetes Mother   . Hypertension Mother   . Hypertension Sister   . Hypertension Brother     Social History   Socioeconomic History  . Marital status: Legally Separated    Spouse name: Not on file  . Number of children: Not on file  . Years of education: Not on file  . Highest education level: Not on file  Social Needs  . Financial resource strain: Not on file  . Food insecurity - worry: Not on file  . Food insecurity - inability: Not on file  . Transportation needs - medical: Not on file  . Transportation needs - non-medical: Not on file  Occupational History  . Not on file  Tobacco Use  . Smoking status: Former Smoker    Packs/day: 1.00    Years: 35.00    Pack years: 35.00    Last attempt to quit: 01/25/2009    Years since quitting: 8.0  . Smokeless tobacco: Never Used  Substance and Sexual Activity  . Alcohol use: No    Comment: former alcoholic  . Drug use: No    Comment: former cocaine abuse  . Sexual activity: Not on file  Other Topics Concern  . Not on file  Social History Narrative  . Not on file    Review of Systems: See HPI, otherwise negative ROS  Physical Exam: BP (!) 139/94   Pulse 67   Temp 97.7 F (36.5 C) (Temporal)   Resp 17   Ht 6\' 3"  (1.905 m)   Wt 245 lb (111.1 kg)   SpO2 97%   BMI 30.62 kg/m  General:   Alert,  pleasant and cooperative in NAD Head:  Normocephalic and atraumatic. Neck:  Supple; no  masses or thyromegaly. Lungs:  Clear throughout to auscultation, normal respiratory effort.    Heart:  +S1, +S2, Regular rate and rhythm, No edema. Abdomen:  Soft, nontender and nondistended. Normal bowel sounds, without guarding, and without rebound.   Neurologic:  Alert and  oriented x4;  grossly normal neurologically.  Impression/Plan: Arvid Right  Levario is here for an colonoscopy to be performed for CRC screening. EGD for variceal screening   Risks, benefits, limitations, and alternatives regarding  colonoscopy have been reviewed with the patient.  Questions have been answered.  All parties agreeable.   Virgel Manifold, MD  02/15/2017, 11:09 AM

## 2017-02-15 NOTE — Anesthesia Postprocedure Evaluation (Signed)
Anesthesia Post Note  Patient: [name]  Procedure(s) Performed: COLONOSCOPY WITH PROPOFOL (N/A Rectum) ESOPHAGOGASTRODUODENOSCOPY (EGD) WITH PROPOFOL (N/A Throat)  Patient location during evaluation: PACU Anesthesia Type: General Level of consciousness: awake and alert Pain management: pain level controlled Vital Signs Assessment: post-procedure vital signs reviewed and stable Respiratory status: spontaneous breathing, nonlabored ventilation, respiratory function stable and patient connected to nasal cannula oxygen Cardiovascular status: blood pressure returned to baseline and stable Postop Assessment: no apparent nausea or vomiting Anesthetic complications: no    Anabelen Kaminsky ELAINE

## 2017-02-15 NOTE — Transfer of Care (Signed)
Immediate Anesthesia Transfer of Care Note  Patient: [name]  Procedure(s) Performed: COLONOSCOPY WITH PROPOFOL (N/A Rectum) ESOPHAGOGASTRODUODENOSCOPY (EGD) WITH PROPOFOL (N/A Throat)  Patient Location: PACU  Anesthesia Type: General  Level of Consciousness: awake, alert  and patient cooperative  Airway and Oxygen Therapy: Patient Spontanous Breathing and Patient connected to supplemental oxygen  Post-op Assessment: Post-op Vital signs reviewed, Patient's Cardiovascular Status Stable, Respiratory Function Stable, Patent Airway and No signs of Nausea or vomiting  Post-op Vital Signs: Reviewed and stable  Complications: No apparent anesthesia complications

## 2017-02-15 NOTE — Op Note (Signed)
[facility]enter[facility] Patient Name: [name] Procedure Date: 02/15/2017 11:44 AM MRN: [id number] Account #: [id number] Date of Birth: August 09, 1960 Admit Type: Outpatient Age: 57 Room: [facility] OR ROOM 01 Gender: Male Note Status: Finalized Procedure:            Colonoscopy Indications:          Screening for colorectal malignant neoplasm Providers:            Tore Carreker B. Bonna Gains MD, MD Referring MD:         [facility], MD (Referring MD) Medicines:            Monitored Anesthesia Care Complications:        No immediate complications. Procedure:            Pre-Anesthesia Assessment:                       - ASA Grade Assessment: II - A patient with mild                        systemic disease.                       - Prior to the procedure, a History and Physical was                        performed, and patient medications, allergies and                        sensitivities were reviewed. The patient's tolerance of                        previous anesthesia was reviewed.                       After obtaining informed consent, the colonoscope was                        passed under direct vision. Throughout the procedure,                        the patient's blood pressure, pulse, and oxygen                        saturations were monitored continuously. The Olympus                        CF-HQ190L Colonoscope (S#. [id number]) was introduced                        through the anus and advanced to the the cecum,                        identified by appendiceal orifice and ileocecal valve.                        The colonoscopy was performed with ease. The patient                        tolerated the procedure well. The quality of the bowel  preparation was fair and fair and a lot of water and                        suctioning was used to clean the colon. Findings:      The perianal and digital rectal examinations were  normal.      A localized area of mildly erythematous mucosa was found in the rectum.       This was biopsied with a cold forceps for histology. This could       represent scope or barotrauma      The rectum, sigmoid colon, descending colon, transverse colon, ascending       colon and cecum appeared otherwise normal.      The entire examined colon appeared normal.      The retroflexed view of the distal rectum and anal verge was normal and       showed no anal or rectal abnormalities. Impression:           - Preparation of the colon was fair.                       - Erythematous mucosa in the rectum. Biopsied.                       - The rectum, sigmoid colon, descending colon,                        transverse colon, ascending colon and cecum are normal                        otherwise.                       - The entire examined colon is normal.                       - The distal rectum and anal verge are normal on                        retroflexion view. Recommendation:       - Discharge patient to home (with escort).                       - Advance diet as tolerated.                       - Continue present medications.                       - Repeat colonoscopy in 5 years for screening purposes                        due to fair prep on today's exam.                       - The findings and recommendations were discussed with                        the patient.                       - The findings and recommendations were discussed with  the patient's family.                       - Return to primary care physician as previously                        scheduled.                       - Await pathology results. Procedure Code(s):    --- Professional ---                       [phone], Colonoscopy, flexible; with biopsy, single or                        multiple Diagnosis Code(s):    --- Professional ---                       Z12.11, Encounter for screening for  malignant neoplasm                        of colon                       K62.89, Other specified diseases of anus and rectum CPT copyright 2016 American Medical Association. All rights reserved. The codes documented in this report are preliminary and upon coder review may  be revised to meet current compliance requirements.  Vonda Antigua, MD Margretta Sidle B. Bonna Gains MD, MD 02/15/2017 12:28:51 PM This report has been signed electronically. Number of Addenda: 0 Note Initiated On: 02/15/2017 11:44 AM Scope Withdrawal Time: 0 hours 21 minutes 54 seconds  Total Procedure Duration: 0 hours 36 minutes 41 seconds  Estimated Blood Loss: Estimated blood loss: none.      [facility]

## 2017-02-17 ENCOUNTER — Encounter: Payer: Self-pay | Admitting: Gastroenterology

## 2017-02-18 ENCOUNTER — Encounter: Payer: Self-pay | Admitting: Gastroenterology

## 2017-02-18 NOTE — Progress Notes (Signed)
Pt. Is Child Pugh Class A, and 1 small varix without red wale sign (high risk features) was seen on his EGD. As per uptodate "we follow patients with Child A cirrhosis who have small varices without red signs with routine upper endoscopy to monitor for the development of red signs or for variceal enlargement." Pt. Does not have previous episodes of variceal bleeding and for primary prophylaxis routine upper endoscopy can be performed to evaluate his varices in the future.

## 2017-02-21 ENCOUNTER — Other Ambulatory Visit: Payer: Self-pay

## 2017-02-21 NOTE — Progress Notes (Signed)
Opened in error

## 2017-03-03 ENCOUNTER — Encounter: Payer: Self-pay | Admitting: Gastroenterology

## 2017-03-03 ENCOUNTER — Ambulatory Visit: Payer: Non-veteran care | Admitting: Gastroenterology

## 2017-03-03 ENCOUNTER — Other Ambulatory Visit: Payer: Self-pay

## 2017-03-03 VITALS — BP 146/87 | HR 65 | Ht 75.0 in | Wt 256.6 lb

## 2017-03-03 DIAGNOSIS — K7460 Unspecified cirrhosis of liver: Secondary | ICD-10-CM | POA: Diagnosis not present

## 2017-03-03 DIAGNOSIS — K7030 Alcoholic cirrhosis of liver without ascites: Secondary | ICD-10-CM | POA: Diagnosis not present

## 2017-03-03 NOTE — Progress Notes (Signed)
Vonda Antigua, MD [address]  [place]  [place], [place] [address]  Main: [phone]  Fax: [phone]   Primary Care Physician: [facility]  Primary Gastroenterologist:  Dr. Vonda Antigua  Chief Complaint  Patient presents with  . Follow-up    discuss beta blocker    HPI: [name] is a 57 y.o. 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  Patient reports doing very well, no abdominal distention, no confusion, no episodes of bleeding, no weight loss or abdominal pain.  No altered bowel habits, no blood in stool or melena.  His last EGD was in February 15, 2017 for variceal screening and showed 1 isolated grade 2 varix with no stigmata of recent bleeding.  Due to its small size, it was not amenable to banding.  One small island, 0.5 cm, of salmon colored mucosa was seen at the GE junction.  Gastric erythema was noted.  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  Squamous and gastric mucosa was reported on the biopsy results.  Colonoscopy was on August 15, 2017 as well and was normal except for a fair prep.  Rectal mucosa showed some erythema, which was thought to be due to barotrauma, biopsies were normal.  Repeat recommended in 5 years due to fair prep.  As per previous notes, patient was initially seen on February 04, 2017, as he was referred for hard stool bright red blood per rectum at the time, isolated episode which resolved spontaneously. "Patient has had a colonoscopy 10 years ago and he does not know the results of it.  He is a [place] patient and his previous endoscopy reports are not available to [place]. "  "Incidentally, his lab work shows thrombocytopenia with platelets of 118.  An ultrasound in 2014 on epic reports evidence of cirrhosis.  Patient states he has been told in the past that he has cirrhosis but has not  had any EGDs for variceal screening.  Reports previous history of heavy alcohol use for years, has not had a drink in 12 years.  Has never had a paracentesis.  No episodes of confusion.  Denies abdominal distention.  States he thinks he was seeing GI at the [place]."   Current Outpatient Medications  Medication Sig Dispense Refill  . albuterol (PROVENTIL HFA;VENTOLIN HFA) 108 (90 Base) MCG/ACT inhaler Inhale 2 puffs into the lungs 3 (three) times daily as needed for wheezing or shortness of breath.    Marland Kitchen amLODipine (NORVASC) 10 MG tablet Take 5 mg by mouth daily.    Marland Kitchen atorvastatin (LIPITOR) 20 MG tablet Take 10 mg by mouth at bedtime.     . Calcium Carbonate-Vitamin D (CALCIUM 600+D) 600-400 MG-UNIT tablet Take 1 tablet by mouth daily.    . carvedilol (COREG) 25 MG tablet Take 25 mg by mouth 2 (two) times daily with a meal.    . cetirizine (ZYRTEC) 10 MG tablet Take 10 mg by mouth 2 (two) times daily.     . Cholecalciferol (VITAMIN D3) 1000 units CAPS Take 1,000 Units by mouth daily.     . clobetasol ointment (TEMOVATE) 0.10 % Apply 1 application topically 2 (two) times daily.    . colchicine 0.6 MG tablet Take 0.6 mg by mouth as needed (If wrist flares, take 0.6 mg twice daily for 4 days to see if there is improvement.).     Marland Kitchen finasteride (PROSCAR)  5 MG tablet Take 5 mg by mouth daily.    . fluticasone (FLONASE) 50 MCG/ACT nasal spray Place 2 sprays into both nostrils daily.     Marland Kitchen gabapentin (NEURONTIN) 300 MG capsule Take 900 mg by mouth 3 (three) times daily.     . Glucose Blood (PRECISION XTRA BLOOD GLUCOSE VI) by In Vitro route.    . hydrocerin (EUCERIN) CREA Apply 1 application topically 2 (two) times daily.    . insulin aspart (NOVOLOG) 100 UNIT/ML FlexPen Inject 10 Units into the skin 3 (three) times daily with meals.     . insulin glargine (LANTUS) 100 UNIT/ML injection Inject 40 Units into the skin at bedtime.     . isosorbide mononitrate (IMDUR) 60 MG 24 hr tablet Take 60 mg by mouth daily.      . magnesium oxide (MAG-OX) 400 MG tablet Take 400 mg by mouth daily.    . metFORMIN (GLUCOPHAGE) 500 MG tablet Take 500 mg by mouth daily with breakfast.     . mirtazapine (REMERON) 7.5 MG tablet Take 7.5 mg by mouth at bedtime.    . naloxone (NARCAN) nasal spray 4 mg/0.1 mL Place 1 spray into the nose.    . oxybutynin (DITROPAN) 5 MG tablet Take 5 mg by mouth 4 (four) times daily.     . polyvinyl alcohol (LIQUIFILM TEARS) 1.4 % ophthalmic solution Place 1 drop into both eyes 4 (four) times daily as needed for dry eyes.    Marland Kitchen terazosin (HYTRIN) 2 MG capsule Take 4 mg by mouth at bedtime.    Marland Kitchen venlafaxine (EFFEXOR) 25 MG tablet Take 25 mg by mouth 2 (two) times daily.     Current Facility-Administered Medications  Medication Dose Route Frequency Provider Last Rate Last Dose  . betamethasone acetate-betamethasone sodium phosphate (CELESTONE) injection 3 mg  3 mg Intramuscular Once Edrick Kins, DPM        Allergies as of 03/03/2017 - Review Complete 03/03/2017  Allergen Reaction Noted  . Bee venom Anaphylaxis and Swelling 06/13/2011  . Questran [cholestyramine]  10/01/2016    ROS:  General: Negative for anorexia, weight loss, fever, chills, fatigue, weakness. ENT: Negative for hoarseness, difficulty swallowing , nasal congestion. CV: Negative for chest pain, angina, palpitations, dyspnea on exertion, peripheral edema.  Respiratory: Negative for dyspnea at rest, dyspnea on exertion, cough, sputum, wheezing.  GI: See history of present illness. GU:  Negative for dysuria, hematuria, urinary incontinence, urinary frequency, nocturnal urination.  Endo: Negative for unusual weight change.    Physical Examination:   BP (!) 146/87   Pulse 65   Ht 6\' 3"  (1.905 m)   Wt 256 lb 9.6 oz (116.4 kg)   BMI 32.07 kg/m   General: Well-nourished, well-developed in no acute distress.  Eyes: No icterus. Conjunctivae pink. Mouth: Oropharyngeal mucosa moist and pink , no lesions erythema or  exudate. Lungs: Clear to auscultation bilaterally. Non-labored. Heart: Regular rate and rhythm, no murmurs rubs or gallops.  Abdomen: Bowel sounds are normal, nontender, nondistended, no hepatosplenomegaly or masses, no abdominal bruits or hernia , no rebound or guarding.   Extremities: No lower extremity edema. No clubbing or deformities. Neuro: Alert and oriented x 3.  Grossly intact. Skin: Warm and dry, no jaundice.   Psych: Alert and cooperative, normal mood and affect.   Labs: CMP     Component Value Date/Time   NA 139 02/04/2017 1241   K 3.7 02/04/2017 1241   CL 108 02/04/2017 1241   CO2 22 02/04/2017  1241   GLUCOSE 121 (H) 02/04/2017 1241   BUN 11 02/04/2017 1241   CREATININE 0.94 02/04/2017 1241   CALCIUM 9.2 02/04/2017 1241   PROT 7.2 02/04/2017 1241   ALBUMIN 4.0 02/04/2017 1241   AST 46 (H) 02/04/2017 1241   ALT 46 02/04/2017 1241   ALKPHOS 95 02/04/2017 1241   BILITOT 1.1 02/04/2017 1241   GFRNONAA >60 02/04/2017 1241   GFRAA >60 02/04/2017 1241   Lab Results  Component Value Date   WBC 8.2 02/04/2017   HGB 15.5 02/04/2017   HCT 46.7 02/04/2017   MCV 87.1 02/04/2017   PLT 163 02/04/2017    Imaging Studies: No results found.  Assessment and Plan:   [name] is a 57 y.o. y/o male with history of alcohol and hepatitis C cirrhosis here for follow-up  Cirrhosis Well compensated Meld 9 on February 04, 2017 labs No alcohol use for 12 years Continue to abstain from alcohol or hepatotoxic drugs.  Patient verbalized understanding of this recommendation No previous or recent episodes of bleeding, or confusion Patient states he has been immunized against hepatitis A and B by the [place].  States he received a flu shot this year as well. Will obtain right upper quadrant ultrasound to rule out any liver lesions  Varix seen on EGD No stigmata of recent bleeding No previous episodes of bleeding Patient is on carvedilol twice daily for her history of  CAD Heart rate is in the low 60s Thus, we will not add any nadolol or propranolol at this time, given that he is already, and adding any other medication would increase risks of dizziness, hypotension, and would outweigh any benefits at this time. Patient was asked to go the ER if he develops any signs of active GI bleeding.  He verbalized understanding. Repeat EGD in 2-3 years for variceal surveillance  Hepatitis C Last RNA quant on February 04, 2017 was undetectable  CRC screening Next one due in January 2024  Record release was sent to the [place] on his last visit to obtain previous records.  We have not obtained any records so far.  We will retry obtaining records.  Patient states he is going to the [facility] today and will request for them to send [place] records as well.  Dr Vonda Antigua

## 2017-03-03 NOTE — Patient Instructions (Signed)
F/U 6 months Given previous copy of medical release for [facility] and another medical release form if it is needed.

## 2017-03-04 ENCOUNTER — Telehealth: Payer: Self-pay

## 2017-03-04 NOTE — Telephone Encounter (Signed)
Left message that ultrasound scheduled for 03/08/17 at 11:00am, to arrive at 10:45am at [facility] on [address]. Nothing to eat or drink after midnight. Contact office if any questions.

## 2017-03-07 ENCOUNTER — Telehealth: Payer: Self-pay | Admitting: Gastroenterology

## 2017-03-07 ENCOUNTER — Telehealth: Payer: Self-pay

## 2017-03-07 NOTE — Telephone Encounter (Signed)
Left vm letting pt know, per the pre service center, prior authorization is required for both the [place] and Medicaid. I have contacted the [facility] to inquire about obtaining the authorization and she stated the pt was suppose to have contacted his PCP at the [place] to request additional services when he left the ER to have the authorization faxed to our office. I have left this detailed information on the pt's voicemail stating this information as well as informing him his RUQ [place] will be cancelled if we do not receive.

## 2017-03-07 NOTE — Telephone Encounter (Signed)
Patient called and stated that he doesn't want to go with the [place] only Medicaid. I explained to him from [facility] note that he needs to get auth from both. He stated he is going to the [place] tomorrow for authorization and will call Medicaid. He understands his [place] will be canceled if he doesn't get it.

## 2017-03-08 ENCOUNTER — Ambulatory Visit: Payer: Non-veteran care

## 2017-03-14 ENCOUNTER — Telehealth: Payer: Self-pay

## 2017-03-14 NOTE — Telephone Encounter (Signed)
-----   Message from Virgel Manifold, MD sent at 02/18/2017  3:39 PM EST ----- Jackelyn Poling,   Can you please order a Liver ultrasound for the patient. Indication:Cirrhosis, rule out liver lesions. Let the patient know it is for routine follow up of his cirrhosis and to rule out any underlying liver lesions.   He can follow up with me as scheduled on the 7th or he can choose to reschedule and see me in 8-12 weeks. Since his varix seen on EGD was small and his cirrhosis is not decompensated we do not need to start new medication for his varices at this time, so there is no urgency to see him. He should just get his Liver U/S.  Also please ask him to follow up with his PCP and make sure he gets his flu, and pneumonia vaccine.

## 2017-03-14 NOTE — Telephone Encounter (Signed)
See previous telephone messages.

## 2017-04-11 ENCOUNTER — Other Ambulatory Visit: Payer: Self-pay

## 2017-04-11 ENCOUNTER — Encounter: Payer: Self-pay | Admitting: Gastroenterology

## 2017-04-11 ENCOUNTER — Encounter (INDEPENDENT_AMBULATORY_CARE_PROVIDER_SITE_OTHER): Payer: Self-pay

## 2017-04-11 ENCOUNTER — Ambulatory Visit: Payer: Non-veteran care | Admitting: Gastroenterology

## 2017-04-11 VITALS — BP 135/82 | HR 66 | Temp 98.3°F | Ht 75.0 in | Wt 254.4 lb

## 2017-04-11 DIAGNOSIS — K7030 Alcoholic cirrhosis of liver without ascites: Secondary | ICD-10-CM

## 2017-04-11 MED ORDER — LACTULOSE 10 G PO PACK: 10.0000 g | PACK | Freq: Every day | ORAL | 3 refills | Status: AC

## 2017-04-11 NOTE — Patient Instructions (Signed)
F/u 3 months Pt working on getting approval for his ultrasound.

## 2017-04-11 NOTE — Progress Notes (Signed)
Vonda Antigua, MD [address]  [place]  [place], [place] [address]  Main: [phone]  Fax: [phone]   Primary Care Physician: [facility]  Primary Gastroenterologist:  Dr. Vonda Antigua  Chief Complaint  Patient presents with  . Follow-up  Follow-up for cirrhosis  HPI: [name] is a 56 y.o. 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  Denies any abdominal pain, no episodes of confusion, reports intermittent bright red blood per rectum only on the toilet paper.  Has occurred 1-2 times.  Tolerating oral diet, no abdominal distention, no lower extremity edema.  As per previous notes: "His last EGD was in February 15, 2017 for variceal screening and showed 1 isolated grade 2 varix with no stigmata of recent bleeding.  Due to its small size, it was not amenable to banding.  One small island, 0.5 cm, of salmon colored mucosa was seen at the GE junction.  Gastric erythema was noted.  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  Squamous and gastric mucosa was reported on the biopsy results.  Colonoscopy was on Feb 15, 2017 as well and was normal except for a fair prep.  Rectal mucosa showed some erythema, which was thought to be due to barotrauma, biopsies were normal.  Repeat recommended in 5 years due to fair prep.  As per previous notes, patient was initially seen on February 04, 2017, as he was referred for hard stool bright red blood per rectum at the time, isolated episode which resolved spontaneously. "Patient has had a colonoscopy 10 years ago and he does not know the results of it. He is a [place] patient and his previous endoscopy reports are not available to [place]. "  "Incidentally, his lab work shows thrombocytopenia with platelets of 118. An ultrasound in 2014 on epic reports  evidence of cirrhosis. Patient states he has been told in the past that he has cirrhosis but has not had any EGDs for variceal screening. Reports previous history of heavy alcohol use for years, has not had a drink in 12 years. Has never had a paracentesis. No episodes of confusion. Denies abdominal distention. States he thinks he was seeing GI at the [place]."    Current Outpatient Medications  Medication Sig Dispense Refill  . albuterol (PROVENTIL HFA;VENTOLIN HFA) 108 (90 Base) MCG/ACT inhaler Inhale 2 puffs into the lungs 3 (three) times daily as needed for wheezing or shortness of breath.    Marland Kitchen amLODipine (NORVASC) 10 MG tablet Take 5 mg by mouth daily.    Marland Kitchen atorvastatin (LIPITOR) 20 MG tablet Take 10 mg by mouth at bedtime.     . Calcium Carbonate-Vitamin D (CALCIUM 600+D) 600-400 MG-UNIT tablet Take 1 tablet by mouth daily.    . carvedilol (COREG) 25 MG tablet Take 25 mg by mouth 2 (two) times daily with a meal.    . cetirizine (ZYRTEC) 10 MG tablet Take 10 mg by mouth 2 (two) times daily.     . Cholecalciferol (VITAMIN D3) 1000 units CAPS Take 1,000 Units by mouth daily.     . clobetasol ointment (TEMOVATE) 3.97 % Apply 1 application topically 2 (two) times daily.    . colchicine 0.6 MG tablet Take 0.6 mg by mouth as needed (If wrist flares, take 0.6 mg twice daily for 4 days to see if there is improvement.).     Marland Kitchen  finasteride (PROSCAR) 5 MG tablet Take 5 mg by mouth daily.    . fluticasone (FLONASE) 50 MCG/ACT nasal spray Place 2 sprays into both nostrils daily.     Marland Kitchen gabapentin (NEURONTIN) 300 MG capsule Take 900 mg by mouth 3 (three) times daily.     . Glucose Blood (PRECISION XTRA BLOOD GLUCOSE VI) by In Vitro route.    . hydrocerin (EUCERIN) CREA Apply 1 application topically 2 (two) times daily.    . insulin aspart (NOVOLOG) 100 UNIT/ML FlexPen Inject 10 Units into the skin 3 (three) times daily with meals.     . insulin glargine (LANTUS) 100 UNIT/ML injection Inject 40 Units into  the skin at bedtime.     . isosorbide mononitrate (IMDUR) 60 MG 24 hr tablet Take 60 mg by mouth daily.     . magnesium oxide (MAG-OX) 400 MG tablet Take 400 mg by mouth daily.    . metFORMIN (GLUCOPHAGE) 500 MG tablet Take 500 mg by mouth daily with breakfast.     . mirtazapine (REMERON) 7.5 MG tablet Take 7.5 mg by mouth at bedtime.    . naloxone (NARCAN) nasal spray 4 mg/0.1 mL Place 1 spray into the nose.    . oxybutynin (DITROPAN) 5 MG tablet Take 5 mg by mouth 4 (four) times daily.     . polyvinyl alcohol (LIQUIFILM TEARS) 1.4 % ophthalmic solution Place 1 drop into both eyes 4 (four) times daily as needed for dry eyes.    Marland Kitchen terazosin (HYTRIN) 2 MG capsule Take 4 mg by mouth at bedtime.    Marland Kitchen venlafaxine (EFFEXOR) 25 MG tablet Take 25 mg by mouth 2 (two) times daily.     Current Facility-Administered Medications  Medication Dose Route Frequency Provider Last Rate Last Dose  . betamethasone acetate-betamethasone sodium phosphate (CELESTONE) injection 3 mg  3 mg Intramuscular Once Edrick Kins, DPM        Allergies as of 04/11/2017 - Review Complete 04/11/2017  Allergen Reaction Noted  . Bee venom Anaphylaxis and Swelling 06/13/2011  . Questran [cholestyramine]  10/01/2016    ROS:  General: Negative for anorexia, weight loss, fever, chills, fatigue, weakness. ENT: Negative for hoarseness, difficulty swallowing , nasal congestion. CV: Negative for chest pain, angina, palpitations, dyspnea on exertion, peripheral edema.  Respiratory: Negative for dyspnea at rest, dyspnea on exertion, cough, sputum, wheezing.  GI: See history of present illness. GU:  Negative for dysuria, hematuria, urinary incontinence, urinary frequency, nocturnal urination.  Endo: Negative for unusual weight change.    Physical Examination:   BP 135/82   Pulse 66   Temp 98.3 F (36.8 C) (Oral)   Ht 6\' 3"  (1.905 m)   Wt 254 lb 6.4 oz (115.4 kg)   BMI 31.80 kg/m   General: Well-nourished, well-developed  in no acute distress.  Eyes: No icterus. Conjunctivae pink. Mouth: Oropharyngeal mucosa moist and pink , no lesions erythema or exudate. Neck: Supple, Trachea midline Abdomen: Bowel sounds are normal, nontender, nondistended, no hepatosplenomegaly or masses, no abdominal bruits or hernia , no rebound or guarding.   Extremities: No lower extremity edema. No clubbing or deformities. Neuro: Alert and oriented x 3.  Grossly intact. Skin: Warm and dry, no jaundice.   Psych: Alert and cooperative, normal mood and affect.   Labs: CMP     Component Value Date/Time   NA 139 02/04/2017 1241   K 3.7 02/04/2017 1241   CL 108 02/04/2017 1241   CO2 22 02/04/2017 1241   GLUCOSE  121 (H) 02/04/2017 1241   BUN 11 02/04/2017 1241   CREATININE 0.94 02/04/2017 1241   CALCIUM 9.2 02/04/2017 1241   PROT 7.2 02/04/2017 1241   ALBUMIN 4.0 02/04/2017 1241   AST 46 (H) 02/04/2017 1241   ALT 46 02/04/2017 1241   ALKPHOS 95 02/04/2017 1241   BILITOT 1.1 02/04/2017 1241   GFRNONAA >60 02/04/2017 1241   GFRAA >60 02/04/2017 1241   Lab Results  Component Value Date   WBC 8.2 02/04/2017   HGB 15.5 02/04/2017   HCT 46.7 02/04/2017   MCV 87.1 02/04/2017   PLT 163 02/04/2017    Imaging Studies: No results found.  Assessment and Plan:   [name] is a 57 y.o. y/o male here for follow-up of alcohol and hepatitis C cirrhosis  Cirrhosis Well compensated Meld 9 on February 04, 2017 labs No alcohol use for 12 years Continue to abstain from alcohol or hepatotoxic drugs, including herbal supplements.  Patient verbalized understanding is not using any of these at this time.  No previous or recent episodes of bleeding or confusion Patient states he has been immunized against hepatitis a and B by the [place].  States he received his flu shot this year as well. Right upper quadrant ultrasound was ordered last time, but patient is still awaiting approval from the [place].  He is on the phone with them today to  get this scheduled as well.  He was asked to follow-up on this closely and get the ultrasound scheduled as soon as possible.  He verbalized understanding we will check AFP At this time  Salmon colored islands seen in esophagus on last EGD Biopsies did not show any intestinal metaplasia During variceal screening surveillance in 2-3 years, can reexamine area, and if present obtain biopsies again. No symptoms of heartburn present  Varix seen on EGD No stigmata of recent bleeding No previous episodes of bleeding Patient is on carvedilol twice daily for CAD, which will help with portal hypertension as well. Repeat EGD for variceal surveillance in 2-3 years  Hepatitis C Last RNA quant on February 04, 2017 was undetectable  CRC screening Next one due in January 2024, 2-day prep needed at that time his last prep was fair.  Follow-up in 3 months  Dr Vonda Antigua

## 2017-04-28 ENCOUNTER — Other Ambulatory Visit: Payer: Self-pay | Admitting: Cardiovascular Disease

## 2017-04-28 DIAGNOSIS — I200 Unstable angina: Secondary | ICD-10-CM | POA: Insufficient documentation

## 2017-04-29 ENCOUNTER — Encounter
Admission: RE | Disposition: A | Discharge status: Home / Self Care | Payer: Self-pay | Source: Ambulatory Visit | Attending: Cardiovascular Disease

## 2017-04-29 ENCOUNTER — Ambulatory Visit
Admission: RE | Admit: 2017-04-29 | Discharge: 2017-04-29 | Disposition: A | Discharge status: Home / Self Care | Payer: Non-veteran care | Source: Ambulatory Visit | Attending: Cardiovascular Disease | Admitting: Cardiovascular Disease

## 2017-04-29 DIAGNOSIS — I4510 Unspecified right bundle-branch block: Secondary | ICD-10-CM | POA: Insufficient documentation

## 2017-04-29 DIAGNOSIS — Z87891 Personal history of nicotine dependence: Secondary | ICD-10-CM | POA: Insufficient documentation

## 2017-04-29 DIAGNOSIS — E785 Hyperlipidemia, unspecified: Secondary | ICD-10-CM | POA: Insufficient documentation

## 2017-04-29 DIAGNOSIS — Z8249 Family history of ischemic heart disease and other diseases of the circulatory system: Secondary | ICD-10-CM | POA: Insufficient documentation

## 2017-04-29 DIAGNOSIS — I25710 Atherosclerosis of autologous vein coronary artery bypass graft(s) with unstable angina pectoris: Secondary | ICD-10-CM | POA: Insufficient documentation

## 2017-04-29 DIAGNOSIS — I10 Essential (primary) hypertension: Secondary | ICD-10-CM | POA: Insufficient documentation

## 2017-04-29 DIAGNOSIS — I200 Unstable angina: Secondary | ICD-10-CM | POA: Diagnosis present

## 2017-04-29 DIAGNOSIS — I2582 Chronic total occlusion of coronary artery: Secondary | ICD-10-CM | POA: Diagnosis not present

## 2017-04-29 DIAGNOSIS — F329 Major depressive disorder, single episode, unspecified: Secondary | ICD-10-CM | POA: Diagnosis not present

## 2017-04-29 HISTORY — PX: LEFT HEART CATH AND CORONARY ANGIOGRAPHY: CATH118249

## 2017-04-29 SURGERY — LEFT HEART CATH AND CORONARY ANGIOGRAPHY
Anesthesia: Moderate Sedation | Laterality: Left

## 2017-04-29 MED ORDER — SODIUM CHLORIDE 0.9 % WEIGHT BASED INFUSION: 1.0000 mL/kg/h | INTRAVENOUS | Status: DC

## 2017-04-29 MED ORDER — ACETAMINOPHEN 325 MG PO TABS: 650.0000 mg | ORAL_TABLET | ORAL | Status: DC | PRN

## 2017-04-29 MED ORDER — ASPIRIN 81 MG PO CHEW
81.0000 mg | CHEWABLE_TABLET | ORAL | Status: AC
  Administered 2017-04-29: 81 mg via ORAL

## 2017-04-29 MED ORDER — SODIUM CHLORIDE 0.9 % IV SOLN: 250.0000 mL | INTRAVENOUS | Status: DC | PRN

## 2017-04-29 MED ORDER — SODIUM CHLORIDE 0.9% FLUSH: 3.0000 mL | Freq: Two times a day (BID) | INTRAVENOUS | Status: DC

## 2017-04-29 MED ORDER — FENTANYL CITRATE (PF) 100 MCG/2ML IJ SOLN
INTRAMUSCULAR | Status: AC
  Filled 2017-04-29: qty 2

## 2017-04-29 MED ORDER — MIDAZOLAM HCL 2 MG/2ML IJ SOLN
INTRAMUSCULAR | Status: DC | PRN
  Administered 2017-04-29: 1 mg via INTRAVENOUS

## 2017-04-29 MED ORDER — SODIUM CHLORIDE 0.9 % WEIGHT BASED INFUSION
3.0000 mL/kg/h | INTRAVENOUS | Status: AC
  Administered 2017-04-29: 3 mL/kg/h via INTRAVENOUS

## 2017-04-29 MED ORDER — IOPAMIDOL (ISOVUE-300) INJECTION 61%
INTRAVENOUS | Status: DC | PRN
  Administered 2017-04-29: 130 mL via INTRA_ARTERIAL

## 2017-04-29 MED ORDER — SODIUM CHLORIDE 0.9% FLUSH: 3.0000 mL | INTRAVENOUS | Status: DC | PRN

## 2017-04-29 MED ORDER — FENTANYL CITRATE (PF) 100 MCG/2ML IJ SOLN
INTRAMUSCULAR | Status: DC | PRN
  Administered 2017-04-29: 50 ug via INTRAVENOUS

## 2017-04-29 MED ORDER — ASPIRIN 81 MG PO CHEW
CHEWABLE_TABLET | ORAL | Status: AC
  Administered 2017-04-29: 81 mg via ORAL
  Filled 2017-04-29: qty 1

## 2017-04-29 MED ORDER — MIDAZOLAM HCL 2 MG/2ML IJ SOLN
INTRAMUSCULAR | Status: AC
  Filled 2017-04-29: qty 2

## 2017-04-29 MED ORDER — HEPARIN (PORCINE) IN NACL 2-0.9 UNIT/ML-% IJ SOLN
INTRAMUSCULAR | Status: AC
  Filled 2017-04-29: qty 1000

## 2017-04-29 MED ORDER — ONDANSETRON HCL 4 MG/2ML IJ SOLN
4.0000 mg | Freq: Four times a day (QID) | INTRAMUSCULAR | Status: DC | PRN
Start: 2017-04-29 — End: 2017-04-29

## 2017-04-29 SURGICAL SUPPLY — 14 items
CATH ANGIO 5F JB2 100CM (CATHETERS) ×2 IMPLANT
CATH INFINITI 5 FR IM (CATHETERS) ×2 IMPLANT
CATH INFINITI 5 FR MPA2 (CATHETERS) ×2 IMPLANT
CATH INFINITI 5FR ANG PIGTAIL (CATHETERS) ×2 IMPLANT
CATH INFINITI 5FR JL4 (CATHETERS) ×2 IMPLANT
CATH INFINITI JR4 5F (CATHETERS) ×2 IMPLANT
DEVICE CLOSURE MYNXGRIP 5F (Vascular Products) ×2 IMPLANT
KIT MANI 3VAL PERCEP (MISCELLANEOUS) ×3 IMPLANT
NDL PERC 18GX7CM (NEEDLE) IMPLANT
NEEDLE PERC 18GX7CM (NEEDLE) ×3 IMPLANT
PACK CARDIAC CATH (CUSTOM PROCEDURE TRAY) ×3 IMPLANT
SHEATH PINNACLE 5F 10CM (SHEATH) ×2 IMPLANT
WIRE EMERALD 3MM-J .035X260CM (WIRE) ×2 IMPLANT
WIRE GUIDERIGHT .035X150 (WIRE) ×2 IMPLANT

## 2017-04-29 NOTE — Discharge Instructions (Signed)
Femoral Site Care °Refer to this sheet in the next few weeks. These instructions provide you with information about caring for yourself after your procedure. Your health care provider may also give you more specific instructions. Your treatment has been planned according to current medical practices, but problems sometimes occur. Call your health care provider if you have any problems or questions after your procedure. °What can I expect after the procedure? °After your procedure, it is typical to have the following: °Bruising at the site that usually fades within 1-2 weeks. °Blood collecting in the tissue (hematoma) that may be painful to the touch. It should usually decrease in size and tenderness within 1-2 weeks. ° °Follow these instructions at home: °Take medicines only as directed by your health care provider. °You may shower 24-48 hours after the procedure or as directed by your health care provider. Remove the bandage (dressing) and gently wash the site with plain soap and water. Pat the area dry with a clean towel. Do not rub the site, because this may cause bleeding. °Do not take baths, swim, or use a hot tub until your health care provider approves. °Check your insertion site every day for redness, swelling, or drainage. °Do not apply powder or lotion to the site. °Limit use of stairs to twice a day for the first 2-3 days or as directed by your health care provider. °Do not squat for the first 2-3 days or as directed by your health care provider. °Do not lift over 10 lb (4.5 kg) for 5 days after your procedure or as directed by your health care provider. °Ask your health care provider when it is okay to: °Return to work or school. °Resume usual physical activities or sports. °Resume sexual activity. °Do not drive home if you are discharged the same day as the procedure. Have someone else drive you. °You may drive 24 hours after the procedure unless otherwise instructed by your health care provider. °Do  not operate machinery or power tools for 24 hours after the procedure or as directed by your health care provider. °If your procedure was done as an outpatient procedure, which means that you went home the same day as your procedure, a responsible adult should be with you for the first 24 hours after you arrive home. °Keep all follow-up visits as directed by your health care provider. This is important. °Contact a health care provider if: °You have a fever. °You have chills. °You have increased bleeding from the site. Hold pressure on the site. °Get help right away if: °You have unusual pain at the site. °You have redness, warmth, or swelling at the site. °You have drainage (other than a small amount of blood on the dressing) from the site. °The site is bleeding, and the bleeding does not stop after 30 minutes of holding steady pressure on the site. °Your leg or foot becomes pale, cool, tingly, or numb. °This information is not intended to replace advice given to you by your health care provider. Make sure you discuss any questions you have with your health care provider. °Document Released: 09/14/2013 Document Revised: 06/19/2015 Document Reviewed: 07/31/2013 °Elsevier Interactive Patient Education © 2018 [employer] °Angiogram, Care After °This sheet gives you information about how to care for yourself after your procedure. Your health care provider may also give you more specific instructions. If you have problems or questions, contact your health care provider. °What can I expect after the procedure? °After the procedure, it is common to have bruising   and tenderness at the catheter insertion area. °Follow these instructions at home: °Insertion site care °· Follow instructions from your health care provider about how to take care of your insertion site. Make sure you: °? Wash your hands with soap and water before you change your bandage (dressing). If soap and water are not available, use hand  sanitizer. °? Change your dressing as told by your health care provider. °? Leave stitches (sutures), skin glue, or adhesive strips in place. These skin closures may need to stay in place for 2 weeks or longer. If adhesive strip edges start to loosen and curl up, you may trim the loose edges. Do not remove adhesive strips completely unless your health care provider tells you to do that. °· Do not take baths, swim, or use a hot tub until your health care provider approves. °· You may shower 24-48 hours after the procedure or as told by your health care provider. °? Gently wash the site with plain soap and water. °? Pat the area dry with a clean towel. °? Do not rub the site. This may cause bleeding. °· Do not apply powder or lotion to the site. Keep the site clean and dry. °· Check your insertion site every day for signs of infection. Check for: °? Redness, swelling, or pain. °? Fluid or blood. °? Warmth. °? Pus or a bad smell. °Activity °· Rest as told by your health care provider, usually for 1-2 days. °· Do not lift anything that is heavier than 10 lbs. (4.5 kg) or as told by your health care provider. °· Do not drive for 24 hours if you were given a medicine to help you relax (sedative). °· Do not drive or use heavy machinery while taking prescription pain medicine. °General instructions °· Return to your normal activities as told by your health care provider, usually in about a week. Ask your health care provider what activities are safe for you. °· If the catheter site starts bleeding, lie flat and put pressure on the site. If the bleeding does not stop, get help right away. This is a medical emergency. °· Drink enough fluid to keep your urine clear or pale yellow. This helps flush the contrast dye from your body. °· Take over-the-counter and prescription medicines only as told by your health care provider. °· Keep all follow-up visits as told by your health care provider. This is important. °Contact a health  care provider if: °· You have a fever or chills. °· You have redness, swelling, or pain around your insertion site. °· You have fluid or blood coming from your insertion site. °· The insertion site feels warm to the touch. °· You have pus or a bad smell coming from your insertion site. °· You have bruising around the insertion site. °· You notice blood collecting in the tissue around the catheter site (hematoma). The hematoma may be painful to the touch. °Get help right away if: °· You have severe pain at the catheter insertion area. °· The catheter insertion area swells very fast. °· The catheter insertion area is bleeding, and the bleeding does not stop when you hold steady pressure on the area. °· The area near or just beyond the catheter insertion site becomes pale, cool, tingly, or numb. °These symptoms may represent a serious problem that is an emergency. Do not wait to see if the symptoms will go away. Get medical help right away. Call your local emergency services (911 in the [place] Do not   drive yourself to the hospital. °Summary °· After the procedure, it is common to have bruising and tenderness at the catheter insertion area. °· After the procedure, it is important to rest and drink plenty of fluids. °· Do not take baths, swim, or use a hot tub until your health care provider says it is okay to do so. You may shower 24-48 hours after the procedure or as told by your health care provider. °· If the catheter site starts bleeding, lie flat and put pressure on the site. If the bleeding does not stop, get help right away. This is a medical emergency. °This information is not intended to replace advice given to you by your health care provider. Make sure you discuss any questions you have with your health care provider. °Document Released: 07/30/2004 Document Revised: 12/17/2015 Document Reviewed: 12/17/2015 °Elsevier Interactive Patient Education © 2018 [employer] °Moderate Conscious Sedation, Adult, Care  After °These instructions provide you with information about caring for yourself after your procedure. Your health care provider may also give you more specific instructions. Your treatment has been planned according to current medical practices, but problems sometimes occur. Call your health care provider if you have any problems or questions after your procedure. °What can I expect after the procedure? °After your procedure, it is common: °· To feel sleepy for several hours. °· To feel clumsy and have poor balance for several hours. °· To have poor judgment for several hours. °· To vomit if you eat too soon. ° °Follow these instructions at home: °For at least 24 hours after the procedure: ° °· Do not: °? Participate in activities where you could fall or become injured. °? Drive. °? Use heavy machinery. °? Drink alcohol. °? Take sleeping pills or medicines that cause drowsiness. °? Make important decisions or sign legal documents. °? Take care of children on your own. °· Rest. °Eating and drinking °· Follow the diet recommended by your health care provider. °· If you vomit: °? Drink water, juice, or soup when you can drink without vomiting. °? Make sure you have little or no nausea before eating solid foods. °General instructions °· Have a responsible adult stay with you until you are awake and alert. °· Take over-the-counter and prescription medicines only as told by your health care provider. °· If you smoke, do not smoke without supervision. °· Keep all follow-up visits as told by your health care provider. This is important. °Contact a health care provider if: °· You keep feeling nauseous or you keep vomiting. °· You feel light-headed. °· You develop a rash. °· You have a fever. °Get help right away if: °· You have trouble breathing. °This information is not intended to replace advice given to you by your health care provider. Make sure you discuss any questions you have with your health care  provider. °Document Released: 11/01/2012 Document Revised: 06/16/2015 Document Reviewed: 05/03/2015 °Elsevier Interactive Patient Education © 2018 Else[employer] Inc. ° °

## 2017-05-02 ENCOUNTER — Encounter: Payer: Self-pay | Admitting: Cardiovascular Disease

## 2017-05-12 ENCOUNTER — Telehealth: Payer: Self-pay

## 2017-05-12 NOTE — Telephone Encounter (Signed)
Pt calls and states he notices that if he strains for a bowel movement that bright red blood in on tissue, does not see in commode or stools that he is aware of. Pt is taking Miralax daily and a stool softener daily. When asked how long this has been going on and he stated since the first of the year.  I informed pt  That I would let Dr. Bonna Gains know and get back with him as to her response and that it sounded like he may need to increase his Miralax but this is something that she will address.

## 2017-05-23 ENCOUNTER — Telehealth: Payer: Self-pay

## 2017-05-23 NOTE — Telephone Encounter (Signed)
Refer to message of 05/23/17.

## 2017-05-23 NOTE — Telephone Encounter (Signed)
Left message to contact office. DR  Are his BMs soft? If not increase Miralax to twice a day. If does not stop ask him to call us. If he sees a lot of blood, he should go to the ER    Pt calls and states he notices that if he strains for a bowel movement that bright red blood in on tissue, does not see in commode or stools that he is aware of. Pt is taking Miralax daily and a stool softener daily. When asked how long this has been going on and he stated since the first of the year.  I informed pt  That I would let Dr. Bonna Gains know and get back with him as to her response and that it sounded like he may need to increase his Miralax but this is something that she will address.

## 2017-06-02 NOTE — Telephone Encounter (Signed)
Patient stopped by. He stated he didn't listen to his message from nurse. I went over the notes. He understands and will call us if it's not working.

## 2017-07-13 ENCOUNTER — Ambulatory Visit: Payer: Non-veteran care | Admitting: Gastroenterology

## 2017-07-18 ENCOUNTER — Other Ambulatory Visit: Payer: Self-pay

## 2017-07-18 ENCOUNTER — Ambulatory Visit: Payer: Medicaid Other | Admitting: Gastroenterology

## 2017-07-18 ENCOUNTER — Encounter (INDEPENDENT_AMBULATORY_CARE_PROVIDER_SITE_OTHER): Payer: Self-pay

## 2017-07-18 ENCOUNTER — Encounter: Payer: Self-pay | Admitting: Gastroenterology

## 2017-07-18 VITALS — BP 129/68 | HR 62 | Ht 75.0 in | Wt 253.8 lb

## 2017-07-18 DIAGNOSIS — L989 Disorder of the skin and subcutaneous tissue, unspecified: Secondary | ICD-10-CM | POA: Diagnosis not present

## 2017-07-18 DIAGNOSIS — K7030 Alcoholic cirrhosis of liver without ascites: Secondary | ICD-10-CM

## 2017-07-18 NOTE — Patient Instructions (Signed)
F/U 3 months Referral to Dermatology.

## 2017-07-18 NOTE — Progress Notes (Signed)
Andrew Antigua, MD [address]  [place]  [place], [place] [address]  Main: [phone]  Fax: [phone]   Primary Care Physician: [facility]  Primary Gastroenterologist:  Dr. Vonda Macdonald  Chief Complaint  Patient presents with  . Follow-up    rectal bleeding of which continues with BM's, but not everytime. Saw clots last week.     HPI: [name] is a 57 y.o. male reports blood streaks in stool intermittently, once a week.  States he has been using a high-fiber diet, and MiraLAX or Metamucil that we had discussed with him on last visit.  States he has soft bowel movements and does not strain.  He underwent colonoscopy for this in January 2019 as detailed below.  On previous visit, abdominal ultrasound was ordered and planned.  Patient states this was done at the [facility] about a month ago, but the results have not been sent to [place].  He also states he had blood work done a month ago as well.  No hematemesis, no confusion, good appetite, no weight loss, no abdominal distention.  As per previous notes: "His last EGD was in February 15, 2017 for variceal screening and showed 1 isolated grade 2 varix with no stigmata of recent bleeding. Due to its small size, it was not amenable to banding. One small island, 0.5 cm, of salmon colored mucosa was seen at the GE junction. Gastric erythema was noted. Chronic inactive gastritis was reported on pathology report, salmon colored mucosa biopsy did not show any intestinal metaplasia.Squamous and gastric mucosa was reported on the biopsy results.  Colonoscopy was on Feb 15, 2017 as well and was normal except for a fair prep. Rectal mucosa showed some erythema, which was thought to be due to barotrauma, biopsies were normal. Repeat recommended in 5 years due to fair prep.  As per previous notes, patient was initially seen on February 04, 2017, as he was referred for hard stool bright red blood per rectum at the  time, isolated episode which resolved spontaneously."Patient has had a colonoscopy 10 years ago and he does not know the results of it. He is a [facility] patient and his previous endoscopy reports are not available to [place]."  "Incidentally, his lab work shows thrombocytopenia with platelets of 118. An ultrasound in 2014 on epic reports evidence of cirrhosis. Reports previous history of heavy alcohol use for years, has not had a drink in 12 years. Has never had a paracentesis. No episodes of confusion. Denies abdominal distention. States he thinks he was seeing GI at the [facility]."    Current Outpatient Medications  Medication Sig Dispense Refill  . albuterol (PROVENTIL HFA;VENTOLIN HFA) 108 (90 Base) MCG/ACT inhaler Inhale 2 puffs into the lungs 3 (three) times daily as needed for wheezing or shortness of breath.    Marland Kitchen amLODipine (NORVASC) 10 MG tablet Take 5 mg by mouth daily.    Marland Kitchen atorvastatin (LIPITOR) 20 MG tablet Take 10 mg by mouth at bedtime.     . Calcium Carbonate-Vitamin D (CALCIUM 600+D) 600-400 MG-UNIT tablet Take 1 tablet by mouth daily.    . carvedilol (COREG) 25 MG tablet Take 25 mg by mouth 2 (two) times daily with a meal.    . cetirizine (ZYRTEC) 10 MG tablet Take 10 mg by mouth 2 (two) times daily.     . Cholecalciferol (VITAMIN D3) 1000 units CAPS Take 1,000 Units by mouth daily.     . clobetasol ointment (TEMOVATE) 0.05 % Apply  1 application topically 2 (two) times daily.    . colchicine 0.6 MG tablet Take 0.6 mg by mouth as needed (If wrist flares, take 0.6 mg twice daily for 4 days to see if there is improvement.).     Marland Kitchen finasteride (PROSCAR) 5 MG tablet Take 5 mg by mouth daily.    . fluticasone (FLONASE) 50 MCG/ACT nasal spray Place 2 sprays into both nostrils daily.     Marland Kitchen gabapentin (NEURONTIN) 300 MG capsule Take 900 mg by mouth 3 (three) times daily.     . Glucose Blood (PRECISION XTRA BLOOD GLUCOSE VI) by In Vitro route.    . hydrocerin (EUCERIN) CREA Apply 1 application  topically 2 (two) times daily.    . insulin aspart (NOVOLOG) 100 UNIT/ML FlexPen Inject 10 Units into the skin 3 (three) times daily with meals.     . insulin glargine (LANTUS) 100 UNIT/ML injection Inject 40 Units into the skin at bedtime.     . isosorbide mononitrate (IMDUR) 60 MG 24 hr tablet Take 60 mg by mouth daily.     . magnesium oxide (MAG-OX) 400 MG tablet Take 400 mg by mouth daily.    . metFORMIN (GLUCOPHAGE) 500 MG tablet Take 500 mg by mouth daily with breakfast.     . mirtazapine (REMERON) 7.5 MG tablet Take 7.5 mg by mouth at bedtime.    . naloxone (NARCAN) nasal spray 4 mg/0.1 mL Place 1 spray into the nose.    . oxybutynin (DITROPAN) 5 MG tablet Take 5 mg by mouth 4 (four) times daily.     . polyvinyl alcohol (LIQUIFILM TEARS) 1.4 % ophthalmic solution Place 1 drop into both eyes 4 (four) times daily as needed for dry eyes.    Marland Kitchen terazosin (HYTRIN) 2 MG capsule Take 4 mg by mouth at bedtime.    Marland Kitchen venlafaxine (EFFEXOR) 25 MG tablet Take 25 mg by mouth 2 (two) times daily.     Current Facility-Administered Medications  Medication Dose Route Frequency Provider Last Rate Last Dose  . betamethasone acetate-betamethasone sodium phosphate (CELESTONE) injection 3 mg  3 mg Intramuscular Once Edrick Kins, DPM        Allergies as of 07/18/2017 - Review Complete 07/18/2017  Allergen Reaction Noted  . Bee venom Anaphylaxis and Swelling 06/13/2011  . Questran [cholestyramine]  10/01/2016    ROS:  General: Negative for anorexia, weight loss, fever, chills, fatigue, weakness. ENT: Negative for hoarseness, difficulty swallowing , nasal congestion. CV: Negative for chest pain, angina, palpitations, dyspnea on exertion, peripheral edema.  Respiratory: Negative for dyspnea at rest, dyspnea on exertion, cough, sputum, wheezing.  GI: See history of present illness. GU:  Negative for dysuria, hematuria, urinary incontinence, urinary frequency, nocturnal urination.  Endo: Negative for  unusual weight change.    Physical Examination:   BP 129/68   Pulse 62   Ht 6\' 3"  (1.905 m)   Wt 253 lb 12.8 oz (115.1 kg)   BMI 31.72 kg/m   General: Well-nourished, well-developed in no acute distress.  Eyes: No icterus. Conjunctivae pink. Mouth: Oropharyngeal mucosa moist and pink , no lesions erythema or exudate. Neck: Supple, Trachea midline Abdomen: Bowel sounds are normal, nontender, nondistended, no hepatosplenomegaly or masses, no abdominal bruits or hernia , no rebound or guarding.   Rectal: No external hemorrhoids, digital rectal exam showed yellow stool.  No melena or bright red blood.  Posterior perianal area with white discoloration/vitiligo noted Extremities: No lower extremity edema. No clubbing or deformities. Neuro: Alert  and oriented x 3.  Grossly intact. Skin: Warm and dry, no jaundice.   Psych: Alert and cooperative, normal mood and affect.   Labs: CMP     Component Value Date/Time   NA 139 02/04/2017 1241   K 3.7 02/04/2017 1241   CL 108 02/04/2017 1241   CO2 22 02/04/2017 1241   GLUCOSE 121 (H) 02/04/2017 1241   BUN 11 02/04/2017 1241   CREATININE 0.94 02/04/2017 1241   CALCIUM 9.2 02/04/2017 1241   PROT 7.2 02/04/2017 1241   ALBUMIN 4.0 02/04/2017 1241   AST 46 (H) 02/04/2017 1241   ALT 46 02/04/2017 1241   ALKPHOS 95 02/04/2017 1241   BILITOT 1.1 02/04/2017 1241   GFRNONAA >60 02/04/2017 1241   GFRAA >60 02/04/2017 1241   Lab Results  Component Value Date   WBC 8.2 02/04/2017   HGB 15.5 02/04/2017   HCT 46.7 02/04/2017   MCV 87.1 02/04/2017   PLT 163 02/04/2017    Imaging Studies: No results found.  Assessment and Plan:   PASCUAL MANTEL is a 57 y.o. 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  We will try to obtain these records There are  March 2019 labs under media and scanned charts, with normal liver enzymes.  INR is not available in those labs to calculate meld. Patient reports previous immunization against hepatitis A and B by the [facility].  States flu shot is also up-to-date. We will try to obtain abdominal ultrasound done at the [facility] as per patient a month ago.  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

## 2017-07-18 NOTE — Addendum Note (Signed)
Addended by: Earl Lagos on: 07/18/2017 03:43 PM   Modules accepted: Orders

## 2017-08-11 ENCOUNTER — Telehealth: Payer: Self-pay | Admitting: Gastroenterology

## 2017-08-11 NOTE — Telephone Encounter (Signed)
Pt states he talked with a radiologist and they told him it was probably were he strained because it did not occur again after wiping. Also said that he had a MRI a few days ago at the [facility] [place] and was told everything was fine. I encouraged pt to monitor if when blood is coming from rectum when wipes or after BM-if he strained, does it occur later also when not having a BM. He was encouraged to contact office for any further concerns and if needed we would have him come for an appt.

## 2017-08-11 NOTE — Telephone Encounter (Signed)
Pt is calling to speak to Dr. Bonna Gains I nurse he is still having  blood in his stool

## 2017-08-29 ENCOUNTER — Ambulatory Visit: Payer: Non-veteran care | Admitting: Gastroenterology

## 2017-10-19 ENCOUNTER — Ambulatory Visit (INDEPENDENT_AMBULATORY_CARE_PROVIDER_SITE_OTHER): Payer: Medicaid Other | Admitting: Gastroenterology

## 2017-10-19 ENCOUNTER — Encounter: Payer: Self-pay | Admitting: Gastroenterology

## 2017-10-19 VITALS — BP 105/62 | HR 56 | Ht 75.0 in | Wt 245.2 lb

## 2017-10-19 DIAGNOSIS — K7030 Alcoholic cirrhosis of liver without ascites: Secondary | ICD-10-CM | POA: Diagnosis not present

## 2017-10-19 NOTE — Progress Notes (Signed)
Vonda Antigua, MD [address]  [place]  [place], [place] [address]  Main: [phone]  Fax: [phone]   Primary Care Physician: [facility] [place]  Primary Gastroenterologist:  Dr. Vonda Antigua  Chief Complaint  Patient presents with  . Follow-up    rectal bleeding    HPI: [name] is a 57 y.o. male with history of cirrhosis here for follow-up.  Patient gets care at the [facility] but started seeing [place] as he would like to establish local GI care.  Between his last visit and this visit he has scheduled a procedure at the [facility] [place] that is scheduled for next Monday due to hematochezia.  He states they may be doing a flexible sigmoidoscopy or colonoscopy due to his intermittent hematochezia.    We had ordered a right upper quadrant ultrasound for evaluation of cirrhosis given his thrombocytopenia and a 2014 ultrasound reporting evidence of cirrhosis at the time.  Patient reports he had the ultrasound done at the [facility] and we had requested records and have not obtained any of these records.  Last meld on February 04, 2017 labs was 9 Well compensated cirrhosis  March 2019 labs under media and scanned chart showed normal liver enzymes.  Patient reports previous immunization for hepatitis A and B by the [facility] [place], and states flu shot is up-to-date as well.  Again we have not obtain any records from the [facility] [place] though they were requested.  Patient denies any abdominal distention, lower extremity swelling, episodes of confusion, nausea or vomiting, hematemesis, melena.  As per previous notes: "His last EGD was in February 15, 2017 for variceal screening and showed 1 isolated grade 2 varix with no stigmata of recent bleeding. Due to its small size, it was not amenable to banding. One small island, 0.5 cm, of salmon colored mucosa was seen at the GE junction. Gastric erythema was noted. Chronic inactive gastritis was reported on pathology report, salmon colored mucosa biopsy  did not show any intestinal metaplasia.Squamous and gastric mucosa was reported on the biopsy results.  Colonoscopy was on Jan22, 2019 as well and was normal except for a fair prep. Rectal mucosa showed some erythema, which was thought to be due to barotrauma, biopsies were normal. Repeat recommended in 5 years due to fair prep.  As per previous notes, patient was initially seen on February 04, 2017, as he was referred for hard stool bright red blood per rectum at the time, isolated episode which resolved spontaneously.  Incidentally, his lab work shows thrombocytopenia with platelets of 118. An ultrasound in 2014 on epic reports evidence of cirrhosis. Reports previous history of heavy alcohol use for years, has not had a drink in 12 years. Has never had a paracentesis. No episodes of confusion. Denies abdominal distention. States he thinks he was seeing GI at the [facility].    Current Outpatient Medications  Medication Sig Dispense Refill  . albuterol (PROVENTIL HFA;VENTOLIN HFA) 108 (90 Base) MCG/ACT inhaler Inhale 2 puffs into the lungs 3 (three) times daily as needed for wheezing or shortness of breath.    Marland Kitchen amLODipine (NORVASC) 10 MG tablet Take 5 mg by mouth daily.    Marland Kitchen atorvastatin (LIPITOR) 20 MG tablet Take 10 mg by mouth at bedtime.     . Calcium Carbonate-Vitamin D (CALCIUM 600+D) 600-400 MG-UNIT tablet Take 1 tablet by mouth daily.    . carvedilol (COREG) 25 MG tablet Take 25 mg by mouth 2 (two) times daily with a meal.    .  cetirizine (ZYRTEC) 10 MG tablet Take 10 mg by mouth 2 (two) times daily.     . Cholecalciferol (VITAMIN D3) 1000 units CAPS Take 1,000 Units by mouth daily.     . clobetasol ointment (TEMOVATE) 3.01 % Apply 1 application topically 2 (two) times daily.    . colchicine 0.6 MG tablet Take 0.6 mg by mouth as needed (If wrist flares, take 0.6 mg twice daily for 4 days to see if there is improvement.).     Marland Kitchen finasteride (PROSCAR) 5 MG tablet Take 5 mg by  mouth daily.    . fluticasone (FLONASE) 50 MCG/ACT nasal spray Place 2 sprays into both nostrils daily.     Marland Kitchen gabapentin (NEURONTIN) 300 MG capsule Take 900 mg by mouth 3 (three) times daily.     . Glucose Blood (PRECISION XTRA BLOOD GLUCOSE VI) by In Vitro route.    . hydrocerin (EUCERIN) CREA Apply 1 application topically 2 (two) times daily.    . insulin aspart (NOVOLOG) 100 UNIT/ML FlexPen Inject 10 Units into the skin 3 (three) times daily with meals.     . insulin glargine (LANTUS) 100 UNIT/ML injection Inject 40 Units into the skin at bedtime.     . isosorbide mononitrate (IMDUR) 60 MG 24 hr tablet Take 60 mg by mouth daily.     . magnesium oxide (MAG-OX) 400 MG tablet Take 400 mg by mouth daily.    . metFORMIN (GLUCOPHAGE) 500 MG tablet Take 500 mg by mouth daily with breakfast.     . mirtazapine (REMERON) 7.5 MG tablet Take 7.5 mg by mouth at bedtime.    . naloxone (NARCAN) nasal spray 4 mg/0.1 mL Place 1 spray into the nose.    . oxybutynin (DITROPAN) 5 MG tablet Take 5 mg by mouth 4 (four) times daily.     . polyvinyl alcohol (LIQUIFILM TEARS) 1.4 % ophthalmic solution Place 1 drop into both eyes 4 (four) times daily as needed for dry eyes.    Marland Kitchen terazosin (HYTRIN) 2 MG capsule Take 4 mg by mouth at bedtime.    Marland Kitchen venlafaxine (EFFEXOR) 25 MG tablet Take 25 mg by mouth 2 (two) times daily.     Current Facility-Administered Medications  Medication Dose Route Frequency Provider Last Rate Last Dose  . betamethasone acetate-betamethasone sodium phosphate (CELESTONE) injection 3 mg  3 mg Intramuscular Once Edrick Kins, DPM        Allergies as of 10/19/2017 - Review Complete 10/19/2017  Allergen Reaction Noted  . Bee venom Anaphylaxis and Swelling 06/13/2011  . Questran [cholestyramine]  10/01/2016    ROS:  General: Negative for anorexia, weight loss, fever, chills, fatigue, weakness. ENT: Negative for hoarseness, difficulty swallowing , nasal congestion. CV: Negative for chest  pain, angina, palpitations, dyspnea on exertion, peripheral edema.  Respiratory: Negative for dyspnea at rest, dyspnea on exertion, cough, sputum, wheezing.  GI: See history of present illness. GU:  Negative for dysuria, hematuria, urinary incontinence, urinary frequency, nocturnal urination.  Endo: Negative for unusual weight change.    Physical Examination:   BP 105/62   Pulse (!) 56   Ht 6\' 3"  (1.905 m)   Wt 245 lb 3.2 oz (111.2 kg)   BMI 30.65 kg/m   General: Well-nourished, well-developed in no acute distress.  Eyes: No icterus. Conjunctivae pink. Mouth: Oropharyngeal mucosa moist and pink , no lesions erythema or exudate. Neck: Supple, Trachea midline Abdomen: Bowel sounds are normal, nontender, nondistended, no hepatosplenomegaly or masses, no abdominal bruits or hernia ,  no rebound or guarding.   Extremities: No lower extremity edema. No clubbing or deformities. Neuro: Alert and oriented x 3.  Grossly intact. Skin: Warm and dry, no jaundice.   Psych: Alert and cooperative, normal mood and affect.   Labs: CMP     Component Value Date/Time   NA 139 02/04/2017 1241   K 3.7 02/04/2017 1241   CL 108 02/04/2017 1241   CO2 22 02/04/2017 1241   GLUCOSE 121 (H) 02/04/2017 1241   BUN 11 02/04/2017 1241   CREATININE 0.94 02/04/2017 1241   CALCIUM 9.2 02/04/2017 1241   PROT 7.2 02/04/2017 1241   ALBUMIN 4.0 02/04/2017 1241   AST 46 (H) 02/04/2017 1241   ALT 46 02/04/2017 1241   ALKPHOS 95 02/04/2017 1241   BILITOT 1.1 02/04/2017 1241   GFRNONAA >60 02/04/2017 1241   GFRAA >60 02/04/2017 1241   Lab Results  Component Value Date   WBC 8.2 02/04/2017   HGB 15.5 02/04/2017   HCT 46.7 02/04/2017   MCV 87.1 02/04/2017   PLT 163 02/04/2017    Imaging Studies: No results found.  Assessment and Plan:   CARVELL HOEFFNER is a 57 y.o. 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  He verbalized understanding and states he will get this done. Meld 9 on February 04, 2017 labs No evidence of decompensation We will repeat labs on next visit in a month after he hopefully gets his records from the [facility] [place] that we have been awaiting  -Intermittent hematochezia Patient is undergoing a flexible sigmoidoscopy or colonoscopy at the [place] next week I have asked him to get a record of those after the procedure and bring it to [place] on his next clinic visit in a month so we can review this as well I have encouraged him to call us with symptoms in the future so he can get local care with [place] since he is following up with [place], but if he prefers to have his procedures done at the [facility] [place] that is okay as well. High-fiber diet MiraLAX daily with goal of 1-2 soft bowel movements daily.  If not at goal, patient instructed to increase dose to twice daily.  If loose stools with the medication, patient asked to decrease the medication to every other day, or half dose daily.  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

## 2017-11-28 ENCOUNTER — Ambulatory Visit: Payer: Non-veteran care | Admitting: Gastroenterology

## 2018-03-06 ENCOUNTER — Other Ambulatory Visit: Payer: Self-pay

## 2018-03-06 ENCOUNTER — Encounter (HOSPITAL_COMMUNITY): Payer: Self-pay | Admitting: Emergency Medicine

## 2018-03-06 ENCOUNTER — Emergency Department (HOSPITAL_COMMUNITY)
Admission: EM | Admit: 2018-03-06 | Discharge: 2018-03-06 | Disposition: A | Discharge status: Home / Self Care | Payer: Medicaid Other | Attending: Emergency Medicine | Admitting: Emergency Medicine

## 2018-03-06 ENCOUNTER — Emergency Department (HOSPITAL_COMMUNITY): Payer: Medicaid Other

## 2018-03-06 DIAGNOSIS — Z951 Presence of aortocoronary bypass graft: Secondary | ICD-10-CM | POA: Insufficient documentation

## 2018-03-06 DIAGNOSIS — Z794 Long term (current) use of insulin: Secondary | ICD-10-CM | POA: Insufficient documentation

## 2018-03-06 DIAGNOSIS — I25110 Atherosclerotic heart disease of native coronary artery with unstable angina pectoris: Secondary | ICD-10-CM | POA: Insufficient documentation

## 2018-03-06 DIAGNOSIS — Z8546 Personal history of malignant neoplasm of prostate: Secondary | ICD-10-CM | POA: Insufficient documentation

## 2018-03-06 DIAGNOSIS — R319 Hematuria, unspecified: Secondary | ICD-10-CM | POA: Insufficient documentation

## 2018-03-06 DIAGNOSIS — E119 Type 2 diabetes mellitus without complications: Secondary | ICD-10-CM | POA: Insufficient documentation

## 2018-03-06 DIAGNOSIS — Z87891 Personal history of nicotine dependence: Secondary | ICD-10-CM | POA: Insufficient documentation

## 2018-03-06 DIAGNOSIS — Z8673 Personal history of transient ischemic attack (TIA), and cerebral infarction without residual deficits: Secondary | ICD-10-CM | POA: Insufficient documentation

## 2018-03-06 DIAGNOSIS — Z79899 Other long term (current) drug therapy: Secondary | ICD-10-CM | POA: Diagnosis not present

## 2018-03-06 DIAGNOSIS — Z7982 Long term (current) use of aspirin: Secondary | ICD-10-CM | POA: Insufficient documentation

## 2018-03-06 LAB — URINALYSIS, ROUTINE W REFLEX MICROSCOPIC
Bilirubin Urine: NEGATIVE
GLUCOSE, UA: NEGATIVE mg/dL
Ketones, ur: NEGATIVE mg/dL
LEUKOCYTES UA: NEGATIVE
NITRITE: NEGATIVE
PH: 7 (ref 5.0–8.0)
Protein, ur: NEGATIVE mg/dL
RBC / HPF: >50 RBC/hpf — ABNORMAL HIGH (ref 0–5)
SPECIFIC GRAVITY, URINE: 1.006 (ref 1.005–1.030)

## 2018-03-06 MED ORDER — DOXYCYCLINE HYCLATE 100 MG PO CAPS: 100.0000 mg | ORAL_CAPSULE | Freq: Two times a day (BID) | ORAL | 0 refills | Status: DC

## 2018-03-06 NOTE — ED Notes (Signed)
Pt states he has not urinated since 1600-1700 today. Pt complains of lower abdominal pain and not being able to urinate.   Pt bladder scanned. 15cc scanned in bladder

## 2018-03-06 NOTE — Discharge Instructions (Addendum)
See the Urologist for recheck.  Return if any problems.

## 2018-03-06 NOTE — ED Triage Notes (Signed)
Pt states he has had blood in his urine with dysuria since 3 pm.

## 2018-03-06 NOTE — ED Provider Notes (Signed)
[facility] EMERGENCY DEPARTMENT Provider Note   CSN: [id number] Arrival date & time: 03/06/18  1745     History   Chief Complaint Chief Complaint  Patient presents with  . Hematuria    HPI [name] is a 58 y.o. male.  The history is provided by the patient. No language interpreter was used.  Hematuria  This is a new problem. The current episode started yesterday. The problem occurs constantly. The problem has not changed since onset.Nothing aggravates the symptoms. Nothing relieves the symptoms.  Pt reports he noticed blood in his urine today.   Past Medical History:  Diagnosis Date  . Asthma   . Cancer [facility])    prostate  . Chronic back pain   . Chronic neck pain   . Cirrhosis of liver ([place])   . Complicated migraine 5/64/3329  . Coronary artery disease   . Depression   . Diabetes mellitus without complication ([place])   . Gallstones   . Headache(784.0)   . Hepatitis C   . Hypercholesteremia   . Hypertension   . Left leg paresthesias   . Myocardial infarction ([place])   . Stroke [facility])    TIA  . Vertigo     Patient Active Problem List   Diagnosis Date Noted  . Unstable angina ([place]) 04/28/2017  . Encounter for screening for upper gastrointestinal disorder   . Columnar epithelial-lined lower esophagus   . Secondary esophageal varices without bleeding ([place])   . Encounter for screening colonoscopy   . Rectum inflammation   . Hematochezia   . Syncope, near 01/24/2017  . Hypotension 10/11/2012  . CAD (coronary artery disease) 10/11/2012  . S/P CABG (coronary artery bypass graft) 10/11/2012  . Cervical spondylosis 09/22/2011  . Neck pain 09/22/2011  . Syncope 06/20/2011  . ARF (acute renal failure) ([place]) 06/20/2011  . Complicated migraine 51/88/4166  . Hypertension   . Hepatitis C   . Cirrhosis of liver ([place])   . Depression   . Chronic back pain   . Left leg paresthesias   . Hypercholesteremia   . Chronic neck pain   . AYTKZSWF(093.2)     Past  Surgical History:  Procedure Laterality Date  . BYPASS GRAFT     triple bypass surgery  . CARDIAC SURGERY    . COLONOSCOPY WITH PROPOFOL N/A 02/15/2017   Procedure: COLONOSCOPY WITH PROPOFOL;  Surgeon: Virgel Manifold, MD;  Location: [place];  Service: Endoscopy;  Laterality: N/A;  . CORONARY ARTERY BYPASS GRAFT    . ESOPHAGOGASTRODUODENOSCOPY (EGD) WITH PROPOFOL N/A 02/15/2017   Procedure: ESOPHAGOGASTRODUODENOSCOPY (EGD) WITH PROPOFOL;  Surgeon: Virgel Manifold, MD;  Location: [place];  Service: Endoscopy;  Laterality: N/A;  . HAND SURGERY  1984  . LEFT HEART CATH AND CORONARY ANGIOGRAPHY Left 04/29/2017   Procedure: LEFT HEART CATH AND CORONARY ANGIOGRAPHY;  Surgeon: Dionisio David, MD;  Location: [place] CV LAB;  Service: Cardiovascular;  Laterality: Left;  . ORTHOPEDIC SURGERY     "on my legs"        Home Medications    Prior to Admission medications   Medication Sig Start Date End Date Taking? Authorizing Provider  albuterol (PROVENTIL HFA;VENTOLIN HFA) 108 (90 Base) MCG/ACT inhaler Inhale 2 puffs into the lungs 3 (three) times daily as needed for wheezing or shortness of breath.   Yes [provider]  amLODipine (NORVASC) 10 MG tablet Take 5 mg by mouth daily.   Yes [provider]  aspirin EC  81 MG tablet Take 81 mg by mouth daily.   Yes [provider]  atorvastatin (LIPITOR) 20 MG tablet Take 10 mg by mouth at bedtime.    Yes [provider]  bisacodyl (DULCOLAX) 5 MG EC tablet Take 10 mg by mouth at bedtime.   Yes [provider]  Calcium Carbonate-Vitamin D (CALCIUM 600+D) 600-400 MG-UNIT tablet Take 1 tablet by mouth 2 (two) times daily.    Yes [provider]  carvedilol (COREG) 25 MG tablet Take 25 mg by mouth 2 (two) times daily with a meal.   Yes [provider]  cetirizine (ZYRTEC) 10 MG tablet Take 10 mg by mouth 2 (two) times daily.    Yes [provider]    Cholecalciferol (VITAMIN D3) 1000 units CAPS Take 1,000 Units by mouth daily.    Yes [provider]  finasteride (PROSCAR) 5 MG tablet Take 5 mg by mouth daily.   Yes [provider]  fluticasone (FLONASE) 50 MCG/ACT nasal spray Place 2 sprays into both nostrils daily.    Yes [provider]  furosemide (LASIX) 40 MG tablet Take 40 mg by mouth daily.   Yes [provider]  gabapentin (NEURONTIN) 300 MG capsule Take 900 mg by mouth 3 (three) times daily.    Yes [provider]  hydrocerin (EUCERIN) CREA Apply 1 application topically 2 (two) times daily.   Yes [provider]  insulin aspart (NOVOLOG) 100 UNIT/ML FlexPen Inject 10 Units into the skin 3 (three) times daily with meals.    Yes [provider]  insulin glargine (LANTUS) 100 UNIT/ML injection Inject 44 Units into the skin at bedtime.    Yes [provider]  latanoprost (XALATAN) 0.005 % ophthalmic solution Place 1 drop into both eyes at bedtime.   Yes [provider]  magnesium oxide (MAG-OX) 400 MG tablet Take 400 mg by mouth daily.   Yes [provider]  Melatonin 5 MG TABS Take 5 mg by mouth at bedtime.   Yes [provider]  mesalamine (CANASA) 1000 MG suppository Place 1,000 mg rectally at bedtime.   Yes [provider]  metFORMIN (GLUCOPHAGE) 500 MG tablet Take 500 mg by mouth daily with breakfast.    Yes [provider]  mirtazapine (REMERON) 7.5 MG tablet Take 7.5 mg by mouth at bedtime.   Yes [provider]  naloxone (NARCAN) nasal spray 4 mg/0.1 mL Place 1 spray into the nose.   Yes [provider]  oxybutynin (DITROPAN) 5 MG tablet Take 5 mg by mouth 2 (two) times daily.    Yes [provider]  potassium chloride SA (K-DUR,KLOR-CON) 20 MEQ tablet Take 20 mEq by mouth daily.   Yes [provider]  sennosides-docusate sodium (SENOKOT-S) 8.6-50 MG tablet Take 2 tablets by mouth  daily.   Yes [provider]  sucralfate (CARAFATE) 1 GM/10ML suspension Place 1 g rectally 2 (two) times daily.   Yes [provider]  terazosin (HYTRIN) 2 MG capsule Take 4 mg by mouth at bedtime.   Yes [provider]  traMADol (ULTRAM) 50 MG tablet Take 100 mg by mouth 3 (three) times daily.   Yes [provider]  venlafaxine (EFFEXOR) 75 MG tablet Take 225 mg by mouth daily.    Yes [provider]  doxycycline (VIBRAMYCIN) 100 MG capsule Take 1 capsule (100 mg total) by mouth 2 (two) times daily. 03/06/18   Fransico Meadow, PA-C    Family History  Family History  Problem Relation Age of Onset  . Diabetes Brother   . Diabetes Sister   . Diabetes Father   . Hypertension Father   . Diabetes Mother   . Hypertension Mother   . Hypertension Sister   . Hypertension Brother     Social History Social History   Tobacco Use  . Smoking status: Former Smoker    Packs/day: 1.00    Years: 35.00    Pack years: 35.00    Last attempt to quit: 01/25/2009    Years since quitting: 9.1  . Smokeless tobacco: Never Used  Substance Use Topics  . Alcohol use: No    Comment: former alcoholic  . Drug use: No    Comment: former cocaine abuse     Allergies   Bee venom and Questran [cholestyramine]   Review of Systems Review of Systems  Genitourinary: Positive for hematuria.  All other systems reviewed and are negative.    Physical Exam Updated Vital Signs BP 122/80 (BP Location: Left Arm)   Pulse 77   Temp 97.9 F (36.6 C) (Oral)   Resp 18   Ht 6\' 3"  (1.905 m)   Wt 117.9 kg   SpO2 99%   BMI 32.50 kg/m   Physical Exam Vitals signs and nursing note reviewed.  Constitutional:      Appearance: He is well-developed.  HENT:     Head: Normocephalic.     Mouth/Throat:     Mouth: Mucous membranes are moist.  Eyes:     Pupils: Pupils are equal, round, and reactive to light.  Neck:     Musculoskeletal: Normal range of motion.    Cardiovascular:     Rate and Rhythm: Normal rate.     Pulses: Normal pulses.  Pulmonary:     Effort: Pulmonary effort is normal.  Abdominal:     General: Abdomen is flat. There is no distension.  Musculoskeletal: Normal range of motion.  Skin:    General: Skin is warm.  Neurological:     General: No focal deficit present.     Mental Status: He is alert and oriented to person, place, and time.  Psychiatric:        Mood and Affect: Mood normal.      ED Treatments / Results  Labs (all labs ordered are listed, but only abnormal results are displayed) Labs Reviewed  URINALYSIS, ROUTINE W REFLEX MICROSCOPIC - Abnormal; Notable for the following components:      Result Value   Hgb urine dipstick LARGE (*)    RBC / HPF >50 (*)    Bacteria, UA RARE (*)    All other components within normal limits  URINE CULTURE    EKG None  Radiology Ct Renal Stone Study  Result Date: 03/06/2018 CLINICAL DATA:  Hematuria EXAM: CT ABDOMEN AND PELVIS WITHOUT CONTRAST TECHNIQUE: Multidetector CT imaging of the abdomen and pelvis was performed following the standard protocol without IV contrast. COMPARISON:  CT 08/23/2003 FINDINGS: Lower chest: Lung bases demonstrate no acute consolidation or effusion. The heart size is upper limits of normal. Lower sternotomy. Small air-filled distal hiatal hernia. Hepatobiliary: Contour nodularity of the liver suspect for cirrhosis. Multiple calcified gallstones. No biliary dilatation Pancreas: Unremarkable. No pancreatic ductal dilatation or surrounding inflammatory changes. Spleen: Normal in size without focal abnormality. Adrenals/Urinary Tract: Adrenal glands are normal. No hydronephrosis. Punctate nonobstructing stone lower pole left kidney. Bladder normal Stomach/Bowel: Stomach is within normal limits. Appendix appears normal. No evidence of bowel wall thickening, distention,  or inflammatory changes. Vascular/Lymphatic: Moderate aortic atherosclerosis. Aneurysmal  dilatation of the common iliac arteries, measuring 2.1 cm on the left and 2.1 cm on the right. No significant adenopathy Reproductive: Post treatment changes of the prostate Other: Negative for free air or free fluid. Musculoskeletal: No acute or suspicious osseous abnormality. Degenerative changes of the lower lumbar spine. IMPRESSION: 1. Negative for hydronephrosis or ureteral stone. Punctate nonobstructing stone lower pole left kidney. 2. Gallstones 3. Nodular liver contour suspect for cirrhosis. Electronically Signed   By: Donavan Foil M.D.   On: 03/06/2018 20:58    Procedures Procedures (including critical care time)  Medications Ordered in ED Medications - No data to display   Initial Impression / Assessment and Plan / ED Course  I have reviewed the triage vital signs and the nursing notes.  Pertinent labs & imaging results that were available during my care of the patient were reviewed by me and considered in my medical decision making (see chart for details).     MDM  Pt counseled on blood in urine.  Pt sees a urologist at the [place].  Pt advised to schedule appointment to see his MD for recheck.    Final Clinical Impressions(s) / ED Diagnoses   Final diagnoses:  Hematuria, unspecified type    ED Discharge Orders         Ordered    doxycycline (VIBRAMYCIN) 100 MG capsule  2 times daily     03/06/18 2137           Fransico Meadow, [place] 03/06/18 2315    Maudie Flakes, MD 03/06/18 2320

## 2018-03-06 NOTE — ED Notes (Signed)
Pt ambulating to bathroom.

## 2018-03-08 LAB — URINE CULTURE: CULTURE: NO GROWTH

## 2018-11-06 ENCOUNTER — Emergency Department (HOSPITAL_COMMUNITY)
Admission: EM | Admit: 2018-11-06 | Discharge: 2018-11-07 | Disposition: A | Discharge status: Home / Self Care | Payer: No Typology Code available for payment source | Attending: Emergency Medicine | Admitting: Emergency Medicine

## 2018-11-06 DIAGNOSIS — Z8673 Personal history of transient ischemic attack (TIA), and cerebral infarction without residual deficits: Secondary | ICD-10-CM | POA: Diagnosis not present

## 2018-11-06 DIAGNOSIS — Z7982 Long term (current) use of aspirin: Secondary | ICD-10-CM | POA: Diagnosis not present

## 2018-11-06 DIAGNOSIS — N289 Disorder of kidney and ureter, unspecified: Secondary | ICD-10-CM

## 2018-11-06 DIAGNOSIS — Z87891 Personal history of nicotine dependence: Secondary | ICD-10-CM | POA: Diagnosis not present

## 2018-11-06 DIAGNOSIS — E119 Type 2 diabetes mellitus without complications: Secondary | ICD-10-CM | POA: Diagnosis not present

## 2018-11-06 DIAGNOSIS — Z951 Presence of aortocoronary bypass graft: Secondary | ICD-10-CM | POA: Insufficient documentation

## 2018-11-06 DIAGNOSIS — R55 Syncope and collapse: Secondary | ICD-10-CM | POA: Insufficient documentation

## 2018-11-06 DIAGNOSIS — Z8546 Personal history of malignant neoplasm of prostate: Secondary | ICD-10-CM | POA: Insufficient documentation

## 2018-11-06 DIAGNOSIS — I10 Essential (primary) hypertension: Secondary | ICD-10-CM | POA: Insufficient documentation

## 2018-11-06 DIAGNOSIS — R0789 Other chest pain: Secondary | ICD-10-CM | POA: Insufficient documentation

## 2018-11-07 ENCOUNTER — Other Ambulatory Visit: Payer: Self-pay

## 2018-11-07 ENCOUNTER — Emergency Department (HOSPITAL_COMMUNITY): Payer: No Typology Code available for payment source

## 2018-11-07 ENCOUNTER — Encounter (HOSPITAL_COMMUNITY): Payer: Self-pay

## 2018-11-07 LAB — CBC WITH DIFFERENTIAL/PLATELET
Abs Immature Granulocytes: 0.03 10*3/uL (ref 0.00–0.07)
Basophils Absolute: 0 10*3/uL (ref 0.0–0.1)
Basophils Relative: 0 %
Eosinophils Absolute: 0.5 10*3/uL (ref 0.0–0.5)
Eosinophils Relative: 6 %
HCT: 40.6 % (ref 39.0–52.0)
Hemoglobin: 13 g/dL (ref 13.0–17.0)
Immature Granulocytes: 0 %
Lymphocytes Relative: 32 %
Lymphs Abs: 2.8 10*3/uL (ref 0.7–4.0)
MCH: 28.1 pg (ref 26.0–34.0)
MCHC: 32 g/dL (ref 30.0–36.0)
MCV: 87.7 fL (ref 80.0–100.0)
Monocytes Absolute: 0.7 10*3/uL (ref 0.1–1.0)
Monocytes Relative: 8 %
Neutro Abs: 4.7 10*3/uL (ref 1.7–7.7)
Neutrophils Relative %: 54 %
Platelets: 169 10*3/uL (ref 150–400)
RBC: 4.63 MIL/uL (ref 4.22–5.81)
RDW: 14.6 % (ref 11.5–15.5)
WBC: 8.7 10*3/uL (ref 4.0–10.5)
nRBC: 0 % (ref 0.0–0.2)

## 2018-11-07 LAB — COMPREHENSIVE METABOLIC PANEL
ALT: 19 U/L (ref 0–44)
AST: 24 U/L (ref 15–41)
Albumin: 3.7 g/dL (ref 3.5–5.0)
Alkaline Phosphatase: 70 U/L (ref 38–126)
Anion gap: 12 (ref 5–15)
BUN: 10 mg/dL (ref 6–20)
CO2: 23 mmol/L (ref 22–32)
Calcium: 8.9 mg/dL (ref 8.9–10.3)
Chloride: 105 mmol/L (ref 98–111)
Creatinine, Ser: 1.63 mg/dL — ABNORMAL HIGH (ref 0.61–1.24)
GFR calc Af Amer: 52 mL/min — ABNORMAL LOW (ref >60)
GFR calc non Af Amer: 45 mL/min — ABNORMAL LOW (ref >60)
Glucose, Bld: 113 mg/dL — ABNORMAL HIGH (ref 70–99)
Potassium: 3.8 mmol/L (ref 3.5–5.1)
Sodium: 140 mmol/L (ref 135–145)
Total Bilirubin: 0.8 mg/dL (ref 0.3–1.2)
Total Protein: 6.4 g/dL — ABNORMAL LOW (ref 6.5–8.1)

## 2018-11-07 LAB — TROPONIN I (HIGH SENSITIVITY)
Troponin I (High Sensitivity): 12 ng/L (ref <18)
Troponin I (High Sensitivity): 9 ng/L (ref <18)

## 2018-11-07 LAB — LACTIC ACID, PLASMA
Lactic Acid, Venous: 3.2 mmol/L (ref 0.5–1.9)
Lactic Acid, Venous: 2.7 mmol/L (ref 0.5–1.9)

## 2018-11-07 MED ORDER — SODIUM CHLORIDE 0.9 % IV BOLUS
1000.0000 mL | Freq: Once | INTRAVENOUS | Status: AC
  Administered 2018-11-07: 01:00:00 1000 mL via INTRAVENOUS

## 2018-11-07 MED ORDER — ACETAMINOPHEN 325 MG PO TABS
650.0000 mg | ORAL_TABLET | Freq: Once | ORAL | Status: AC
  Administered 2018-11-07: 650 mg via ORAL
  Filled 2018-11-07: qty 2

## 2018-11-07 NOTE — ED Triage Notes (Addendum)
Pt arrived via [facility] c/o chest discomfort, dyspnea, and malaise. Pt experienced a ground level fall in dining room, brief syncopal episode. EMS advised patient was hypotensive (systolic 0000000), administered 450 mL NS en-route. Pt is A&Ox4.

## 2018-11-07 NOTE — Discharge Instructions (Addendum)
Make sure to drink enough fluids.  Return if you are having any problems.

## 2018-11-07 NOTE — ED Provider Notes (Signed)
[place] EMERGENCY DEPARTMENT Provider Note   CSN: [id number] Arrival date & time: 11/06/18  2356    History   Chief Complaint Chief Complaint  Patient presents with  . Fall  . Shortness of Breath  . Fatigue  . Chest Pain    HPI [name] is a 58 y.o. male.   The history is provided by the patient.  He has history hypertension, diabetes, hyperlipidemia, coronary artery disease, prostate cancer, stroke and comes in following a syncopal episode.  He states that he was standing and talking when he got dizzy and passed out.  There was some tightness in his chest and some dyspnea and nausea but no diaphoresis.  He denies palpitations.  EMS noted low blood pressure and gave him 500 mL of IV fluid.  Past Medical History:  Diagnosis Date  . Asthma   . Cancer [facility])    prostate  . Chronic back pain   . Chronic neck pain   . Cirrhosis of liver ([place])   . Complicated migraine [id number]  . Coronary artery disease   . Depression   . Diabetes mellitus without complication ([place])   . Gallstones   . Headache(784.0)   . Hepatitis C   . Hypercholesteremia   . Hypertension   . Left leg paresthesias   . Myocardial infarction ([place])   . Stroke [facility])    TIA  . Vertigo     Patient Active Problem List   Diagnosis Date Noted  . Unstable angina ([place]) 04/28/2017  . Encounter for screening for upper gastrointestinal disorder   . Columnar epithelial-lined lower esophagus   . Secondary esophageal varices without bleeding ([place])   . Encounter for screening colonoscopy   . Rectum inflammation   . Hematochezia   . Syncope, near 01/24/2017  . Hypotension 10/11/2012  . CAD (coronary artery disease) 10/11/2012  . S/P CABG (coronary artery bypass graft) 10/11/2012  . Cervical spondylosis 09/22/2011  . Neck pain 09/22/2011  . Syncope 06/20/2011  . ARF (acute renal failure) ([place]) 06/20/2011  . Complicated migraine [id number]  . Hypertension   . Hepatitis C   .  Cirrhosis of liver ([place])   . Depression   . Chronic back pain   . Left leg paresthesias   . Hypercholesteremia   . Chronic neck pain   . [id number])     Past Surgical History:  Procedure Laterality Date  . BYPASS GRAFT     triple bypass surgery  . CARDIAC SURGERY    . COLONOSCOPY WITH PROPOFOL N/A 02/15/2017   Procedure: COLONOSCOPY WITH PROPOFOL;  Surgeon: Virgel Manifold, MD;  Location: [place];  Service: Endoscopy;  Laterality: N/A;  . CORONARY ARTERY BYPASS GRAFT    . ESOPHAGOGASTRODUODENOSCOPY (EGD) WITH PROPOFOL N/A 02/15/2017   Procedure: ESOPHAGOGASTRODUODENOSCOPY (EGD) WITH PROPOFOL;  Surgeon: Virgel Manifold, MD;  Location: [place];  Service: Endoscopy;  Laterality: N/A;  . HAND SURGERY  1984  . LEFT HEART CATH AND CORONARY ANGIOGRAPHY Left 04/29/2017   Procedure: LEFT HEART CATH AND CORONARY ANGIOGRAPHY;  Surgeon: Dionisio , MD;  Location: [place] CV LAB;  Service: Cardiovascular;  Laterality: Left;  . ORTHOPEDIC SURGERY     "on my legs"        Home Medications    Prior to Admission medications   Medication Sig Start Date End Date Taking? Authorizing Provider  albuterol (PROVENTIL HFA;VENTOLIN HFA) 108 (90 Base) MCG/ACT inhaler Inhale 2 puffs into the lungs 3 (three)  times daily as needed for wheezing or shortness of breath.    [provider]  amLODipine (NORVASC) 10 MG tablet Take 5 mg by mouth daily.    [provider]  aspirin EC 81 MG tablet Take 81 mg by mouth daily.    [provider]  atorvastatin (LIPITOR) 20 MG tablet Take 10 mg by mouth at bedtime.     [provider]  bisacodyl (DULCOLAX) 5 MG EC tablet Take 10 mg by mouth at bedtime.    [provider]  Calcium Carbonate-Vitamin D (CALCIUM 600+D) 600-400 MG-UNIT tablet Take 1 tablet by mouth 2 (two) times daily.     [provider]  carvedilol (COREG) 25 MG tablet Take 25 mg by mouth 2 (two) times daily  with a meal.    [provider]  cetirizine (ZYRTEC) 10 MG tablet Take 10 mg by mouth 2 (two) times daily.     [provider]  Cholecalciferol (VITAMIN D3) 1000 units CAPS Take 1,000 Units by mouth daily.     [provider]  doxycycline (VIBRAMYCIN) 100 MG capsule Take 1 capsule (100 mg total) by mouth 2 (two) times daily. 03/06/18   Fransico Meadow, PA-C  finasteride (PROSCAR) 5 MG tablet Take 5 mg by mouth daily.    [provider]  fluticasone (FLONASE) 50 MCG/ACT nasal spray Place 2 sprays into both nostrils daily.     [provider]  furosemide (LASIX) 40 MG tablet Take 40 mg by mouth daily.    [provider]  gabapentin (NEURONTIN) 300 MG capsule Take 900 mg by mouth 3 (three) times daily.     [provider]  hydrocerin (EUCERIN) CREA Apply 1 application topically 2 (two) times daily.    [provider]  insulin aspart (NOVOLOG) 100 UNIT/ML FlexPen Inject 10 Units into the skin 3 (three) times daily with meals.     [provider]  insulin glargine (LANTUS) 100 UNIT/ML injection Inject 44 Units into the skin at bedtime.     [provider]  latanoprost (XALATAN) 0.005 % ophthalmic solution Place 1 drop into both eyes at bedtime.    [provider]  magnesium oxide (MAG-OX) 400 MG tablet Take 400 mg by mouth daily.    [provider]  Melatonin 5 MG TABS Take 5 mg by mouth at bedtime.    [provider]  mesalamine (CANASA) 1000 MG suppository Place 1,000 mg rectally at bedtime.    [provider]  metFORMIN (GLUCOPHAGE) 500 MG tablet Take 500 mg by mouth daily with breakfast.     [provider]  mirtazapine (REMERON) 7.5 MG tablet Take 7.5 mg by mouth at bedtime.    [provider]  naloxone [facility]) nasal spray 4 mg/0.1 mL Place 1 spray into the nose.    [provider]  oxybutynin (DITROPAN) 5 MG tablet Take 5 mg by mouth 2 (two) times  daily.     [provider]  potassium chloride SA (K-DUR,KLOR-CON) 20 MEQ tablet Take 20 mEq by mouth daily.    [provider]  sennosides-docusate sodium (SENOKOT-S) 8.6-50 MG tablet Take 2 tablets by mouth daily.    [provider]  sucralfate (CARAFATE) 1 GM/10ML suspension Place 1 g rectally 2 (two) times daily.    [provider]  terazosin (HYTRIN) 2 MG capsule Take 4 mg by mouth at bedtime.    [provider]  traMADol (ULTRAM) 50 MG tablet Take 100 mg  by mouth 3 (three) times daily.    [provider]  venlafaxine (EFFEXOR) 75 MG tablet Take 225 mg by mouth daily.     [provider]    Family History Family History  Problem Relation Age of Onset  . Diabetes Brother   . Diabetes Sister   . Diabetes Father   . Hypertension Father   . Diabetes Mother   . Hypertension Mother   . Hypertension Sister   . Hypertension Brother     Social History Social History   Tobacco Use  . Smoking status: Former Smoker    Packs/day: 1.00    Years: 35.00    Pack years: 35.00    Quit date: 01/25/2009    Years since quitting: 9.7  . Smokeless tobacco: Never Used  Substance Use Topics  . Alcohol use: No    Comment: former alcoholic  . Drug use: No    Comment: former cocaine abuse     Allergies   Bee venom and Questran [cholestyramine]   Review of Systems Review of Systems  All other systems reviewed and are negative.    Physical Exam Updated Vital Signs BP 95/65   Pulse 79   Temp 98 F (36.7 C)   Resp 14   Ht 6\' 2"  (1.88 m)   Wt 113.4 kg   SpO2 99%   BMI 32.10 kg/m   Physical Exam Vitals signs and nursing note reviewed.    58 year old male, resting comfortably and in no acute distress. Vital signs are significant for low blood pressure. Oxygen saturation is 99%, which is normal. Head is normocephalic and atraumatic. PERRLA, EOMI. Oropharynx is clear. Neck is nontender and supple without adenopathy or  JVD. Back is nontender and there is no CVA tenderness. Lungs are clear without rales, wheezes, or rhonchi. Chest is nontender. Heart has regular rate and rhythm without murmur. Abdomen is soft, flat, nontender without masses or hepatosplenomegaly and peristalsis is normoactive. Extremities have no cyanosis or edema, full range of motion is present. Skin is warm and dry without rash. Neurologic: Mental status is normal, cranial nerves are intact, there are no motor or sensory deficits.  ED Treatments / Results  Labs (all labs ordered are listed, but only abnormal results are displayed) Labs Reviewed  COMPREHENSIVE METABOLIC PANEL - Abnormal; Notable for the following components:      Result Value   Glucose, Bld 113 (*)    Creatinine, Ser 1.63 (*)    Total Protein 6.4 (*)    GFR calc non Af Amer 45 (*)    GFR calc Af Amer 52 (*)    All other components within normal limits  LACTIC ACID, PLASMA - Abnormal; Notable for the following components:   Lactic Acid, Venous 3.2 (*)    All other components within normal limits  CBC WITH DIFFERENTIAL/PLATELET  LACTIC ACID, PLASMA  URINALYSIS, ROUTINE W REFLEX MICROSCOPIC  TROPONIN I (HIGH SENSITIVITY)  TROPONIN I (HIGH SENSITIVITY)    EKG EKG Interpretation  Date/Time:  Tuesday November 07 2018 00:04:49 EDT Ventricular Rate:  80 PR Interval:    QRS Duration: 172 QT Interval:  478 QTC Calculation: 552 R Axis:   97 Text Interpretation:  Sinus rhythm RBBB and LPFB When compared with ECG of 01/24/2017, No significant change was found Confirmed by Delora Fuel ([id number]) on 11/07/2018 12:11:53 AM   Radiology Dg Chest Port 1 View  Result Date: 11/07/2018 CLINICAL DATA:  Syncope, ground level fall EXAM: PORTABLE CHEST 1 VIEW  COMPARISON:  Radiograph February 07, 2014, CT April 28, 2005 FINDINGS: Patient is post CABG. There is remote fracture of the uppermost sternotomy wire and new fracture the third superior most sternotomy wire when compared  to prior radiography. Remaining sternotomy wires are intact and aligned. Lung volumes are low with streaky areas of basilar atelectasis. No pneumothorax or effusion. Cardiomediastinal contours are otherwise unremarkable. No acute osseous or soft tissue abnormality. IMPRESSION: 1. Remote fracture of the uppermost sternotomy wire and new fracture of the third superior most sternotomy wire when compared to prior radiography. 2. Low lung volumes with streaky areas of basilar atelectasis. 3. No other acute osseous or soft tissue abnormality. Electronically Signed   By: Lovena Le M.D.   On: 11/07/2018 01:20    Procedures Procedures   Medications Ordered in ED Medications  sodium chloride 0.9 % bolus 1,000 mL (0 mLs Intravenous Stopped 11/07/18 0256)  acetaminophen (TYLENOL) tablet 650 mg (650 mg Oral Given 11/07/18 0347)     Initial Impression / Assessment and Plan / ED Course  I have reviewed the triage vital signs and the nursing notes.  Pertinent labs & imaging results that were available during my care of the patient were reviewed by me and considered in my medical decision making (see chart for details).  Syncopal episode with low blood pressure.  Chest discomfort concerning for possible ACS or angina equivalent.  ECG shows no acute changes.  In spite of small fluid bolus given by EMS, blood pressure still low and he will be given additional fluids.  Old records are reviewed and he had a cardiac catheterization 18 months ago showing three-vessel coronary artery disease and patent grafts to the circumflex and LAD.  Labs show mildly elevated creatinine.  This is higher than last recorded value in January 2019, but he did have significant elevation of creatinine prior to that.  Troponin is normal x2.  Lactic acid level was mildly elevated consistent with his history of hypotension, no evidence of sepsis.  After IV fluids, blood pressure has come up and he orthostatic vital signs showed no  significant change.  He was felt to be safe for discharge.  Syncope is felt to be related to low blood pressure, probably from relative dehydration.  He is to follow-up with his primary care provider, return precautions discussed.  Final Clinical Impressions(s) / ED Diagnoses   Final diagnoses:  Syncope, unspecified syncope type  Renal insufficiency    ED Discharge Orders    None       Delora Fuel, MD [id number] [phone]

## 2018-11-07 NOTE — ED Notes (Addendum)
Patient reminded urine sample needed, unable to provide at this time. Patient offered water.

## 2019-01-05 ENCOUNTER — Ambulatory Visit: Payer: Self-pay | Admitting: Cardiovascular Disease

## 2019-01-05 MED ORDER — SODIUM CHLORIDE 0.9% FLUSH
3.0000 mL | Freq: Two times a day (BID) | INTRAVENOUS | Status: DC
  Filled 2019-01-05: qty 3

## 2019-01-08 ENCOUNTER — Encounter: Payer: Self-pay | Admitting: Cardiovascular Disease

## 2019-01-08 ENCOUNTER — Encounter
Admission: RE | Disposition: A | Discharge status: Home / Self Care | Payer: Self-pay | Source: Home / Self Care | Attending: Cardiovascular Disease

## 2019-01-08 ENCOUNTER — Ambulatory Visit
Admission: RE | Admit: 2019-01-08 | Discharge: 2019-01-08 | Disposition: A | Discharge status: Home / Self Care | Payer: Medicaid Other | Attending: Cardiovascular Disease | Admitting: Cardiovascular Disease

## 2019-01-08 DIAGNOSIS — E785 Hyperlipidemia, unspecified: Secondary | ICD-10-CM | POA: Insufficient documentation

## 2019-01-08 DIAGNOSIS — I25110 Atherosclerotic heart disease of native coronary artery with unstable angina pectoris: Secondary | ICD-10-CM | POA: Diagnosis present

## 2019-01-08 DIAGNOSIS — Z7982 Long term (current) use of aspirin: Secondary | ICD-10-CM | POA: Diagnosis not present

## 2019-01-08 DIAGNOSIS — I200 Unstable angina: Secondary | ICD-10-CM | POA: Insufficient documentation

## 2019-01-08 DIAGNOSIS — Z794 Long term (current) use of insulin: Secondary | ICD-10-CM | POA: Diagnosis not present

## 2019-01-08 DIAGNOSIS — Z87891 Personal history of nicotine dependence: Secondary | ICD-10-CM | POA: Insufficient documentation

## 2019-01-08 DIAGNOSIS — Z951 Presence of aortocoronary bypass graft: Secondary | ICD-10-CM | POA: Diagnosis not present

## 2019-01-08 DIAGNOSIS — I10 Essential (primary) hypertension: Secondary | ICD-10-CM | POA: Diagnosis not present

## 2019-01-08 DIAGNOSIS — Z79899 Other long term (current) drug therapy: Secondary | ICD-10-CM | POA: Insufficient documentation

## 2019-01-08 DIAGNOSIS — E119 Type 2 diabetes mellitus without complications: Secondary | ICD-10-CM | POA: Insufficient documentation

## 2019-01-08 HISTORY — PX: LEFT HEART CATH AND CORONARY ANGIOGRAPHY: CATH118249

## 2019-01-08 LAB — GLUCOSE, CAPILLARY
Glucose-Capillary: 50 mg/dL — ABNORMAL LOW (ref 70–99)
Glucose-Capillary: 54 mg/dL — ABNORMAL LOW (ref 70–99)
Glucose-Capillary: 92 mg/dL (ref 70–99)

## 2019-01-08 SURGERY — LEFT HEART CATH AND CORONARY ANGIOGRAPHY
Anesthesia: Moderate Sedation | Laterality: Left

## 2019-01-08 MED ORDER — SODIUM CHLORIDE 0.9 % IV SOLN: 250.0000 mL | INTRAVENOUS | Status: DC | PRN

## 2019-01-08 MED ORDER — FENTANYL CITRATE (PF) 100 MCG/2ML IJ SOLN
INTRAMUSCULAR | Status: DC | PRN
  Administered 2019-01-08: 50 ug via INTRAVENOUS

## 2019-01-08 MED ORDER — SODIUM CHLORIDE 0.9 % WEIGHT BASED INFUSION: 1.0000 mL/kg/h | INTRAVENOUS | Status: DC

## 2019-01-08 MED ORDER — SODIUM CHLORIDE 0.9 % WEIGHT BASED INFUSION
3.0000 mL/kg/h | INTRAVENOUS | Status: DC
  Administered 2019-01-08: 3 mL/kg/h via INTRAVENOUS

## 2019-01-08 MED ORDER — SODIUM CHLORIDE 0.9% FLUSH: 3.0000 mL | INTRAVENOUS | Status: DC | PRN

## 2019-01-08 MED ORDER — SODIUM CHLORIDE 0.9% FLUSH: 3.0000 mL | Freq: Two times a day (BID) | INTRAVENOUS | Status: DC

## 2019-01-08 MED ORDER — ACETAMINOPHEN 325 MG PO TABS: 650.0000 mg | ORAL_TABLET | ORAL | Status: DC | PRN

## 2019-01-08 MED ORDER — IOHEXOL 300 MG/ML  SOLN
INTRAMUSCULAR | Status: DC | PRN
  Administered 2019-01-08: 16:00:00 150 mL

## 2019-01-08 MED ORDER — LABETALOL HCL 5 MG/ML IV SOLN: 10.0000 mg | INTRAVENOUS | Status: DC | PRN

## 2019-01-08 MED ORDER — ASPIRIN 81 MG PO CHEW: 81.0000 mg | CHEWABLE_TABLET | ORAL | Status: DC

## 2019-01-08 MED ORDER — LABETALOL HCL 5 MG/ML IV SOLN
INTRAVENOUS | Status: AC
  Filled 2019-01-08: qty 4

## 2019-01-08 MED ORDER — HYDRALAZINE HCL 20 MG/ML IJ SOLN: 10.0000 mg | INTRAMUSCULAR | Status: DC | PRN

## 2019-01-08 MED ORDER — MIDAZOLAM HCL 2 MG/2ML IJ SOLN
INTRAMUSCULAR | Status: AC
  Filled 2019-01-08: qty 2

## 2019-01-08 MED ORDER — HEPARIN (PORCINE) IN NACL 2000-0.9 UNIT/L-% IV SOLN
INTRAVENOUS | Status: DC | PRN
  Administered 2019-01-08: 500 mL

## 2019-01-08 MED ORDER — FENTANYL CITRATE (PF) 100 MCG/2ML IJ SOLN
INTRAMUSCULAR | Status: AC
  Filled 2019-01-08: qty 2

## 2019-01-08 MED ORDER — MIDAZOLAM HCL 2 MG/2ML IJ SOLN
INTRAMUSCULAR | Status: DC | PRN
  Administered 2019-01-08: 1 mg via INTRAVENOUS

## 2019-01-08 MED ORDER — ONDANSETRON HCL 4 MG/2ML IJ SOLN: 4.0000 mg | Freq: Four times a day (QID) | INTRAMUSCULAR | Status: DC | PRN

## 2019-01-08 MED ORDER — HEPARIN (PORCINE) IN NACL 1000-0.9 UT/500ML-% IV SOLN
INTRAVENOUS | Status: AC
  Filled 2019-01-08: qty 1000

## 2019-01-08 MED ORDER — LABETALOL HCL 5 MG/ML IV SOLN
INTRAVENOUS | Status: DC | PRN
  Administered 2019-01-08: 20 mg via INTRAVENOUS

## 2019-01-08 SURGICAL SUPPLY — 16 items
CATH ANGIO 5F JB2 100CM (CATHETERS) ×1 IMPLANT
CATH INFINITI 5 FR IM (CATHETERS) ×1 IMPLANT
CATH INFINITI 5 FR MPA2 (CATHETERS) ×1 IMPLANT
CATH INFINITI 5 FR RCB (CATHETERS) ×1 IMPLANT
CATH INFINITI 5FR ANG PIGTAIL (CATHETERS) ×1 IMPLANT
CATH INFINITI 5FR JL4 (CATHETERS) ×1 IMPLANT
CATH INFINITI 5FR JL5 (CATHETERS) ×1 IMPLANT
CATH INFINITI JR4 5F (CATHETERS) ×1 IMPLANT
DEVICE CLOSURE MYNXGRIP 5F (Vascular Products) ×1 IMPLANT
KIT MANI 3VAL PERCEP (MISCELLANEOUS) ×2 IMPLANT
NDL PERC 18GX7CM (NEEDLE) IMPLANT
NEEDLE PERC 18GX7CM (NEEDLE) ×2 IMPLANT
PACK CARDIAC CATH (CUSTOM PROCEDURE TRAY) ×2 IMPLANT
SHEATH AVANTI 5FR X 11CM (SHEATH) ×1 IMPLANT
WIRE EMERALD 3MM-J .035X260CM (WIRE) ×1 IMPLANT
WIRE GUIDERIGHT .035X150 (WIRE) ×1 IMPLANT

## 2019-01-08 NOTE — Discharge Instructions (Signed)
° °Moderate Conscious Sedation, Adult, Care After °These instructions provide you with information about caring for yourself after your procedure. Your health care provider may also give you more specific instructions. Your treatment has been planned according to current medical practices, but problems sometimes occur. Call your health care provider if you have any problems or questions after your procedure. °What can I expect after the procedure? °After your procedure, it is common: °· To feel sleepy for several hours. °· To feel clumsy and have poor balance for several hours. °· To have poor judgment for several hours. °· To vomit if you eat too soon. °Follow these instructions at home: °For at least 24 hours after the procedure: ° °· Do not: °? Participate in activities where you could fall or become injured. °? Drive. °? Use heavy machinery. °? Drink alcohol. °? Take sleeping pills or medicines that cause drowsiness. °? Make important decisions or sign legal documents. °? Take care of children on your own. °· Rest. °Eating and drinking °· Follow the diet recommended by your health care provider. °· If you vomit: °? Drink water, juice, or soup when you can drink without vomiting. °? Make sure you have little or no nausea before eating solid foods. °General instructions °· Have a responsible adult stay with you until you are awake and alert. °· Take over-the-counter and prescription medicines only as told by your health care provider. °· If you smoke, do not smoke without supervision. °· Keep all follow-up visits as told by your health care provider. This is important. °Contact a health care provider if: °· You keep feeling nauseous or you keep vomiting. °· You feel light-headed. °· You develop a rash. °· You have a fever. °Get help right away if: °· You have trouble breathing. °This information is not intended to replace advice given to you by your health care provider. Make sure you discuss any questions you  have with your health care provider. °Document Released: 11/01/2012 Document Revised: 12/24/2016 Document Reviewed: 05/03/2015 °Elsevier Patient Education © 2020 [employer] ° °Angiogram, Care After °This sheet gives you information about how to care for yourself after your procedure. Your doctor may also give you more specific instructions. If you have problems or questions, contact your doctor. °Follow these instructions at home: °Insertion site care °· Follow instructions from your doctor about how to take care of your long, thin tube (catheter) insertion area. Make sure you: °? Wash your hands with soap and water before you change your bandage (dressing). If you cannot use soap and water, use hand sanitizer. °? Change your bandage as told by your doctor. °? Leave stitches (sutures), skin glue, or skin tape (adhesive) strips in place. They may need to stay in place for 2 weeks or longer. If tape strips get loose and curl up, you may trim the loose edges. Do not remove tape strips completely unless your doctor says it is okay. °· Do not take baths, swim, or use a hot tub until your doctor says it is okay. °· You may shower 24-48 hours after the procedure or as told by your doctor. °? Gently wash the area with plain soap and water. °? Pat the area dry with a clean towel. °? Do not rub the area. This may cause bleeding. °· Do not apply powder or lotion to the area. Keep the area clean and dry. °· Check your insertion area every day for signs of infection. Check for: °? More redness, swelling, or pain. °?   Fluid or blood. °? Warmth. °? Pus or a bad smell. °Activity °· Rest as told by your doctor, usually for 1-2 days. °· Do not lift anything that is heavier than 10 lbs. (4.5 kg) or as told by your doctor. °· Do not drive for 24 hours if you were given a medicine to help you relax (sedative). °· Do not drive or use heavy machinery while taking prescription pain medicine. °General instructions ° °· Go back to your  normal activities as told by your doctor, usually in about a week. Ask your doctor what activities are safe for you. °· If the insertion area starts to bleed, lie flat and put pressure on the area. If the bleeding does not stop, get help right away. This is an emergency. °· Drink enough fluid to keep your pee (urine) clear or pale yellow. °· Take over-the-counter and prescription medicines only as told by your doctor. °· Keep all follow-up visits as told by your doctor. This is important. °Contact a doctor if: °· You have a fever. °· You have chills. °· You have more redness, swelling, or pain around your insertion area. °· You have fluid or blood coming from your insertion area. °· The insertion area feels warm to the touch. °· You have pus or a bad smell coming from your insertion area. °· You have more bruising around the insertion area. °· Blood collects in the tissue around the insertion area (hematoma) that may be painful to the touch. °Get help right away if: °· You have a lot of pain in the insertion area. °· The insertion area swells very fast. °· The insertion area is bleeding, and the bleeding does not stop after holding steady pressure on the area. °· The area near or just beyond the insertion area becomes pale, cool, tingly, or numb. °These symptoms may be an emergency. Do not wait to see if the symptoms will go away. Get medical help right away. Call your local emergency services (911 in the [place] Do not drive yourself to the hospital. °Summary °· After the procedure, it is common to have bruising and tenderness at the long, thin tube insertion area. °· After the procedure, it is important to rest and drink plenty of fluids. °· Do not take baths, swim, or use a hot tub until your doctor says it is okay to do so. You may shower 24-48 hours after the procedure or as told by your doctor. °· If the insertion area starts to bleed, lie flat and put pressure on the area. If the bleeding does not stop, get  help right away. This is an emergency. °This information is not intended to replace advice given to you by your health care provider. Make sure you discuss any questions you have with your health care provider. °Document Released: 04/09/2008 Document Revised: 12/24/2016 Document Reviewed: 01/06/2016 °Elsevier Patient Education © 2020 Elsevier Inc. ° °

## 2019-01-09 ENCOUNTER — Encounter: Payer: Self-pay | Admitting: Cardiology

## 2019-04-02 ENCOUNTER — Encounter (HOSPITAL_COMMUNITY): Payer: Self-pay | Admitting: Emergency Medicine

## 2019-04-02 ENCOUNTER — Inpatient Hospital Stay (HOSPITAL_COMMUNITY)
Admission: EM | Admit: 2019-04-02 | Discharge: 2019-04-05 | DRG: 871 | Disposition: A | Discharge status: Home / Self Care | Payer: No Typology Code available for payment source | Attending: Internal Medicine | Admitting: Internal Medicine

## 2019-04-02 ENCOUNTER — Emergency Department (HOSPITAL_COMMUNITY): Payer: No Typology Code available for payment source

## 2019-04-02 ENCOUNTER — Other Ambulatory Visit: Payer: Self-pay

## 2019-04-02 DIAGNOSIS — K219 Gastro-esophageal reflux disease without esophagitis: Secondary | ICD-10-CM | POA: Diagnosis present

## 2019-04-02 DIAGNOSIS — I951 Orthostatic hypotension: Secondary | ICD-10-CM | POA: Diagnosis present

## 2019-04-02 DIAGNOSIS — Z91030 Bee allergy status: Secondary | ICD-10-CM

## 2019-04-02 DIAGNOSIS — A4189 Other specified sepsis: Principal | ICD-10-CM | POA: Diagnosis present

## 2019-04-02 DIAGNOSIS — J9601 Acute respiratory failure with hypoxia: Secondary | ICD-10-CM | POA: Diagnosis present

## 2019-04-02 DIAGNOSIS — R55 Syncope and collapse: Secondary | ICD-10-CM

## 2019-04-02 DIAGNOSIS — G8929 Other chronic pain: Secondary | ICD-10-CM | POA: Diagnosis present

## 2019-04-02 DIAGNOSIS — F329 Major depressive disorder, single episode, unspecified: Secondary | ICD-10-CM | POA: Diagnosis present

## 2019-04-02 DIAGNOSIS — Z23 Encounter for immunization: Secondary | ICD-10-CM

## 2019-04-02 DIAGNOSIS — Z794 Long term (current) use of insulin: Secondary | ICD-10-CM

## 2019-04-02 DIAGNOSIS — I252 Old myocardial infarction: Secondary | ICD-10-CM

## 2019-04-02 DIAGNOSIS — Z8249 Family history of ischemic heart disease and other diseases of the circulatory system: Secondary | ICD-10-CM

## 2019-04-02 DIAGNOSIS — J45909 Unspecified asthma, uncomplicated: Secondary | ICD-10-CM | POA: Diagnosis present

## 2019-04-02 DIAGNOSIS — M542 Cervicalgia: Secondary | ICD-10-CM | POA: Diagnosis present

## 2019-04-02 DIAGNOSIS — B1920 Unspecified viral hepatitis C without hepatic coma: Secondary | ICD-10-CM | POA: Diagnosis present

## 2019-04-02 DIAGNOSIS — Z951 Presence of aortocoronary bypass graft: Secondary | ICD-10-CM

## 2019-04-02 DIAGNOSIS — A419 Sepsis, unspecified organism: Secondary | ICD-10-CM | POA: Diagnosis present

## 2019-04-02 DIAGNOSIS — U071 COVID-19: Secondary | ICD-10-CM | POA: Diagnosis not present

## 2019-04-02 DIAGNOSIS — Z8673 Personal history of transient ischemic attack (TIA), and cerebral infarction without residual deficits: Secondary | ICD-10-CM

## 2019-04-02 DIAGNOSIS — I2510 Atherosclerotic heart disease of native coronary artery without angina pectoris: Secondary | ICD-10-CM | POA: Diagnosis present

## 2019-04-02 DIAGNOSIS — J1282 Pneumonia due to coronavirus disease 2019: Secondary | ICD-10-CM | POA: Diagnosis present

## 2019-04-02 DIAGNOSIS — Z7982 Long term (current) use of aspirin: Secondary | ICD-10-CM

## 2019-04-02 DIAGNOSIS — E119 Type 2 diabetes mellitus without complications: Secondary | ICD-10-CM | POA: Diagnosis present

## 2019-04-02 DIAGNOSIS — I110 Hypertensive heart disease with heart failure: Secondary | ICD-10-CM | POA: Diagnosis present

## 2019-04-02 DIAGNOSIS — G43109 Migraine with aura, not intractable, without status migrainosus: Secondary | ICD-10-CM | POA: Diagnosis present

## 2019-04-02 DIAGNOSIS — Z888 Allergy status to other drugs, medicaments and biological substances status: Secondary | ICD-10-CM

## 2019-04-02 DIAGNOSIS — Z833 Family history of diabetes mellitus: Secondary | ICD-10-CM

## 2019-04-02 DIAGNOSIS — R7982 Elevated C-reactive protein (CRP): Secondary | ICD-10-CM | POA: Diagnosis present

## 2019-04-02 DIAGNOSIS — M549 Dorsalgia, unspecified: Secondary | ICD-10-CM | POA: Diagnosis present

## 2019-04-02 DIAGNOSIS — Z79891 Long term (current) use of opiate analgesic: Secondary | ICD-10-CM

## 2019-04-02 DIAGNOSIS — E785 Hyperlipidemia, unspecified: Secondary | ICD-10-CM | POA: Diagnosis present

## 2019-04-02 DIAGNOSIS — K7460 Unspecified cirrhosis of liver: Secondary | ICD-10-CM | POA: Diagnosis present

## 2019-04-02 DIAGNOSIS — Z87891 Personal history of nicotine dependence: Secondary | ICD-10-CM

## 2019-04-02 DIAGNOSIS — K921 Melena: Secondary | ICD-10-CM | POA: Diagnosis present

## 2019-04-02 DIAGNOSIS — Z79899 Other long term (current) drug therapy: Secondary | ICD-10-CM

## 2019-04-02 LAB — COMPREHENSIVE METABOLIC PANEL
ALT: 27 U/L (ref 0–44)
AST: 42 U/L — ABNORMAL HIGH (ref 15–41)
Albumin: 3.6 g/dL (ref 3.5–5.0)
Alkaline Phosphatase: 34 U/L — ABNORMAL LOW (ref 38–126)
Anion gap: 12 (ref 5–15)
BUN: 14 mg/dL (ref 6–20)
CO2: 22 mmol/L (ref 22–32)
Calcium: 8.3 mg/dL — ABNORMAL LOW (ref 8.9–10.3)
Chloride: 101 mmol/L (ref 98–111)
Creatinine, Ser: 1.65 mg/dL — ABNORMAL HIGH (ref 0.61–1.24)
GFR calc Af Amer: 52 mL/min — ABNORMAL LOW (ref >60)
GFR calc non Af Amer: 44 mL/min — ABNORMAL LOW (ref >60)
Glucose, Bld: 91 mg/dL (ref 70–99)
Potassium: 4 mmol/L (ref 3.5–5.1)
Sodium: 135 mmol/L (ref 135–145)
Total Bilirubin: 0.9 mg/dL (ref 0.3–1.2)
Total Protein: 6.8 g/dL (ref 6.5–8.1)

## 2019-04-02 LAB — FIBRINOGEN: Fibrinogen: 565 mg/dL — ABNORMAL HIGH (ref 210–475)

## 2019-04-02 LAB — POC SARS CORONAVIRUS 2 AG -  ED: SARS Coronavirus 2 Ag: POSITIVE — AB

## 2019-04-02 LAB — LACTATE DEHYDROGENASE: LDH: 197 U/L — ABNORMAL HIGH (ref 98–192)

## 2019-04-02 LAB — BRAIN NATRIURETIC PEPTIDE: B Natriuretic Peptide: 93 pg/mL (ref 0.0–100.0)

## 2019-04-02 LAB — POC OCCULT BLOOD, ED: Fecal Occult Bld: POSITIVE — AB

## 2019-04-02 LAB — TRIGLYCERIDES: Triglycerides: 123 mg/dL (ref <150)

## 2019-04-02 LAB — D-DIMER, QUANTITATIVE: D-Dimer, Quant: 0.34 ug/mL-FEU (ref 0.00–0.50)

## 2019-04-02 LAB — LACTIC ACID, PLASMA: Lactic Acid, Venous: 2.2 mmol/L (ref 0.5–1.9)

## 2019-04-02 MED ORDER — ACETAMINOPHEN 500 MG PO TABS
1000.0000 mg | ORAL_TABLET | Freq: Once | ORAL | Status: AC
  Administered 2019-04-02: 1000 mg via ORAL
  Filled 2019-04-02: qty 2

## 2019-04-02 MED ORDER — SODIUM CHLORIDE 0.9 % IV BOLUS
500.0000 mL | Freq: Once | INTRAVENOUS | Status: AC
  Administered 2019-04-02: 500 mL via INTRAVENOUS

## 2019-04-02 MED ORDER — SODIUM CHLORIDE 0.9% FLUSH: 3.0000 mL | Freq: Once | INTRAVENOUS | Status: DC

## 2019-04-02 MED ORDER — SODIUM CHLORIDE 0.9 % IV SOLN: INTRAVENOUS | Status: DC

## 2019-04-02 NOTE — ED Provider Notes (Addendum)
[facility] EMERGENCY DEPARTMENT Provider Note   CSN: [id number] Arrival date & time: 04/02/19  2153     History Chief Complaint  Patient presents with  . Loss of Consciousness    [name] is a 59 y.o. male.  Patient brought in by EMS.  States he started feeling ill yesterday.  Living with a family member who is Covid positive.  Patient states he had decreased appetite.  Passed out twice today.  While sitting.  States his urine is dark.  Says he had body aches.  Fever.  And cough.  No significant headache.  No chest pain.  Patient normally followed at the [facility].  Past medical history significant for hypertension hepatitis C cirrhosis of the liver.  Chronic back pain.  Hypercholesteremia.  Coronary artery disease TIA and diabetes and asthma.  Patient states he has had the bypass graft on his legs.  Also [place] triple bypass surgery in the past.  At heart catheterization in December 2020.  Cardiac catheterization in December was done at [facility].  Showed evidence of stenosis.  Report was significant for that LIMA to LAD and SVG to OM patent but SVG to RCA occluded but RCA has collaterals from LIMA and bridging collateral and ejection fraction is normal.  They were recommending treating medically at that time.  This cath was done at that time for unstable angina.  Oxygen saturations at rest on room air 92%.         Past Medical History:  Diagnosis Date  . Asthma   . Cancer [facility])    prostate  . Chronic back pain   . Chronic neck pain   . Cirrhosis of liver ([place])   . Complicated migraine [id number]  . Coronary artery disease   . Depression   . Diabetes mellitus without complication ([place])   . Gallstones   . Headache(784.0)   . Hepatitis C   . Hypercholesteremia   . Hypertension   . Left leg paresthesias   . Myocardial infarction ([place])   . Stroke [facility])    TIA  . Vertigo     Patient Active Problem List   Diagnosis Date Noted  . Unstable angina ([place]) 04/28/2017   . Encounter for screening for upper gastrointestinal disorder   . Columnar epithelial-lined lower esophagus   . Secondary esophageal varices without bleeding ([place])   . Encounter for screening colonoscopy   . Rectum inflammation   . Hematochezia   . Syncope, near 01/24/2017  . Hypotension 10/11/2012  . CAD (coronary artery disease) 10/11/2012  . S/P CABG (coronary artery bypass graft) 10/11/2012  . Cervical spondylosis 09/22/2011  . Neck pain 09/22/2011  . Syncope 06/20/2011  . ARF (acute renal failure) ([place]) 06/20/2011  . Complicated migraine [id number]  . Hypertension   . Hepatitis C   . Cirrhosis of liver ([place])   . Depression   . Chronic back pain   . Left leg paresthesias   . Hypercholesteremia   . Chronic neck pain   . [id number])     Past Surgical History:  Procedure Laterality Date  . BYPASS GRAFT     triple bypass surgery  . CARDIAC SURGERY    . COLONOSCOPY WITH PROPOFOL N/A 02/15/2017   Procedure: COLONOSCOPY WITH PROPOFOL;  Surgeon: Virgel Manifold, MD;  Location: [place];  Service: Endoscopy;  Laterality: N/A;  . CORONARY ARTERY BYPASS GRAFT    . ESOPHAGOGASTRODUODENOSCOPY (EGD) WITH PROPOFOL N/A 02/15/2017   Procedure: ESOPHAGOGASTRODUODENOSCOPY (EGD) WITH PROPOFOL;  Surgeon: Virgel Manifold, MD;  Location: [place];  Service: Endoscopy;  Laterality: N/A;  . HAND SURGERY  1984  . LEFT HEART CATH AND CORONARY ANGIOGRAPHY Left 04/29/2017   Procedure: LEFT HEART CATH AND CORONARY ANGIOGRAPHY;  Surgeon: Dionisio David, MD;  Location: [place] CV [facility];  Service: Cardiovascular;  Laterality: Left;  . LEFT HEART CATH AND CORONARY ANGIOGRAPHY Left 01/08/2019   Procedure: LEFT HEART CATH AND CORONARY ANGIOGRAPHY;  Surgeon: Dionisio David, MD;  Location: [place] CV [facility];  Service: Cardiovascular;  Laterality: Left;  . ORTHOPEDIC SURGERY     "on my legs"       Family History  Problem Relation Age of Onset  . Diabetes  Brother   . Diabetes Sister   . Diabetes Father   . Hypertension Father   . Diabetes Mother   . Hypertension Mother   . Hypertension Sister   . Hypertension Brother     Social History   Tobacco Use  . Smoking status: Former Smoker    Packs/day: 1.00    Years: 35.00    Pack years: 35.00    Quit date: 01/25/2009    Years since quitting: 10.1  . Smokeless tobacco: Never Used  Substance Use Topics  . Alcohol use: No    Comment: former alcoholic  . Drug use: No    Comment: former cocaine abuse    Home Medications Prior to Admission medications   Medication Sig Start Date End Date Taking? Authorizing Provider  albuterol (PROVENTIL HFA;VENTOLIN HFA) 108 (90 Base) MCG/ACT inhaler Inhale 2 puffs into the lungs 3 (three) times daily as needed for wheezing or shortness of breath.    [provider]  amLODipine (NORVASC) 10 MG tablet Take 5 mg by mouth daily.    [provider]  aspirin EC 81 MG tablet Take 81 mg by mouth daily.    [provider]  atorvastatin (LIPITOR) 20 MG tablet Take 10 mg by mouth at bedtime.     [provider]  bisacodyl (DULCOLAX) 5 MG EC tablet Take 10 mg by mouth at bedtime.    [provider]  Calcium Carbonate-Vitamin D (CALCIUM 600+D) 600-400 MG-UNIT tablet Take 1 tablet by mouth 2 (two) times daily.     [provider]  carvedilol (COREG) 25 MG tablet Take 25 mg by mouth 2 (two) times daily with a meal.    [provider]  cetirizine (ZYRTEC) 10 MG tablet Take 10 mg by mouth 2 (two) times daily.     [provider]  Cholecalciferol (VITAMIN D3) 1000 units CAPS Take 1,000 Units by mouth daily.     [provider]  doxycycline (VIBRAMYCIN) 100 MG capsule Take 1 capsule (100 mg total) by mouth 2 (two) times daily. 03/06/18   Fransico Meadow, PA-C  finasteride (PROSCAR) 5 MG tablet Take 5 mg by mouth daily.    [provider]  fluticasone (FLONASE) 50 MCG/ACT nasal spray  Place 2 sprays into both nostrils daily.     [provider]  furosemide (LASIX) 40 MG tablet Take 40 mg by mouth daily.    [provider]  gabapentin (NEURONTIN) 300 MG capsule Take 900 mg by mouth 3 (three) times daily.     [provider]  hydrocerin (EUCERIN) CREA Apply 1 application topically 2 (two) times daily.    [provider]  insulin aspart (NOVOLOG) 100 UNIT/ML FlexPen Inject 10 Units into the skin 3 (three) times daily  with meals.     [provider]  insulin glargine (LANTUS) 100 UNIT/ML injection Inject 44 Units into the skin at bedtime.     [provider]  latanoprost (XALATAN) 0.005 % ophthalmic solution Place 1 drop into both eyes at bedtime.    [provider]  magnesium oxide (MAG-OX) 400 MG tablet Take 400 mg by mouth daily.    [provider]  Melatonin 5 MG TABS Take 5 mg by mouth at bedtime.    [provider]  mesalamine (CANASA) 1000 MG suppository Place 1,000 mg rectally at bedtime.    [provider]  metFORMIN (GLUCOPHAGE) 500 MG tablet Take 500 mg by mouth daily with breakfast.     [provider]  mirtazapine (REMERON) 7.5 MG tablet Take 7.5 mg by mouth at bedtime.    [provider]  naloxone [facility]) nasal spray 4 mg/0.1 mL Place 1 spray into the nose.    [provider]  oxybutynin (DITROPAN) 5 MG tablet Take 5 mg by mouth 2 (two) times daily.     [provider]  potassium chloride SA (K-DUR,KLOR-CON) 20 MEQ tablet Take 20 mEq by mouth daily.    [provider]  sennosides-docusate sodium (SENOKOT-S) 8.6-50 MG tablet Take 2 tablets by mouth daily.    [provider]  sucralfate (CARAFATE) 1 GM/10ML suspension Place 1 g rectally 2 (two) times daily.    [provider]  terazosin (HYTRIN) 2 MG capsule Take 4 mg by mouth at bedtime.    [provider]  traMADol (ULTRAM) 50 MG tablet Take 100 mg by mouth 3  (three) times daily.    [provider]  venlafaxine (EFFEXOR) 75 MG tablet Take 225 mg by mouth daily.     [provider]    Allergies    Bee venom and Questran [cholestyramine]  Review of Systems   Review of Systems  Constitutional: Negative for chills and fever.  HENT: Negative for congestion, rhinorrhea and sore throat.   Eyes: Negative for visual disturbance.  Respiratory: Positive for cough and shortness of breath.   Cardiovascular: Negative for chest pain and leg swelling.  Gastrointestinal: Positive for blood in stool. Negative for abdominal pain, diarrhea, nausea and vomiting.  Genitourinary: Negative for dysuria.  Musculoskeletal: Positive for myalgias. Negative for back pain and neck pain.  Skin: Negative for rash.  Neurological: Positive for syncope. Negative for dizziness, light-headedness and headaches.  Hematological: Does not bruise/bleed easily.  Psychiatric/Behavioral: Negative for confusion.    Physical Exam Updated Vital Signs BP 106/67   Temp (!) 100.4 F (38 C) (Oral)   Resp (!) 25   Wt 110.7 kg   BMI 30.50 kg/m   Physical Exam Vitals and nursing note reviewed.  Constitutional:      Appearance: Normal appearance. He is well-developed.  HENT:     Head: Normocephalic and atraumatic.  Eyes:     Extraocular Movements: Extraocular movements intact.     Conjunctiva/sclera: Conjunctivae normal.     Pupils: Pupils are equal, round, and reactive to light.  Cardiovascular:     Rate and Rhythm: Normal rate and regular rhythm.     Heart sounds: No murmur.  Pulmonary:     Effort: Pulmonary effort is normal. No respiratory distress.     Breath sounds: Normal breath sounds.  Abdominal:     Palpations: Abdomen is soft.     Tenderness: There is no abdominal tenderness.  Genitourinary:    Rectum: Guaiac result  positive.     Comments: Stool brown with a little bit of pinkness to it.  Was heme positive slightly.  Not melena no gross  blood. Musculoskeletal:        General: Normal range of motion.     Cervical back: Normal range of motion and neck supple.  Skin:    General: Skin is warm and dry.  Neurological:     General: No focal deficit present.     Mental Status: He is alert and oriented to person, place, and time.     Cranial Nerves: No cranial nerve deficit.     ED Results / Procedures / Treatments   Labs (all labs ordered are listed, but only abnormal results are displayed) Labs Reviewed  POC SARS CORONAVIRUS 2 AG -  ED - Abnormal; Notable for the following components:      Result Value   SARS Coronavirus 2 Ag POSITIVE (*)    All other components within normal limits  CULTURE, BLOOD (ROUTINE X 2)  CULTURE, BLOOD (ROUTINE X 2)  CBC  URINALYSIS, ROUTINE W REFLEX MICROSCOPIC  COMPREHENSIVE METABOLIC PANEL  BRAIN NATRIURETIC PEPTIDE  LACTIC ACID, PLASMA  LACTIC ACID, PLASMA  D-DIMER, QUANTITATIVE (NOT AT [facility])  PROCALCITONIN  LACTATE DEHYDROGENASE  FERRITIN  TRIGLYCERIDES  FIBRINOGEN  C-REACTIVE PROTEIN  CBG MONITORING, ED    EKG EKG Interpretation  Date/Time:  Monday April 02 2019 22:06:28 EST Ventricular Rate:  83 PR Interval:    QRS Duration: 171 QT Interval:  449 QTC Calculation: 528 R Axis:   110 Text Interpretation: Sinus rhythm RBBB and LPFB No significant change since last tracing Confirmed by Fredia Sorrow [phone]) on 04/02/2019 10:33:31 PM   Radiology DG Chest Port 1 View  Result Date: 04/02/2019 CLINICAL DATA:  Weakness and suspicion for COVID-19 positivity EXAM: PORTABLE CHEST 1 VIEW COMPARISON:  11/07/2018 FINDINGS: Cardiac shadow is stable. Postsurgical changes are noted. Patchy airspace opacities are noted bilaterally consistent with atypical pneumonia, possible COVID-19. No sizable effusion is seen. No bony abnormality is noted. IMPRESSION: Bilateral airspace opacities consistent with atypical pneumonia, possible COVID-19. Electronically Signed   By: Inez Catalina M.D.   On:  04/02/2019 22:41    Procedures Procedures (including critical care time)  CRITICAL CARE Performed by: Fredia Sorrow Total critical care time: 30 minutes Critical care time was exclusive of separately billable procedures and treating other patients. Critical care was necessary to treat or prevent imminent or life-threatening deterioration. Critical care was time spent personally by me on the following activities: development of treatment plan with patient and/or surrogate as well as nursing, discussions with consultants, evaluation of patient's response to treatment, examination of patient, obtaining history from patient or surrogate, ordering and performing treatments and interventions, ordering and review of laboratory studies, ordering and review of radiographic studies, pulse oximetry and re-evaluation of patient's condition.   Medications Ordered in ED Medications  sodium chloride flush (NS) 0.9 % injection 3 mL (has no administration in time range)  acetaminophen (TYLENOL) tablet 1,000 mg (has no administration in time range)  0.9 %  sodium chloride infusion (has no administration in time range)    ED Course  I have reviewed the triage vital signs and the nursing notes.  Pertinent labs & imaging results that were available during my care of the patient were reviewed by me and considered in my medical decision making (see chart for details).    MDM Rules/Calculators/A&P  Patient with 2 episodes of syncope.  Also Covid positive.  Oxygen saturations room air 92%.  Chest x-ray results pending.  Covid labs ordered.  Patient will require admission probably for the syncope cardiac monitoring as well as well ambulate in the room but oxygen saturations seem to be consistently below 94%.  Labs still pending.  Chest x-ray consistent with bilateral atypical pneumonia.  Consistent with COVID-19 infection.  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  Not tachycardic.  Patient reported a history of black bowel movements.  Says has had it on and off is been more frequent recently.  Blood pressure 1 obtain when he was up we got a systolic of like 88.  Laid him flat got a systolic of [id number].  Patient will get 500 cc bolus of fluid.  While labs are still pending.  Type and cross done.  But Hemoccult was trace positive blood but no gross blood no melena.  In addition patient's oxygen saturations on room air to go down to 88%.  Patient placed on 2 L.  Oxygen sats back up to 96%.  Patient still will require admission.  For the hypoxia chest x-ray consistent with COVID-19 positive COVID-19 infection.  As well as the 2 syncopal episodes.  Additional labs will be important prior to admission.   Final Clinical Impression(s) / ED Diagnoses Final diagnoses:  COVID-19 virus infection  Syncope, unspecified syncope type    Rx / DC Orders ED Discharge Orders    None       Fredia Sorrow, MD 04/02/19 [id number]    Fredia Sorrow, MD 04/02/19 [phone]

## 2019-04-02 NOTE — ED Triage Notes (Signed)
Pt C/O weakness X 1 day. Pt states has not been able to eat or drink normally today. Denies pain. Living with family member who is COVID positive

## 2019-04-03 ENCOUNTER — Inpatient Hospital Stay (HOSPITAL_COMMUNITY): Payer: No Typology Code available for payment source

## 2019-04-03 DIAGNOSIS — R55 Syncope and collapse: Secondary | ICD-10-CM

## 2019-04-03 DIAGNOSIS — J9601 Acute respiratory failure with hypoxia: Secondary | ICD-10-CM | POA: Diagnosis not present

## 2019-04-03 DIAGNOSIS — Z951 Presence of aortocoronary bypass graft: Secondary | ICD-10-CM | POA: Diagnosis not present

## 2019-04-03 DIAGNOSIS — B1920 Unspecified viral hepatitis C without hepatic coma: Secondary | ICD-10-CM | POA: Diagnosis not present

## 2019-04-03 DIAGNOSIS — A419 Sepsis, unspecified organism: Secondary | ICD-10-CM

## 2019-04-03 DIAGNOSIS — I252 Old myocardial infarction: Secondary | ICD-10-CM | POA: Diagnosis not present

## 2019-04-03 DIAGNOSIS — E119 Type 2 diabetes mellitus without complications: Secondary | ICD-10-CM | POA: Diagnosis not present

## 2019-04-03 DIAGNOSIS — I509 Heart failure, unspecified: Secondary | ICD-10-CM | POA: Diagnosis not present

## 2019-04-03 DIAGNOSIS — Z8249 Family history of ischemic heart disease and other diseases of the circulatory system: Secondary | ICD-10-CM | POA: Diagnosis not present

## 2019-04-03 DIAGNOSIS — G8929 Other chronic pain: Secondary | ICD-10-CM | POA: Diagnosis not present

## 2019-04-03 DIAGNOSIS — K921 Melena: Secondary | ICD-10-CM | POA: Diagnosis not present

## 2019-04-03 DIAGNOSIS — Z87891 Personal history of nicotine dependence: Secondary | ICD-10-CM | POA: Diagnosis not present

## 2019-04-03 DIAGNOSIS — F329 Major depressive disorder, single episode, unspecified: Secondary | ICD-10-CM | POA: Diagnosis not present

## 2019-04-03 DIAGNOSIS — A4189 Other specified sepsis: Secondary | ICD-10-CM | POA: Diagnosis present

## 2019-04-03 DIAGNOSIS — K7460 Unspecified cirrhosis of liver: Secondary | ICD-10-CM | POA: Diagnosis not present

## 2019-04-03 DIAGNOSIS — U071 COVID-19: Secondary | ICD-10-CM | POA: Diagnosis present

## 2019-04-03 DIAGNOSIS — I2510 Atherosclerotic heart disease of native coronary artery without angina pectoris: Secondary | ICD-10-CM | POA: Diagnosis not present

## 2019-04-03 DIAGNOSIS — J1282 Pneumonia due to coronavirus disease 2019: Secondary | ICD-10-CM

## 2019-04-03 DIAGNOSIS — Z8673 Personal history of transient ischemic attack (TIA), and cerebral infarction without residual deficits: Secondary | ICD-10-CM | POA: Diagnosis not present

## 2019-04-03 DIAGNOSIS — E785 Hyperlipidemia, unspecified: Secondary | ICD-10-CM | POA: Diagnosis not present

## 2019-04-03 DIAGNOSIS — Z23 Encounter for immunization: Secondary | ICD-10-CM | POA: Diagnosis not present

## 2019-04-03 DIAGNOSIS — K219 Gastro-esophageal reflux disease without esophagitis: Secondary | ICD-10-CM | POA: Diagnosis not present

## 2019-04-03 DIAGNOSIS — J45909 Unspecified asthma, uncomplicated: Secondary | ICD-10-CM | POA: Diagnosis not present

## 2019-04-03 DIAGNOSIS — I110 Hypertensive heart disease with heart failure: Secondary | ICD-10-CM | POA: Diagnosis not present

## 2019-04-03 DIAGNOSIS — M549 Dorsalgia, unspecified: Secondary | ICD-10-CM | POA: Diagnosis not present

## 2019-04-03 DIAGNOSIS — Z833 Family history of diabetes mellitus: Secondary | ICD-10-CM | POA: Diagnosis not present

## 2019-04-03 DIAGNOSIS — G43109 Migraine with aura, not intractable, without status migrainosus: Secondary | ICD-10-CM | POA: Diagnosis not present

## 2019-04-03 LAB — CBC
HCT: 40.5 % (ref 39.0–52.0)
Hemoglobin: 13.2 g/dL (ref 13.0–17.0)
MCH: 28.4 pg (ref 26.0–34.0)
MCHC: 32.6 g/dL (ref 30.0–36.0)
MCV: 87.3 fL (ref 80.0–100.0)
Platelets: 109 10*3/uL — ABNORMAL LOW (ref 150–400)
RBC: 4.64 MIL/uL (ref 4.22–5.81)
RDW: 14.7 % (ref 11.5–15.5)
WBC: 8.6 10*3/uL (ref 4.0–10.5)
nRBC: 0 % (ref 0.0–0.2)

## 2019-04-03 LAB — PROCALCITONIN: Procalcitonin: 0.85 ng/mL

## 2019-04-03 LAB — ECHOCARDIOGRAM COMPLETE
Height: 75 in
Weight: 3661.4 oz

## 2019-04-03 LAB — HIV ANTIBODY (ROUTINE TESTING W REFLEX): HIV Screen 4th Generation wRfx: NONREACTIVE

## 2019-04-03 LAB — CBG MONITORING, ED: Glucose-Capillary: 89 mg/dL (ref 70–99)

## 2019-04-03 LAB — GLUCOSE, CAPILLARY
Glucose-Capillary: 141 mg/dL — ABNORMAL HIGH (ref 70–99)
Glucose-Capillary: 208 mg/dL — ABNORMAL HIGH (ref 70–99)
Glucose-Capillary: 190 mg/dL — ABNORMAL HIGH (ref 70–99)
Glucose-Capillary: 141 mg/dL — ABNORMAL HIGH (ref 70–99)

## 2019-04-03 LAB — TYPE AND SCREEN
ABO/RH(D): O POS
Antibody Screen: NEGATIVE

## 2019-04-03 LAB — ABO/RH: ABO/RH(D): O POS

## 2019-04-03 LAB — MRSA PCR SCREENING: MRSA by PCR: NEGATIVE

## 2019-04-03 LAB — C-REACTIVE PROTEIN: CRP: 16.3 mg/dL — ABNORMAL HIGH (ref <1.0)

## 2019-04-03 LAB — HEMOGLOBIN A1C
Hgb A1c MFr Bld: 5.4 % (ref 4.8–5.6)
Mean Plasma Glucose: 108.28 mg/dL

## 2019-04-03 LAB — TROPONIN I (HIGH SENSITIVITY)
Troponin I (High Sensitivity): 24 ng/L — ABNORMAL HIGH (ref <18)
Troponin I (High Sensitivity): 18 ng/L — ABNORMAL HIGH (ref <18)

## 2019-04-03 LAB — FERRITIN: Ferritin: 169 ng/mL (ref 24–336)

## 2019-04-03 LAB — LACTIC ACID, PLASMA: Lactic Acid, Venous: 1.5 mmol/L (ref 0.5–1.9)

## 2019-04-03 MED ORDER — SODIUM CHLORIDE 0.9 % IV SOLN: 100.0000 mg | Freq: Every day | INTRAVENOUS | Status: DC

## 2019-04-03 MED ORDER — VENLAFAXINE HCL ER 75 MG PO CP24
225.0000 mg | ORAL_CAPSULE | Freq: Every day | ORAL | Status: DC
  Administered 2019-04-03 – 2019-04-05 (×3): 225 mg via ORAL
  Filled 2019-04-03 (×3): qty 3

## 2019-04-03 MED ORDER — FUROSEMIDE 40 MG PO TABS: 40.0000 mg | ORAL_TABLET | Freq: Every day | ORAL | Status: DC

## 2019-04-03 MED ORDER — POLYVINYL ALCOHOL 1.4 % OP SOLN
1.0000 [drp] | OPHTHALMIC | Status: DC | PRN
  Administered 2019-04-03: 1 [drp] via OPHTHALMIC
  Filled 2019-04-03: qty 15

## 2019-04-03 MED ORDER — PNEUMOCOCCAL VAC POLYVALENT 25 MCG/0.5ML IJ INJ
0.5000 mL | INJECTION | INTRAMUSCULAR | Status: AC
  Administered 2019-04-04: 0.5 mL via INTRAMUSCULAR
  Filled 2019-04-03: qty 0.5

## 2019-04-03 MED ORDER — ATORVASTATIN CALCIUM 10 MG PO TABS
10.0000 mg | ORAL_TABLET | Freq: Every day | ORAL | Status: DC
  Administered 2019-04-03 – 2019-04-04 (×3): 10 mg via ORAL
  Filled 2019-04-03 (×3): qty 1

## 2019-04-03 MED ORDER — ONDANSETRON HCL 4 MG/2ML IJ SOLN: 4.0000 mg | Freq: Four times a day (QID) | INTRAMUSCULAR | Status: DC | PRN

## 2019-04-03 MED ORDER — NOREPINEPHRINE 4 MG/250ML-% IV SOLN: 0.0000 ug/min | INTRAVENOUS | Status: DC

## 2019-04-03 MED ORDER — ONDANSETRON HCL 4 MG PO TABS: 4.0000 mg | ORAL_TABLET | Freq: Four times a day (QID) | ORAL | Status: DC | PRN

## 2019-04-03 MED ORDER — SENNOSIDES-DOCUSATE SODIUM 8.6-50 MG PO TABS
2.0000 | ORAL_TABLET | Freq: Every day | ORAL | Status: DC
  Administered 2019-04-03 – 2019-04-04 (×2): 2 via ORAL
  Filled 2019-04-03 (×5): qty 2

## 2019-04-03 MED ORDER — PANTOPRAZOLE SODIUM 40 MG PO TBEC
40.0000 mg | DELAYED_RELEASE_TABLET | Freq: Every day | ORAL | Status: DC
  Administered 2019-04-03 – 2019-04-05 (×3): 40 mg via ORAL
  Filled 2019-04-03 (×2): qty 1

## 2019-04-03 MED ORDER — SODIUM CHLORIDE 0.9 % IV SOLN
100.0000 mg | Freq: Every day | INTRAVENOUS | Status: DC
  Administered 2019-04-04 – 2019-04-05 (×2): 100 mg via INTRAVENOUS
  Filled 2019-04-03 (×3): qty 20

## 2019-04-03 MED ORDER — SODIUM CHLORIDE 0.9 % IV SOLN: 200.0000 mg | Freq: Once | INTRAVENOUS | Status: DC

## 2019-04-03 MED ORDER — GABAPENTIN 300 MG PO CAPS
900.0000 mg | ORAL_CAPSULE | Freq: Three times a day (TID) | ORAL | Status: DC
  Administered 2019-04-03 – 2019-04-05 (×7): 900 mg via ORAL
  Filled 2019-04-03 (×8): qty 3

## 2019-04-03 MED ORDER — TRAMADOL HCL 50 MG PO TABS: 100.0000 mg | ORAL_TABLET | Freq: Three times a day (TID) | ORAL | Status: DC

## 2019-04-03 MED ORDER — SODIUM CHLORIDE 0.9 % IV BOLUS
250.0000 mL | Freq: Once | INTRAVENOUS | Status: AC
  Administered 2019-04-03: 250 mL via INTRAVENOUS

## 2019-04-03 MED ORDER — TRAMADOL HCL 50 MG PO TABS
100.0000 mg | ORAL_TABLET | Freq: Three times a day (TID) | ORAL | Status: DC | PRN
  Administered 2019-04-03 – 2019-04-04 (×4): 100 mg via ORAL
  Filled 2019-04-03 (×4): qty 2

## 2019-04-03 MED ORDER — CHLORHEXIDINE GLUCONATE CLOTH 2 % EX PADS
6.0000 | MEDICATED_PAD | Freq: Every day | CUTANEOUS | Status: DC
  Administered 2019-04-03 – 2019-04-04 (×2): 6 via TOPICAL

## 2019-04-03 MED ORDER — INSULIN ASPART 100 UNIT/ML ~~LOC~~ SOLN
0.0000 [IU] | Freq: Three times a day (TID) | SUBCUTANEOUS | Status: DC
  Administered 2019-04-03: 2 [IU] via SUBCUTANEOUS
  Administered 2019-04-03: 08:00:00 1 [IU] via SUBCUTANEOUS
  Administered 2019-04-03: 12:00:00 3 [IU] via SUBCUTANEOUS
  Administered 2019-04-04: 2 [IU] via SUBCUTANEOUS
  Administered 2019-04-04: 3 [IU] via SUBCUTANEOUS
  Administered 2019-04-04 – 2019-04-05 (×2): 2 [IU] via SUBCUTANEOUS

## 2019-04-03 MED ORDER — ALBUTEROL SULFATE HFA 108 (90 BASE) MCG/ACT IN AERS: 2.0000 | INHALATION_SPRAY | RESPIRATORY_TRACT | Status: DC | PRN

## 2019-04-03 MED ORDER — ENOXAPARIN SODIUM 40 MG/0.4ML ~~LOC~~ SOLN
40.0000 mg | SUBCUTANEOUS | Status: DC
  Administered 2019-04-03 – 2019-04-04 (×2): 40 mg via SUBCUTANEOUS
  Filled 2019-04-03 (×2): qty 0.4

## 2019-04-03 MED ORDER — DEXAMETHASONE SODIUM PHOSPHATE 10 MG/ML IJ SOLN
6.0000 mg | INTRAMUSCULAR | Status: DC
  Administered 2019-04-03 – 2019-04-05 (×3): 6 mg via INTRAVENOUS
  Filled 2019-04-03 (×3): qty 1

## 2019-04-03 MED ORDER — SUCRALFATE 1 GM/10ML PO SUSP: 1.0000 g | Freq: Two times a day (BID) | ORAL | Status: DC

## 2019-04-03 MED ORDER — VANCOMYCIN HCL 2000 MG/400ML IV SOLN
2000.0000 mg | INTRAVENOUS | Status: DC
  Filled 2019-04-03: qty 400

## 2019-04-03 MED ORDER — INSULIN GLARGINE 100 UNIT/ML ~~LOC~~ SOLN
44.0000 [IU] | Freq: Every day | SUBCUTANEOUS | Status: DC
  Administered 2019-04-03 – 2019-04-04 (×2): 44 [IU] via SUBCUTANEOUS
  Filled 2019-04-03 (×3): qty 0.44

## 2019-04-03 MED ORDER — SODIUM CHLORIDE 0.9 % IV SOLN: INTRAVENOUS | Status: AC

## 2019-04-03 MED ORDER — POTASSIUM CHLORIDE CRYS ER 20 MEQ PO TBCR
20.0000 meq | EXTENDED_RELEASE_TABLET | Freq: Every day | ORAL | Status: DC
  Administered 2019-04-03 – 2019-04-05 (×2): 20 meq via ORAL
  Filled 2019-04-03 (×3): qty 1

## 2019-04-03 MED ORDER — OXYBUTYNIN CHLORIDE 5 MG PO TABS
5.0000 mg | ORAL_TABLET | Freq: Two times a day (BID) | ORAL | Status: DC
  Administered 2019-04-03 – 2019-04-05 (×5): 5 mg via ORAL
  Filled 2019-04-03 (×8): qty 1

## 2019-04-03 MED ORDER — INFLUENZA VAC SPLIT QUAD 0.5 ML IM SUSY
0.5000 mL | PREFILLED_SYRINGE | INTRAMUSCULAR | Status: AC
  Administered 2019-04-04: 0.5 mL via INTRAMUSCULAR
  Filled 2019-04-03: qty 0.5

## 2019-04-03 MED ORDER — FINASTERIDE 5 MG PO TABS
5.0000 mg | ORAL_TABLET | Freq: Every day | ORAL | Status: DC
  Administered 2019-04-03 – 2019-04-05 (×3): 5 mg via ORAL
  Filled 2019-04-03 (×5): qty 1

## 2019-04-03 MED ORDER — VANCOMYCIN HCL 2000 MG/400ML IV SOLN
2000.0000 mg | Freq: Once | INTRAVENOUS | Status: AC
  Administered 2019-04-03: 2000 mg via INTRAVENOUS
  Filled 2019-04-03: qty 400

## 2019-04-03 MED ORDER — MESALAMINE 1000 MG RE SUPP
1000.0000 mg | Freq: Every day | RECTAL | Status: DC
  Administered 2019-04-04: 1000 mg via RECTAL
  Filled 2019-04-03 (×3): qty 1

## 2019-04-03 MED ORDER — PIPERACILLIN-TAZOBACTAM 3.375 G IVPB
3.3750 g | Freq: Three times a day (TID) | INTRAVENOUS | Status: DC
  Administered 2019-04-03 – 2019-04-05 (×6): 3.375 g via INTRAVENOUS
  Filled 2019-04-03 (×5): qty 50

## 2019-04-03 MED ORDER — PIPERACILLIN-TAZOBACTAM 3.375 G IVPB 30 MIN
3.3750 g | Freq: Once | INTRAVENOUS | Status: AC
  Administered 2019-04-03: 3.375 g via INTRAVENOUS
  Filled 2019-04-03: qty 50

## 2019-04-03 MED ORDER — MIRTAZAPINE 15 MG PO TABS
7.5000 mg | ORAL_TABLET | Freq: Every day | ORAL | Status: DC
  Administered 2019-04-03 – 2019-04-04 (×3): 7.5 mg via ORAL
  Filled 2019-04-03 (×3): qty 1

## 2019-04-03 MED ORDER — VANCOMYCIN HCL IN DEXTROSE 1-5 GM/200ML-% IV SOLN
1000.0000 mg | Freq: Two times a day (BID) | INTRAVENOUS | Status: DC
  Administered 2019-04-03 – 2019-04-04 (×2): 1000 mg via INTRAVENOUS
  Filled 2019-04-03: qty 200

## 2019-04-03 MED ORDER — SODIUM CHLORIDE 0.9 % IV SOLN
100.0000 mg | INTRAVENOUS | Status: AC
  Administered 2019-04-03 (×2): 100 mg via INTRAVENOUS
  Filled 2019-04-03 (×4): qty 20

## 2019-04-03 MED ORDER — LATANOPROST 0.005 % OP SOLN
1.0000 [drp] | Freq: Every day | OPHTHALMIC | Status: DC
  Administered 2019-04-03 – 2019-04-04 (×3): 1 [drp] via OPHTHALMIC
  Filled 2019-04-03 (×2): qty 2.5

## 2019-04-03 MED ORDER — MAGNESIUM OXIDE 400 (241.3 MG) MG PO TABS
400.0000 mg | ORAL_TABLET | Freq: Every day | ORAL | Status: DC
  Administered 2019-04-03 – 2019-04-05 (×3): 400 mg via ORAL
  Filled 2019-04-03 (×3): qty 1

## 2019-04-03 NOTE — ED Notes (Signed)
Date and time results received: 04/03/19 0002 (use smartphrase ".now" to insert current time)  Test: lactic acid Critical Value: 2.2  Name of Provider Notified: dr Dina Rich  Orders Received? Or Actions Taken?: Actions Taken: no orders received

## 2019-04-03 NOTE — Progress Notes (Signed)
*  PRELIMINARY RESULTS* Echocardiogram 2D Echocardiogram has been performed.  Samuel Germany 04/03/2019, 9:24 AM

## 2019-04-03 NOTE — H&P (Signed)
TRH H&P    Patient Demographics:    [name], is a 59 y.o. male  MRN: [id number]  DOB - 12-Jul-1960  Admit Date - 04/02/2019  Referring MD/NP/PA: Thayer Jew  Outpatient Primary MD for the patient is [facility] [place]  Patient coming from: Home  Chief complaint-passed out   HPI:    [name]  is a 59 y.o. male, with history of diabetes mellitus type 2, CAD s/p CABG x3 vessel, GERD, hypertension, hyperlipidemia, depression was brought to hospital after patient had episode of syncope x2 at home.  Patient says that he had decreased appetite, fever and body aches.  He was exposed to his sister who has coronavirus.  Today in the ED chest x-ray shows bilateral pneumonia, he was found to have positive COVID-19, SARS-CoV-2 RT-PCR. Patient also was found to have positive orthostasis, was given 500 cc normal saline bolus.  His blood pressure continues to be soft. He denies nausea vomiting or diarrhea. Denies abdominal pain or dysuria Denies shortness of breath, but has been coughing up green-colored phlegm. Denies chest pain    Review of systems:    In addition to the HPI above,    All other systems reviewed and are negative.    Past History of the following :    Past Medical History:  Diagnosis Date  . Asthma   . Cancer [facility])    prostate  . Chronic back pain   . Chronic neck pain   . Cirrhosis of liver ([place])   . Complicated migraine [id number]  . Coronary artery disease   . Depression   . Diabetes mellitus without complication ([place])   . Gallstones   . Headache(784.0)   . Hepatitis C   . Hypercholesteremia   . Hypertension   . Left leg paresthesias   . Myocardial infarction ([place])   . Stroke [facility])    TIA  . Vertigo       Past Surgical History:  Procedure Laterality Date  . BYPASS GRAFT     triple bypass surgery  . CARDIAC SURGERY    . COLONOSCOPY WITH PROPOFOL  N/A 02/15/2017   Procedure: COLONOSCOPY WITH PROPOFOL;  Surgeon: Virgel Manifold, MD;  Location: [place];  Service: Endoscopy;  Laterality: N/A;  . CORONARY ARTERY BYPASS GRAFT    . ESOPHAGOGASTRODUODENOSCOPY (EGD) WITH PROPOFOL N/A 02/15/2017   Procedure: ESOPHAGOGASTRODUODENOSCOPY (EGD) WITH PROPOFOL;  Surgeon: Virgel Manifold, MD;  Location: [place];  Service: Endoscopy;  Laterality: N/A;  . HAND SURGERY  1984  . LEFT HEART CATH AND CORONARY ANGIOGRAPHY Left 04/29/2017   Procedure: LEFT HEART CATH AND CORONARY ANGIOGRAPHY;  Surgeon: Dionisio David, MD;  Location: [place] [facility];  Service: Cardiovascular;  Laterality: Left;  . LEFT HEART CATH AND CORONARY ANGIOGRAPHY Left 01/08/2019   Procedure: LEFT HEART CATH AND CORONARY ANGIOGRAPHY;  Surgeon: Dionisio David, MD;  Location: [place] [facility];  Service: Cardiovascular;  Laterality: Left;  . ORTHOPEDIC SURGERY     "on my legs"  Social History:      Social History   Tobacco Use  . Smoking status: Former Smoker    Packs/day: 1.00    Years: 35.00    Pack years: 35.00    Quit date: 01/25/2009    Years since quitting: 10.1  . Smokeless tobacco: Never Used  Substance Use Topics  . Alcohol use: No    Comment: former alcoholic       Family History :     Family History  Problem Relation Age of Onset  . Diabetes Brother   . Diabetes Sister   . Diabetes Father   . Hypertension Father   . Diabetes Mother   . Hypertension Mother   . Hypertension Sister   . Hypertension Brother       Home Medications:   Prior to Admission medications   Medication Sig Start Date End Date Taking? Authorizing Provider  albuterol (PROVENTIL HFA;VENTOLIN HFA) 108 (90 Base) MCG/ACT inhaler Inhale 2 puffs into the lungs 3 (three) times daily as needed for wheezing or shortness of breath.    [provider]  amLODipine (NORVASC) 10 MG tablet Take 5 mg by mouth daily.    [provider]   aspirin EC 81 MG tablet Take 81 mg by mouth daily.    [provider]  atorvastatin (LIPITOR) 20 MG tablet Take 10 mg by mouth at bedtime.     [provider]  bisacodyl (DULCOLAX) 5 MG EC tablet Take 10 mg by mouth at bedtime.    [provider]  Calcium Carbonate-Vitamin D (CALCIUM 600+D) 600-400 MG-UNIT tablet Take 1 tablet by mouth 2 (two) times daily.     [provider]  carvedilol (COREG) 25 MG tablet Take 25 mg by mouth 2 (two) times daily with a meal.    [provider]  cetirizine (ZYRTEC) 10 MG tablet Take 10 mg by mouth 2 (two) times daily.     [provider]  Cholecalciferol (VITAMIN D3) 1000 units CAPS Take 1,000 Units by mouth daily.     [provider]  doxycycline (VIBRAMYCIN) 100 MG capsule Take 1 capsule (100 mg total) by mouth 2 (two) times daily. 03/06/18   Andrew Meadow, PA-C  finasteride (PROSCAR) 5 MG tablet Take 5 mg by mouth daily.    [provider]  fluticasone (FLONASE) 50 MCG/ACT nasal spray Place 2 sprays into both nostrils daily.     [provider]  furosemide (LASIX) 40 MG tablet Take 40 mg by mouth daily.    [provider]  gabapentin (NEURONTIN) 300 MG capsule Take 900 mg by mouth 3 (three) times daily.     [provider]  hydrocerin (EUCERIN) CREA Apply 1 application topically 2 (two) times daily.    [provider]  insulin aspart (NOVOLOG) 100 UNIT/ML FlexPen Inject 10 Units into the skin 3 (three) times daily with meals.     [provider]  insulin glargine (LANTUS) 100 UNIT/ML injection Inject 44 Units into the skin at bedtime.     [provider]  latanoprost (XALATAN) 0.005 % ophthalmic solution Place 1 drop into both eyes at bedtime.    [provider]  magnesium oxide (MAG-OX) 400 MG tablet Take 400 mg by mouth daily.    [provider]  Melatonin 5 MG TABS Take 5 mg by mouth at bedtime.    [provider]  mesalamine (CANASA) 1000 MG suppository Place 1,000 mg rectally at bedtime.  [provider]  metFORMIN (GLUCOPHAGE) 500 MG tablet Take 500 mg by mouth daily with breakfast.     [provider]  mirtazapine (REMERON) 7.5 MG tablet Take 7.5 mg by mouth at bedtime.    [provider]  naloxone [facility]) nasal spray 4 mg/0.1 mL Place 1 spray into the nose.    [provider]  oxybutynin (DITROPAN) 5 MG tablet Take 5 mg by mouth 2 (two) times daily.     [provider]  potassium chloride SA (K-DUR,KLOR-CON) 20 MEQ tablet Take 20 mEq by mouth daily.    [provider]  sennosides-docusate sodium (SENOKOT-S) 8.6-50 MG tablet Take 2 tablets by mouth daily.    [provider]  sucralfate (CARAFATE) 1 GM/10ML suspension Place 1 g rectally 2 (two) times daily.    [provider]  terazosin (HYTRIN) 2 MG capsule Take 4 mg by mouth at bedtime.    [provider]  traMADol (ULTRAM) 50 MG tablet Take 100 mg by mouth 3 (three) times daily.    [provider]  venlafaxine (EFFEXOR) 75 MG tablet Take 225 mg by mouth daily.     [provider]     Allergies:     Allergies  Allergen Reactions  . Bee Venom Anaphylaxis and Swelling  . Questran [Cholestyramine]      Physical Exam:   Vitals  Blood pressure 96/64, pulse 78, temperature (!) 100.4 F (38 C), temperature source Oral, resp. rate 18, weight 110.7 kg, SpO2 95 %.  1.  General: Appears in no acute distress  2. Psychiatric: Alert, oriented x3, intact insight and judgment  3. Neurologic: Cranial nerves II through grossly intact, no focal deficit noted  4. HEENMT:  Atraumatic normocephalic, extraocular muscles are intact  5. Respiratory : Bibasilar crackles, left more than right  6. Cardiovascular : S1-S2, regular, no murmur auscultated  7. Gastrointestinal:  Abdomen is soft, nontender, no organomegaly     Data  Review:    CBC Recent Labs  Lab 04/02/19 2304  WBC 8.6  HGB 13.2  HCT 40.5  PLT 109*  MCV 87.3  MCH 28.4  MCHC 32.6  RDW 14.7   ------------------------------------------------------------------------------------------------------------------  Results for orders placed or performed during the hospital encounter of 04/02/19 (from the past 48 hour(s))  POC SARS Coronavirus 2 Ag-ED -     Status: Abnormal   Collection Time: 04/02/19 10:41 PM  Result Value Ref Range   SARS Coronavirus 2 Ag POSITIVE (A) NEGATIVE    Comment: (NOTE) SARS-CoV-2 antigen PRESENT. Positive results indicate the presence of viral antigens, but clinical correlation with patient history and other diagnostic information is necessary to determine patient infection status.  Positive results do not rule out bacterial infection or co-infection  with other viruses. False positive results are rare but can occur, and confirmatory RT-PCR testing may be appropriate in some circumstances. The expected result is Negative. Fact Sheet for Patients: PodPark.tn Fact Sheet for Providers: GiftContent.is  This test is not yet approved or cleared by the [place] FDA and  has been authorized for detection and/or diagnosis of SARS-CoV-2 by FDA under an Emergency Use Authorization (EUA).  This EUA will remain in effect (meaning this test can be used) for the duration of  the COVID-19 declaration under Section 564(b)(1) of the Act, 21 U.S.C. section 360bbb-3(b)(1), unless the a uthorization is terminated or revoked sooner.   CBC     Status: Abnormal   Collection Time: 04/02/19 11:04 PM  Result  Value Ref Range   WBC 8.6 4.0 - 10.5 K/uL   RBC 4.64 4.22 - 5.81 MIL/uL   Hemoglobin 13.2 13.0 - 17.0 g/dL   HCT 40.5 39.0 - 52.0 %   MCV 87.3 80.0 - 100.0 fL   MCH 28.4 26.0 - 34.0 pg   MCHC 32.6 30.0 - 36.0 g/dL   RDW 14.7 11.5 - 15.5 %   Platelets 109 (L) 150 - 400  K/uL    Comment: PLATELET COUNT CONFIRMED BY SMEAR SPECIMEN CHECKED FOR CLOTS    nRBC 0.0 0.0 - 0.2 %    Comment: Performed at [facility], [address] [place], [place] [address]  Comprehensive metabolic panel     Status: Abnormal   Collection Time: 04/02/19 11:04 PM  Result Value Ref Range   Sodium 135 135 - 145 mmol/L   Potassium 4.0 3.5 - 5.1 mmol/L   Chloride 101 98 - 111 mmol/L   CO2 22 22 - 32 mmol/L   Glucose, Bld 91 70 - 99 mg/dL    Comment: Glucose reference range applies only to samples taken after fasting for at least 8 hours.   BUN 14 6 - 20 mg/dL   Creatinine, Ser 1.65 (H) 0.61 - 1.24 mg/dL   Calcium 8.3 (L) 8.9 - 10.3 mg/dL   Total Protein 6.8 6.5 - 8.1 g/dL   Albumin 3.6 3.5 - 5.0 g/dL   AST 42 (H) 15 - 41 U/L   ALT 27 0 - 44 U/L   Alkaline Phosphatase 34 (L) 38 - 126 U/L   Total Bilirubin 0.9 0.3 - 1.2 mg/dL   GFR calc non Af Amer 44 (L) >60 mL/min   GFR calc Af Amer 52 (L) >60 mL/min   Anion gap 12 5 - 15    Comment: Performed at [facility], [address] [place], [place] [address]  Brain natriuretic peptide     Status: None   Collection Time: 04/02/19 11:04 PM  Result Value Ref Range   B Natriuretic Peptide 93.0 0.0 - 100.0 pg/mL    Comment: Performed at [facility], [address], [place], [place] [address]  Lactic acid, plasma     Status: Abnormal   Collection Time: 04/02/19 11:04 PM  Result Value Ref Range   Lactic Acid, Venous 2.2 (HH) 0.5 - 1.9 mmol/L    Comment: CRITICAL RESULT CALLED TO, READ BACK BY AND VERIFIED WITH: K BELTON,RN @2357  04/02/19 [facility] Performed at [facility], [address] [place], [place] [address]   D-dimer, quantitative     Status: None   Collection Time: 04/02/19 11:04 PM  Result Value Ref Range   D-Dimer, Quant 0.34 0.00 - 0.50 ug/mL-FEU    Comment: (NOTE) At the manufacturer cut-off of 0.50 ug/mL FEU, this assay has been documented to exclude PE with a sensitivity and negative predictive value of 97 to 99%.   At this time, this assay has not been approved by the FDA to exclude DVT/VTE. Results should be correlated with clinical presentation. Performed at [facility], [address], [place], [place] [address]   Procalcitonin     Status: None   Collection Time: 04/02/19 11:04 PM  Result Value Ref Range   Procalcitonin 0.85 ng/mL    Comment:        Interpretation: PCT > 0.5 ng/mL and <= 2 ng/mL: Systemic infection (sepsis) is possible, but other conditions are known to elevate PCT as well. (NOTE)       Sepsis PCT Algorithm  Lower Respiratory Tract                                      Infection PCT Algorithm    ----------------------------     ----------------------------         PCT < 0.25 ng/mL                PCT < 0.10 ng/mL         Strongly encourage             Strongly discourage   discontinuation of antibiotics    initiation of antibiotics    ----------------------------     -----------------------------       PCT 0.25 - 0.50 ng/mL            PCT 0.10 - 0.25 ng/mL               OR       >80% decrease in PCT            Discourage initiation of                                            antibiotics      Encourage discontinuation           of antibiotics    ----------------------------     -----------------------------         PCT >= 0.50 ng/mL              PCT 0.26 - 0.50 ng/mL                AND       <80% decrease in PCT             Encourage initiation of                                             antibiotics       Encourage continuation           of antibiotics    ----------------------------     -----------------------------        PCT >= 0.50 ng/mL                  PCT > 0.50 ng/mL               AND         increase in PCT                  Strongly encourage                                      initiation of antibiotics    Strongly encourage escalation           of antibiotics                                     -----------------------------  PCT <= 0.25 ng/mL                                                 OR                                        > 80% decrease in PCT                                     Discontinue / Do not initiate                                             antibiotics Performed at [facility], [address], [place], [place] [address]   Lactate dehydrogenase     Status: Abnormal   Collection Time: 04/02/19 11:04 PM  Result Value Ref Range   LDH 197 (H) 98 - 192 U/L    Comment: Performed at [facility], [address] [place], [place] [address]  Ferritin     Status: None   Collection Time: 04/02/19 11:04 PM  Result Value Ref Range   Ferritin 169 24 - 336 ng/mL    Comment: Performed at [facility], [address], [place], [place] [address]  Triglycerides     Status: None   Collection Time: 04/02/19 11:04 PM  Result Value Ref Range   Triglycerides 123 <150 mg/dL    Comment: Performed at [facility], [address], [place], [place] [address]  Fibrinogen     Status: Abnormal   Collection Time: 04/02/19 11:04 PM  Result Value Ref Range   Fibrinogen 565 (H) 210 - 475 mg/dL    Comment: Performed at [facility], [address] [place], [place] [address]  C-reactive protein     Status: Abnormal   Collection Time: 04/02/19 11:04 PM  Result Value Ref Range   CRP 16.3 (H) <1.0 mg/dL    Comment: Performed at [facility], [address] [place], [place] [address]  Type and screen [facility]     Status: None   Collection Time: 04/02/19 11:04 PM  Result Value Ref Range   ABO/RH(D) O POS    Antibody Screen NEG    Sample Expiration      04/05/2019,2359 Performed at [facility], [address] [place],  [address]   POC occult blood, ED     Status: Abnormal   Collection Time: 04/02/19 11:26 PM  Result Value Ref Range   Fecal Occult Bld POSITIVE (A) NEGATIVE  CBG monitoring, ED     Status: None   Collection Time: 04/03/19 12:20 AM  Result Value Ref Range    Glucose-Capillary 89 70 - 99 mg/dL    Comment: Glucose reference range applies only to samples taken after fasting for at least 8 hours.    Chemistries  Recent Labs  Lab 04/02/19 2304  NA 135  K 4.0  CL 101  CO2 22  GLUCOSE 91  BUN 14  CREATININE 1.65*  CALCIUM 8.3*  AST 42*  ALT 27  ALKPHOS 34*  BILITOT 0.9   ------------------------------------------------------------------------------------------------------------------  ------------------------------------------------------------------------------------------------------------------  GFR: Estimated Creatinine Clearance: 64 mL/min (A) (by C-G formula based on SCr of 1.65 mg/dL (H)). Liver Function Tests: Recent Labs  Lab 04/02/19 2304  AST 42*  ALT 27  ALKPHOS 34*  BILITOT 0.9  PROT 6.8  ALBUMIN 3.6   CBG: Recent Labs  Lab 04/03/19 0020  GLUCAP 13   Lipid Profile: Recent Labs    04/02/19 2304  TRIG 123   Thyroid Function Tests: No results for input(s): TSH, T4TOTAL, FREET4, T3FREE, THYROIDAB in the last 72 hours. Anemia Panel: Recent Labs    04/02/19 2304  FERRITIN 169    --------------------------------------------------------------------------------------------------------------- Urine analysis:    Component Value Date/Time   COLORURINE YELLOW 03/06/2018 1850   APPEARANCEUR CLEAR 03/06/2018 1850   LABSPEC 1.006 03/06/2018 1850   PHURINE 7.0 03/06/2018 1850   GLUCOSEU NEGATIVE 03/06/2018 1850   HGBUR LARGE (A) 03/06/2018 1850   BILIRUBINUR NEGATIVE 03/06/2018 1850   KETONESUR NEGATIVE 03/06/2018 1850   PROTEINUR NEGATIVE 03/06/2018 1850   UROBILINOGEN 0.2 10/11/2012 0443   NITRITE NEGATIVE 03/06/2018 1850   LEUKOCYTESUR NEGATIVE 03/06/2018 1850      Imaging Results:    DG Chest Port 1 View  Result Date: 04/02/2019 CLINICAL DATA:  Weakness and suspicion for COVID-19 positivity EXAM: PORTABLE CHEST 1 VIEW COMPARISON:  11/07/2018 FINDINGS: Cardiac shadow is stable. Postsurgical changes  are noted. Patchy airspace opacities are noted bilaterally consistent with atypical pneumonia, possible COVID-19. No sizable effusion is seen. No bony abnormality is noted. IMPRESSION: Bilateral airspace opacities consistent with atypical pneumonia, possible COVID-19. Electronically Signed   By: Inez Catalina M.D.   On: 04/02/2019 22:41     COVID-19 Labs  Recent Labs    04/02/19 2304  DDIMER 0.34  FERRITIN 169  LDH 197*  CRP 16.3*    No results found for: SARSCOV2NAA My personal review of EKG: Rhythm NSR, RBBB   Assessment & Plan:    Active Problems:   Pneumonia due to COVID-19 virus   1. Acute hypoxic respiratory failure-patient O2 sats 86% on room air, started on 2 L/min of oxygen via nasal cannula.  He has positive COVID-19, SARS RT-PCR.  Will start remdesivir per pharmacy consultation, Decadron 6 mg IV daily.  D-dimer 0.34, CRP elevated 16.3.  If no improvement consider Actemra in next 24 hours. 2. Syncope-secondary to orthostatic hypotension.  Patient blood pressure dropped to 80s on standing.  Will hold amlodipine, Coreg, Lasix at this time. 3. CAD s/p CABG-patient takes aspirin, Coreg, Lasix at home.  All these medications are currently on hold.  He does have bibasilar crackles and tells me that he has history of CHF.  Will obtain echocardiogram in a.m. 4. Diabetes mellitus type 2-continue Lantus 44 units subcu daily.  Will start sliding scale insulin with NovoLog.  Check CBG before every meal and at bedtime. 5. History of melena-patient complains of history of black bowel movements, stool guaiac was positive in the ED.  Hemoglobin is stable at 13.2.  Will monitor patient's hemoglobin and transfuse as needed.  He will need GI evaluation once he is more stable.   DVT Prophylaxis-   Lovenox   AM Labs Ordered, also please review Full Orders  Family Communication: Admission, patients condition and plan of care including tests being ordered have been discussed with the patient   who indicate understanding and agree with the plan and Code Status.  Code Status: Full code  Admission status: Inpatient :The appropriate admission status for this patient is INPATIENT. Inpatient status is judged to  be reasonable and necessary in order to provide the required intensity of service to ensure the patient's safety. The patient's presenting symptoms, physical exam findings, and initial radiographic and laboratory data in the context of their chronic comorbidities is felt to place them at high risk for further clinical deterioration. Furthermore, it is not anticipated that the patient will be medically stable for discharge from the hospital within 2 midnights of admission. The following factors support the admission status of inpatient.     The patient's presenting symptoms include syncope The worrisome physical exam findings include hypoxia, bibasilar crackles on auscultation The initial radiographic and laboratory data are worrisome because of COVID-19 pneumonia. The chronic co-morbidities include CAD s/p CABG.       * I certify that at the point of admission it is my clinical judgment that the patient will require inpatient hospital care spanning beyond 2 midnights from the point of admission due to high intensity of service, high risk for further deterioration and high frequency of surveillance required.*  Time spent in minutes : 60 minutes   Kamaree Berkel S Khari Lett M.D

## 2019-04-03 NOTE — Progress Notes (Signed)
PROLONGED SERVICES NOTE   04/03/2019 12:46 PM  [name] was seen and examined in stepdown ICU.  The H&P by the admitting provider, orders, imaging was reviewed.  Please see new orders.  This patient was admitted with sepsis, Covid pneumonia and presumed superimposed bacterial pneumonia given elevated procalcitonin levels.  He was hypotensive initially but responded to IV fluid hydration and holding home blood pressure medications.  He has been started on treatment for Covid pneumonia and bacterial pneumonia.  Follow cultures.  He is hypothermic and started on supportive treatments.  Will continue to follow closely in stepdown ICU.  Please see updated orders.  Vitals:   04/03/19 1200 04/03/19 1215  BP: 99/69 96/70  Pulse: 69 67  Resp: 16 17  Temp:  (!) 96 F (35.6 C)  SpO2: 93% 91%    Results for orders placed or performed during the hospital encounter of 04/02/19  Blood Culture (routine x 2)   Specimen: BLOOD  Result Value Ref Range   Specimen Description BLOOD RIGHT ANTECUBITAL    Special Requests      BOTTLES DRAWN AEROBIC AND ANAEROBIC Blood Culture adequate volume   Culture      NO GROWTH < 12 HOURS Performed at [facility], [address], [place], [place] [address]    Report Status PENDING   Blood Culture (routine x 2)   Specimen: BLOOD  Result Value Ref Range   Specimen Description BLOOD LEFT ANTECUBITAL    Special Requests      BOTTLES DRAWN AEROBIC AND ANAEROBIC Blood Culture adequate volume   Culture      NO GROWTH < 12 HOURS Performed at [facility], [address], [place], [place] [address]    Report Status PENDING   MRSA PCR Screening   Specimen: Nasal Mucosa; Nasopharyngeal  Result Value Ref Range   MRSA by PCR NEGATIVE NEGATIVE  CBC  Result Value Ref Range   WBC 8.6 4.0 - 10.5 K/uL   RBC 4.64 4.22 - 5.81 MIL/uL   Hemoglobin 13.2 13.0 - 17.0 g/dL   HCT 40.5 39.0 - 52.0 %   MCV 87.3 80.0 - 100.0 fL   MCH 28.4 26.0 - 34.0 pg   MCHC 32.6 30.0 -  36.0 g/dL   RDW 14.7 11.5 - 15.5 %   Platelets 109 (L) 150 - 400 K/uL   nRBC 0.0 0.0 - 0.2 %  Comprehensive metabolic panel  Result Value Ref Range   Sodium 135 135 - 145 mmol/L   Potassium 4.0 3.5 - 5.1 mmol/L   Chloride 101 98 - 111 mmol/L   CO2 22 22 - 32 mmol/L   Glucose, Bld 91 70 - 99 mg/dL   BUN 14 6 - 20 mg/dL   Creatinine, Ser 1.65 (H) 0.61 - 1.24 mg/dL   Calcium 8.3 (L) 8.9 - 10.3 mg/dL   Total Protein 6.8 6.5 - 8.1 g/dL   Albumin 3.6 3.5 - 5.0 g/dL   AST 42 (H) 15 - 41 U/L   ALT 27 0 - 44 U/L   Alkaline Phosphatase 34 (L) 38 - 126 U/L   Total Bilirubin 0.9 0.3 - 1.2 mg/dL   GFR calc non Af Amer 44 (L) >60 mL/min   GFR calc Af Amer 52 (L) >60 mL/min   Anion gap 12 5 - 15  Brain natriuretic peptide  Result Value Ref Range   B Natriuretic Peptide 93.0 0.0 - 100.0 pg/mL  Lactic acid, plasma  Result Value Ref Range  Lactic Acid, Venous 2.2 (HH) 0.5 - 1.9 mmol/L  Lactic acid, plasma  Result Value Ref Range   Lactic Acid, Venous 1.5 0.5 - 1.9 mmol/L  D-dimer, quantitative  Result Value Ref Range   D-Dimer, Quant 0.34 0.00 - 0.50 ug/mL-FEU  Procalcitonin  Result Value Ref Range   Procalcitonin 0.85 ng/mL  Lactate dehydrogenase  Result Value Ref Range   LDH 197 (H) 98 - 192 U/L  Ferritin  Result Value Ref Range   Ferritin 169 24 - 336 ng/mL  Triglycerides  Result Value Ref Range   Triglycerides 123 <150 mg/dL  Fibrinogen  Result Value Ref Range   Fibrinogen 565 (H) 210 - 475 mg/dL  C-reactive protein  Result Value Ref Range   CRP 16.3 (H) <1.0 mg/dL  HIV Antibody (routine testing w rflx)  Result Value Ref Range   HIV Screen 4th Generation wRfx NON REACTIVE NON REACTIVE  Hemoglobin A1c  Result Value Ref Range   Hgb A1c MFr Bld 5.4 4.8 - 5.6 %   Mean Plasma Glucose 108.28 mg/dL  Glucose, capillary  Result Value Ref Range   Glucose-Capillary 141 (H) 70 - 99 mg/dL  Glucose, capillary  Result Value Ref Range   Glucose-Capillary 208 (H) 70 - 99 mg/dL   CBG monitoring, ED  Result Value Ref Range   Glucose-Capillary 89 70 - 99 mg/dL  POC SARS Coronavirus 2 Ag-ED -  Result Value Ref Range   SARS Coronavirus 2 Ag POSITIVE (A) NEGATIVE  POC occult blood, ED  Result Value Ref Range   Fecal Occult Bld POSITIVE (A) NEGATIVE  ECHOCARDIOGRAM COMPLETE  Result Value Ref Range   Weight 3,661.4 oz   Height 75 in   BP 96/63 mmHg  Type and screen [facility]  Result Value Ref Range   ABO/RH(D) O POS    Antibody Screen NEG    Sample Expiration      04/05/2019,2359 Performed at [facility], [address] [place], [place] [address]   ABO/Rh  Result Value Ref Range   ABO/RH(D)      O POS Performed at [facility], [address], [place], [place] [address]   Troponin I (High Sensitivity)  Result Value Ref Range   Troponin I (High Sensitivity) 24 (H) <18 ng/L  Troponin I (High Sensitivity)  Result Value Ref Range   Troponin I (High Sensitivity) 18 (H) <18 ng/L   Time Spent: 31 minutes   Murvin Natal, MD [facility]   04/02/2019 10:09 PM How to contact the [facility] Attending or Consulting provider [place] or covering provider during after hours 7P -7A, for this patient?  1. Check the care team in [facility] and look for a) attending/consulting TR[facility] provider listed and b) the [facility] team listed 2. Log into [email or web address] and use 's universal password to access. If you do not have the password, please contact the hospital operator. 3. Locate the [facility] provider you are looking for under [facility] and page to a number that you can be directly reached. 4. If you still have difficulty reaching the provider, please page the [facility] (Director on Call) for the Hospitalists listed on amion for assistance.

## 2019-04-03 NOTE — ED Notes (Signed)
Ambulated pt o2 started at 94%. Walked back and and forth in the room o2 while walking was 88% to 92%. Ending o2 was 96%.

## 2019-04-03 NOTE — ED Notes (Signed)
Pt given saltine cracker, peanut butter, and grape juice to drink.

## 2019-04-03 NOTE — ED Notes (Signed)
Pt BP decreased when changing positions. MD Zackowski aware.

## 2019-04-03 NOTE — Progress Notes (Signed)
Pharmacy Antibiotic Note  [name] is a 59 y.o. male admitted on 04/02/2019 with sepsis.  Pharmacy has been consulted for vancomycin and zosyn dosing.  Plan: Zosyn 3.375g IV q8h (4 hour infusion).  Vancomycin 2gm IV x 1 then q24 hours F/u renal function, cultures and clincial course  Weight: 244 lb 0.8 oz (110.7 kg)  Temp (24hrs), Avg:99.3 F (37.4 C), Min:98.1 F (36.7 C), Max:100.4 F (38 C)  Recent Labs  Lab 04/02/19 2304 04/03/19 0133  WBC 8.6  --   CREATININE 1.65*  --   LATICACIDVEN 2.2* 1.5    Estimated Creatinine Clearance: 64 mL/min (A) (by C-G formula based on SCr of 1.65 mg/dL (H)).    Allergies  Allergen Reactions  . Bee Venom Anaphylaxis and Swelling  . Questran [Cholestyramine]     Thank you for allowing pharmacy to be a part of this patient's care.  Excell Seltzer Poteet 04/03/2019 4:15 AM

## 2019-04-03 NOTE — ED Notes (Signed)
Pt placed on 2L O2 via nasal cannula

## 2019-04-04 DIAGNOSIS — A4189 Other specified sepsis: Secondary | ICD-10-CM | POA: Diagnosis not present

## 2019-04-04 LAB — COMPREHENSIVE METABOLIC PANEL
ALT: 24 U/L (ref 0–44)
AST: 32 U/L (ref 15–41)
Albumin: 3.1 g/dL — ABNORMAL LOW (ref 3.5–5.0)
Alkaline Phosphatase: 49 U/L (ref 38–126)
Anion gap: 7 (ref 5–15)
BUN: 20 mg/dL (ref 6–20)
CO2: 27 mmol/L (ref 22–32)
Calcium: 8.3 mg/dL — ABNORMAL LOW (ref 8.9–10.3)
Chloride: 106 mmol/L (ref 98–111)
Creatinine, Ser: 1.15 mg/dL (ref 0.61–1.24)
GFR calc Af Amer: >60 mL/min (ref >60)
GFR calc non Af Amer: >60 mL/min (ref >60)
Glucose, Bld: 165 mg/dL — ABNORMAL HIGH (ref 70–99)
Potassium: 5 mmol/L (ref 3.5–5.1)
Sodium: 140 mmol/L (ref 135–145)
Total Bilirubin: 0.6 mg/dL (ref 0.3–1.2)
Total Protein: 6.5 g/dL (ref 6.5–8.1)

## 2019-04-04 LAB — URINALYSIS, ROUTINE W REFLEX MICROSCOPIC
Bilirubin Urine: NEGATIVE
Glucose, UA: NEGATIVE mg/dL
Hgb urine dipstick: NEGATIVE
Ketones, ur: NEGATIVE mg/dL
Leukocytes,Ua: NEGATIVE
Nitrite: NEGATIVE
Protein, ur: NEGATIVE mg/dL
Specific Gravity, Urine: 1.029 (ref 1.005–1.030)
pH: 5 (ref 5.0–8.0)

## 2019-04-04 LAB — CBC
HCT: 41.9 % (ref 39.0–52.0)
Hemoglobin: 13.6 g/dL (ref 13.0–17.0)
MCH: 27.9 pg (ref 26.0–34.0)
MCHC: 32.5 g/dL (ref 30.0–36.0)
MCV: 86 fL (ref 80.0–100.0)
Platelets: 138 10*3/uL — ABNORMAL LOW (ref 150–400)
RBC: 4.87 MIL/uL (ref 4.22–5.81)
RDW: 14.8 % (ref 11.5–15.5)
WBC: 8 10*3/uL (ref 4.0–10.5)
nRBC: 0 % (ref 0.0–0.2)

## 2019-04-04 LAB — GLUCOSE, CAPILLARY
Glucose-Capillary: 171 mg/dL — ABNORMAL HIGH (ref 70–99)
Glucose-Capillary: 245 mg/dL — ABNORMAL HIGH (ref 70–99)
Glucose-Capillary: 195 mg/dL — ABNORMAL HIGH (ref 70–99)
Glucose-Capillary: 214 mg/dL — ABNORMAL HIGH (ref 70–99)

## 2019-04-04 LAB — PROCALCITONIN: Procalcitonin: 0.42 ng/mL

## 2019-04-04 LAB — D-DIMER, QUANTITATIVE: D-Dimer, Quant: <0.27 ug/mL-FEU (ref 0.00–0.50)

## 2019-04-04 LAB — C-REACTIVE PROTEIN: CRP: 12.3 mg/dL — ABNORMAL HIGH (ref <1.0)

## 2019-04-04 LAB — FERRITIN: Ferritin: 178 ng/mL (ref 24–336)

## 2019-04-04 MED ORDER — PHENOL 1.4 % MT LIQD
1.0000 | OROMUCOSAL | Status: DC | PRN
  Administered 2019-04-04: 1 via OROMUCOSAL
  Filled 2019-04-04: qty 177

## 2019-04-04 NOTE — Clinical Social Work Note (Signed)
[place] notification securely emailed to [employer].     Searra Carnathan, Clydene Pugh, LCSW

## 2019-04-04 NOTE — Progress Notes (Signed)
PROGRESS NOTE    [name]  [id number] DOB: Dec 07, 1960 DOA: 04/02/2019 PCP: [facility]   Brief Narrative:  Per HPI: [name]  is a 59 y.o. male, with history of diabetes mellitus type 2, CAD s/p CABG x3 vessel, GERD, hypertension, hyperlipidemia, depression was brought to hospital after patient had episode of syncope x2 at home.  Patient says that he had decreased appetite, fever and body aches.  He was exposed to his sister who has coronavirus.  Today in the ED chest x-ray shows bilateral pneumonia, he was found to have positive COVID-19, SARS-CoV-2 RT-PCR. Patient also was found to have positive orthostasis, was given 500 cc normal saline bolus.  His blood pressure continues to be soft. He denies nausea vomiting or diarrhea. Denies abdominal pain or dysuria Denies shortness of breath, but has been coughing up green-colored phlegm. Denies chest pain  3/10: Patient was admitted with acute hypoxemic respiratory failure secondary to COVID-19 pneumonia.  He appears to be doing better with improvements and inflammatory markers noted.  Continue current treatments and transfer out of ICU today.  No dark or tarry stools otherwise noted.  Anticipate discharge in a.m.  Assessment & Plan:   Principal Problem:   Sepsis ([place]) Active Problems:   COVID-19 virus infection   1. Acute hypoxic respiratory failure-patient O2 sats 86% on room air, started on 2 L/min of oxygen via nasal cannula.  He has positive COVID-19.  Continue Decadron and remdesivir.  Vancomycin and Zosyn also started on account of presumed superimposed bacterial pneumonia with procalcitonin elevation noted.  Vancomycin has been discontinued as MRSA PCR is negative.  Continue Zosyn for now. 2. Syncope-secondary to orthostatic hypotension.  Patient blood pressure dropped to 80s on standing.  Will hold amlodipine, Coreg, Lasix at this time. 3. CAD s/p CABG-patient takes aspirin, Coreg, Lasix at home.   All these medications are currently on hold.  He does have bibasilar crackles and tells me that he has history of CHF.    2D echocardiogram without any acute findings and LVEF 55-60%. 4. Diabetes mellitus type 2-continue Lantus 44 units subcu daily.  Will start sliding scale insulin with NovoLog.  Check CBG before every meal and at bedtime. 5. History of melena-patient complains of history of black bowel movements, stool guaiac was positive in the ED.  Hemoglobin is stable at 13.2.    Stable hemoglobin levels noted and no further dark stools.  GI evaluation in outpatient setting would be appropriate.   DVT prophylaxis: Lovenox Code Status: Full Family Communication: None at bedside Disposition Plan: Continue to monitor for ongoing improvement and anticipate discharge in a.m. if stable.   Consultants:   None  Procedures:   See below  Antimicrobials:  Anti-infectives (From admission, onward)   Start     Dose/Rate Route Frequency Ordered Stop   04/04/19 1000  remdesivir 100 mg in sodium chloride 0.9 % 100 mL IVPB  Status:  Discontinued     100 mg 200 mL/hr over 30 Minutes Intravenous Daily 04/03/19 0127 04/03/19 0148   04/04/19 1000  remdesivir 100 mg in sodium chloride 0.9 % 100 mL IVPB     100 mg 200 mL/hr over 30 Minutes Intravenous Daily 04/03/19 0155 04/08/19 0959   04/04/19 0400  vancomycin (VANCOREADY) IVPB 2000 mg/400 mL  Status:  Discontinued     2,000 mg 200 mL/hr over 120 Minutes Intravenous Every 24 hours 04/03/19 0419 04/03/19 0924   04/03/19 1700  vancomycin (VANCOCIN) IVPB 1000 mg/200 mL  premix  Status:  Discontinued     1,000 mg 200 mL/hr over 60 Minutes Intravenous Every 12 hours 04/03/19 0924 04/04/19 0739   04/03/19 1230  piperacillin-tazobactam (ZOSYN) IVPB 3.375 g     3.375 g 12.5 mL/hr over 240 Minutes Intravenous Every 8 hours 04/03/19 0416     04/03/19 0415  vancomycin (VANCOREADY) IVPB 2000 mg/400 mL     2,000 mg 200 mL/hr over 120 Minutes Intravenous   Once 04/03/19 0414 04/03/19 0649   04/03/19 0415  piperacillin-tazobactam (ZOSYN) IVPB 3.375 g     3.375 g 100 mL/hr over 30 Minutes Intravenous  Once 04/03/19 0414 04/03/19 0505   04/03/19 0300  remdesivir 100 mg in sodium chloride 0.9 % 100 mL IVPB     100 mg 200 mL/hr over 30 Minutes Intravenous Every 30 min 04/03/19 0149 04/03/19 0342   04/03/19 0130  remdesivir 200 mg in sodium chloride 0.9% 250 mL IVPB  Status:  Discontinued     200 mg 580 mL/hr over 30 Minutes Intravenous Once 04/03/19 0127 04/03/19 0148       Subjective: Patient seen and evaluated today with no new acute complaints or concerns. No acute concerns or events noted overnight.  He had bowel movement yesterday that was not dark.  He states that he is starting to feel much better.  Objective: Vitals:   04/04/19 0800 04/04/19 0950 04/04/19 1100 04/04/19 1449  BP: 130/81   129/82  Pulse: 66   64  Resp: 17   18  Temp:   97.8 F (36.6 C) 97.9 F (36.6 C)  TempSrc:   Oral Oral  SpO2: 93% (!) 88% 90%   Weight:      Height:        Intake/Output Summary (Last 24 hours) at 04/04/2019 1632 Last data filed at 04/04/2019 1307 Gross per 24 hour  Intake 1075.59 ml  Output 1250 ml  Net -174.41 ml   Filed Weights   04/02/19 2211 04/03/19 0754 04/04/19 0100  Weight: 110.7 kg 103.8 kg 105.6 kg    Examination:  General exam: Appears calm and comfortable  Respiratory system: Clear to auscultation. Respiratory effort normal.  Currently on 3 L nasal cannula oxygen. Cardiovascular system: S1 & S2 heard, RRR. No JVD, murmurs, rubs, gallops or clicks. No pedal edema. Gastrointestinal system: Abdomen is nondistended, soft and nontender. No organomegaly or masses felt. Normal bowel sounds heard. Central nervous system: Alert and oriented. No focal neurological deficits. Extremities: Symmetric 5 x 5 power. Skin: No rashes, lesions or ulcers Psychiatry: Judgement and insight appear normal. Mood & affect appropriate.      Data Reviewed: I have personally reviewed following labs and imaging studies  CBC: Recent Labs  Lab 04/02/19 2304 04/04/19 0741  WBC 8.6 8.0  HGB 13.2 13.6  HCT 40.5 41.9  MCV 87.3 86.0  PLT 109* 0000000*   Basic Metabolic Panel: Recent Labs  Lab 04/02/19 2304 04/04/19 0741  NA 135 140  K 4.0 5.0  CL 101 106  CO2 22 27  GLUCOSE 91 165*  BUN 14 20  CREATININE 1.65* 1.15  CALCIUM 8.3* 8.3*   GFR: Estimated Creatinine Clearance: 89.8 mL/min (by C-G formula based on SCr of 1.15 mg/dL). Liver Function Tests: Recent Labs  Lab 04/02/19 2304 04/04/19 0741  AST 42* 32  ALT 27 24  ALKPHOS 34* 49  BILITOT 0.9 0.6  PROT 6.8 6.5  ALBUMIN 3.6 3.1*   No results for input(s): LIPASE, AMYLASE in the last 168 hours.  No results for input(s): AMMONIA in the last 168 hours. Coagulation Profile: No results for input(s): INR, PROTIME in the last 168 hours. Cardiac Enzymes: No results for input(s): CKTOTAL, CKMB, CKMBINDEX, TROPONINI in the last 168 hours. BNP (last 3 results) No results for input(s): PROBNP in the last 8760 hours. HbA1C: Recent Labs    04/02/19 2304  HGBA1C 5.4   CBG: Recent Labs  Lab 04/03/19 1210 04/03/19 1647 04/03/19 1943 04/04/19 0817 04/04/19 1147  GLUCAP 208* 190* 141* 171* 245*   Lipid Profile: Recent Labs    04/02/19 2304  TRIG 123   Thyroid Function Tests: No results for input(s): TSH, T4TOTAL, FREET4, T3FREE, THYROIDAB in the last 72 hours. Anemia Panel: Recent Labs    04/02/19 2304 04/04/19 0425  FERRITIN 169 178   Sepsis Labs: Recent Labs  Lab 04/02/19 2304 04/03/19 0133  PROCALCITON 0.85  --   LATICACIDVEN 2.2* 1.5    Recent Results (from the past 240 hour(s))  Blood Culture (routine x 2)     Status: None (Preliminary result)   Collection Time: 04/02/19 11:04 PM   Specimen: BLOOD  Result Value Ref Range Status   Specimen Description BLOOD RIGHT ANTECUBITAL  Final   Special Requests   Final    BOTTLES DRAWN  AEROBIC AND ANAEROBIC Blood Culture adequate volume   Culture   Final    NO GROWTH 2 DAYS Performed at [facility], [address], [place], [place] [address]    Report Status PENDING  Incomplete  Blood Culture (routine x 2)     Status: None (Preliminary result)   Collection Time: 04/02/19 11:09 PM   Specimen: BLOOD  Result Value Ref Range Status   Specimen Description BLOOD LEFT ANTECUBITAL  Final   Special Requests   Final    BOTTLES DRAWN AEROBIC AND ANAEROBIC Blood Culture adequate volume   Culture   Final    NO GROWTH 2 DAYS Performed at [facility], [address] [place], [place] [address]    Report Status PENDING  Incomplete  MRSA PCR Screening     Status: None   Collection Time: 04/03/19  7:21 AM   Specimen: Nasal Mucosa; Nasopharyngeal  Result Value Ref Range Status   MRSA by PCR NEGATIVE NEGATIVE Final    Comment:        The GeneXpert MRSA Assay (FDA approved for NASAL specimens only), is one component of a comprehensive MRSA colonization surveillance program. It is not intended to diagnose MRSA infection nor to guide or monitor treatment for MRSA infections. Performed at [facility], [address] [place], [place] [address]          Radiology Studies: [facility] Chest [facility] Dt 1 View  Result Date: 04/02/2019 CLINICAL DATA:  Weakness and suspicion for COVID-19 positivity EXAM: PORTABLE CHEST 1 VIEW COMPARISON:  11/07/2018 FINDINGS: Cardiac shadow is stable. Postsurgical changes are noted. Patchy airspace opacities are noted bilaterally consistent with atypical pneumonia, possible COVID-19. No sizable effusion is seen. No bony abnormality is noted. IMPRESSION: Bilateral airspace opacities consistent with atypical pneumonia, possible COVID-19. Electronically Signed   By: Inez Catalina M.D.   On: 04/02/2019 22:41   ECHOCARDIOGRAM COMPLETE  Result Date: 04/03/2019    ECHOCARDIOGRAM REPORT   Patient Name:   [name] Date of Exam: 04/03/2019 Medical Rec #:  [id number]           Height:       75.0 in Accession #:    [id number]  Weight:       228.8 lb Date of Birth:  03-29-1960          BSA:          2.324 m Patient Age:    34 years           BP:           92/64 mmHg Patient Gender: M                  HR:           67 bpm. Exam Location:  [facility] Procedure: 2D Echo, Cardiac Doppler and Color Doppler Indications:    Congestive Heart Failure 428.0 / I50.9  History:        Patient has prior history of Echocardiogram examinations, most                 recent 10/11/2012. Previous Myocardial Infarction, Prior CABG;                 Risk Factors:Hypertension, Dyslipidemia and Diabetes. Pneumonia                 due to COVID-19 virus,CVA.  Sonographer:    Alvino Chapel RCS Referring Phys: [place]  1. Left ventricular ejection fraction, by estimation, is 55 to 60%. The left ventricle has normal function. The left ventricle has no regional wall motion abnormalities. There is mild left ventricular hypertrophy. Left ventricular diastolic parameters are indeterminate.  2. Right ventricular systolic function is normal. The right ventricular size is normal. Tricuspid regurgitation signal is inadequate for assessing PA pressure.  3. The mitral valve is grossly normal. Trivial mitral valve regurgitation.  4. The aortic valve is tricuspid. Aortic valve regurgitation is not visualized.  5. The inferior vena cava is normal in size with greater than 50% respiratory variability, suggesting right atrial pressure of 3 mmHg. FINDINGS  Left Ventricle: Left ventricular ejection fraction, by estimation, is 55 to 60%. The left ventricle has normal function. The left ventricle has no regional wall motion abnormalities. The left ventricular internal cavity size was normal in size. There is  mild left ventricular hypertrophy. Left ventricular diastolic parameters are indeterminate. Right Ventricle: The right ventricular size is normal. No increase in right ventricular wall thickness.  Right ventricular systolic function is normal. Tricuspid regurgitation signal is inadequate for assessing PA pressure. Left Atrium: Left atrial size was normal in size. Right Atrium: Right atrial size was normal in size. Pericardium: There is no evidence of pericardial effusion. Mitral Valve: The mitral valve is grossly normal. Trivial mitral valve regurgitation. Tricuspid Valve: The tricuspid valve is grossly normal. Tricuspid valve regurgitation is trivial. Aortic Valve: The aortic valve is tricuspid. Aortic valve regurgitation is not visualized. Mild aortic valve annular calcification. Pulmonic Valve: The pulmonic valve was grossly normal. Pulmonic valve regurgitation is trivial. Aorta: The aortic root is normal in size and structure. Venous: The inferior vena cava is normal in size with greater than 50% respiratory variability, suggesting right atrial pressure of 3 mmHg. IAS/Shunts: No atrial level shunt detected by color flow Doppler.  LEFT VENTRICLE PLAX 2D LVIDd:         4.54 cm  Diastology LVIDs:         3.02 cm  LV e' lateral:   8.70 cm/s LV PW:         1.12 cm  LV E/e' lateral: 9.8 LV IVS:        1.35 cm  LV e' medial:    7.51 cm/s LVOT diam:     2.10 cm  LV E/e' medial:  11.3 LV SV:         74 LV SV Index:   32 LVOT Area:     3.46 cm  RIGHT VENTRICLE RV S prime:     8.70 cm/s TAPSE (M-mode): 1.6 cm LEFT ATRIUM             Index       RIGHT ATRIUM           Index LA diam:        4.30 cm 1.85 cm/m  RA Area:     17.70 cm LA Vol (A2C):   63.9 ml 27.50 ml/m RA Volume:   48.90 ml  21.04 ml/m LA Vol (A4C):   64.4 ml 27.71 ml/m LA Biplane Vol: 66.1 ml 28.45 ml/m  AORTIC VALVE LVOT Vmax:   107.00 cm/s LVOT Vmean:  69.400 cm/s LVOT VTI:    0.214 m  AORTA Ao Root diam: 3.80 cm MITRAL VALVE MV Area (PHT): 3.42 cm    SHUNTS MV Decel Time: 222 msec    Systemic VTI:  0.21 m MV E velocity: 85.20 cm/s  Systemic Diam: 2.10 cm MV A velocity: 85.90 cm/s MV E/A ratio:  0.99 Rozann Lesches MD Electronically signed by  Rozann Lesches MD Signature Date/Time: 04/03/2019/10:02:51 AM    Final         Scheduled Meds: . atorvastatin  10 mg Oral QHS  . Chlorhexidine Gluconate Cloth  6 each Topical Daily  . dexamethasone (DECADRON) injection  6 mg Intravenous Q24H  . enoxaparin (LOVENOX) injection  40 mg Subcutaneous Q24H  . finasteride  5 mg Oral Daily  . gabapentin  900 mg Oral TID  . insulin aspart  0-9 Units Subcutaneous TID WC  . insulin glargine  44 Units Subcutaneous QHS  . latanoprost  1 drop Both Eyes QHS  . magnesium oxide  400 mg Oral Daily  . mesalamine  1,000 mg Rectal QHS  . mirtazapine  7.5 mg Oral QHS  . oxybutynin  5 mg Oral BID  . pantoprazole  40 mg Oral Daily  . potassium chloride SA  20 mEq Oral Daily  . senna-docusate  2 tablet Oral Daily  . sodium chloride flush  3 mL Intravenous Once  . venlafaxine XR  225 mg Oral Daily   Continuous Infusions: . norepinephrine (LEVOPHED) Adult infusion Stopped (04/03/19 0600)  . piperacillin-tazobactam (ZOSYN)  IV 12.5 mL/hr at 04/04/19 1307  . remdesivir 100 mg in NS 100 mL Stopped (04/04/19 0907)     LOS: 1 day    Time spent: 30 minutes    Andrew Saffran Darleen Crocker, DO [facility] Pager [phone]  If 7PM-7AM, please contact night-coverage [email or web address] Password [facility] 04/04/2019, 4:32 PM

## 2019-04-05 ENCOUNTER — Ambulatory Visit (HOSPITAL_COMMUNITY): Payer: No Typology Code available for payment source

## 2019-04-05 LAB — COMPREHENSIVE METABOLIC PANEL
ALT: 27 U/L (ref 0–44)
AST: 31 U/L (ref 15–41)
Albumin: 3 g/dL — ABNORMAL LOW (ref 3.5–5.0)
Alkaline Phosphatase: 46 U/L (ref 38–126)
Anion gap: 8 (ref 5–15)
BUN: 18 mg/dL (ref 6–20)
CO2: 25 mmol/L (ref 22–32)
Calcium: 7.9 mg/dL — ABNORMAL LOW (ref 8.9–10.3)
Chloride: 107 mmol/L (ref 98–111)
Creatinine, Ser: 0.99 mg/dL (ref 0.61–1.24)
GFR calc Af Amer: >60 mL/min (ref >60)
GFR calc non Af Amer: >60 mL/min (ref >60)
Glucose, Bld: 155 mg/dL — ABNORMAL HIGH (ref 70–99)
Potassium: 3.8 mmol/L (ref 3.5–5.1)
Sodium: 140 mmol/L (ref 135–145)
Total Bilirubin: 0.6 mg/dL (ref 0.3–1.2)
Total Protein: 6.4 g/dL — ABNORMAL LOW (ref 6.5–8.1)

## 2019-04-05 LAB — CBC
HCT: 40.8 % (ref 39.0–52.0)
Hemoglobin: 13.4 g/dL (ref 13.0–17.0)
MCH: 28 pg (ref 26.0–34.0)
MCHC: 32.8 g/dL (ref 30.0–36.0)
MCV: 85.4 fL (ref 80.0–100.0)
Platelets: 160 10*3/uL (ref 150–400)
RBC: 4.78 MIL/uL (ref 4.22–5.81)
RDW: 14.8 % (ref 11.5–15.5)
WBC: 9.1 10*3/uL (ref 4.0–10.5)
nRBC: 0 % (ref 0.0–0.2)

## 2019-04-05 LAB — GLUCOSE, CAPILLARY: Glucose-Capillary: 152 mg/dL — ABNORMAL HIGH (ref 70–99)

## 2019-04-05 LAB — PROCALCITONIN: Procalcitonin: 0.36 ng/mL

## 2019-04-05 LAB — FERRITIN: Ferritin: 189 ng/mL (ref 24–336)

## 2019-04-05 LAB — C-REACTIVE PROTEIN: CRP: 5 mg/dL — ABNORMAL HIGH (ref <1.0)

## 2019-04-05 LAB — D-DIMER, QUANTITATIVE: D-Dimer, Quant: 0.3 ug/mL-FEU (ref 0.00–0.50)

## 2019-04-05 MED ORDER — PANTOPRAZOLE SODIUM 40 MG PO TBEC: 40.0000 mg | DELAYED_RELEASE_TABLET | Freq: Every day | ORAL | 0 refills | Status: DC

## 2019-04-05 MED ORDER — GUAIFENESIN-DM 100-10 MG/5ML PO SYRP: 5.0000 mL | ORAL_SOLUTION | ORAL | 0 refills | Status: DC | PRN

## 2019-04-05 MED ORDER — ISOSORBIDE MONONITRATE ER 60 MG PO TB24
30.0000 mg | ORAL_TABLET | Freq: Every day | ORAL | Status: DC
  Administered 2019-04-05: 10:00:00 30 mg via ORAL
  Filled 2019-04-05: qty 1

## 2019-04-05 MED ORDER — ALBUTEROL SULFATE HFA 108 (90 BASE) MCG/ACT IN AERS: 2.0000 | INHALATION_SPRAY | Freq: Four times a day (QID) | RESPIRATORY_TRACT | 1 refills | Status: AC | PRN

## 2019-04-05 MED ORDER — METOPROLOL SUCCINATE ER 25 MG PO TB24
25.0000 mg | ORAL_TABLET | Freq: Every day | ORAL | Status: DC
  Filled 2019-04-05: qty 1

## 2019-04-05 MED ORDER — AMOXICILLIN-POT CLAVULANATE 875-125 MG PO TABS: 1.0000 | ORAL_TABLET | Freq: Two times a day (BID) | ORAL | 0 refills | Status: AC

## 2019-04-05 MED ORDER — LISINOPRIL 10 MG PO TABS
20.0000 mg | ORAL_TABLET | Freq: Every day | ORAL | Status: DC
  Administered 2019-04-05: 10:00:00 20 mg via ORAL
  Filled 2019-04-05: qty 2

## 2019-04-05 MED ORDER — DEXAMETHASONE 6 MG PO TABS: 6.0000 mg | ORAL_TABLET | Freq: Every day | ORAL | 0 refills | Status: AC

## 2019-04-05 NOTE — Discharge Summary (Signed)
Physician Discharge Summary  [name] RCV:[id number] DOB: 02-11-1960 DOA: 04/02/2019  PCP: [facility]  Admit date: 04/02/2019  Discharge date: 04/05/2019  Admitted From:Home  Disposition:  Home  Recommendations for Outpatient Follow-up:  1. Follow up with PCP in 1-2 weeks 2. Follow-up with GI in the outpatient setting in the next 2 weeks due to positive FOBT and dark stools while here 3. Continue with outpatient remdesivir infusions for 2 more days on 3/12 and 3/13 at 10 AM as scheduled at [facility] 4. Continue on Decadron for 7 more days as prescribed along with albuterol inhaler as needed for shortness of breath or wheezing 5. Continue on Augmentin for 2 more days to finish total 5-day course for pneumonia.  Home Health: None  Equipment/Devices: None  Discharge Condition: Stable  CODE STATUS: Full  Diet recommendation: Heart Healthy/carb modified  Brief/Interim Summary: Per HPI: ThomasChristianis a58 y.o.male,with history of diabetes mellitus type 2, CAD s/p CABG x3 vessel, GERD, hypertension, hyperlipidemia, depression was brought to hospital after patient had episode of syncope x2 at home. Patient says that he had decreased appetite, fever and body aches. He was exposed to his sister who has coronavirus. Today in the ED chest x-ray shows bilateral pneumonia, he was found to have positive COVID-19, SARS-CoV-2 RT-PCR. Patient also was found to have positive orthostasis, was given 500 cc normal saline bolus. His blood pressure continues to be soft. He denies nausea vomiting or diarrhea. Denies abdominal pain or dysuria Denies shortness of breath, but has been coughing up green-colored phlegm. Denies chest pain  3/10: Patient was admitted with acute hypoxemic respiratory failure secondary to COVID-19 pneumonia.  He appears to be doing better with improvements and inflammatory markers noted.  Continue current treatments and transfer out of ICU today.  No  dark or tarry stools otherwise noted.  Anticipate discharge in a.m.  3/11: Patient has had no further dark stools noted and hemoglobin has remained stable. Encouraged GI follow-up in the outpatient setting. He will have remdesivir infusions for 2 more days to finish total 5-day course and remain on Decadron as prescribed. He is currently on room air and is able to ambulate without any significant desaturations and states that his symptoms have significantly improved. No further overnight issues noted. He is overall stable for discharge. He will also continue on Augmentin for 2 more days to finish 5-day course of treatment for possible superimposed bacterial infection.  Discharge Diagnoses:  Principal Problem:   Sepsis ([place]) Active Problems:   COVID-19 virus infection  Principal discharge diagnosis: Acute hypoxemic respiratory failure secondary to Covid pneumonia with possible superimposed bacterial infection.  Discharge Instructions  Discharge Instructions    Diet - low sodium heart healthy   Complete by: As directed    Increase activity slowly   Complete by: As directed      Allergies as of 04/05/2019      Reactions   Bee Venom Anaphylaxis, Swelling   Questran [cholestyramine]       Medication List    STOP taking these medications   doxycycline 100 MG capsule Commonly known as: VIBRAMYCIN     TAKE these medications   albuterol 108 (90 Base) MCG/ACT inhaler Commonly known as: VENTOLIN HFA Inhale 2 puffs into the lungs 3 (three) times daily as needed for wheezing or shortness of breath. What changed: Another medication with the same name was added. Make sure you understand how and when to take each.   albuterol 108 (90 Base) MCG/ACT  inhaler Commonly known as: VENTOLIN HFA Inhale 2 puffs into the lungs every 6 (six) hours as needed for wheezing or shortness of breath. What changed: You were already taking a medication with the same name, and this prescription was added. Make  sure you understand how and when to take each.   amLODipine 10 MG tablet Commonly known as: NORVASC Take 5 mg by mouth daily.   amoxicillin-clavulanate 875-125 MG tablet Commonly known as: Augmentin Take 1 tablet by mouth 2 (two) times daily for 2 days.   aspirin EC 81 MG tablet Take 81 mg by mouth daily.   Calcium 600+D 600-400 MG-UNIT tablet Generic drug: Calcium Carbonate-Vitamin D Take 1 tablet by mouth 2 (two) times daily.   cetirizine 10 MG tablet Commonly known as: ZYRTEC Take 10 mg by mouth 2 (two) times daily.   dexamethasone 6 MG tablet Commonly known as: DECADRON Take 1 tablet (6 mg total) by mouth daily for 7 days.   finasteride 5 MG tablet Commonly known as: PROSCAR Take 5 mg by mouth daily.   fluticasone 50 MCG/ACT nasal spray Commonly known as: FLONASE Place 2 sprays into both nostrils daily.   furosemide 40 MG tablet Commonly known as: LASIX Take 40 mg by mouth daily.   gabapentin 300 MG capsule Commonly known as: NEURONTIN Take 900 mg by mouth 3 (three) times daily.   guaiFENesin-dextromethorphan 100-10 MG/5ML syrup Commonly known as: ROBITUSSIN DM Take 5 mLs by mouth every 4 (four) hours as needed for cough.   insulin aspart 100 UNIT/ML FlexPen Commonly known as: NOVOLOG Inject 10 Units into the skin 3 (three) times daily with meals.   insulin glargine 100 UNIT/ML injection Commonly known as: LANTUS Inject 44 Units into the skin at bedtime.   isosorbide mononitrate 30 MG 24 hr tablet Commonly known as: IMDUR Take 30 mg by mouth daily.   latanoprost 0.005 % ophthalmic solution Commonly known as: XALATAN Place 1 drop into both eyes at bedtime.   lisinopril 20 MG tablet Commonly known as: ZESTRIL Take 20 mg by mouth daily.   magnesium oxide 400 MG tablet Commonly known as: MAG-OX Take 400 mg by mouth daily.   Melatonin 5 MG Tabs Take 5 mg by mouth at bedtime.   mesalamine 1000 MG suppository Commonly known as: CANASA Place 1,000  mg rectally at bedtime.   metFORMIN 500 MG tablet Commonly known as: GLUCOPHAGE Take 1,000 mg by mouth 2 (two) times daily with a meal.   metoprolol succinate 25 MG 24 hr tablet Commonly known as: TOPROL-XL Take 25 mg by mouth daily.   mirabegron ER 25 MG Tb24 tablet Commonly known as: MYRBETRIQ Take 25 mg by mouth daily.   mirtazapine 7.5 MG tablet Commonly known as: REMERON Take 7.5 mg by mouth at bedtime.   naloxone 4 MG/0.1ML Liqd nasal spray kit Commonly known as: NARCAN Place 1 spray into the nose.   pantoprazole 40 MG tablet Commonly known as: PROTONIX Take 1 tablet (40 mg total) by mouth daily for 10 days.   potassium chloride SA 20 MEQ tablet Commonly known as: KLOR-CON Take 20 mEq by mouth daily.   rosuvastatin 20 MG tablet Commonly known as: CRESTOR Take 20 mg by mouth daily.   sennosides-docusate sodium 8.6-50 MG tablet Commonly known as: SENOKOT-S Take 2 tablets by mouth daily.   sucralfate 1 GM/10ML suspension Commonly known as: CARAFATE Place 1 g rectally 2 (two) times daily.   terazosin 2 MG capsule Commonly known as: HYTRIN Take 4 mg  by mouth at bedtime.   trospium 20 MG tablet Commonly known as: SANCTURA Take 20 mg by mouth 2 (two) times daily.   venlafaxine XR 75 MG 24 hr capsule Commonly known as: EFFEXOR-XR Take 225 mg by mouth daily with breakfast.      [facility] Follow up in 1 week(s).   Specialty: General Practice Contact information: [address] [place] [address] [phone]          Allergies  Allergen Reactions  . Bee Venom Anaphylaxis and Swelling  . Questran [Cholestyramine]     Consultations:  None   Procedures/Studies: DG Chest Port 1 View  Result Date: 04/02/2019 CLINICAL DATA:  Weakness and suspicion for COVID-19 positivity EXAM: PORTABLE CHEST 1 VIEW COMPARISON:  11/07/2018 FINDINGS: Cardiac shadow is stable. Postsurgical changes are noted. Patchy airspace  opacities are noted bilaterally consistent with atypical pneumonia, possible COVID-19. No sizable effusion is seen. No bony abnormality is noted. IMPRESSION: Bilateral airspace opacities consistent with atypical pneumonia, possible COVID-19. Electronically Signed   By: Inez Catalina M.D.   On: 04/02/2019 22:41   ECHOCARDIOGRAM COMPLETE  Result Date: 04/03/2019    ECHOCARDIOGRAM REPORT   Patient Name:   JAVELLE DONIGAN Date of Exam: 04/03/2019 Medical Rec #:  [id number]          Height:       75.0 in Accession #:    [id number]         Weight:       228.8 lb Date of Birth:  1960/04/25          BSA:          2.324 m Patient Age:    10 years           BP:           92/64 mmHg Patient Gender: M                  HR:           67 bpm. Exam Location:  [facility] Procedure: 2D Echo, Cardiac Doppler and Color Doppler Indications:    Congestive Heart Failure 428.0 / I50.9  History:        Patient has prior history of Echocardiogram examinations, most                 recent 10/11/2012. Previous Myocardial Infarction, Prior CABG;                 Risk Factors:Hypertension, Dyslipidemia and Diabetes. Pneumonia                 due to COVID-19 virus,CVA.  Sonographer:    Alvino Chapel RCS Referring Phys: [place]  1. Left ventricular ejection fraction, by estimation, is 55 to 60%. The left ventricle has normal function. The left ventricle has no regional wall motion abnormalities. There is mild left ventricular hypertrophy. Left ventricular diastolic parameters are indeterminate.  2. Right ventricular systolic function is normal. The right ventricular size is normal. Tricuspid regurgitation signal is inadequate for assessing PA pressure.  3. The mitral valve is grossly normal. Trivial mitral valve regurgitation.  4. The aortic valve is tricuspid. Aortic valve regurgitation is not visualized.  5. The inferior vena cava is normal in size with greater than 50% respiratory variability, suggesting right atrial  pressure of 3 mmHg. FINDINGS  Left Ventricle: Left ventricular ejection fraction, by estimation, is 55 to 60%. The  left ventricle has normal function. The left ventricle has no regional wall motion abnormalities. The left ventricular internal cavity size was normal in size. There is  mild left ventricular hypertrophy. Left ventricular diastolic parameters are indeterminate. Right Ventricle: The right ventricular size is normal. No increase in right ventricular wall thickness. Right ventricular systolic function is normal. Tricuspid regurgitation signal is inadequate for assessing PA pressure. Left Atrium: Left atrial size was normal in size. Right Atrium: Right atrial size was normal in size. Pericardium: There is no evidence of pericardial effusion. Mitral Valve: The mitral valve is grossly normal. Trivial mitral valve regurgitation. Tricuspid Valve: The tricuspid valve is grossly normal. Tricuspid valve regurgitation is trivial. Aortic Valve: The aortic valve is tricuspid. Aortic valve regurgitation is not visualized. Mild aortic valve annular calcification. Pulmonic Valve: The pulmonic valve was grossly normal. Pulmonic valve regurgitation is trivial. Aorta: The aortic root is normal in size and structure. Venous: The inferior vena cava is normal in size with greater than 50% respiratory variability, suggesting right atrial pressure of 3 mmHg. IAS/Shunts: No atrial level shunt detected by color flow Doppler.  LEFT VENTRICLE PLAX 2D LVIDd:         4.54 cm  Diastology LVIDs:         3.02 cm  LV e' lateral:   8.70 cm/s LV PW:         1.12 cm  LV E/e' lateral: 9.8 LV IVS:        1.35 cm  LV e' medial:    7.51 cm/s LVOT diam:     2.10 cm  LV E/e' medial:  11.3 LV SV:         74 LV SV Index:   32 LVOT Area:     3.46 cm  RIGHT VENTRICLE RV S prime:     8.70 cm/s TAPSE (M-mode): 1.6 cm LEFT ATRIUM             Index       RIGHT ATRIUM           Index LA diam:        4.30 cm 1.85 cm/m  RA Area:     17.70 cm LA Vol  (A2C):   63.9 ml 27.50 ml/m RA Volume:   48.90 ml  21.04 ml/m LA Vol (A4C):   64.4 ml 27.71 ml/m LA Biplane Vol: 66.1 ml 28.45 ml/m  AORTIC VALVE LVOT Vmax:   107.00 cm/s LVOT Vmean:  69.400 cm/s LVOT VTI:    0.214 m  AORTA Ao Root diam: 3.80 cm MITRAL VALVE MV Area (PHT): 3.42 cm    SHUNTS MV Decel Time: 222 msec    Systemic VTI:  0.21 m MV E velocity: 85.20 cm/s  Systemic Diam: 2.10 cm MV A velocity: 85.90 cm/s MV E/A ratio:  0.99 Rozann Lesches MD Electronically signed by Rozann Lesches MD Signature Date/Time: 04/03/2019/10:02:51 AM    Final     Discharge Exam: Vitals:   04/05/19 0434 04/05/19 0829  BP: (!) 146/88   Pulse: (!) 59   Resp: 16   Temp: (!) 97.5 F (36.4 C)   SpO2: (!) 89% 94%   Vitals:   04/04/19 1449 04/04/19 2118 04/05/19 0434 04/05/19 0829  BP: 129/82 138/86 (!) 146/88   Pulse: 64 65 (!) 59   Resp: '18 18 16   ' Temp: 97.9 F (36.6 C) (!) 97.5 F (36.4 C) (!) 97.5 F (36.4 C)   TempSrc: Oral Oral Oral   SpO2:  94% Marland Kitchen)  89% 94%  Weight:      Height:        General: Pt is alert, awake, not in acute distress Cardiovascular: RRR, S1/S2 +, no rubs, no gallops Respiratory: CTA bilaterally, no wheezing, no rhonchi Abdominal: Soft, NT, ND, bowel sounds + Extremities: no edema, no cyanosis    The results of significant diagnostics from this hospitalization (including imaging, microbiology, ancillary and laboratory) are listed below for reference.     Microbiology: Recent Results (from the past 240 hour(s))  Blood Culture (routine x 2)     Status: None (Preliminary result)   Collection Time: 04/02/19 11:04 PM   Specimen: BLOOD  Result Value Ref Range Status   Specimen Description BLOOD RIGHT ANTECUBITAL  Final   Special Requests   Final    BOTTLES DRAWN AEROBIC AND ANAEROBIC Blood Culture adequate volume   Culture   Final    NO GROWTH 3 DAYS Performed at [facility], [address] [place], [place] [address]    Report Status PENDING  Incomplete  Blood  Culture (routine x 2)     Status: None (Preliminary result)   Collection Time: 04/02/19 11:09 PM   Specimen: BLOOD  Result Value Ref Range Status   Specimen Description BLOOD LEFT ANTECUBITAL  Final   Special Requests   Final    BOTTLES DRAWN AEROBIC AND ANAEROBIC Blood Culture adequate volume   Culture   Final    NO GROWTH 3 DAYS Performed at [facility], [address], [place], [place] [address]    Report Status PENDING  Incomplete  MRSA PCR Screening     Status: None   Collection Time: 04/03/19  7:21 AM   Specimen: Nasal Mucosa; Nasopharyngeal  Result Value Ref Range Status   MRSA by PCR NEGATIVE NEGATIVE Final    Comment:        The GeneXpert MRSA Assay (FDA approved for NASAL specimens only), is one component of a comprehensive MRSA colonization surveillance program. It is not intended to diagnose MRSA infection nor to guide or monitor treatment for MRSA infections. Performed at [facility], [address], [place], [place] [address]      Labs: BNP (last 3 results) Recent Labs    04/02/19 2304  BNP 91.4   Basic Metabolic Panel: Recent Labs  Lab 04/02/19 2304 04/04/19 0741 04/05/19 0521  NA 135 140 140  K 4.0 5.0 3.8  CL 101 106 107  CO2 '22 27 25  ' GLUCOSE 91 165* 155*  BUN '14 20 18  ' CREATININE 1.65* 1.15 0.99  CALCIUM 8.3* 8.3* 7.9*   Liver Function Tests: Recent Labs  Lab 04/02/19 2304 04/04/19 0741 04/05/19 0521  AST 42* 32 31  ALT '27 24 27  ' ALKPHOS 34* 49 46  BILITOT 0.9 0.6 0.6  PROT 6.8 6.5 6.4*  ALBUMIN 3.6 3.1* 3.0*   No results for input(s): LIPASE, AMYLASE in the last 168 hours. No results for input(s): AMMONIA in the last 168 hours. CBC: Recent Labs  Lab 04/02/19 2304 04/04/19 0741 04/05/19 0521  WBC 8.6 8.0 9.1  HGB 13.2 13.6 13.4  HCT 40.5 41.9 40.8  MCV 87.3 86.0 85.4  PLT 109* 138* 160   Cardiac Enzymes: No results for input(s): CKTOTAL, CKMB, CKMBINDEX, TROPONINI in the last 168 hours. BNP: Invalid input(s):  POCBNP CBG: Recent Labs  Lab 04/04/19 0817 04/04/19 1147 04/04/19 1659 04/04/19 2114 04/05/19 0824  GLUCAP 171* 245* 195* 214* 152*   D-Dimer Recent Labs    04/04/19 0425  04/05/19 0521  DDIMER <0.27 0.30   Hgb A1c Recent Labs    04/02/19 2304  HGBA1C 5.4   Lipid Profile Recent Labs    04/02/19 2304  TRIG 123   Thyroid function studies No results for input(s): TSH, T4TOTAL, T3FREE, THYROIDAB in the last 72 hours.  Invalid input(s): FREET3 Anemia work up Recent Labs    04/04/19 0425 04/05/19 0521  FERRITIN 178 189   Urinalysis    Component Value Date/Time   COLORURINE AMBER (A) 04/03/2019 1714   APPEARANCEUR CLEAR 04/03/2019 1714   LABSPEC 1.029 04/03/2019 1714   PHURINE 5.0 04/03/2019 1714   [place] 04/03/2019 1714   HGBUR NEGATIVE 04/03/2019 1714   BILIRUBINUR NEGATIVE 04/03/2019 1714   KETONESUR NEGATIVE 04/03/2019 1714   PROTEINUR NEGATIVE 04/03/2019 1714   UROBILINOGEN 0.2 10/11/2012 0443   NITRITE NEGATIVE 04/03/2019 1714   LEUKOCYTESUR NEGATIVE 04/03/2019 1714   Sepsis Labs Invalid input(s): PROCALCITONIN,  WBC,  LACTICIDVEN Microbiology Recent Results (from the past 240 hour(s))  Blood Culture (routine x 2)     Status: None (Preliminary result)   Collection Time: 04/02/19 11:04 PM   Specimen: BLOOD  Result Value Ref Range Status   Specimen Description BLOOD RIGHT ANTECUBITAL  Final   Special Requests   Final    BOTTLES DRAWN AEROBIC AND ANAEROBIC Blood Culture adequate volume   Culture   Final    NO GROWTH 3 DAYS Performed at [facility], [address] [place], [place] [address]    Report Status PENDING  Incomplete  Blood Culture (routine x 2)     Status: None (Preliminary result)   Collection Time: 04/02/19 11:09 PM   Specimen: BLOOD  Result Value Ref Range Status   Specimen Description BLOOD LEFT ANTECUBITAL  Final   Special Requests   Final    BOTTLES DRAWN AEROBIC AND ANAEROBIC Blood Culture adequate volume    Culture   Final    NO GROWTH 3 DAYS Performed at [facility], [address] [place], [place] [address]    Report Status PENDING  Incomplete  MRSA PCR Screening     Status: None   Collection Time: 04/03/19  7:21 AM   Specimen: Nasal Mucosa; Nasopharyngeal  Result Value Ref Range Status   MRSA by PCR NEGATIVE NEGATIVE Final    Comment:        The GeneXpert MRSA Assay (FDA approved for NASAL specimens only), is one component of a comprehensive MRSA colonization surveillance program. It is not intended to diagnose MRSA infection nor to guide or monitor treatment for MRSA infections. Performed at [facility], [address] [place], [place] [address]      Time coordinating discharge: 35 minutes  SIGNED:   Rodena Goldmann, DO [facility] 04/05/2019, 10:14 AM  If 7PM-7AM, please contact night-coverage [email or web address]

## 2019-04-05 NOTE — Progress Notes (Signed)
Patient scheduled for outpatient Remdesivir infusion at 10:00 AM on Friday 3/12 and Saturday 3/13.  Please advise them to report to [facility] at [address].  Drive to the security guard and tell them you are here for an infusion. They will direct you to the front entrance where we will come and get you.  For questions call [phone].  Thanks

## 2019-04-05 NOTE — Discharge Instructions (Addendum)
You are scheduled for an outpatient infusion of Remdesivir at 10:00 AM on Friday 3/12 and Saturday 3/13.  Please report to [facility] at [address].  Drive to the security guard and tell them you are here for an infusion. They will direct you to the front entrance where we will come and get you.  For questions call [phone].  Thanks

## 2019-04-05 NOTE — Progress Notes (Signed)
Discharge instructions provided to patient. Reviewed new medications, where to pick up from pharmacy. Also reviewed follow-up infusion appointments for 3/11 and 3/12. Patient verbalized understanding.

## 2019-04-06 ENCOUNTER — Ambulatory Visit (HOSPITAL_COMMUNITY)
Admission: RE | Admit: 2019-04-06 | Discharge: 2019-04-06 | Disposition: A | Discharge status: Home / Self Care | Payer: Medicaid Other | Source: Ambulatory Visit | Attending: Pulmonary Disease | Admitting: Pulmonary Disease

## 2019-04-06 DIAGNOSIS — Z20822 Contact with and (suspected) exposure to covid-19: Secondary | ICD-10-CM | POA: Diagnosis present

## 2019-04-06 MED ORDER — DIPHENHYDRAMINE HCL 50 MG/ML IJ SOLN: 50.0000 mg | Freq: Once | INTRAMUSCULAR | Status: DC | PRN

## 2019-04-06 MED ORDER — SODIUM CHLORIDE 0.9 % IV SOLN: INTRAVENOUS | Status: DC | PRN

## 2019-04-06 MED ORDER — EPINEPHRINE 0.3 MG/0.3ML IJ SOAJ: 0.3000 mg | Freq: Once | INTRAMUSCULAR | Status: DC | PRN

## 2019-04-06 MED ORDER — SODIUM CHLORIDE 0.9 % IV SOLN
100.0000 mg | Freq: Once | INTRAVENOUS | Status: AC
  Administered 2019-04-06: 100 mg via INTRAVENOUS
  Filled 2019-04-06: qty 20

## 2019-04-06 MED ORDER — METHYLPREDNISOLONE SODIUM SUCC 125 MG IJ SOLR: 125.0000 mg | Freq: Once | INTRAMUSCULAR | Status: DC | PRN

## 2019-04-06 MED ORDER — FAMOTIDINE IN NACL 20-0.9 MG/50ML-% IV SOLN: 20.0000 mg | Freq: Once | INTRAVENOUS | Status: DC | PRN

## 2019-04-06 MED ORDER — ALBUTEROL SULFATE HFA 108 (90 BASE) MCG/ACT IN AERS: 2.0000 | INHALATION_SPRAY | Freq: Once | RESPIRATORY_TRACT | Status: DC | PRN

## 2019-04-06 NOTE — Progress Notes (Signed)
  Diagnosis: COVID-19  Physician: Dr. Joya Gaskins  Procedure: [facility] Med: remdesivir infusion.  Complications: No immediate complications noted.  Discharge: Discharged home   Acquanetta Chain 04/06/2019

## 2019-04-06 NOTE — Discharge Instructions (Signed)

## 2019-04-07 ENCOUNTER — Ambulatory Visit (HOSPITAL_COMMUNITY)
Admit: 2019-04-07 | Discharge: 2019-04-07 | Disposition: A | Discharge status: Home / Self Care | Payer: Medicaid Other | Attending: Pulmonary Disease | Admitting: Pulmonary Disease

## 2019-04-07 DIAGNOSIS — Z20822 Contact with and (suspected) exposure to covid-19: Secondary | ICD-10-CM | POA: Diagnosis not present

## 2019-04-07 LAB — CULTURE, BLOOD (ROUTINE X 2)
Culture: NO GROWTH
Culture: NO GROWTH
Special Requests: ADEQUATE
Special Requests: ADEQUATE

## 2019-04-07 MED ORDER — EPINEPHRINE 0.3 MG/0.3ML IJ SOAJ: 0.3000 mg | Freq: Once | INTRAMUSCULAR | Status: DC | PRN

## 2019-04-07 MED ORDER — METHYLPREDNISOLONE SODIUM SUCC 125 MG IJ SOLR: 125.0000 mg | Freq: Once | INTRAMUSCULAR | Status: DC | PRN

## 2019-04-07 MED ORDER — DIPHENHYDRAMINE HCL 50 MG/ML IJ SOLN: 50.0000 mg | Freq: Once | INTRAMUSCULAR | Status: DC | PRN

## 2019-04-07 MED ORDER — ALBUTEROL SULFATE HFA 108 (90 BASE) MCG/ACT IN AERS: 2.0000 | INHALATION_SPRAY | Freq: Once | RESPIRATORY_TRACT | Status: DC | PRN

## 2019-04-07 MED ORDER — SODIUM CHLORIDE 0.9 % IV SOLN: INTRAVENOUS | Status: DC | PRN

## 2019-04-07 MED ORDER — SODIUM CHLORIDE 0.9 % IV SOLN
100.0000 mg | Freq: Once | INTRAVENOUS | Status: AC
  Administered 2019-04-07: 11:00:00 100 mg via INTRAVENOUS
  Filled 2019-04-07: qty 20

## 2019-04-07 MED ORDER — FAMOTIDINE IN NACL 20-0.9 MG/50ML-% IV SOLN: 20.0000 mg | Freq: Once | INTRAVENOUS | Status: DC | PRN

## 2019-04-07 NOTE — Progress Notes (Signed)
  Diagnosis: COVID-19  Physician: Dr. Joya Gaskins  Procedure: [facility] Infusion Clinic Med: remdesivir infusion.  Complications: No immediate complications noted.  Discharge: Discharged home   Acquanetta Chain 04/07/2019

## 2019-04-07 NOTE — Discharge Instructions (Signed)

## 2019-09-03 ENCOUNTER — Other Ambulatory Visit: Payer: Self-pay

## 2019-09-03 ENCOUNTER — Encounter: Payer: Self-pay | Admitting: Emergency Medicine

## 2019-09-03 ENCOUNTER — Emergency Department
Admission: EM | Admit: 2019-09-03 | Discharge: 2019-09-04 | Disposition: A | Discharge status: Home / Self Care | Payer: No Typology Code available for payment source | Attending: Emergency Medicine | Admitting: Emergency Medicine

## 2019-09-03 DIAGNOSIS — Z87891 Personal history of nicotine dependence: Secondary | ICD-10-CM | POA: Insufficient documentation

## 2019-09-03 DIAGNOSIS — Z8546 Personal history of malignant neoplasm of prostate: Secondary | ICD-10-CM | POA: Insufficient documentation

## 2019-09-03 DIAGNOSIS — Z794 Long term (current) use of insulin: Secondary | ICD-10-CM | POA: Insufficient documentation

## 2019-09-03 DIAGNOSIS — Z7951 Long term (current) use of inhaled steroids: Secondary | ICD-10-CM | POA: Diagnosis not present

## 2019-09-03 DIAGNOSIS — R55 Syncope and collapse: Secondary | ICD-10-CM | POA: Insufficient documentation

## 2019-09-03 DIAGNOSIS — E119 Type 2 diabetes mellitus without complications: Secondary | ICD-10-CM | POA: Insufficient documentation

## 2019-09-03 DIAGNOSIS — J45909 Unspecified asthma, uncomplicated: Secondary | ICD-10-CM | POA: Insufficient documentation

## 2019-09-03 DIAGNOSIS — I2510 Atherosclerotic heart disease of native coronary artery without angina pectoris: Secondary | ICD-10-CM | POA: Insufficient documentation

## 2019-09-03 LAB — CBC
HCT: 42.1 % (ref 39.0–52.0)
Hemoglobin: 14 g/dL (ref 13.0–17.0)
MCH: 28.3 pg (ref 26.0–34.0)
MCHC: 33.3 g/dL (ref 30.0–36.0)
MCV: 85.1 fL (ref 80.0–100.0)
Platelets: 216 10*3/uL (ref 150–400)
RBC: 4.95 MIL/uL (ref 4.22–5.81)
RDW: 13.9 % (ref 11.5–15.5)
WBC: 10 10*3/uL (ref 4.0–10.5)
nRBC: 0 % (ref 0.0–0.2)

## 2019-09-03 LAB — BASIC METABOLIC PANEL
Anion gap: 9 (ref 5–15)
BUN: 17 mg/dL (ref 6–20)
CO2: 25 mmol/L (ref 22–32)
Calcium: 9.3 mg/dL (ref 8.9–10.3)
Chloride: 107 mmol/L (ref 98–111)
Creatinine, Ser: 1.58 mg/dL — ABNORMAL HIGH (ref 0.61–1.24)
GFR calc Af Amer: 54 mL/min — ABNORMAL LOW (ref >60)
GFR calc non Af Amer: 47 mL/min — ABNORMAL LOW (ref >60)
Glucose, Bld: 106 mg/dL — ABNORMAL HIGH (ref 70–99)
Potassium: 4.3 mmol/L (ref 3.5–5.1)
Sodium: 141 mmol/L (ref 135–145)

## 2019-09-03 MED ORDER — SODIUM CHLORIDE 0.9% FLUSH
3.0000 mL | Freq: Once | INTRAVENOUS | Status: AC
  Administered 2019-09-04: 3 mL via INTRAVENOUS

## 2019-09-03 NOTE — ED Triage Notes (Signed)
Pt via pov from home with syncopal episode last night; feels "lightheaded" today. Pt denies hitting his head or loc. Pt states this has happened before and that he was on medication for the same, but recently was taken off the medication. He also reports that he has the episodes upon standing; he is diabetic as well. Pt alert & oriented, nad noted.

## 2019-09-04 ENCOUNTER — Emergency Department: Payer: No Typology Code available for payment source

## 2019-09-04 LAB — AMMONIA: Ammonia: 24 umol/L (ref 9–35)

## 2019-09-04 LAB — TROPONIN I (HIGH SENSITIVITY)
Troponin I (High Sensitivity): 6 ng/L (ref <18)
Troponin I (High Sensitivity): 5 ng/L (ref <18)

## 2019-09-04 MED ORDER — SODIUM CHLORIDE 0.9 % IV BOLUS
1000.0000 mL | Freq: Once | INTRAVENOUS | Status: AC
  Administered 2019-09-04: 1000 mL via INTRAVENOUS

## 2019-09-04 NOTE — ED Provider Notes (Signed)
[facility] Emergency Department Provider Note  ____________________________________________   First MD Initiated Contact with Patient 09/04/19 0026     (approximate)  I have reviewed the triage vital signs and the nursing notes.   HISTORY  Chief Complaint Loss of Consciousness    HPI [name] is a 59 y.o. male with below list of previous medical conditions including otitis ED with hepatic cirrhosis myocardial infarction diabetes mellitus hypertension hyperlipidemia presents to the emergency department secondary to near syncopal episode which patient states occurred last night.  Patient states that he arose from bed to go to his kitchen to get a cup of water when he suddenly felt as though he was going to pass out.  Patient states that he did not lose consciousness.  Patient denies any preceding symptoms no chest pain shortness of breath dizziness headache nausea or vomiting.  Patient denies any complaints at present.  Patient states that he recently received a medication that he took a long time ago for dizziness in the mail and began taking it he states that when he saw his primary care provider he discontinued it which was 1 week ago         Past Medical History:  Diagnosis Date  . Asthma   . Cancer [facility])    prostate  . Chronic back pain   . Chronic neck pain   . Cirrhosis of liver ([place])   . Complicated migraine 1/95/0932  . Coronary artery disease   . Depression   . Diabetes mellitus without complication ([place])   . Gallstones   . Headache(784.0)   . Hepatitis C   . Hypercholesteremia   . Hypertension   . Left leg paresthesias   . Myocardial infarction ([place])   . Stroke [facility])    TIA  . Vertigo     Patient Active Problem List   Diagnosis Date Noted  . COVID-19 virus infection 04/03/2019  . Sepsis ([place])   . Unstable angina ([place]) 04/28/2017  . Encounter for screening for upper gastrointestinal disorder   . Columnar  epithelial-lined lower esophagus   . Secondary esophageal varices without bleeding ([place])   . Encounter for screening colonoscopy   . Rectum inflammation   . Hematochezia   . Syncope, near 01/24/2017  . Hypotension 10/11/2012  . CAD (coronary artery disease) 10/11/2012  . S/P CABG (coronary artery bypass graft) 10/11/2012  . Cervical spondylosis 09/22/2011  . Neck pain 09/22/2011  . Syncope 06/20/2011  . ARF (acute renal failure) ([place]) 06/20/2011  . Complicated migraine 67/12/4578  . Hypertension   . Hepatitis C   . Cirrhosis of liver ([place])   . Depression   . Chronic back pain   . Left leg paresthesias   . Hypercholesteremia   . Chronic neck pain   . DXIPJASN(053.9)     Past Surgical History:  Procedure Laterality Date  . BYPASS GRAFT     triple bypass surgery  . CARDIAC SURGERY    . COLONOSCOPY WITH PROPOFOL N/A 02/15/2017   Procedure: COLONOSCOPY WITH PROPOFOL;  Surgeon: Virgel Manifold, MD;  Location: [place];  Service: Endoscopy;  Laterality: N/A;  . CORONARY ARTERY BYPASS GRAFT    . ESOPHAGOGASTRODUODENOSCOPY (EGD) WITH PROPOFOL N/A 02/15/2017   Procedure: ESOPHAGOGASTRODUODENOSCOPY (EGD) WITH PROPOFOL;  Surgeon: Virgel Manifold, MD;  Location: [place];  Service: Endoscopy;  Laterality: N/A;  . HAND SURGERY  1984  . LEFT HEART CATH AND CORONARY ANGIOGRAPHY Left 04/29/2017   Procedure:  LEFT HEART CATH AND CORONARY ANGIOGRAPHY;  Surgeon: Dionisio David, MD;  Location: [place] CV [facility];  Service: Cardiovascular;  Laterality: Left;  . LEFT HEART CATH AND CORONARY ANGIOGRAPHY Left 01/08/2019   Procedure: LEFT HEART CATH AND CORONARY ANGIOGRAPHY;  Surgeon: Dionisio David, MD;  Location: [place] [facility];  Service: Cardiovascular;  Laterality: Left;  . ORTHOPEDIC SURGERY     "on my legs"    Prior to Admission medications   Medication Sig Start Date End Date Taking? Authorizing Provider  albuterol (PROVENTIL HFA;VENTOLIN HFA) 108 (90  Base) MCG/ACT inhaler Inhale 2 puffs into the lungs 3 (three) times daily as needed for wheezing or shortness of breath.    [provider]  albuterol (VENTOLIN HFA) 108 (90 Base) MCG/ACT inhaler Inhale 2 puffs into the lungs every 6 (six) hours as needed for wheezing or shortness of breath. 04/05/19   Manuella Ghazi, Pratik D, DO  amLODipine (NORVASC) 10 MG tablet Take 5 mg by mouth daily.    [provider]  aspirin EC 81 MG tablet Take 81 mg by mouth daily.    [provider]  Calcium Carbonate-Vitamin D (CALCIUM 600+D) 600-400 MG-UNIT tablet Take 1 tablet by mouth 2 (two) times daily.     [provider]  cetirizine (ZYRTEC) 10 MG tablet Take 10 mg by mouth 2 (two) times daily.     [provider]  finasteride (PROSCAR) 5 MG tablet Take 5 mg by mouth daily.    [provider]  fluticasone (FLONASE) 50 MCG/ACT nasal spray Place 2 sprays into both nostrils daily.     [provider]  furosemide (LASIX) 40 MG tablet Take 40 mg by mouth daily.    [provider]  gabapentin (NEURONTIN) 300 MG capsule Take 900 mg by mouth 3 (three) times daily.     [provider]  guaiFENesin-dextromethorphan (ROBITUSSIN DM) 100-10 MG/5ML syrup Take 5 mLs by mouth every 4 (four) hours as needed for cough. 04/05/19   Manuella Ghazi, Pratik D, DO  insulin aspart (NOVOLOG) 100 UNIT/ML FlexPen Inject 10 Units into the skin 3 (three) times daily with meals.     [provider]  insulin glargine (LANTUS) 100 UNIT/ML injection Inject 44 Units into the skin at bedtime.     [provider]  isosorbide mononitrate (IMDUR) 30 MG 24 hr tablet Take 30 mg by mouth daily. 12/20/18   [provider]  latanoprost (XALATAN) 0.005 % ophthalmic solution Place 1 drop into both eyes at bedtime.    [provider]  lisinopril (ZESTRIL) 20 MG tablet Take 20 mg by mouth daily.    [provider]  magnesium oxide (MAG-OX) 400 MG tablet Take  400 mg by mouth daily.    [provider]  Melatonin 5 MG TABS Take 5 mg by mouth at bedtime.    [provider]  mesalamine (CANASA) 1000 MG suppository Place 1,000 mg rectally at bedtime.    [provider]  metFORMIN (GLUCOPHAGE) 500 MG tablet Take 1,000 mg by mouth 2 (two) times daily with a meal.     [provider]  metoprolol succinate (TOPROL-XL) 25 MG 24 hr tablet Take 25 mg by mouth daily.    [provider]  mirabegron ER (MYRBETRIQ) 25 MG TB24 tablet Take 25 mg by mouth daily.    [provider]  mirtazapine (REMERON) 7.5 MG tablet Take 7.5 mg by mouth at bedtime.    [provider]  naloxone Karma Greaser)  nasal spray 4 mg/0.1 mL Place 1 spray into the nose.    [provider]  pantoprazole (PROTONIX) 40 MG tablet Take 1 tablet (40 mg total) by mouth daily for 10 days. 04/05/19 04/15/19  Manuella Ghazi, Pratik D, DO  potassium chloride SA (K-DUR,KLOR-CON) 20 MEQ tablet Take 20 mEq by mouth daily.    [provider]  rosuvastatin (CRESTOR) 20 MG tablet Take 20 mg by mouth daily.    [provider]  sennosides-docusate sodium (SENOKOT-S) 8.6-50 MG tablet Take 2 tablets by mouth daily.    [provider]  sucralfate (CARAFATE) 1 GM/10ML suspension Place 1 g rectally 2 (two) times daily.    [provider]  terazosin (HYTRIN) 2 MG capsule Take 4 mg by mouth at bedtime.    [provider]  trospium (SANCTURA) 20 MG tablet Take 20 mg by mouth 2 (two) times daily.    [provider]  venlafaxine XR (EFFEXOR-XR) 75 MG 24 hr capsule Take 225 mg by mouth daily with breakfast.    [provider]    Allergies Bee venom and Questran [cholestyramine]  Family History  Problem Relation Age of Onset  . Diabetes Brother   . Diabetes Sister   . Diabetes Father   . Hypertension Father   . Diabetes Mother   . Hypertension Mother   . Hypertension Sister   . Hypertension Brother      Social History Social History   Tobacco Use  . Smoking status: Former Smoker    Packs/day: 1.00    Years: 35.00    Pack years: 35.00    Quit date: 01/25/2009    Years since quitting: 10.6  . Smokeless tobacco: Never Used  Vaping Use  . Vaping Use: Never used  Substance Use Topics  . Alcohol use: No    Comment: former alcoholic  . Drug use: No    Comment: former cocaine abuse    Review of Systems Constitutional: No fever/chills Eyes: No visual changes. ENT: No sore throat. Cardiovascular: Denies chest pain. Respiratory: Denies shortness of breath. Gastrointestinal: No abdominal pain.  No nausea, no vomiting.  No diarrhea.  No constipation. Genitourinary: Negative for dysuria. Musculoskeletal: Negative for neck pain.  Negative for back pain. Integumentary: Negative for rash. Neurological: Negative for headaches, focal weakness or numbness.   ____________________________________________   PHYSICAL EXAM:  VITAL SIGNS: ED Triage Vitals  Enc Vitals Group     BP 09/03/19 1531 104/73     Pulse Rate 09/03/19 1531 69     Resp 09/03/19 1531 20     Temp 09/03/19 1531 98 F (36.7 C)     Temp Source 09/03/19 1531 Oral     SpO2 09/03/19 1531 96 %     Weight 09/03/19 1532 99.3 kg (219 lb)     Height 09/03/19 1532 1.905 m (6\' 3" )     Head Circumference --      Peak Flow --      Pain Score 09/03/19 1532 7     Pain Loc --      Pain Edu? --      Excl. in [place]? --     Constitutional: Alert and oriented.  Eyes: Conjunctivae are normal.  Head: Atraumatic. Mouth/Throat: Patient is wearing a mask. Neck: No stridor.  No meningeal signs.   Cardiovascular: Normal rate, regular rhythm. Good peripheral circulation. Grossly normal heart sounds. Respiratory: Normal respiratory effort.  No retractions. Gastrointestinal: Soft and nontender. No distention.  Musculoskeletal: No lower extremity tenderness  nor edema. No gross deformities of extremities. Neurologic:  Normal speech and  language. No gross focal neurologic deficits are appreciated.  Skin:  Skin is warm, dry and intact. Psychiatric: Mood and affect are normal. Speech and behavior are normal.  ____________________________________________   LABS (all labs ordered are listed, but only abnormal results are displayed)  Labs Reviewed  BASIC METABOLIC PANEL - Abnormal; Notable for the following components:      Result Value   Glucose, Bld 106 (*)    Creatinine, Ser 1.58 (*)    GFR calc non Af Amer 47 (*)    GFR calc Af Amer 54 (*)    All other components within normal limits  CBC  AMMONIA  URINALYSIS, COMPLETE (UACMP) WITH MICROSCOPIC  CBG MONITORING, ED  TROPONIN I (HIGH SENSITIVITY)  TROPONIN I (HIGH SENSITIVITY)   ____________________________________________  EKG  ED ECG REPORT I, [place] N Slayter Moorhouse, the attending physician, personally viewed and interpreted this ECG.   Date: 09/04/2019  EKG Time: 3:28 PM  Rate: 73  Rhythm: Normal sinus rhythm Rate right bundle branch block  Axis: Normal  Intervals: Normal  ST&T Change: None  ____________________________________________  RADIOLOGY I, [place] N Karas Pickerill, personally viewed and evaluated these images (plain radiographs) as part of my medical decision making, as well as reviewing the written report by the radiologist.  ED MD interpretation: No acute intracranial abnormality noted on CT head per radiologist.  Official radiology report(s): CT Head Wo Contrast  Result Date: 09/04/2019 CLINICAL DATA:  Dizziness EXAM: CT HEAD WITHOUT CONTRAST TECHNIQUE: Contiguous axial images were obtained from the base of the skull through the vertex without intravenous contrast. COMPARISON:  January 24, 2017 FINDINGS: Brain: No evidence of acute territorial infarction, hemorrhage, hydrocephalus,extra-axial collection or mass lesion/mass effect. Normal gray-white differentiation. Ventricles are normal in size and contour. Vascular: No hyperdense vessel or unexpected  calcification. Skull: The skull is intact. No fracture or focal lesion identified. Sinuses/Orbits: The visualized paranasal sinuses and mastoid air cells are clear. The orbits and globes intact. Other: None IMPRESSION: No acute intracranial abnormality. Electronically Signed   By: Prudencio Pair M.D.   On: 09/04/2019 01:07     Procedures   ____________________________________________   INITIAL IMPRESSION / MDM / ASSESSMENT AND PLAN / ED COURSE  As part of my medical decision making, I reviewed the following data within the electronic MEDICAL RECORD NUMBER   59 year old male presented with above-stated history and physical exam secondary to near syncope.  Laboratory data revealed no cute abnormality including high-sensitivity troponins x2.  CT head revealed no acute intracranial abnormality.  Patient without any complaints at present.  Unclear etiology for the patient's near syncopal episode identified in such patient will be referred to primary care provider for further outpatient evaluation.     ____________________________________________  FINAL CLINICAL IMPRESSION(S) / ED DIAGNOSES  Final diagnoses:  Near syncope     MEDICATIONS GIVEN DURING THIS VISIT:  Medications  sodium chloride flush (NS) 0.9 % injection 3 mL (3 mLs Intravenous Given 09/04/19 0018)  sodium chloride 0.9 % bolus 1,000 mL (0 mLs Intravenous Stopped 09/04/19 0244)     ED Discharge Orders    None      *Please note:  [name] was evaluated in Emergency Department on 09/04/2019 for the symptoms described in the history of present illness. He was evaluated in the context of the global COVID-19 pandemic, which necessitated consideration that the patient might be at risk for infection with the SARS-CoV-2 virus that  causes COVID-19. Institutional protocols and algorithms that pertain to the evaluation of patients at risk for COVID-19 are in a state of rapid change based on information released by regulatory  bodies including the CDC and federal and state organizations. These policies and algorithms were followed during the patient's care in the ED.  Some ED evaluations and interventions may be delayed as a result of limited staffing during and after the pandemic.*  Note:  This document was prepared using Dragon voice recognition software and may include unintentional dictation errors.   Gregor Hams, MD 09/04/19 [phone]

## 2019-09-04 NOTE — ED Notes (Signed)
Pt states coming in due to having an episode when he collapsed last night. Pt states he has been having dizziness started 3 weeks ago after being placed on a new medication. Pt states his cardiologist adjust his medications again several days ago. Pt is now in bed on cardiac, bp and pulse ox monitor. Pt states at times his legs are numb, but denies that right now and states full sensation.

## 2019-09-08 ENCOUNTER — Emergency Department (HOSPITAL_COMMUNITY)
Admission: EM | Admit: 2019-09-08 | Discharge: 2019-09-08 | Disposition: A | Discharge status: Home / Self Care | Payer: No Typology Code available for payment source | Attending: Emergency Medicine | Admitting: Emergency Medicine

## 2019-09-08 ENCOUNTER — Encounter (HOSPITAL_COMMUNITY): Payer: Self-pay | Admitting: Emergency Medicine

## 2019-09-08 ENCOUNTER — Other Ambulatory Visit: Payer: Self-pay

## 2019-09-08 DIAGNOSIS — I10 Essential (primary) hypertension: Secondary | ICD-10-CM | POA: Diagnosis not present

## 2019-09-08 DIAGNOSIS — Z7982 Long term (current) use of aspirin: Secondary | ICD-10-CM | POA: Insufficient documentation

## 2019-09-08 DIAGNOSIS — Z79899 Other long term (current) drug therapy: Secondary | ICD-10-CM | POA: Insufficient documentation

## 2019-09-08 DIAGNOSIS — C61 Malignant neoplasm of prostate: Secondary | ICD-10-CM | POA: Diagnosis not present

## 2019-09-08 DIAGNOSIS — J45909 Unspecified asthma, uncomplicated: Secondary | ICD-10-CM | POA: Diagnosis not present

## 2019-09-08 DIAGNOSIS — R55 Syncope and collapse: Secondary | ICD-10-CM | POA: Diagnosis not present

## 2019-09-08 DIAGNOSIS — E119 Type 2 diabetes mellitus without complications: Secondary | ICD-10-CM | POA: Diagnosis not present

## 2019-09-08 DIAGNOSIS — Z951 Presence of aortocoronary bypass graft: Secondary | ICD-10-CM | POA: Diagnosis not present

## 2019-09-08 DIAGNOSIS — Z87891 Personal history of nicotine dependence: Secondary | ICD-10-CM | POA: Insufficient documentation

## 2019-09-08 DIAGNOSIS — R270 Ataxia, unspecified: Secondary | ICD-10-CM | POA: Insufficient documentation

## 2019-09-08 DIAGNOSIS — R531 Weakness: Secondary | ICD-10-CM | POA: Diagnosis present

## 2019-09-08 DIAGNOSIS — Z7984 Long term (current) use of oral hypoglycemic drugs: Secondary | ICD-10-CM | POA: Diagnosis not present

## 2019-09-08 DIAGNOSIS — I25110 Atherosclerotic heart disease of native coronary artery with unstable angina pectoris: Secondary | ICD-10-CM | POA: Insufficient documentation

## 2019-09-08 LAB — CBC
HCT: 40.1 % (ref 39.0–52.0)
Hemoglobin: 13.4 g/dL (ref 13.0–17.0)
MCH: 29 pg (ref 26.0–34.0)
MCHC: 33.4 g/dL (ref 30.0–36.0)
MCV: 86.8 fL (ref 80.0–100.0)
Platelets: 195 10*3/uL (ref 150–400)
RBC: 4.62 MIL/uL (ref 4.22–5.81)
RDW: 14 % (ref 11.5–15.5)
WBC: 9.2 10*3/uL (ref 4.0–10.5)
nRBC: 0 % (ref 0.0–0.2)

## 2019-09-08 LAB — BASIC METABOLIC PANEL
Anion gap: 7 (ref 5–15)
BUN: 15 mg/dL (ref 6–20)
CO2: 26 mmol/L (ref 22–32)
Calcium: 8.7 mg/dL — ABNORMAL LOW (ref 8.9–10.3)
Chloride: 106 mmol/L (ref 98–111)
Creatinine, Ser: 1.39 mg/dL — ABNORMAL HIGH (ref 0.61–1.24)
GFR calc Af Amer: >60 mL/min (ref >60)
GFR calc non Af Amer: 55 mL/min — ABNORMAL LOW (ref >60)
Glucose, Bld: 90 mg/dL (ref 70–99)
Potassium: 3.7 mmol/L (ref 3.5–5.1)
Sodium: 139 mmol/L (ref 135–145)

## 2019-09-08 LAB — CBG MONITORING, ED: Glucose-Capillary: 80 mg/dL (ref 70–99)

## 2019-09-08 NOTE — Discharge Instructions (Signed)
  All the results in the ER are normal, labs and imaging. We are not sure what is causing your symptoms. The workup in the ER is not complete, and is limited to screening for life threatening and emergent conditions only, so please see a primary care doctor for further evaluation.  Return to the ER if your symptoms get worse.

## 2019-09-08 NOTE — ED Notes (Signed)
During Orthostatic V/S Assessment, Pt reported leg weakness when transitioning to Standing Position. When Pt was returning to seated Position on bed, Pt became unsteady and needed help being assisted back to bed by RN.

## 2019-09-08 NOTE — ED Provider Notes (Signed)
[facility] EMERGENCY DEPARTMENT Provider Note   CSN: [id number] Arrival date & time: 09/08/19  1749     History Chief Complaint  Patient presents with  . Weakness    [name] is a 59 y.o. male.  HPI   59 year old male comes in a chief complaint of weakness. Patient has history of liver cirrhosis, CAD, diabetes, vertigo and TIA.  Patient reports that over the last month he has had intermittent episodes of balance issues and near fainting spells.  Patient has no symptoms when he is resting.  Typically he starts noticing some dizziness when he gets up or when he is getting ready to do something.  Though symptoms last for few minutes and then eventually resolved.  The dizziness is described as lightheadedness.  Patient has had few episodes of syncope with the dizziness.  He denies any associated chest pain, palpitations, shortness of breath.  Patient has not had any syncopal episode while he has been laying down.  Patient denies any associated vision change, focal /unilateral numbness or tingling or weakness, slurred speech.  He states that he has had TIA in the past but does not remember what symptoms were present.  Patient thinks that his symptoms were because of medication changes.  He was seen in the ER few days back and had CT scan of his brain along with basic lab work-up and cardiac enzymes, all 4 -.  He comes back to the ER today because he had a repeat episode where he got up, and started feeling shaky/unsteady.    Past Medical History:  Diagnosis Date  . Asthma   . Cancer [facility])    prostate  . Chronic back pain   . Chronic neck pain   . Cirrhosis of liver ([place])   . Complicated migraine 01/31/2692  . Coronary artery disease   . Depression   . Diabetes mellitus without complication ([place])   . Gallstones   . Headache(784.0)   . Hepatitis C   . Hypercholesteremia   . Hypertension   . Left leg paresthesias   . Myocardial infarction ([place])   . Stroke [facility])    TIA   . Vertigo     Patient Active Problem List   Diagnosis Date Noted  . COVID-19 virus infection 04/03/2019  . Sepsis ([place])   . Unstable angina ([place]) 04/28/2017  . Encounter for screening for upper gastrointestinal disorder   . Columnar epithelial-lined lower esophagus   . Secondary esophageal varices without bleeding ([place])   . Encounter for screening colonoscopy   . Rectum inflammation   . Hematochezia   . Syncope, near 01/24/2017  . Hypotension 10/11/2012  . CAD (coronary artery disease) 10/11/2012  . S/P CABG (coronary artery bypass graft) 10/11/2012  . Cervical spondylosis 09/22/2011  . Neck pain 09/22/2011  . Syncope 06/20/2011  . ARF (acute renal failure) ([place]) 06/20/2011  . Complicated migraine 85/46/2703  . Hypertension   . Hepatitis C   . Cirrhosis of liver ([place])   . Depression   . Chronic back pain   . Left leg paresthesias   . Hypercholesteremia   . Chronic neck pain   . [id number](299.3)     Past Surgical History:  Procedure Laterality Date  . BYPASS GRAFT     triple bypass surgery  . CARDIAC SURGERY    . COLONOSCOPY WITH PROPOFOL N/A 02/15/2017   Procedure: COLONOSCOPY WITH PROPOFOL;  Surgeon: Virgel Manifold, MD;  Location: [place];  Service: Endoscopy;  Laterality: N/A;  .  CORONARY ARTERY BYPASS GRAFT    . ESOPHAGOGASTRODUODENOSCOPY (EGD) WITH PROPOFOL N/A 02/15/2017   Procedure: ESOPHAGOGASTRODUODENOSCOPY (EGD) WITH PROPOFOL;  Surgeon: Virgel Manifold, MD;  Location: [place];  Service: Endoscopy;  Laterality: N/A;  . HAND SURGERY  1984  . LEFT HEART CATH AND CORONARY ANGIOGRAPHY Left 04/29/2017   Procedure: LEFT HEART CATH AND CORONARY ANGIOGRAPHY;  Surgeon: Dionisio David, MD;  Location: [place] [facility];  Service: Cardiovascular;  Laterality: Left;  . LEFT HEART CATH AND CORONARY ANGIOGRAPHY Left 01/08/2019   Procedure: LEFT HEART CATH AND CORONARY ANGIOGRAPHY;  Surgeon: Dionisio David, MD;  Location: [place] [facility]  [facility];  Service: Cardiovascular;  Laterality: Left;  . ORTHOPEDIC SURGERY     "on my legs"       Family History  Problem Relation Age of Onset  . Diabetes Brother   . Diabetes Sister   . Diabetes Father   . Hypertension Father   . Diabetes Mother   . Hypertension Mother   . Hypertension Sister   . Hypertension Brother     Social History   Tobacco Use  . Smoking status: Former Smoker    Packs/day: 1.00    Years: 35.00    Pack years: 35.00    Quit date: 01/25/2009    Years since quitting: 10.6  . Smokeless tobacco: Never Used  Vaping Use  . Vaping Use: Never used  Substance Use Topics  . Alcohol use: No    Comment: former alcoholic  . Drug use: No    Comment: former cocaine abuse    Home Medications Prior to Admission medications   Medication Sig Start Date End Date Taking? Authorizing Provider  albuterol (VENTOLIN HFA) 108 (90 Base) MCG/ACT inhaler Inhale 2 puffs into the lungs every 6 (six) hours as needed for wheezing or shortness of breath. 04/05/19  Yes Shah, Pratik D, DO  amLODipine (NORVASC) 10 MG tablet Take 5 mg by mouth daily.   Yes [provider]  aspirin EC 81 MG tablet Take 81 mg by mouth daily.   Yes [provider]  cetirizine (ZYRTEC) 10 MG tablet Take 10 mg by mouth 2 (two) times daily.    Yes [provider]  finasteride (PROSCAR) 5 MG tablet Take 5 mg by mouth daily.   Yes [provider]  fluticasone (FLONASE) 50 MCG/ACT nasal spray Place 2 sprays into both nostrils daily as needed for allergies or rhinitis.    Yes [provider]  furosemide (LASIX) 40 MG tablet Take 40 mg by mouth daily.   Yes [provider]  gabapentin (NEURONTIN) 300 MG capsule Take 900 mg by mouth 3 (three) times daily.    Yes [provider]  guaiFENesin-dextromethorphan (ROBITUSSIN DM) 100-10 MG/5ML syrup Take 5 mLs by mouth every 4 (four) hours as needed for cough. 04/05/19  Yes Shah, Pratik D, DO  insulin aspart  (NOVOLOG) 100 UNIT/ML FlexPen Inject 10 Units into the skin 3 (three) times daily with meals.    Yes [provider]  insulin glargine (LANTUS) 100 UNIT/ML injection Inject 50 Units into the skin at bedtime.    Yes [provider]  isosorbide mononitrate (IMDUR) 30 MG 24 hr tablet Take 30 mg by mouth daily. 12/20/18  Yes [provider]  latanoprost (XALATAN) 0.005 % ophthalmic solution Place 1 drop into both eyes at bedtime.   Yes [provider]  lisinopril (ZESTRIL) 20 MG tablet Take 20 mg by mouth daily.   Yes  [provider]  magnesium oxide (MAG-OX) 400 MG tablet Take 400 mg by mouth daily.   Yes [provider]  Melatonin 5 MG TABS Take 5 mg by mouth at bedtime.   Yes [provider]  mesalamine (CANASA) 1000 MG suppository Place 1,000 mg rectally at bedtime.   Yes [provider]  metFORMIN (GLUCOPHAGE) 500 MG tablet Take 1,000 mg by mouth 2 (two) times daily with a meal.    Yes [provider]  metoprolol succinate (TOPROL-XL) 25 MG 24 hr tablet Take 25 mg by mouth daily.   Yes [provider]  mirtazapine (REMERON) 7.5 MG tablet Take 7.5 mg by mouth at bedtime.   Yes [provider]  naloxone (NARCAN) nasal spray 4 mg/0.1 mL Place 1 spray into the nose once as needed (opiod overdose).    Yes [provider]  potassium chloride SA (K-DUR,KLOR-CON) 20 MEQ tablet Take 20 mEq by mouth daily.   Yes [provider]  ranolazine (RANEXA) 1000 MG SR tablet Take 1,000 mg by mouth 2 (two) times daily. 08/13/19  Yes [provider]  rosuvastatin (CRESTOR) 20 MG tablet Take 20 mg by mouth daily.   Yes [provider]  terazosin (HYTRIN) 2 MG capsule Take 4 mg by mouth at bedtime.   Yes [provider]  trospium (SANCTURA) 20 MG tablet Take 20 mg by mouth 2 (two) times daily.   Yes [provider]  venlafaxine XR (EFFEXOR-XR) 75 MG 24 hr capsule Take 225  mg by mouth daily with breakfast.   Yes [provider]  Calcium Carbonate-Vitamin D (CALCIUM 600+D) 600-400 MG-UNIT tablet Take 1 tablet by mouth 2 (two) times daily.     [provider]    Allergies    Bee venom and Questran [cholestyramine]  Review of Systems   Review of Systems  Constitutional: Positive for activity change.  Respiratory: Negative for shortness of breath.   Cardiovascular: Negative for chest pain.  Gastrointestinal: Negative for nausea and vomiting.  Musculoskeletal: Negative for neck pain.  Neurological: Positive for dizziness. Negative for headaches.  Hematological: Does not bruise/bleed easily.  All other systems reviewed and are negative.   Physical Exam Updated Vital Signs BP 117/83 (BP Location: Right Arm)   Pulse 61   Temp 98.7 F (37.1 C) (Oral)   Resp 16   Ht 6\' 3"  (1.905 m)   Wt 99.3 kg   SpO2 99%   BMI 27.37 kg/m   Physical Exam Vitals and nursing note reviewed.  Constitutional:      Appearance: He is well-developed.  HENT:     Head: Atraumatic.  Eyes:     Extraocular Movements: Extraocular movements intact.  Cardiovascular:     Rate and Rhythm: Normal rate.  Pulmonary:     Effort: Pulmonary effort is normal.  Musculoskeletal:     Cervical back: Neck supple. No rigidity.  Skin:    General: Skin is warm.  Neurological:     General: No focal deficit present.     Mental Status: He is alert and oriented to person, place, and time.     Cranial Nerves: No cranial nerve deficit.     Sensory: No sensory deficit.     Motor: No weakness.     Coordination: Coordination normal.     Comments: Patient ambulated in the room without any ataxia.     ED Results / Procedures / Treatments   Labs (all labs ordered are listed, but only abnormal results  are displayed) Labs Reviewed  CBC  BASIC METABOLIC PANEL  URINALYSIS, ROUTINE W REFLEX MICROSCOPIC  CBG MONITORING, ED    EKG EKG Interpretation  Date/Time:  Saturday  September 08 2019 18:02:31 EDT Ventricular Rate:  72 PR Interval:  156 QRS Duration: 164 QT Interval:  460 QTC Calculation: 503 R Axis:   113 Text Interpretation: Normal sinus rhythm Right bundle branch block Left posterior fascicular block Abnormal ECG No acute changes new TWI in the anterolateral leads Confirmed by Varney Biles [phone]) on 09/08/2019 9:14:06 PM   Radiology No results found.  Procedures Procedures (including critical care time)  Medications Ordered in ED Medications - No data to display  ED Course  I have reviewed the triage vital signs and the nursing notes.  Pertinent labs & imaging results that were available during my care of the patient were reviewed by me and considered in my medical decision making (see chart for details).    MDM Rules/Calculators/A&P                          Patient comes into the ER with chief complaint of dizziness and weakness.  He has been feeling unwell for the last several days.  He had near syncope or syncope often in the last month.  Without any cardiac prodrome.  Typically has some dizziness prior to the syncope and he has had ataxic gait.  Patient has multiple medical comorbidities.  Differential diagnosis included PE, ACS, arrhythmia, TIA, posterior circulation disruption, orthostatic hypotension, medication side effects.  Patient was just seen in the ER recently.  He had CT head, delta troponin, EKG, basic labs which are all reassuring.  Patient's neuro exam right now is also reassuring and he ambulated without any problems.  I think it is best for patient to be seen as an outpatient by both cardiologist and a neurologist.  The former can contact him with Holter monitor and do an echo, the latter can evaluate for the movement disorder/ataxia.  Strict ER return precautions have been discussed, and patient is agreeing with the plan and is comfortable with the workup done and the recommendations from the ER.   Final Clinical  Impression(s) / ED Diagnoses Final diagnoses:  Syncope and collapse  Ataxia    Rx / DC Orders ED Discharge Orders    None       Varney Biles, MD 09/08/19 2227

## 2019-09-08 NOTE — ED Triage Notes (Signed)
Pt c/o of feeling lightheaded, losing balance, falling x 1 month.  VSS NAD

## 2019-10-22 ENCOUNTER — Other Ambulatory Visit (HOSPITAL_COMMUNITY): Payer: Self-pay | Admitting: Family Medicine

## 2019-10-22 ENCOUNTER — Other Ambulatory Visit: Payer: Self-pay | Admitting: Family Medicine

## 2019-10-22 DIAGNOSIS — Z1289 Encounter for screening for malignant neoplasm of other sites: Secondary | ICD-10-CM

## 2019-10-22 DIAGNOSIS — K7460 Unspecified cirrhosis of liver: Secondary | ICD-10-CM

## 2019-10-31 ENCOUNTER — Other Ambulatory Visit: Payer: Self-pay

## 2019-10-31 ENCOUNTER — Ambulatory Visit (HOSPITAL_COMMUNITY)
Admission: RE | Admit: 2019-10-31 | Discharge: 2019-10-31 | Disposition: A | Discharge status: Home / Self Care | Payer: No Typology Code available for payment source | Source: Ambulatory Visit | Attending: Family Medicine | Admitting: Family Medicine

## 2019-10-31 DIAGNOSIS — Z1289 Encounter for screening for malignant neoplasm of other sites: Secondary | ICD-10-CM | POA: Diagnosis present

## 2019-10-31 DIAGNOSIS — K7460 Unspecified cirrhosis of liver: Secondary | ICD-10-CM | POA: Diagnosis not present

## 2019-12-08 ENCOUNTER — Encounter (HOSPITAL_COMMUNITY): Payer: Self-pay | Admitting: *Deleted

## 2019-12-08 ENCOUNTER — Other Ambulatory Visit: Payer: Self-pay

## 2019-12-08 ENCOUNTER — Emergency Department (HOSPITAL_COMMUNITY)
Admission: EM | Admit: 2019-12-08 | Discharge: 2019-12-08 | Disposition: A | Discharge status: Home / Self Care | Payer: No Typology Code available for payment source | Attending: Emergency Medicine | Admitting: Emergency Medicine

## 2019-12-08 DIAGNOSIS — Z87891 Personal history of nicotine dependence: Secondary | ICD-10-CM | POA: Insufficient documentation

## 2019-12-08 DIAGNOSIS — Z8616 Personal history of COVID-19: Secondary | ICD-10-CM | POA: Insufficient documentation

## 2019-12-08 DIAGNOSIS — Z7982 Long term (current) use of aspirin: Secondary | ICD-10-CM | POA: Diagnosis not present

## 2019-12-08 DIAGNOSIS — Z7984 Long term (current) use of oral hypoglycemic drugs: Secondary | ICD-10-CM | POA: Diagnosis not present

## 2019-12-08 DIAGNOSIS — I2510 Atherosclerotic heart disease of native coronary artery without angina pectoris: Secondary | ICD-10-CM | POA: Diagnosis not present

## 2019-12-08 DIAGNOSIS — Z79899 Other long term (current) drug therapy: Secondary | ICD-10-CM | POA: Diagnosis not present

## 2019-12-08 DIAGNOSIS — Z955 Presence of coronary angioplasty implant and graft: Secondary | ICD-10-CM | POA: Insufficient documentation

## 2019-12-08 DIAGNOSIS — Z794 Long term (current) use of insulin: Secondary | ICD-10-CM | POA: Insufficient documentation

## 2019-12-08 DIAGNOSIS — J45909 Unspecified asthma, uncomplicated: Secondary | ICD-10-CM | POA: Insufficient documentation

## 2019-12-08 DIAGNOSIS — Z8546 Personal history of malignant neoplasm of prostate: Secondary | ICD-10-CM | POA: Insufficient documentation

## 2019-12-08 DIAGNOSIS — E119 Type 2 diabetes mellitus without complications: Secondary | ICD-10-CM | POA: Insufficient documentation

## 2019-12-08 DIAGNOSIS — R42 Dizziness and giddiness: Secondary | ICD-10-CM | POA: Insufficient documentation

## 2019-12-08 DIAGNOSIS — I10 Essential (primary) hypertension: Secondary | ICD-10-CM | POA: Insufficient documentation

## 2019-12-08 LAB — CBC
HCT: 42 % (ref 39.0–52.0)
Hemoglobin: 13.9 g/dL (ref 13.0–17.0)
MCH: 29 pg (ref 26.0–34.0)
MCHC: 33.1 g/dL (ref 30.0–36.0)
MCV: 87.5 fL (ref 80.0–100.0)
Platelets: 185 10*3/uL (ref 150–400)
RBC: 4.8 MIL/uL (ref 4.22–5.81)
RDW: 12.7 % (ref 11.5–15.5)
WBC: 7.7 10*3/uL (ref 4.0–10.5)
nRBC: 0 % (ref 0.0–0.2)

## 2019-12-08 LAB — COMPREHENSIVE METABOLIC PANEL
ALT: 19 U/L (ref 0–44)
AST: 20 U/L (ref 15–41)
Albumin: 3.9 g/dL (ref 3.5–5.0)
Alkaline Phosphatase: 45 U/L (ref 38–126)
Anion gap: 8 (ref 5–15)
BUN: 15 mg/dL (ref 8–23)
CO2: 29 mmol/L (ref 22–32)
Calcium: 9.2 mg/dL (ref 8.9–10.3)
Chloride: 102 mmol/L (ref 98–111)
Creatinine, Ser: 1.11 mg/dL (ref 0.61–1.24)
GFR, Estimated: >60 mL/min (ref >60)
Glucose, Bld: 84 mg/dL (ref 70–99)
Potassium: 3.6 mmol/L (ref 3.5–5.1)
Sodium: 139 mmol/L (ref 135–145)
Total Bilirubin: 0.6 mg/dL (ref 0.3–1.2)
Total Protein: 6.7 g/dL (ref 6.5–8.1)

## 2019-12-08 MED ORDER — SODIUM CHLORIDE 0.9 % IV BOLUS
1000.0000 mL | Freq: Once | INTRAVENOUS | Status: AC
  Administered 2019-12-08: 1000 mL via INTRAVENOUS

## 2019-12-08 NOTE — ED Triage Notes (Signed)
The dizziness is not constant, just with sitting to standing position.

## 2019-12-08 NOTE — Discharge Instructions (Addendum)
It was our pleasure to provide your ER care today - we hope that you feel better.  Rest. Drink plenty of fluids.   Follow up with your doctor this week regarding your recent symptoms, and your medications - discuss possible further change in medications then.   Return to ER if worse, new symptoms, new or severe pain, chest pain, trouble breathing, fainting, or other concern.

## 2019-12-08 NOTE — ED Provider Notes (Signed)
[facility] Provider Note   CSN: [id number] Arrival date & time: 12/08/19  1457     History Chief Complaint  Patient presents with  . Dizziness    [name] is a 59 y.o. male.  Patient c/o feeling lightheaded/dizzy when stands for the past 2-3 weeks. Symptoms acute onset, persistent, episodic, states generally only notices when stands up. No syncope. No associated cp or discomfort. No palpitations. No new sob or unusual doe. No headache. Denies any recent blood loss, no melena or hematochezia. Indicates saw his doctor for same, and norvasc was d/c'd. No new meds started. Normal appetite. No nvd. No dysuria or gu c/o. No abd or flank pain.  No room spinning or vertigo. No change in speech or vision. No numbness/weakness or loss of normal functional ability. No problems w gait, balance or coordination.   The history is provided by the patient.  Dizziness Associated symptoms: no blood in stool, no chest pain, no diarrhea, no headaches, no palpitations, no shortness of breath, no vomiting and no weakness        Past Medical History:  Diagnosis Date  . Asthma   . Cancer [facility])    prostate  . Chronic back pain   . Chronic neck pain   . Cirrhosis of liver ([place])   . Complicated migraine 03/08/9415  . Coronary artery disease   . Depression   . Diabetes mellitus without complication ([place])   . Gallstones   . Headache(784.0)   . Hepatitis C   . Hypercholesteremia   . Hypertension   . Left leg paresthesias   . Myocardial infarction ([place])   . Stroke [facility])    TIA  . Vertigo     Patient Active Problem List   Diagnosis Date Noted  . COVID-19 virus infection 04/03/2019  . Sepsis ([place])   . Unstable angina ([place]) 04/28/2017  . Encounter for screening for upper gastrointestinal disorder   . Columnar epithelial-lined lower esophagus   . Secondary esophageal varices without bleeding ([place])   . Encounter for screening colonoscopy   . Rectum inflammation    . Hematochezia   . Syncope, near 01/24/2017  . Hypotension 10/11/2012  . CAD (coronary artery disease) 10/11/2012  . S/P CABG (coronary artery bypass graft) 10/11/2012  . Cervical spondylosis 09/22/2011  . Neck pain 09/22/2011  . Syncope 06/20/2011  . ARF (acute renal failure) ([place]) 06/20/2011  . Complicated migraine 40/81/4481  . Hypertension   . Hepatitis C   . Cirrhosis of liver ([place])   . Depression   . Chronic back pain   . Left leg paresthesias   . Hypercholesteremia   . Chronic neck pain   . EHUDJSHF(026.3)     Past Surgical History:  Procedure Laterality Date  . BYPASS GRAFT     triple bypass surgery  . CARDIAC SURGERY    . COLONOSCOPY WITH PROPOFOL N/A 02/15/2017   Procedure: COLONOSCOPY WITH PROPOFOL;  Surgeon: Virgel Manifold, MD;  Location: [place];  Service: Endoscopy;  Laterality: N/A;  . CORONARY ARTERY BYPASS GRAFT    . ESOPHAGOGASTRODUODENOSCOPY (EGD) WITH PROPOFOL N/A 02/15/2017   Procedure: ESOPHAGOGASTRODUODENOSCOPY (EGD) WITH PROPOFOL;  Surgeon: Virgel Manifold, MD;  Location: [place];  Service: Endoscopy;  Laterality: N/A;  . HAND SURGERY  1984  . LEFT HEART CATH AND CORONARY ANGIOGRAPHY Left 04/29/2017   Procedure: LEFT HEART CATH AND CORONARY ANGIOGRAPHY;  Surgeon: Dionisio David, MD;  Location: [facility];  Service:  Cardiovascular;  Laterality: Left;  . LEFT HEART CATH AND CORONARY ANGIOGRAPHY Left 01/08/2019   Procedure: LEFT HEART CATH AND CORONARY ANGIOGRAPHY;  Surgeon: Dionisio David, MD;  Location: [facility];  Service: Cardiovascular;  Laterality: Left;  . ORTHOPEDIC SURGERY     "on my legs"       Family History  Problem Relation Age of Onset  . Diabetes Brother   . Diabetes Sister   . Diabetes Father   . Hypertension Father   . Diabetes Mother   . Hypertension Mother   . Hypertension Sister   . Hypertension Brother     Social History   Tobacco Use  . Smoking status: Former  Smoker    Packs/day: 1.00    Years: 35.00    Pack years: 35.00    Quit date: 01/25/2009    Years since quitting: 10.8  . Smokeless tobacco: Never Used  Vaping Use  . Vaping Use: Never used  Substance Use Topics  . Alcohol use: No    Comment: former alcoholic  . Drug use: No    Comment: former cocaine abuse    Home Medications Prior to Admission medications   Medication Sig Start Date End Date Taking? Authorizing Provider  albuterol (VENTOLIN HFA) 108 (90 Base) MCG/ACT inhaler Inhale 2 puffs into the lungs every 6 (six) hours as needed for wheezing or shortness of breath. 04/05/19   Manuella Ghazi, Pratik D, DO  amLODipine (NORVASC) 10 MG tablet Take 5 mg by mouth daily.    [provider]  aspirin EC 81 MG tablet Take 81 mg by mouth daily.    [provider]  Calcium Carbonate-Vitamin D (CALCIUM 600+D) 600-400 MG-UNIT tablet Take 1 tablet by mouth 2 (two) times daily.     [provider]  cetirizine (ZYRTEC) 10 MG tablet Take 10 mg by mouth 2 (two) times daily.     [provider]  finasteride (PROSCAR) 5 MG tablet Take 5 mg by mouth daily.    [provider]  fluticasone (FLONASE) 50 MCG/ACT nasal spray Place 2 sprays into both nostrils daily as needed for allergies or rhinitis.     [provider]  furosemide (LASIX) 40 MG tablet Take 40 mg by mouth daily.    [provider]  gabapentin (NEURONTIN) 300 MG capsule Take 900 mg by mouth 3 (three) times daily.     [provider]  guaiFENesin-dextromethorphan (ROBITUSSIN DM) 100-10 MG/5ML syrup Take 5 mLs by mouth every 4 (four) hours as needed for cough. 04/05/19   Manuella Ghazi, Pratik D, DO  insulin aspart (NOVOLOG) 100 UNIT/ML FlexPen Inject 10 Units into the skin 3 (three) times daily with meals.     [provider]  insulin glargine (LANTUS) 100 UNIT/ML injection Inject 50 Units into the skin at bedtime.     [provider]  isosorbide mononitrate (IMDUR) 30 MG 24  hr tablet Take 30 mg by mouth daily. 12/20/18   [provider]  latanoprost (XALATAN) 0.005 % ophthalmic solution Place 1 drop into both eyes at bedtime.    [provider]  lisinopril (ZESTRIL) 20 MG tablet Take 20 mg by mouth daily.    [provider]  magnesium oxide (MAG-OX) 400 MG tablet Take 400 mg by mouth daily.    [provider]  Melatonin 5 MG TABS Take 5 mg by mouth at bedtime.    [provider]  mesalamine (CANASA) 1000 MG suppository Place 1,000 mg rectally at bedtime.  [provider]  metFORMIN (GLUCOPHAGE) 500 MG tablet Take 1,000 mg by mouth 2 (two) times daily with a meal.     [provider]  metoprolol succinate (TOPROL-XL) 25 MG 24 hr tablet Take 25 mg by mouth daily.    [provider]  mirtazapine (REMERON) 7.5 MG tablet Take 7.5 mg by mouth at bedtime.    [provider]  naloxone [facility] nasal spray 4 mg/0.1 mL Place 1 spray into the nose once as needed (opiod overdose).     [provider]  potassium chloride SA (K-DUR,KLOR-CON) 20 MEQ tablet Take 20 mEq by mouth daily.    [provider]  ranolazine (RANEXA) 1000 MG SR tablet Take 1,000 mg by mouth 2 (two) times daily. 08/13/19   [provider]  rosuvastatin (CRESTOR) 20 MG tablet Take 20 mg by mouth daily.    [provider]  terazosin (HYTRIN) 2 MG capsule Take 4 mg by mouth at bedtime.    [provider]  trospium (SANCTURA) 20 MG tablet Take 20 mg by mouth 2 (two) times daily.    [provider]  venlafaxine XR (EFFEXOR-XR) 75 MG 24 hr capsule Take 225 mg by mouth daily with breakfast.    [provider]    Allergies    Bee venom and Questran [cholestyramine]  Review of Systems   Review of Systems  Constitutional: Negative for chills and fever.  HENT: Negative for sore throat.   Eyes: Negative for redness.  Respiratory: Negative for cough and shortness of breath.    Cardiovascular: Negative for chest pain, palpitations and leg swelling.  Gastrointestinal: Negative for abdominal pain, blood in stool, diarrhea and vomiting.  Genitourinary: Negative for dysuria and flank pain.  Musculoskeletal: Negative for back pain and neck pain.  Skin: Negative for rash.  Neurological: Positive for dizziness and light-headedness. Negative for speech difficulty, weakness and headaches.  Hematological: Does not bruise/bleed easily.  Psychiatric/Behavioral: Negative for confusion.    Physical Exam Updated Vital Signs BP 104/75 (BP Location: Right Arm)   Pulse 80   Temp 97.6 F (36.4 C) (Oral)   Resp 16   SpO2 97%   Physical Exam Vitals and nursing note reviewed.  Constitutional:      Appearance: Normal appearance. He is well-developed.  HENT:     Head: Atraumatic.     Nose: Nose normal.     Mouth/Throat:     Mouth: Mucous membranes are moist.     Pharynx: Oropharynx is clear.  Eyes:     General: No scleral icterus.    Conjunctiva/sclera: Conjunctivae normal.     Pupils: Pupils are equal, round, and reactive to light.  Neck:     Vascular: No carotid bruit.     Trachea: No tracheal deviation.  Cardiovascular:     Rate and Rhythm: Normal rate and regular rhythm.     Pulses: Normal pulses.     Heart sounds: Normal heart sounds. No murmur heard.  No friction rub. No gallop.   Pulmonary:     Effort: Pulmonary effort is normal. No accessory muscle usage or respiratory distress.     Breath sounds: Normal breath sounds.  Abdominal:     General: Bowel sounds are normal. There is no distension.     Palpations: Abdomen is soft.     Tenderness: There is no abdominal tenderness. There is no guarding.  Genitourinary:    Comments: No cva tenderness. Musculoskeletal:        General: No  swelling.     Cervical back: Normal range of motion and neck supple. No rigidity.  Skin:    General: Skin is warm and dry.     Findings: No rash.  Neurological:     Mental  Status: He is alert.     Comments: Alert, speech clear. No dysarthria or aphasia. Motor intact bil, stre 5/5. No pronator drift. Sens grossly intact bil. Steady gait. No ataxia.   Psychiatric:        Mood and Affect: Mood normal.     ED Results / Procedures / Treatments   Labs (all labs ordered are listed, but only abnormal results are displayed) Results for orders placed or performed during the hospital encounter of 12/08/19  Comprehensive metabolic panel  Result Value Ref Range   Sodium 139 135 - 145 mmol/L   Potassium 3.6 3.5 - 5.1 mmol/L   Chloride 102 98 - 111 mmol/L   CO2 29 22 - 32 mmol/L   Glucose, Bld 84 70 - 99 mg/dL   BUN 15 8 - 23 mg/dL   Creatinine, Ser 1.11 0.61 - 1.24 mg/dL   Calcium 9.2 8.9 - 10.3 mg/dL   Total Protein 6.7 6.5 - 8.1 g/dL   Albumin 3.9 3.5 - 5.0 g/dL   AST 20 15 - 41 U/L   ALT 19 0 - 44 U/L   Alkaline Phosphatase 45 38 - 126 U/L   Total Bilirubin 0.6 0.3 - 1.2 mg/dL   GFR, Estimated >60 >60 mL/min   Anion gap 8 5 - 15  CBC  Result Value Ref Range   WBC 7.7 4.0 - 10.5 K/uL   RBC 4.80 4.22 - 5.81 MIL/uL   Hemoglobin 13.9 13.0 - 17.0 g/dL   HCT 42.0 39 - 52 %   MCV 87.5 80.0 - 100.0 fL   MCH 29.0 26.0 - 34.0 pg   MCHC 33.1 30.0 - 36.0 g/dL   RDW 12.7 11.5 - 15.5 %   Platelets 185 150 - 400 K/uL   nRBC 0.0 0.0 - 0.2 %   EKG EKG Interpretation  Date/Time:  Saturday December 08 2019 18:00:26 EST Ventricular Rate:  72 PR Interval:    QRS Duration: 162 QT Interval:  450 QTC Calculation: 493 R Axis:     Text Interpretation: Sinus rhythm Right bundle branch block Left posterior fasicular block Non-specific ST-t changes Confirmed by Lajean Saver [phone]) on 12/08/2019 6:07:58 PM   Radiology No results found.  Procedures Procedures (including critical care time)  Medications Ordered in ED Medications - No data to display  ED Course  I have reviewed the triage vital signs and the nursing notes.  Pertinent labs & imaging results  that were available during my care of the patient were reviewed by me and considered in my medical decision making (see chart for details).    MDM Rules/Calculators/A&P                          Stat labs. Po fluids.   Reviewed nursing notes and prior charts for additional history.  Prior ED visits this past summer with similar symptoms, bp similar then.   Initial labs reviewed/interpreted by me - wbc normal, hgb normal.   Pt is orthostatic, with reproduction symptoms. Iv ns bolus.   Po fluids.   Recheck pt, says feels fine, no faintness or lightheadedness, no dizziness. No pain or discomfort. No sob. Pt improved. Tolerating po.   Pt currently appears stable  for d/c.       Final Clinical Impression(s) / ED Diagnoses Final diagnoses:  None    Rx / DC Orders ED Discharge Orders    None       Lajean Saver, MD 12/08/19 2035

## 2019-12-08 NOTE — ED Notes (Signed)
EKG handed to Dr. Ashok Cordia

## 2019-12-08 NOTE — ED Triage Notes (Signed)
Pt with dizziness for 3 weeks and has been seen by cardiologist and was taken off medication Norvasc.

## 2019-12-19 ENCOUNTER — Other Ambulatory Visit: Payer: Self-pay

## 2019-12-19 ENCOUNTER — Emergency Department (HOSPITAL_COMMUNITY): Payer: No Typology Code available for payment source

## 2019-12-19 ENCOUNTER — Emergency Department (HOSPITAL_COMMUNITY)
Admission: EM | Admit: 2019-12-19 | Discharge: 2019-12-19 | Disposition: A | Discharge status: Home / Self Care | Payer: No Typology Code available for payment source | Attending: Emergency Medicine | Admitting: Emergency Medicine

## 2019-12-19 ENCOUNTER — Encounter (HOSPITAL_COMMUNITY): Payer: Self-pay

## 2019-12-19 DIAGNOSIS — Z8673 Personal history of transient ischemic attack (TIA), and cerebral infarction without residual deficits: Secondary | ICD-10-CM | POA: Insufficient documentation

## 2019-12-19 DIAGNOSIS — I959 Hypotension, unspecified: Secondary | ICD-10-CM | POA: Diagnosis not present

## 2019-12-19 DIAGNOSIS — E785 Hyperlipidemia, unspecified: Secondary | ICD-10-CM | POA: Insufficient documentation

## 2019-12-19 DIAGNOSIS — J45909 Unspecified asthma, uncomplicated: Secondary | ICD-10-CM | POA: Diagnosis not present

## 2019-12-19 DIAGNOSIS — Z8546 Personal history of malignant neoplasm of prostate: Secondary | ICD-10-CM | POA: Insufficient documentation

## 2019-12-19 DIAGNOSIS — I951 Orthostatic hypotension: Secondary | ICD-10-CM

## 2019-12-19 DIAGNOSIS — R55 Syncope and collapse: Secondary | ICD-10-CM | POA: Diagnosis not present

## 2019-12-19 DIAGNOSIS — E860 Dehydration: Secondary | ICD-10-CM | POA: Insufficient documentation

## 2019-12-19 DIAGNOSIS — Z951 Presence of aortocoronary bypass graft: Secondary | ICD-10-CM | POA: Insufficient documentation

## 2019-12-19 DIAGNOSIS — Z87891 Personal history of nicotine dependence: Secondary | ICD-10-CM | POA: Diagnosis not present

## 2019-12-19 DIAGNOSIS — Z7982 Long term (current) use of aspirin: Secondary | ICD-10-CM | POA: Diagnosis not present

## 2019-12-19 DIAGNOSIS — Z79899 Other long term (current) drug therapy: Secondary | ICD-10-CM | POA: Insufficient documentation

## 2019-12-19 DIAGNOSIS — Z794 Long term (current) use of insulin: Secondary | ICD-10-CM | POA: Diagnosis not present

## 2019-12-19 DIAGNOSIS — E1169 Type 2 diabetes mellitus with other specified complication: Secondary | ICD-10-CM | POA: Insufficient documentation

## 2019-12-19 DIAGNOSIS — Z8616 Personal history of COVID-19: Secondary | ICD-10-CM | POA: Insufficient documentation

## 2019-12-19 DIAGNOSIS — I25119 Atherosclerotic heart disease of native coronary artery with unspecified angina pectoris: Secondary | ICD-10-CM | POA: Diagnosis not present

## 2019-12-19 DIAGNOSIS — R42 Dizziness and giddiness: Secondary | ICD-10-CM | POA: Diagnosis present

## 2019-12-19 DIAGNOSIS — Z7984 Long term (current) use of oral hypoglycemic drugs: Secondary | ICD-10-CM | POA: Insufficient documentation

## 2019-12-19 LAB — BASIC METABOLIC PANEL
Anion gap: 9 (ref 5–15)
BUN: 10 mg/dL (ref 8–23)
CO2: 29 mmol/L (ref 22–32)
Calcium: 8.9 mg/dL (ref 8.9–10.3)
Chloride: 102 mmol/L (ref 98–111)
Creatinine, Ser: 1.13 mg/dL (ref 0.61–1.24)
GFR, Estimated: >60 mL/min (ref >60)
Glucose, Bld: 120 mg/dL — ABNORMAL HIGH (ref 70–99)
Potassium: 3.6 mmol/L (ref 3.5–5.1)
Sodium: 140 mmol/L (ref 135–145)

## 2019-12-19 LAB — CBC
HCT: 43.4 % (ref 39.0–52.0)
Hemoglobin: 13.9 g/dL (ref 13.0–17.0)
MCH: 28.6 pg (ref 26.0–34.0)
MCHC: 32 g/dL (ref 30.0–36.0)
MCV: 89.3 fL (ref 80.0–100.0)
Platelets: 195 10*3/uL (ref 150–400)
RBC: 4.86 MIL/uL (ref 4.22–5.81)
RDW: 12.9 % (ref 11.5–15.5)
WBC: 8.2 10*3/uL (ref 4.0–10.5)
nRBC: 0 % (ref 0.0–0.2)

## 2019-12-19 LAB — HEPATIC FUNCTION PANEL
ALT: 45 U/L — ABNORMAL HIGH (ref 0–44)
AST: 64 U/L — ABNORMAL HIGH (ref 15–41)
Albumin: 3.8 g/dL (ref 3.5–5.0)
Alkaline Phosphatase: 59 U/L (ref 38–126)
Bilirubin, Direct: 0.2 mg/dL (ref 0.0–0.2)
Indirect Bilirubin: 0.1 mg/dL — ABNORMAL LOW (ref 0.3–0.9)
Total Bilirubin: 0.3 mg/dL (ref 0.3–1.2)
Total Protein: 6.7 g/dL (ref 6.5–8.1)

## 2019-12-19 LAB — CBG MONITORING, ED: Glucose-Capillary: 127 mg/dL — ABNORMAL HIGH (ref 70–99)

## 2019-12-19 MED ORDER — SODIUM CHLORIDE 0.9 % IV BOLUS
1000.0000 mL | Freq: Once | INTRAVENOUS | Status: AC
  Administered 2019-12-19: 1000 mL via INTRAVENOUS

## 2019-12-19 NOTE — ED Triage Notes (Signed)
PT reports dizziness and syncopal episodes every other day for the past 2 weeks.  Denies any pain.  Reports was changing his car battery this morning and passed out.  Reports fell and hit head on concrete.

## 2019-12-19 NOTE — Discharge Instructions (Addendum)
Stop taking your sanctura and follow up with your family md or cardiologist next week for recheck.  Return if problems

## 2019-12-19 NOTE — ED Provider Notes (Signed)
[facility] EMERGENCY DEPARTMENT Provider Note   CSN: [id number] Arrival date & time: 12/19/19  4193     History Chief Complaint  Patient presents with  . Loss of Consciousness    [name] is a 59 y.o. male.  Patient had an episode where he was dizzy and passed out.  He has had several dizzy episodes.  Patient does not complain of any pain is not having any vertigo symptoms  The history is provided by the patient and medical records. No language interpreter was used.  Loss of Consciousness Episode history:  Single Most recent episode:  Today Timing:  Intermittent Progression:  Waxing and waning Chronicity:  New Context: not blood draw   Witnessed: no   Relieved by:  Nothing Worsened by:  Nothing Ineffective treatments:  None tried Associated symptoms: dizziness   Associated symptoms: no anxiety, no chest pain, no headaches and no seizures        Past Medical History:  Diagnosis Date  . Asthma   . Cancer [facility])    prostate  . Chronic back pain   . Chronic neck pain   . Cirrhosis of liver ([place])   . Complicated migraine 7/90/2409  . Coronary artery disease   . Depression   . Diabetes mellitus without complication ([place])   . Gallstones   . Headache(784.0)   . Hepatitis C   . Hypercholesteremia   . Hypertension   . Left leg paresthesias   . Myocardial infarction ([place])   . Stroke [facility])    TIA  . Vertigo     Patient Active Problem List   Diagnosis Date Noted  . COVID-19 virus infection 04/03/2019  . Sepsis ([place])   . Unstable angina ([place]) 04/28/2017  . Encounter for screening for upper gastrointestinal disorder   . Columnar epithelial-lined lower esophagus   . Secondary esophageal varices without bleeding ([place])   . Encounter for screening colonoscopy   . Rectum inflammation   . Hematochezia   . Syncope, near 01/24/2017  . Hypotension 10/11/2012  . CAD (coronary artery disease) 10/11/2012  . S/P CABG (coronary artery bypass graft) 10/11/2012   . Cervical spondylosis 09/22/2011  . Neck pain 09/22/2011  . Syncope 06/20/2011  . ARF (acute renal failure) ([place]) 06/20/2011  . Complicated migraine 73/53/2992  . Hypertension   . Hepatitis C   . Cirrhosis of liver ([place])   . Depression   . Chronic back pain   . Left leg paresthesias   . Hypercholesteremia   . Chronic neck pain   . EQASTMHD(622.2)     Past Surgical History:  Procedure Laterality Date  . BYPASS GRAFT     triple bypass surgery  . CARDIAC SURGERY    . COLONOSCOPY WITH PROPOFOL N/A 02/15/2017   Procedure: COLONOSCOPY WITH PROPOFOL;  Surgeon: Virgel Manifold, MD;  Location: [place];  Service: Endoscopy;  Laterality: N/A;  . CORONARY ARTERY BYPASS GRAFT    . ESOPHAGOGASTRODUODENOSCOPY (EGD) WITH PROPOFOL N/A 02/15/2017   Procedure: ESOPHAGOGASTRODUODENOSCOPY (EGD) WITH PROPOFOL;  Surgeon: Virgel Manifold, MD;  Location: [place];  Service: Endoscopy;  Laterality: N/A;  . HAND SURGERY  1984  . LEFT HEART CATH AND CORONARY ANGIOGRAPHY Left 04/29/2017   Procedure: LEFT HEART CATH AND CORONARY ANGIOGRAPHY;  Surgeon: Dionisio David, MD;  Location: [place] CV LAB;  Service: Cardiovascular;  Laterality: Left;  . LEFT HEART CATH AND CORONARY ANGIOGRAPHY Left 01/08/2019   Procedure: LEFT HEART CATH AND CORONARY ANGIOGRAPHY;  Surgeon:  Dionisio David, MD;  Location: [place] CV LAB;  Service: Cardiovascular;  Laterality: Left;  . ORTHOPEDIC SURGERY     "on my legs"       Family History  Problem Relation Age of Onset  . Diabetes Brother   . Diabetes Sister   . Diabetes Father   . Hypertension Father   . Diabetes Mother   . Hypertension Mother   . Hypertension Sister   . Hypertension Brother     Social History   Tobacco Use  . Smoking status: Former Smoker    Packs/day: 1.00    Years: 35.00    Pack years: 35.00    Quit date: 01/25/2009    Years since quitting: 10.9  . Smokeless tobacco: Never Used  Vaping Use  .  Vaping Use: Never used  Substance Use Topics  . Alcohol use: No    Comment: former alcoholic  . Drug use: No    Comment: former cocaine abuse    Home Medications Prior to Admission medications   Medication Sig Start Date End Date Taking? Authorizing Provider  albuterol (VENTOLIN HFA) 108 (90 Base) MCG/ACT inhaler Inhale 2 puffs into the lungs every 6 (six) hours as needed for wheezing or shortness of breath. 04/05/19  Yes Manuella Ghazi, Pratik D, DO  aspirin EC 81 MG tablet Take 81 mg by mouth daily.   Yes [provider]  Calcium Carbonate-Vitamin D (CALCIUM 600+D) 600-400 MG-UNIT tablet Take 1 tablet by mouth 2 (two) times daily.    Yes [provider]  cetirizine (ZYRTEC) 10 MG tablet Take 10 mg by mouth 2 (two) times daily.    Yes [provider]  finasteride (PROSCAR) 5 MG tablet Take 5 mg by mouth daily.   Yes [provider]  fluticasone (FLONASE) 50 MCG/ACT nasal spray Place 2 sprays into both nostrils daily as needed for allergies or rhinitis.    Yes [provider]  guaiFENesin-dextromethorphan (ROBITUSSIN DM) 100-10 MG/5ML syrup Take 5 mLs by mouth every 4 (four) hours as needed for cough. 04/05/19  Yes Shah, Pratik D, DO  insulin aspart (NOVOLOG) 100 UNIT/ML FlexPen Inject 10 Units into the skin 3 (three) times daily with meals.    Yes [provider]  insulin glargine (LANTUS) 100 UNIT/ML injection Inject 50 Units into the skin at bedtime.    Yes [provider]  isosorbide mononitrate (IMDUR) 30 MG 24 hr tablet Take 30 mg by mouth daily. 12/20/18  Yes [provider]  latanoprost (XALATAN) 0.005 % ophthalmic solution Place 1 drop into both eyes at bedtime.   Yes [provider]  lisinopril (ZESTRIL) 20 MG tablet Take 20 mg by mouth daily.   Yes [provider]  magnesium oxide (MAG-OX) 400 MG tablet Take 400 mg by mouth daily.   Yes [provider]  Melatonin 5 MG TABS Take 5 mg by mouth at  bedtime.   Yes [provider]  metFORMIN (GLUCOPHAGE) 500 MG tablet Take 1,000 mg by mouth 2 (two) times daily with a meal.    Yes [provider]  metoprolol succinate (TOPROL-XL) 25 MG 24 hr tablet Take 25 mg by mouth daily.   Yes [provider]  mirtazapine (REMERON) 7.5 MG tablet Take 7.5 mg by mouth at bedtime.   Yes [provider]  naloxone (NARCAN) nasal spray 4 mg/0.1 mL Place 1 spray into the nose once as needed (opiod overdose).    Yes [provider]  potassium chloride SA (  K-DUR,KLOR-CON) 20 MEQ tablet Take 20 mEq by mouth daily.   Yes [provider]  ranolazine (RANEXA) 1000 MG SR tablet Take 1,000 mg by mouth 2 (two) times daily. 08/13/19  Yes [provider]  rosuvastatin (CRESTOR) 20 MG tablet Take 20 mg by mouth daily.    Yes [provider]  terazosin (HYTRIN) 2 MG capsule Take 4 mg by mouth at bedtime.   Yes [provider]  torsemide (DEMADEX) 10 MG tablet Take 10 mg by mouth daily. 11/28/19  Yes [provider]  trospium (SANCTURA) 20 MG tablet Take 20 mg by mouth 2 (two) times daily.   Yes [provider]  venlafaxine XR (EFFEXOR-XR) 75 MG 24 hr capsule Take 225 mg by mouth daily with breakfast.   Yes [provider]  gabapentin (NEURONTIN) 300 MG capsule Take 900 mg by mouth 3 (three) times daily.  Patient not taking: Reported on 12/19/2019    [provider]  mesalamine (CANASA) 1000 MG suppository Place 1,000 mg rectally at bedtime. Patient not taking: Reported on 12/19/2019    [provider]    Allergies    Bee venom and Questran [cholestyramine]  Review of Systems   Review of Systems  Constitutional: Negative for appetite change and fatigue.  HENT: Negative for congestion, ear discharge and sinus pressure.   Eyes: Negative for discharge.  Respiratory: Negative for cough.   Cardiovascular: Positive for syncope. Negative for chest pain.   Gastrointestinal: Negative for abdominal pain and diarrhea.  Genitourinary: Negative for frequency and hematuria.  Musculoskeletal: Negative for back pain.  Skin: Negative for rash.  Neurological: Positive for dizziness. Negative for seizures and headaches.  Psychiatric/Behavioral: Negative for hallucinations.    Physical Exam Updated Vital Signs BP (!) 136/108   Pulse (!) 59   Temp 97.6 F (36.4 C) (Oral)   Resp 16   Ht 6\' 3"  (1.905 m)   Wt 96.6 kg   SpO2 100%   BMI 26.62 kg/m   Physical Exam Vitals and nursing note reviewed.  Constitutional:      Appearance: He is well-developed.  HENT:     Head: Normocephalic.     Nose: Nose normal.     Mouth/Throat:     Mouth: Mucous membranes are moist.  Eyes:     General: No scleral icterus.    Conjunctiva/sclera: Conjunctivae normal.  Neck:     Thyroid: No thyromegaly.  Cardiovascular:     Rate and Rhythm: Normal rate and regular rhythm.     Heart sounds: No murmur heard.  No friction rub. No gallop.   Pulmonary:     Breath sounds: No stridor. No wheezing or rales.  Chest:     Chest wall: No tenderness.  Abdominal:     General: There is no distension.     Tenderness: There is no abdominal tenderness. There is no rebound.  Musculoskeletal:        General: Normal range of motion.     Cervical back: Neck supple.  Lymphadenopathy:     Cervical: No cervical adenopathy.  Skin:    Findings: No erythema or rash.  Neurological:     Mental Status: He is alert and oriented to person, place, and time.     Motor: No abnormal muscle tone.     Coordination: Coordination normal.  Psychiatric:        Behavior: Behavior normal.     ED Results / Procedures / Treatments   Labs (all labs ordered are listed,  but only abnormal results are displayed) Labs Reviewed  BASIC METABOLIC PANEL - Abnormal; Notable for the following components:      Result Value   Glucose, Bld 120 (*)    All other components within normal limits  HEPATIC  FUNCTION PANEL - Abnormal; Notable for the following components:   AST 64 (*)    ALT 45 (*)    Indirect Bilirubin 0.1 (*)    All other components within normal limits  CBG MONITORING, ED - Abnormal; Notable for the following components:   Glucose-Capillary 127 (*)    All other components within normal limits  CBC  URINALYSIS, ROUTINE W REFLEX MICROSCOPIC    EKG None  Radiology CT Head Wo Contrast  Result Date: 12/19/2019 CLINICAL DATA:  Multiple falls. EXAM: CT HEAD WITHOUT CONTRAST TECHNIQUE: Contiguous axial images were obtained from the base of the skull through the vertex without intravenous contrast. COMPARISON:  September 04, 2019. FINDINGS: Brain: No evidence of acute infarction, hemorrhage, hydrocephalus, extra-axial collection or mass lesion/mass effect. Vascular: No hyperdense vessel or unexpected calcification. Skull: Normal. Negative for fracture or focal lesion. Sinuses/Orbits: No acute finding. Other: None. IMPRESSION: Normal head CT. Electronically Signed   By: Marijo Conception M.D.   On: 12/19/2019 10:57   DG Chest Port 1 View  Result Date: 12/19/2019 CLINICAL DATA:  Weakness EXAM: PORTABLE CHEST 1 VIEW COMPARISON:  April 02, 2019 FINDINGS: There is no appreciable edema or airspace opacity. Heart size and pulmonary vascularity are normal. No adenopathy. Patient is status post coronary artery bypass grafting. No adenopathy. No bone lesions. IMPRESSION: No edema or airspace opacity. Heart size normal. Postoperative coronary artery bypass grafting. Electronically Signed   By: Lowella Grip III M.D.   On: 12/19/2019 10:42    Procedures Procedures (including critical care time)  Medications Ordered in ED Medications  sodium chloride 0.9 % bolus 1,000 mL (0 mLs Intravenous Stopped 12/19/19 1236)  sodium chloride 0.9 % bolus 1,000 mL (1,000 mLs Intravenous New Bag/Given 12/19/19 1420)    ED Course  I have reviewed the triage vital signs and the nursing notes.  Pertinent  labs & imaging results that were available during my care of the patient were reviewed by me and considered in my medical decision making (see chart for details).    MDM Rules/Calculators/A&P                          Patient with orthostatic hypotension dehydration.  Patient improved with fluids lab tests were unremarkable.  We will stop his Cloyd Stagers which can cause the orthostatic hypotension he will follow-up with his PCP or his cardiologist CBC chemistries hepatic functions unremarkable.  CT head negative.  Patient with orthostatic hypotension that has improved in the ED ED Diagnoses Final diagnoses:  Dehydration  Orthostatic hypotension    Rx / DC Orders ED Discharge Orders    None       Milton Ferguson, MD 12/25/19 [phone]

## 2020-03-15 ENCOUNTER — Emergency Department: Payer: No Typology Code available for payment source

## 2020-03-15 ENCOUNTER — Emergency Department
Admission: EM | Admit: 2020-03-15 | Discharge: 2020-03-15 | Disposition: A | Discharge status: Home / Self Care | Payer: No Typology Code available for payment source | Attending: Emergency Medicine | Admitting: Emergency Medicine

## 2020-03-15 ENCOUNTER — Other Ambulatory Visit: Payer: Self-pay

## 2020-03-15 DIAGNOSIS — E119 Type 2 diabetes mellitus without complications: Secondary | ICD-10-CM | POA: Insufficient documentation

## 2020-03-15 DIAGNOSIS — I10 Essential (primary) hypertension: Secondary | ICD-10-CM | POA: Insufficient documentation

## 2020-03-15 DIAGNOSIS — Z8546 Personal history of malignant neoplasm of prostate: Secondary | ICD-10-CM | POA: Diagnosis not present

## 2020-03-15 DIAGNOSIS — J45909 Unspecified asthma, uncomplicated: Secondary | ICD-10-CM | POA: Diagnosis not present

## 2020-03-15 DIAGNOSIS — I25110 Atherosclerotic heart disease of native coronary artery with unstable angina pectoris: Secondary | ICD-10-CM | POA: Insufficient documentation

## 2020-03-15 DIAGNOSIS — Z8616 Personal history of COVID-19: Secondary | ICD-10-CM | POA: Insufficient documentation

## 2020-03-15 DIAGNOSIS — Z79899 Other long term (current) drug therapy: Secondary | ICD-10-CM | POA: Diagnosis not present

## 2020-03-15 DIAGNOSIS — R55 Syncope and collapse: Secondary | ICD-10-CM | POA: Diagnosis not present

## 2020-03-15 DIAGNOSIS — Z7982 Long term (current) use of aspirin: Secondary | ICD-10-CM | POA: Insufficient documentation

## 2020-03-15 DIAGNOSIS — Z7984 Long term (current) use of oral hypoglycemic drugs: Secondary | ICD-10-CM | POA: Diagnosis not present

## 2020-03-15 DIAGNOSIS — Z87891 Personal history of nicotine dependence: Secondary | ICD-10-CM | POA: Insufficient documentation

## 2020-03-15 DIAGNOSIS — Z951 Presence of aortocoronary bypass graft: Secondary | ICD-10-CM | POA: Insufficient documentation

## 2020-03-15 DIAGNOSIS — Z794 Long term (current) use of insulin: Secondary | ICD-10-CM | POA: Diagnosis not present

## 2020-03-15 LAB — TROPONIN I (HIGH SENSITIVITY): Troponin I (High Sensitivity): 8 ng/L (ref <18)

## 2020-03-15 LAB — COMPREHENSIVE METABOLIC PANEL
ALT: 12 U/L (ref 0–44)
AST: 22 U/L (ref 15–41)
Albumin: 4 g/dL (ref 3.5–5.0)
Alkaline Phosphatase: 53 U/L (ref 38–126)
Anion gap: 10 (ref 5–15)
BUN: 15 mg/dL (ref 8–23)
CO2: 22 mmol/L (ref 22–32)
Calcium: 8.6 mg/dL — ABNORMAL LOW (ref 8.9–10.3)
Chloride: 104 mmol/L (ref 98–111)
Creatinine, Ser: 1.53 mg/dL — ABNORMAL HIGH (ref 0.61–1.24)
GFR, Estimated: 51 mL/min — ABNORMAL LOW (ref >60)
Glucose, Bld: 137 mg/dL — ABNORMAL HIGH (ref 70–99)
Potassium: 4 mmol/L (ref 3.5–5.1)
Sodium: 136 mmol/L (ref 135–145)
Total Bilirubin: 1 mg/dL (ref 0.3–1.2)
Total Protein: 6.7 g/dL (ref 6.5–8.1)

## 2020-03-15 LAB — CBG MONITORING, ED: Glucose-Capillary: 116 mg/dL — ABNORMAL HIGH (ref 70–99)

## 2020-03-15 MED ORDER — SODIUM CHLORIDE 0.9 % IV SOLN
1000.0000 mL | Freq: Once | INTRAVENOUS | Status: AC
  Administered 2020-03-15: 1000 mL via INTRAVENOUS

## 2020-03-15 NOTE — ED Provider Notes (Signed)
[facility] Emergency Department Provider Note   ____________________________________________    I have reviewed the triage vital signs and the nursing notes.   HISTORY  Chief Complaint Loss of consciousness    HPI [name] is a 60 y.o. male with history as listed below who presents after a syncopal episode.  Patient reports that he came to on the floor.  He notes this is happened to him before and he has been diagnosed with orthostatic syncope.  Review of records and straight the patient was seen in the fall multiple times for similar situation.  He reports he has been doing okay over the last 1 to 2 months.  He thinks this may be related to his medications but he is not certain.  He feels fine now and has no complaints.  Denies palpitations chest pain or shortness of breath.  Normal stools.  No nausea or vomiting.  No abdominal pain.  He reports he typically requires IV fluids  Past Medical History:  Diagnosis Date  . Asthma   . Cancer [facility])    prostate  . Chronic back pain   . Chronic neck pain   . Cirrhosis of liver ([place])   . Complicated migraine 4/81/8563  . Coronary artery disease   . Depression   . Diabetes mellitus without complication ([place])   . Gallstones   . Headache(784.0)   . Hepatitis C   . Hypercholesteremia   . Hypertension   . Left leg paresthesias   . Myocardial infarction ([place])   . Stroke [facility])    TIA  . Vertigo     Patient Active Problem List   Diagnosis Date Noted  . COVID-19 virus infection 04/03/2019  . Sepsis ([place])   . Unstable angina ([place]) 04/28/2017  . Encounter for screening for upper gastrointestinal disorder   . Columnar epithelial-lined lower esophagus   . Secondary esophageal varices without bleeding ([place])   . Encounter for screening colonoscopy   . Rectum inflammation   . Hematochezia   . Syncope, near 01/24/2017  . Hypotension 10/11/2012  . CAD (coronary artery disease) 10/11/2012  . S/P  CABG (coronary artery bypass graft) 10/11/2012  . Cervical spondylosis 09/22/2011  . Neck pain 09/22/2011  . Syncope 06/20/2011  . ARF (acute renal failure) ([place]) 06/20/2011  . Complicated migraine 14/97/0263  . Hypertension   . Hepatitis C   . Cirrhosis of liver ([place])   . Depression   . Chronic back pain   . Left leg paresthesias   . Hypercholesteremia   . Chronic neck pain   . ZCHYIFOY(774.1)     Past Surgical History:  Procedure Laterality Date  . BYPASS GRAFT     triple bypass surgery  . CARDIAC SURGERY    . COLONOSCOPY WITH PROPOFOL N/A 02/15/2017   Procedure: COLONOSCOPY WITH PROPOFOL;  Surgeon: Virgel Manifold, MD;  Location: [place];  Service: Endoscopy;  Laterality: N/A;  . CORONARY ARTERY BYPASS GRAFT    . ESOPHAGOGASTRODUODENOSCOPY (EGD) WITH PROPOFOL N/A 02/15/2017   Procedure: ESOPHAGOGASTRODUODENOSCOPY (EGD) WITH PROPOFOL;  Surgeon: Virgel Manifold, MD;  Location: [place];  Service: Endoscopy;  Laterality: N/A;  . HAND SURGERY  1984  . LEFT HEART CATH AND CORONARY ANGIOGRAPHY Left 04/29/2017   Procedure: LEFT HEART CATH AND CORONARY ANGIOGRAPHY;  Surgeon: Dionisio David, MD;  Location: [place] CV [facility];  Service: Cardiovascular;  Laterality: Left;  . LEFT HEART CATH AND CORONARY ANGIOGRAPHY Left 01/08/2019  Procedure: LEFT HEART CATH AND CORONARY ANGIOGRAPHY;  Surgeon: Dionisio David, MD;  Location: [place] CV LAB;  Service: Cardiovascular;  Laterality: Left;  . ORTHOPEDIC SURGERY     "on my legs"    Prior to Admission medications   Medication Sig Start Date End Date Taking? Authorizing Provider  albuterol (VENTOLIN HFA) 108 (90 Base) MCG/ACT inhaler Inhale 2 puffs into the lungs every 6 (six) hours as needed for wheezing or shortness of breath. 04/05/19   Manuella Ghazi, Pratik D, DO  aspirin EC 81 MG tablet Take 81 mg by mouth daily.    [provider]  Calcium Carbonate-Vitamin D (CALCIUM 600+D) 600-400 MG-UNIT tablet  Take 1 tablet by mouth 2 (two) times daily.     [provider]  cetirizine (ZYRTEC) 10 MG tablet Take 10 mg by mouth 2 (two) times daily.     [provider]  finasteride (PROSCAR) 5 MG tablet Take 5 mg by mouth daily.    [provider]  fluticasone (FLONASE) 50 MCG/ACT nasal spray Place 2 sprays into both nostrils daily as needed for allergies or rhinitis.     [provider]  gabapentin (NEURONTIN) 300 MG capsule Take 900 mg by mouth 3 (three) times daily.  Patient not taking: Reported on 12/19/2019    [provider]  guaiFENesin-dextromethorphan (ROBITUSSIN DM) 100-10 MG/5ML syrup Take 5 mLs by mouth every 4 (four) hours as needed for cough. 04/05/19   Manuella Ghazi, Pratik D, DO  insulin aspart (NOVOLOG) 100 UNIT/ML FlexPen Inject 10 Units into the skin 3 (three) times daily with meals.     [provider]  insulin glargine (LANTUS) 100 UNIT/ML injection Inject 50 Units into the skin at bedtime.     [provider]  isosorbide mononitrate (IMDUR) 30 MG 24 hr tablet Take 30 mg by mouth daily. 12/20/18   [provider]  latanoprost (XALATAN) 0.005 % ophthalmic solution Place 1 drop into both eyes at bedtime.    [provider]  lisinopril (ZESTRIL) 20 MG tablet Take 20 mg by mouth daily.    [provider]  magnesium oxide (MAG-OX) 400 MG tablet Take 400 mg by mouth daily.    [provider]  Melatonin 5 MG TABS Take 5 mg by mouth at bedtime.    [provider]  mesalamine (CANASA) 1000 MG suppository Place 1,000 mg rectally at bedtime. Patient not taking: Reported on 12/19/2019    [provider]  metFORMIN (GLUCOPHAGE) 500 MG tablet Take 1,000 mg by mouth 2 (two) times daily with a meal.     [provider]  metoprolol succinate (TOPROL-XL) 25 MG 24 hr tablet Take 25 mg by mouth daily.    [provider]  mirtazapine (REMERON) 7.5 MG tablet Take 7.5 mg by mouth at  bedtime.    [provider]  naloxone [facility]) nasal spray 4 mg/0.1 mL Place 1 spray into the nose once as needed (opiod overdose).     [provider]  potassium chloride SA (K-DUR,KLOR-CON) 20 MEQ tablet Take 20 mEq by mouth daily.    [provider]  ranolazine (RANEXA) 1000 MG SR tablet Take 1,000 mg by mouth 2 (two) times daily. 08/13/19   [provider]  rosuvastatin (CRESTOR) 20 MG tablet Take 20 mg by mouth daily.     [provider]  terazosin (HYTRIN) 2 MG capsule Take 4 mg by mouth at bedtime.    [provider]  torsemide (DEMADEX) 10 MG  tablet Take 10 mg by mouth daily. 11/28/19   [provider]  trospium (SANCTURA) 20 MG tablet Take 20 mg by mouth 2 (two) times daily.    [provider]  venlafaxine XR (EFFEXOR-XR) 75 MG 24 hr capsule Take 225 mg by mouth daily with breakfast.    [provider]     Allergies Bee venom and Questran [cholestyramine]  Family History  Problem Relation Age of Onset  . Diabetes Brother   . Diabetes Sister   . Diabetes Father   . Hypertension Father   . Diabetes Mother   . Hypertension Mother   . Hypertension Sister   . Hypertension Brother     Social History Social History   Tobacco Use  . Smoking status: Former Smoker    Packs/day: 1.00    Years: 35.00    Pack years: 35.00    Quit date: 01/25/2009    Years since quitting: 11.1  . Smokeless tobacco: Never Used  Vaping Use  . Vaping Use: Never used  Substance Use Topics  . Alcohol use: No    Comment: former alcoholic  . Drug use: No    Comment: former cocaine abuse    Review of Systems  Constitutional: No fever/chills Eyes: No visual changes.  ENT: No sore throat. Cardiovascular: Denies chest pain. Respiratory: Denies shortness of breath. Gastrointestinal: No abdominal pain.  No nausea, no vomiting.   Genitourinary: Negative for dysuria. Musculoskeletal: Negative for back pain. Skin: Negative  for rash. Neurological: Negative for headaches or weakness   ____________________________________________   PHYSICAL EXAM:  VITAL SIGNS: ED Triage Vitals  Enc Vitals Group     BP 03/15/20 1303 129/83     Pulse Rate 03/15/20 1303 68     Resp 03/15/20 1303 18     Temp 03/15/20 1303 97.7 F (36.5 C)     Temp Source 03/15/20 1303 Oral     SpO2 03/15/20 1303 100 %     Weight 03/15/20 1305 96.6 kg (212 lb 15.4 oz)     Height 03/15/20 1305 1.905 m (6\' 3" )     Head Circumference --      Peak Flow --      Pain Score 03/15/20 1304 1     Pain Loc --      Pain Edu? --      Excl. in [place]? --     Constitutional: Alert and oriented. No acute distress. Pleasant and interactive Eyes: Conjunctivae are normal.  Head: Atraumatic. Nose: No congestion/rhinnorhea. Mouth/Throat: Mucous membranes are moist.    Cardiovascular: Normal rate, regular rhythm. Grossly normal heart sounds.  Good peripheral circulation. Respiratory: Normal respiratory effort.  No retractions. Lungs CTAB. Gastrointestinal: Soft and nontender. No distention.  No CVA tenderness.  Musculoskeletal: No lower extremity tenderness nor edema.  Warm and well perfused Neurologic:  Normal speech and language. No gross focal neurologic deficits are appreciated.  Skin:  Skin is warm, dry and intact. No rash noted. Psychiatric: Mood and affect are normal. Speech and behavior are normal.  ____________________________________________   LABS (all labs ordered are listed, but only abnormal results are displayed)  Labs Reviewed  COMPREHENSIVE METABOLIC PANEL - Abnormal; Notable for the following components:      Result Value   Glucose, Bld 137 (*)    Creatinine, Ser 1.53 (*)    Calcium 8.6 (*)    GFR, Estimated 51 (*)    All other components within normal limits  CBG MONITORING, ED - Abnormal; Notable for  the following components:   Glucose-Capillary 116 (*)    All other components within normal limits  TROPONIN I (HIGH  SENSITIVITY)   ____________________________________________  EKG  ED ECG REPORT I, Lavonia Drafts, the attending physician, personally viewed and interpreted this ECG.  Date: 03/15/2020  Rhythm: normal sinus rhythm QRS Axis: normal Intervals: Right bundle branch block, left anterior fascicular block ST/T Wave abnormalities: Nonspecific changes Narrative Interpretation: Compared to prior EKG from November 13, no significant change ____________________________________________  RADIOLOGY  CT head no acute abnormality ____________________________________________   PROCEDURES  Procedure(s) performed: No  Procedures   Critical Care performed: No ____________________________________________   INITIAL IMPRESSION / ASSESSMENT AND PLAN / ED COURSE  Pertinent labs & imaging results that were available during my care of the patient were reviewed by me and considered in my medical decision making (see chart for details).  Patient presents after syncopal episode.  He is well-appearing at this time with reassuring vitals and reassuring exam.  No traumatic injury.  Review of records as noted above demonstrates several episodes of similar presentations to the emergency department  Currently he is feeling well, will treat with IV fluids  High sensitive troponin is unremarkable, CMP is overall unremarkable.  No palpitations chest pain or suggestion of a cardiac cause  We will give IV fluids check orthostatics, if normal appropriate for discharge at that time    ____________________________________________   FINAL CLINICAL IMPRESSION(S) / ED DIAGNOSES  Final diagnoses:  Syncope and collapse        Note:  This document was prepared using Dragon voice recognition software and may include unintentional dictation errors.   Lavonia Drafts, MD 03/15/20 1447

## 2020-03-15 NOTE — ED Notes (Signed)
First Nurse Note: Pt to ED via POV for syncopal episode. Pt is in NAD.

## 2020-03-15 NOTE — ED Triage Notes (Addendum)
Per pt had 1 syncopal episode today . Stated when he woke up he was on the floor. Complains of mild elbow and knee pain ( left ) , associated with some generalized weakness , mild headache. Good equal grip strength , normal neuro assessment   Stated has had prior episodes of the same, stated it was from dehydration. Stated he has taken all his meds.

## 2020-03-15 NOTE — ED Provider Notes (Signed)
-----------------------------------------   4:24 PM on 03/15/2020 -----------------------------------------  Blood pressure 129/83, pulse 68, temperature 97.7 F (36.5 C), temperature source Oral, resp. rate 18, height 6\' 3"  (1.905 m), weight 96.6 kg, SpO2 100 %.  Assuming care from Dr. Corky Downs.  In short, [name] is a 60 y.o. male with a chief complaint of Loss of Consciousness (Per pt had 1 syncopal episode today . Stated when he woke up he was on the floor. Complains of mild elbow and knee pain ( left ) , associated with some generalized weakness ) .  Refer to the original H&P for additional details.  The current plan of care is to reassess and check orthostatics following IVF bolus.  ----------------------------------------- 6:30 PM on 03/15/2020 -----------------------------------------  Patient feels much better after IV fluids, orthostatic vital signs are reassuring and patient with no lightheadedness upon standing.  Given his history of recurrent syncope, he is appropriate for discharge home with PCP and cardiology follow-up.  He was counseled to return to the ED for new worsening symptoms, patient agrees with plan.    Blake Divine, MD 03/15/20 [phone]

## 2020-08-04 ENCOUNTER — Other Ambulatory Visit: Payer: Self-pay

## 2020-08-04 ENCOUNTER — Encounter (HOSPITAL_COMMUNITY): Payer: Self-pay | Admitting: *Deleted

## 2020-08-04 ENCOUNTER — Emergency Department (HOSPITAL_COMMUNITY)
Admission: EM | Admit: 2020-08-04 | Discharge: 2020-08-04 | Disposition: A | Discharge status: Home / Self Care | Payer: No Typology Code available for payment source | Attending: Emergency Medicine | Admitting: Emergency Medicine

## 2020-08-04 DIAGNOSIS — Z7984 Long term (current) use of oral hypoglycemic drugs: Secondary | ICD-10-CM | POA: Diagnosis not present

## 2020-08-04 DIAGNOSIS — I25110 Atherosclerotic heart disease of native coronary artery with unstable angina pectoris: Secondary | ICD-10-CM | POA: Insufficient documentation

## 2020-08-04 DIAGNOSIS — Z7982 Long term (current) use of aspirin: Secondary | ICD-10-CM | POA: Insufficient documentation

## 2020-08-04 DIAGNOSIS — J45909 Unspecified asthma, uncomplicated: Secondary | ICD-10-CM | POA: Diagnosis not present

## 2020-08-04 DIAGNOSIS — N289 Disorder of kidney and ureter, unspecified: Secondary | ICD-10-CM | POA: Insufficient documentation

## 2020-08-04 DIAGNOSIS — Z951 Presence of aortocoronary bypass graft: Secondary | ICD-10-CM | POA: Insufficient documentation

## 2020-08-04 DIAGNOSIS — E1165 Type 2 diabetes mellitus with hyperglycemia: Secondary | ICD-10-CM | POA: Insufficient documentation

## 2020-08-04 DIAGNOSIS — Z79899 Other long term (current) drug therapy: Secondary | ICD-10-CM | POA: Insufficient documentation

## 2020-08-04 DIAGNOSIS — Z87891 Personal history of nicotine dependence: Secondary | ICD-10-CM | POA: Insufficient documentation

## 2020-08-04 DIAGNOSIS — R739 Hyperglycemia, unspecified: Secondary | ICD-10-CM

## 2020-08-04 DIAGNOSIS — E860 Dehydration: Secondary | ICD-10-CM | POA: Diagnosis not present

## 2020-08-04 DIAGNOSIS — Z8546 Personal history of malignant neoplasm of prostate: Secondary | ICD-10-CM | POA: Diagnosis not present

## 2020-08-04 DIAGNOSIS — Z8616 Personal history of COVID-19: Secondary | ICD-10-CM | POA: Insufficient documentation

## 2020-08-04 DIAGNOSIS — I10 Essential (primary) hypertension: Secondary | ICD-10-CM | POA: Diagnosis not present

## 2020-08-04 DIAGNOSIS — E119 Type 2 diabetes mellitus without complications: Secondary | ICD-10-CM | POA: Insufficient documentation

## 2020-08-04 DIAGNOSIS — Z794 Long term (current) use of insulin: Secondary | ICD-10-CM | POA: Insufficient documentation

## 2020-08-04 DIAGNOSIS — R55 Syncope and collapse: Secondary | ICD-10-CM | POA: Insufficient documentation

## 2020-08-04 LAB — BASIC METABOLIC PANEL
Anion gap: 12 (ref 5–15)
BUN: 23 mg/dL (ref 8–23)
CO2: 24 mmol/L (ref 22–32)
Calcium: 9.4 mg/dL (ref 8.9–10.3)
Chloride: 100 mmol/L (ref 98–111)
Creatinine, Ser: 2.31 mg/dL — ABNORMAL HIGH (ref 0.61–1.24)
GFR, Estimated: 31 mL/min — ABNORMAL LOW (ref >60)
Glucose, Bld: 156 mg/dL — ABNORMAL HIGH (ref 70–99)
Potassium: 3.5 mmol/L (ref 3.5–5.1)
Sodium: 136 mmol/L (ref 135–145)

## 2020-08-04 LAB — URINALYSIS, ROUTINE W REFLEX MICROSCOPIC
Bilirubin Urine: NEGATIVE
Glucose, UA: NEGATIVE mg/dL
Hgb urine dipstick: NEGATIVE
Ketones, ur: NEGATIVE mg/dL
Leukocytes,Ua: NEGATIVE
Nitrite: NEGATIVE
Protein, ur: NEGATIVE mg/dL
Specific Gravity, Urine: 1.013 (ref 1.005–1.030)
pH: 7 (ref 5.0–8.0)

## 2020-08-04 LAB — CBC
HCT: 43.9 % (ref 39.0–52.0)
Hemoglobin: 14.6 g/dL (ref 13.0–17.0)
MCH: 29.1 pg (ref 26.0–34.0)
MCHC: 33.3 g/dL (ref 30.0–36.0)
MCV: 87.6 fL (ref 80.0–100.0)
Platelets: 235 10*3/uL (ref 150–400)
RBC: 5.01 MIL/uL (ref 4.22–5.81)
RDW: 13.2 % (ref 11.5–15.5)
WBC: 8.3 10*3/uL (ref 4.0–10.5)
nRBC: 0 % (ref 0.0–0.2)

## 2020-08-04 LAB — CBG MONITORING, ED: Glucose-Capillary: 133 mg/dL — ABNORMAL HIGH (ref 70–99)

## 2020-08-04 MED ORDER — SODIUM CHLORIDE 0.9 % IV BOLUS
1000.0000 mL | Freq: Once | INTRAVENOUS | Status: AC
Start: 2020-08-04 — End: 2020-08-04
  Administered 2020-08-04: 1000 mL via INTRAVENOUS

## 2020-08-04 NOTE — ED Provider Notes (Signed)
[facility] EMERGENCY DEPARTMENT Provider Note   CSN: [id number] Arrival date & time: 08/04/20  1525     History Chief Complaint  Patient presents with   Loss of Consciousness    [name] is a 60 y.o. male. Patient seen by me 3:30 PM. HPI Presents for evaluation of near syncope for about a week, on and off with episode of syncope today.  He states when he stands up he feels dizzy.  He is taking his regular medicines but states he is urinating a lot.  He denies dysuria, or hematuria.  He is not having vomiting or diarrhea.  He has previously had a similar problem in the past.  There are no other known active modifying factors.    Past Medical History:  Diagnosis Date   Asthma    Cancer [facility])    prostate   Chronic back pain    Chronic neck pain    Cirrhosis of liver ([place])    Complicated migraine 8/58/8502   Coronary artery disease    Depression    Diabetes mellitus without complication ([place])    Gallstones    Headache(784.0)    Hepatitis C    Hypercholesteremia    Hypertension    Left leg paresthesias    Myocardial infarction 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   Cervical spondylosis 09/22/2011   Neck pain 09/22/2011   Syncope 06/20/2011   ARF (acute renal failure) ([place]) 77/41/2878   Complicated migraine 67/67/2094   Hypertension    Hepatitis C    Cirrhosis of liver ([facility])    Depression    Chronic back pain    Left  leg paresthesias    Hypercholesteremia    Chronic neck pain    Headache(784.0)     Past Surgical History:  Procedure Laterality Date   BYPASS GRAFT     triple bypass surgery   CARDIAC SURGERY     COLONOSCOPY WITH PROPOFOL N/A 02/15/2017   Procedure: COLONOSCOPY WITH PROPOFOL;  Surgeon: Virgel Manifold, MD;  Location: [place];  Service: Endoscopy;  Laterality: N/A;   CORONARY ARTERY BYPASS GRAFT     ESOPHAGOGASTRODUODENOSCOPY (EGD) WITH PROPOFOL N/A 02/15/2017   Procedure: ESOPHAGOGASTRODUODENOSCOPY (EGD) WITH PROPOFOL;  Surgeon: Virgel Manifold, MD;  Location: [place];  Service: Endoscopy;  Laterality: N/A;   HAND SURGERY  1984   LEFT HEART CATH AND CORONARY ANGIOGRAPHY Left 04/29/2017   Procedure: LEFT HEART CATH AND CORONARY ANGIOGRAPHY;  Surgeon: Dionisio David, MD;  Location: [place] [facility];  Service: Cardiovascular;  Laterality: Left;   LEFT HEART CATH AND CORONARY ANGIOGRAPHY Left 01/08/2019   Procedure: LEFT HEART CATH AND CORONARY ANGIOGRAPHY;  Surgeon: Dionisio David, MD;  Location: [place] [facility];  Service: Cardiovascular;  Laterality: Left;   ORTHOPEDIC SURGERY     "on my legs"       Family  History  Problem Relation Age of Onset   Diabetes Brother    Diabetes Sister    Diabetes Father    Hypertension Father    Diabetes Mother    Hypertension Mother    Hypertension Sister    Hypertension Brother     Social History   Tobacco Use   Smoking status: Former    Packs/day: 1.00    Years: 35.00    Pack years: 35.00    Types: Cigarettes    Quit date: 01/25/2009    Years since quitting: 11.5   Smokeless tobacco: Never  Vaping Use   Vaping Use: Never used  Substance Use Topics   Alcohol use: No    Comment: former alcoholic   Drug use: No    Comment: former cocaine abuse    Home Medications Prior to Admission medications   Medication Sig Start Date End Date Taking? Authorizing Provider  albuterol (VENTOLIN HFA) 108  (90 Base) MCG/ACT inhaler Inhale 2 puffs into the lungs every 6 (six) hours as needed for wheezing or shortness of breath. 04/05/19  Yes Manuella Ghazi, Pratik D, DO  aspirin EC 81 MG tablet Take 81 mg by mouth daily.   Yes [provider]  Calcium Carbonate-Vitamin D 600-400 MG-UNIT tablet Take 1 tablet by mouth 2 (two) times daily.    Yes [provider]  cetirizine (ZYRTEC) 10 MG tablet Take 10 mg by mouth 2 (two) times daily.    Yes [provider]  finasteride (PROSCAR) 5 MG tablet Take 5 mg by mouth daily.   Yes [provider]  finasteride (PROSCAR) 5 MG tablet Take 1 tablet by mouth daily. 03/31/20  Yes [provider]  furosemide (LASIX) 40 MG tablet Take 40 mg by mouth daily. 06/11/20  Yes [provider]  insulin glargine (LANTUS) 100 UNIT/ML injection Inject 50 Units into the skin at bedtime.    Yes [provider]  latanoprost (XALATAN) 0.005 % ophthalmic solution Place 1 drop into both eyes at bedtime.   Yes [provider]  lisinopril (ZESTRIL) 20 MG tablet Take 20 mg by mouth daily.   Yes [provider]  magnesium oxide (MAG-OX) 400 MG tablet Take 400 mg by mouth 2 (two) times daily.   Yes [provider]  metFORMIN (GLUCOPHAGE) 1000 MG tablet Take 1,000 mg by mouth 2 (two) times daily with a meal. 03/31/20  Yes [provider]  metoprolol succinate (TOPROL-XL) 25 MG 24 hr tablet Take 25 mg by mouth daily.   Yes [provider]  mirabegron ER (MYRBETRIQ) 25 MG TB24 tablet Take 25 mg by mouth daily. 06/11/20  Yes [provider]  mirtazapine (REMERON) 7.5 MG tablet Take 7.5 mg by mouth at bedtime.   Yes [provider]  naloxone (NARCAN) nasal spray 4 mg/0.1 mL Place 1 spray into the nose once as needed (opiod overdose).    Yes [provider]  oxybutynin (DITROPAN-XL) 5 MG 24 hr tablet Take 5 mg by mouth daily. 03/11/20  Yes [provider]  pantoprazole  (PROTONIX) 40 MG tablet Take 1 tablet by mouth daily. 07/11/19  Yes [provider]  potassium chloride SA (K-DUR,KLOR-CON) 20 MEQ tablet Take 20 mEq by mouth daily.   Yes [provider]  ranolazine (RANEXA) 1000 MG SR tablet Take 1,000 mg by mouth 2 (two) times daily. 08/13/19  Yes [provider]  rosuvastatin (CRESTOR) 20 MG tablet Take 20 mg by mouth daily.    Yes [provider]  tamsulosin (FLOMAX) 0.4 MG CAPS capsule Take 1 capsule by mouth in the morning and at bedtime. 02/22/20  Yes [provider]  terazosin (HYTRIN) 2 MG capsule Take 4 mg by mouth at bedtime.   Yes [provider]  trospium (SANCTURA) 20 MG tablet Take 20 mg by mouth 2 (two) times daily.   Yes [provider]  venlafaxine XR (EFFEXOR-XR) 75 MG 24 hr capsule Take 225 mg by mouth daily with breakfast.   Yes [provider]  fluticasone (FLONASE) 50 MCG/ACT nasal spray Place 2 sprays into both nostrils daily as needed for allergies or rhinitis.  Patient not taking: Reported on 08/04/2020    [provider]  guaiFENesin-dextromethorphan (ROBITUSSIN DM) 100-10 MG/5ML syrup Take 5 mLs by mouth every 4 (four) hours as needed for cough. Patient not taking: Reported on 08/04/2020 04/05/19   Heath Lark D, DO  Melatonin 5 MG TABS Take 5 mg by mouth at bedtime. Patient not taking: Reported on 08/04/2020    [provider]  metFORMIN (GLUCOPHAGE) 500 MG tablet Take 1,000 mg by mouth 2 (two) times daily with a meal.  Patient not taking: Reported on 08/04/2020    [provider]  torsemide (DEMADEX) 10 MG tablet Take 10 mg by mouth daily. Patient not taking: Reported on 08/04/2020 11/28/19   [provider]    Allergies    Bee venom and Questran [cholestyramine]  Review of Systems   Review of Systems  All other systems reviewed and are negative.  Physical Exam Updated Vital Signs BP 132/80   Pulse 64   Temp 97.6 F (36.4  C)   Resp 17   Ht 6\' 3"  (1.905 m)   Wt 111.6 kg   SpO2 100%   BMI 30.75 kg/m   Physical Exam Vitals and nursing note reviewed.  Constitutional:      General: He is not in acute distress.    Appearance: He is well-developed. He is not toxic-appearing or diaphoretic.  HENT:     Head: Normocephalic and atraumatic.     Right Ear: External ear normal.     Left Ear: External ear normal.  Eyes:     Conjunctiva/sclera: Conjunctivae normal.     Pupils: Pupils are equal, round, and reactive to light.  Neck:     Trachea: Phonation normal.  Cardiovascular:     Rate and Rhythm: Normal rate and regular rhythm.     Heart sounds: Normal heart sounds.     Comments: Hypotensive on arrival Pulmonary:     Effort: Pulmonary effort is normal. No respiratory distress.     Breath sounds: Normal breath sounds. No stridor.  Abdominal:     General: There is no distension.     Palpations: Abdomen is soft.     Tenderness: There is no abdominal tenderness.  Musculoskeletal:        General: Normal range of motion.     Cervical back: Normal range of motion and neck supple.  Skin:    General: Skin is warm and dry.  Neurological:     Mental Status: He is alert and oriented to person, place, and time.     Cranial Nerves: No cranial nerve deficit.     Sensory: No sensory deficit.     Motor: No abnormal muscle tone.     Coordination: Coordination normal.  Psychiatric:        Mood and Affect: Mood normal.        Behavior: Behavior normal.  Thought Content: Thought content normal.        Judgment: Judgment normal.    ED Results / Procedures / Treatments   Labs (all labs ordered are listed, but only abnormal results are displayed) Labs Reviewed  BASIC METABOLIC PANEL - Abnormal; Notable for the following components:      Result Value   Glucose, Bld 156 (*)    Creatinine, Ser 2.31 (*)    GFR, Estimated 31 (*)    All other components within normal limits  CBG MONITORING, ED - Abnormal;  Notable for the following components:   Glucose-Capillary 133 (*)    All other components within normal limits  CBC  URINALYSIS, ROUTINE W REFLEX MICROSCOPIC    EKG EKG Interpretation  Date/Time:  Monday August 04 2020 15:40:14 EDT Ventricular Rate:  78 PR Interval:  171 QRS Duration: 169 QT Interval:  461 QTC Calculation: 526 R Axis:   106 Text Interpretation: Sinus rhythm RBBB and LPFB Similar to Feb 2022 and Nov 2021 tracings Confirmed by Nanda Quinton [phone]) on 08/04/2020 3:44:36 PM  Radiology No results found.  Procedures Procedures   Medications Ordered in ED Medications  sodium chloride 0.9 % bolus 1,000 mL (0 mLs Intravenous Stopped 08/04/20 1816)    ED Course  I have reviewed the triage vital signs and the nursing notes.  Pertinent labs & imaging results that were available during my care of the patient were reviewed by me and considered in my medical decision making (see chart for details).    MDM Rules/Calculators/A&P                           Patient Vitals for the past 24 hrs:  BP Temp Pulse Resp SpO2 Height Weight  08/04/20 2000 132/80 -- 64 17 100 % -- --  08/04/20 1930 127/79 -- 62 15 99 % -- --  08/04/20 1925 127/74 -- 62 14 99 % -- --  08/04/20 1922 -- -- 66 14 100 % -- --  08/04/20 1900 116/70 -- 66 18 99 % -- --  08/04/20 1817 110/73 -- 76 18 99 % -- --  08/04/20 1722 -- -- -- -- -- 6\' 3"  (1.905 m) 111.6 kg  08/04/20 1700 97/67 -- 72 15 95 % -- --  08/04/20 1630 95/72 -- 75 (!) 21 95 % -- --  08/04/20 1600 101/62 -- 77 18 96 % -- --  08/04/20 1533 (!) 90/59 97.6 F (36.4 C) 84 20 -- -- --    8:41 PM Reevaluation with update and discussion. After initial assessment and treatment, an updated evaluation reveals he is comfortable now and states he has had this happen before requiring fluids to treat his near syncope.  Findings discussed and questions answered. Daleen Bo   Medical Decision Making:  This patient is presenting for evaluation of  near syncope and syncope, which does require a range of treatment options, and is a complaint that involves a high risk of morbidity and mortality. The differential diagnoses include acute infectious process, metabolic disorder, volume depletion, cardiac disorder. I decided to review old records, and in summary Ehly male presenting for evaluation of syncope with history of coronary disease, liver cirrhosis, migraines, hematochezia, and sepsis.  I did not require additional historical information from anyone.  Clinical Laboratory Tests Ordered, included CBC, Metabolic panel, and Urinalysis. Review indicates normal except glucose high, creatinine high, GFR low.  Orthostatic blood pressure and pulse-initially abnormal, repeated and normal.  Critical Interventions-clinical evaluation, laboratory testing, IV fluids, observation reassessment  After These Interventions, the Patient was reevaluated and was found stable for discharge.  Patient with signs symptoms of dehydration including elevated creatinine.  He has baseline renal insufficiency.  Doubt cardiac instability, cardiac arrhythmia, acute infectious process or significant metabolic disorder.  He is stable for discharge with outpatient management.  He will push fluids at home, follow-up with his PCP next week for checkup.  CRITICAL CARE-no Performed by: Daleen Bo  Nursing Notes Reviewed/ Care Coordinated Applicable Imaging Reviewed Interpretation of Laboratory Data incorporated into ED treatment  The patient appears reasonably screened and/or stabilized for discharge and I doubt any other medical condition or other [facility] requiring further screening, evaluation, or treatment in the ED at this time prior to discharge.  Plan: Home Medications-continue usual medication; Home Treatments-drink 1 to 2 L of water every day; return here if the recommended treatment, does not improve the symptoms; Recommended follow up-PCP, follow-up next week and as  needed     Final Clinical Impression(s) / ED Diagnoses Final diagnoses:  Dehydration  Renal insufficiency  Hyperglycemia    Rx / DC Orders ED Discharge Orders     None        Daleen Bo, MD 08/05/20 0003

## 2020-08-04 NOTE — Discharge Instructions (Addendum)
It appears that you passed out today because you were dehydrated.  Try to drink 1 to 2 L of water every day to improve your hydration.  Your kidney function is somewhat worse than previously but should get better with drinking the fluid.  Make sure you see your doctor next week for repeat kidney function testing.  Continue taking all of your regular medicines and try to watch your carbohydrate intake.  Return here, if needed.

## 2020-08-04 NOTE — ED Triage Notes (Signed)
Syncopal episode prior to arrival. History of same

## 2020-08-05 ENCOUNTER — Encounter: Payer: Self-pay | Admitting: Emergency Medicine

## 2020-08-05 ENCOUNTER — Inpatient Hospital Stay
Admission: EM | Admit: 2020-08-05 | Discharge: 2020-08-08 | DRG: 312 | Disposition: A | Discharge status: Home / Self Care | Payer: No Typology Code available for payment source | Attending: Internal Medicine | Admitting: Internal Medicine

## 2020-08-05 DIAGNOSIS — Z8616 Personal history of COVID-19: Secondary | ICD-10-CM | POA: Diagnosis not present

## 2020-08-05 DIAGNOSIS — N1831 Chronic kidney disease, stage 3a: Secondary | ICD-10-CM | POA: Diagnosis present

## 2020-08-05 DIAGNOSIS — Z7984 Long term (current) use of oral hypoglycemic drugs: Secondary | ICD-10-CM

## 2020-08-05 DIAGNOSIS — Z951 Presence of aortocoronary bypass graft: Secondary | ICD-10-CM | POA: Diagnosis not present

## 2020-08-05 DIAGNOSIS — Z7982 Long term (current) use of aspirin: Secondary | ICD-10-CM | POA: Diagnosis not present

## 2020-08-05 DIAGNOSIS — Z833 Family history of diabetes mellitus: Secondary | ICD-10-CM | POA: Diagnosis not present

## 2020-08-05 DIAGNOSIS — Z87891 Personal history of nicotine dependence: Secondary | ICD-10-CM

## 2020-08-05 DIAGNOSIS — I130 Hypertensive heart and chronic kidney disease with heart failure and stage 1 through stage 4 chronic kidney disease, or unspecified chronic kidney disease: Secondary | ICD-10-CM | POA: Diagnosis present

## 2020-08-05 DIAGNOSIS — Z8673 Personal history of transient ischemic attack (TIA), and cerebral infarction without residual deficits: Secondary | ICD-10-CM | POA: Diagnosis not present

## 2020-08-05 DIAGNOSIS — Z794 Long term (current) use of insulin: Secondary | ICD-10-CM

## 2020-08-05 DIAGNOSIS — Z8546 Personal history of malignant neoplasm of prostate: Secondary | ICD-10-CM

## 2020-08-05 DIAGNOSIS — N400 Enlarged prostate without lower urinary tract symptoms: Secondary | ICD-10-CM | POA: Diagnosis present

## 2020-08-05 DIAGNOSIS — K760 Fatty (change of) liver, not elsewhere classified: Secondary | ICD-10-CM | POA: Diagnosis present

## 2020-08-05 DIAGNOSIS — F32A Depression, unspecified: Secondary | ICD-10-CM | POA: Diagnosis present

## 2020-08-05 DIAGNOSIS — Z8249 Family history of ischemic heart disease and other diseases of the circulatory system: Secondary | ICD-10-CM | POA: Diagnosis not present

## 2020-08-05 DIAGNOSIS — E785 Hyperlipidemia, unspecified: Secondary | ICD-10-CM | POA: Insufficient documentation

## 2020-08-05 DIAGNOSIS — Z79899 Other long term (current) drug therapy: Secondary | ICD-10-CM

## 2020-08-05 DIAGNOSIS — E1169 Type 2 diabetes mellitus with other specified complication: Secondary | ICD-10-CM | POA: Diagnosis present

## 2020-08-05 DIAGNOSIS — I5032 Chronic diastolic (congestive) heart failure: Secondary | ICD-10-CM | POA: Diagnosis present

## 2020-08-05 DIAGNOSIS — I252 Old myocardial infarction: Secondary | ICD-10-CM | POA: Diagnosis not present

## 2020-08-05 DIAGNOSIS — E1122 Type 2 diabetes mellitus with diabetic chronic kidney disease: Secondary | ICD-10-CM | POA: Diagnosis present

## 2020-08-05 DIAGNOSIS — E7800 Pure hypercholesterolemia, unspecified: Secondary | ICD-10-CM | POA: Diagnosis present

## 2020-08-05 DIAGNOSIS — I951 Orthostatic hypotension: Secondary | ICD-10-CM | POA: Diagnosis present

## 2020-08-05 DIAGNOSIS — J45909 Unspecified asthma, uncomplicated: Secondary | ICD-10-CM | POA: Diagnosis present

## 2020-08-05 DIAGNOSIS — R197 Diarrhea, unspecified: Secondary | ICD-10-CM | POA: Diagnosis present

## 2020-08-05 DIAGNOSIS — E860 Dehydration: Secondary | ICD-10-CM | POA: Diagnosis present

## 2020-08-05 DIAGNOSIS — I2510 Atherosclerotic heart disease of native coronary artery without angina pectoris: Secondary | ICD-10-CM | POA: Diagnosis present

## 2020-08-05 DIAGNOSIS — K7460 Unspecified cirrhosis of liver: Secondary | ICD-10-CM | POA: Diagnosis present

## 2020-08-05 LAB — COMPREHENSIVE METABOLIC PANEL
ALT: 14 U/L (ref 0–44)
AST: 23 U/L (ref 15–41)
Albumin: 3.9 g/dL (ref 3.5–5.0)
Alkaline Phosphatase: 31 U/L — ABNORMAL LOW (ref 38–126)
Anion gap: 8 (ref 5–15)
BUN: 25 mg/dL — ABNORMAL HIGH (ref 8–23)
CO2: 24 mmol/L (ref 22–32)
Calcium: 9.1 mg/dL (ref 8.9–10.3)
Chloride: 107 mmol/L (ref 98–111)
Creatinine, Ser: 2.14 mg/dL — ABNORMAL HIGH (ref 0.61–1.24)
GFR, Estimated: 34 mL/min — ABNORMAL LOW (ref >60)
Glucose, Bld: 131 mg/dL — ABNORMAL HIGH (ref 70–99)
Potassium: 3.6 mmol/L (ref 3.5–5.1)
Sodium: 139 mmol/L (ref 135–145)
Total Bilirubin: 1.4 mg/dL — ABNORMAL HIGH (ref 0.3–1.2)
Total Protein: 6.3 g/dL — ABNORMAL LOW (ref 6.5–8.1)

## 2020-08-05 LAB — CBC
HCT: 36.7 % — ABNORMAL LOW (ref 39.0–52.0)
Hemoglobin: 12.6 g/dL — ABNORMAL LOW (ref 13.0–17.0)
MCH: 29.8 pg (ref 26.0–34.0)
MCHC: 34.3 g/dL (ref 30.0–36.0)
MCV: 86.8 fL (ref 80.0–100.0)
Platelets: 180 10*3/uL (ref 150–400)
RBC: 4.23 MIL/uL (ref 4.22–5.81)
RDW: 13.2 % (ref 11.5–15.5)
WBC: 7.5 10*3/uL (ref 4.0–10.5)
nRBC: 0 % (ref 0.0–0.2)

## 2020-08-05 LAB — CBG MONITORING, ED: Glucose-Capillary: 97 mg/dL (ref 70–99)

## 2020-08-05 LAB — RESP PANEL BY RT-PCR (FLU A&B, COVID) ARPGX2
Influenza A by PCR: NEGATIVE
Influenza B by PCR: NEGATIVE
SARS Coronavirus 2 by RT PCR: NEGATIVE

## 2020-08-05 LAB — TSH: TSH: 4.992 u[IU]/mL — ABNORMAL HIGH (ref 0.350–4.500)

## 2020-08-05 LAB — TROPONIN I (HIGH SENSITIVITY): Troponin I (High Sensitivity): 11 ng/L (ref <18)

## 2020-08-05 LAB — HEMOGLOBIN A1C
Hgb A1c MFr Bld: 5.8 % — ABNORMAL HIGH (ref 4.8–5.6)
Mean Plasma Glucose: 119.76 mg/dL

## 2020-08-05 MED ORDER — ALBUTEROL SULFATE (2.5 MG/3ML) 0.083% IN NEBU
2.5000 mg | INHALATION_SOLUTION | Freq: Four times a day (QID) | RESPIRATORY_TRACT | Status: DC
  Administered 2020-08-05 – 2020-08-07 (×7): 2.5 mg via RESPIRATORY_TRACT
  Filled 2020-08-05 (×7): qty 3

## 2020-08-05 MED ORDER — HEPARIN SODIUM (PORCINE) 5000 UNIT/ML IJ SOLN
5000.0000 [IU] | Freq: Three times a day (TID) | INTRAMUSCULAR | Status: DC
  Administered 2020-08-05 – 2020-08-08 (×8): 5000 [IU] via SUBCUTANEOUS
  Filled 2020-08-05 (×8): qty 1

## 2020-08-05 MED ORDER — ACETAMINOPHEN 650 MG RE SUPP: 650.0000 mg | Freq: Four times a day (QID) | RECTAL | Status: DC | PRN

## 2020-08-05 MED ORDER — VENLAFAXINE HCL ER 75 MG PO CP24
225.0000 mg | ORAL_CAPSULE | Freq: Every day | ORAL | Status: DC
  Administered 2020-08-06 – 2020-08-08 (×3): 225 mg via ORAL
  Filled 2020-08-05: qty 1
  Filled 2020-08-05 (×3): qty 3

## 2020-08-05 MED ORDER — SODIUM CHLORIDE 0.9 % IV SOLN: INTRAVENOUS | Status: DC

## 2020-08-05 MED ORDER — ROSUVASTATIN CALCIUM 10 MG PO TABS
20.0000 mg | ORAL_TABLET | Freq: Every day | ORAL | Status: DC
  Administered 2020-08-06 – 2020-08-08 (×3): 20 mg via ORAL
  Filled 2020-08-05: qty 2
  Filled 2020-08-05: qty 1
  Filled 2020-08-05 (×2): qty 2

## 2020-08-05 MED ORDER — MIRTAZAPINE 15 MG PO TABS
7.5000 mg | ORAL_TABLET | Freq: Every day | ORAL | Status: DC
  Administered 2020-08-05 – 2020-08-07 (×3): 7.5 mg via ORAL
  Filled 2020-08-05 (×3): qty 1

## 2020-08-05 MED ORDER — INSULIN ASPART 100 UNIT/ML IJ SOLN: 0.0000 [IU] | Freq: Every day | INTRAMUSCULAR | Status: DC

## 2020-08-05 MED ORDER — SODIUM CHLORIDE 0.9 % IV SOLN
1000.0000 mL | Freq: Once | INTRAVENOUS | Status: AC
  Administered 2020-08-05: 1000 mL via INTRAVENOUS

## 2020-08-05 MED ORDER — FINASTERIDE 5 MG PO TABS
5.0000 mg | ORAL_TABLET | Freq: Every day | ORAL | Status: DC
  Administered 2020-08-06 – 2020-08-08 (×3): 5 mg via ORAL
  Filled 2020-08-05 (×4): qty 1

## 2020-08-05 MED ORDER — INSULIN GLARGINE 100 UNIT/ML ~~LOC~~ SOLN
30.0000 [IU] | Freq: Every day | SUBCUTANEOUS | Status: DC
  Administered 2020-08-05 – 2020-08-07 (×3): 30 [IU] via SUBCUTANEOUS
  Filled 2020-08-05 (×4): qty 0.3

## 2020-08-05 MED ORDER — PANTOPRAZOLE SODIUM 40 MG PO TBEC
40.0000 mg | DELAYED_RELEASE_TABLET | Freq: Every day | ORAL | Status: DC
  Administered 2020-08-06 – 2020-08-08 (×3): 40 mg via ORAL
  Filled 2020-08-05 (×3): qty 1

## 2020-08-05 MED ORDER — HYDROCODONE-ACETAMINOPHEN 5-325 MG PO TABS
1.0000 | ORAL_TABLET | ORAL | Status: DC | PRN
Start: 2020-08-05 — End: 2020-08-08

## 2020-08-05 MED ORDER — ACETAMINOPHEN 325 MG PO TABS: 650.0000 mg | ORAL_TABLET | Freq: Four times a day (QID) | ORAL | Status: DC | PRN

## 2020-08-05 MED ORDER — HYDRALAZINE HCL 20 MG/ML IJ SOLN: 10.0000 mg | Freq: Four times a day (QID) | INTRAMUSCULAR | Status: DC | PRN

## 2020-08-05 MED ORDER — RANOLAZINE ER 500 MG PO TB12
1000.0000 mg | ORAL_TABLET | Freq: Two times a day (BID) | ORAL | Status: DC
  Administered 2020-08-05 – 2020-08-08 (×6): 1000 mg via ORAL
  Filled 2020-08-05 (×8): qty 2

## 2020-08-05 MED ORDER — ONDANSETRON HCL 4 MG/2ML IJ SOLN: 4.0000 mg | Freq: Four times a day (QID) | INTRAMUSCULAR | Status: DC | PRN

## 2020-08-05 MED ORDER — ZOLPIDEM TARTRATE 5 MG PO TABS: 5.0000 mg | ORAL_TABLET | Freq: Every evening | ORAL | Status: DC | PRN

## 2020-08-05 MED ORDER — INSULIN ASPART 100 UNIT/ML IJ SOLN
0.0000 [IU] | Freq: Three times a day (TID) | INTRAMUSCULAR | Status: DC
  Administered 2020-08-07 (×2): 1 [IU] via SUBCUTANEOUS
  Filled 2020-08-05 (×2): qty 1

## 2020-08-05 MED ORDER — METOPROLOL TARTRATE 25 MG PO TABS
12.5000 mg | ORAL_TABLET | Freq: Two times a day (BID) | ORAL | Status: DC
  Administered 2020-08-06 – 2020-08-07 (×3): 12.5 mg via ORAL
  Filled 2020-08-05 (×5): qty 1

## 2020-08-05 MED ORDER — ASPIRIN EC 81 MG PO TBEC
81.0000 mg | DELAYED_RELEASE_TABLET | Freq: Every day | ORAL | Status: DC
  Administered 2020-08-06 – 2020-08-08 (×3): 81 mg via ORAL
  Filled 2020-08-05 (×3): qty 1

## 2020-08-05 MED ORDER — ONDANSETRON HCL 4 MG PO TABS: 4.0000 mg | ORAL_TABLET | Freq: Four times a day (QID) | ORAL | Status: DC | PRN

## 2020-08-05 NOTE — ED Notes (Signed)
Patient complaining of dizziness/light headedness upon standing. Ellender Hose, MD aware.

## 2020-08-05 NOTE — ED Notes (Signed)
[facility] RN aware of assigned bed

## 2020-08-05 NOTE — ED Notes (Signed)
1L NaCl started at this time per Corky Downs, MD

## 2020-08-05 NOTE — ED Triage Notes (Signed)
Patient arrives via EMS for syncopal episodes. Seen here for same yesterday. Patient 20/54 originally with EMS and 90/61 after 587mL of NaCL. Patient AOx4

## 2020-08-05 NOTE — ED Notes (Signed)
Patient given urinal at this time. 

## 2020-08-05 NOTE — ED Notes (Signed)
Patient complaining of dizziness upon standing. MD aware.

## 2020-08-05 NOTE — ED Notes (Signed)
Patient eating meal tray at this time. No needs expressed.

## 2020-08-05 NOTE — H&P (Signed)
[facility] History and Physical  TYMARION EVERARD GYK:[id number] DOB: [birth year] DOA: 08/05/2020 PCP: [facility] [place]  Admitted from: Home Chief Complaint: Syncope  History of Present Illness: [name] is a 60 y.o. 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 Patient presented to the ED today with multiple syncopal episodes. Patient is a English as a second language teacher, lives at home with family, able to ambulate without assistance.  He follows up with cardiologist, nephrologist and hepatologist as an outpatient. Last few weeks, patient has intermittent dizziness last lightheadedness on standing up.  Lately it has been more often.  He presented to the ED yesterday 7/1, got hydrated and was discharged.  He returned back to ED again today with syncopal episodes.  While standing up, he got lightheaded and fell back, no injury to head, no loss of consciousness.  No chest pain, palpitation, shortness of breath.  Patient is not sure if he has a diagnosis of congestive heart failure but he is on torsemide at home.  In the ED, patient was afebrile, heart rate 73, blood pressure initially was low at 88/68, gradually improved to low 100s with fluid.  He got a total of 3 L normal saline in the ED. Labs with creatinine elevated to 2.31 on 7/11 and 2.14 today 7/12. EKG without ST-T wave changes Hospital service was consulted for inpatient admission and management.  Review of Systems:  All systems were reviewed and were negative unless otherwise mentioned in the HPI   Past medical history: Past Medical History:  Diagnosis Date   Asthma    Cancer [facility])    prostate   Chronic back pain    Chronic neck pain    Cirrhosis of liver ([place])    Complicated migraine 7/93/9030   Coronary artery disease    Depression    Diabetes mellitus without complication (HCC)    Gallstones     Headache(784.0)    Hepatitis C    Hypercholesteremia    Hypertension    Left leg paresthesias    Myocardial infarction ([place])    Stroke ([place])    TIA   Vertigo     Past surgical history: Past Surgical History:  Procedure Laterality Date   BYPASS GRAFT     triple bypass surgery   CARDIAC SURGERY     COLONOSCOPY WITH PROPOFOL N/A 02/15/2017   Procedure: COLONOSCOPY WITH PROPOFOL;  Surgeon: Virgel Manifold, MD;  Location: [place];  Service: Endoscopy;  Laterality: N/A;   CORONARY ARTERY BYPASS GRAFT     ESOPHAGOGASTRODUODENOSCOPY (EGD) WITH PROPOFOL N/A 02/15/2017   Procedure: ESOPHAGOGASTRODUODENOSCOPY (EGD) WITH PROPOFOL;  Surgeon: Virgel Manifold, MD;  Location: [place];  Service: Endoscopy;  Laterality: N/A;   HAND SURGERY  1984   LEFT HEART CATH AND CORONARY ANGIOGRAPHY Left 04/29/2017   Procedure: LEFT HEART CATH AND CORONARY ANGIOGRAPHY;  Surgeon: Dionisio David, MD;  Location: [place] [facility];  Service: Cardiovascular;  Laterality: Left;   LEFT HEART CATH AND CORONARY ANGIOGRAPHY Left 01/08/2019   Procedure: LEFT HEART CATH AND CORONARY ANGIOGRAPHY;  Surgeon: Dionisio David, MD;  Location: [place] [facility];  Service: Cardiovascular;  Laterality: Left;   ORTHOPEDIC SURGERY     "on my legs"    Social History:  reports that he quit smoking about 11 years ago. He has a 35.00 pack-year smoking history. He has never used smokeless tobacco. He reports  that he does not drink alcohol and does not use drugs.  Allergies:  Allergies  Allergen Reactions   Bee Venom Anaphylaxis and Swelling   Questran [Cholestyramine] Other (See Comments)    Reaction unknown    Family history:  Family History  Problem Relation Age of Onset   Diabetes Brother    Diabetes Sister    Diabetes Father    Hypertension Father    Diabetes Mother    Hypertension Mother    Hypertension Sister    Hypertension Brother      Home Meds: Prior to Admission  medications   Medication Sig Start Date End Date Taking? Authorizing Provider  albuterol (VENTOLIN HFA) 108 (90 Base) MCG/ACT inhaler Inhale 2 puffs into the lungs every 6 (six) hours as needed for wheezing or shortness of breath. 04/05/19   Manuella Ghazi, Pratik D, DO  aspirin EC 81 MG tablet Take 81 mg by mouth daily.    [provider]  Calcium Carbonate-Vitamin D 600-400 MG-UNIT tablet Take 1 tablet by mouth 2 (two) times daily.     [provider]  cetirizine (ZYRTEC) 10 MG tablet Take 10 mg by mouth 2 (two) times daily.     [provider]  finasteride (PROSCAR) 5 MG tablet Take 5 mg by mouth daily.    [provider]  finasteride (PROSCAR) 5 MG tablet Take 1 tablet by mouth daily. 03/31/20   [provider]  fluticasone (FLONASE) 50 MCG/ACT nasal spray Place 2 sprays into both nostrils daily as needed for allergies or rhinitis.  Patient not taking: Reported on 08/04/2020    [provider]  furosemide (LASIX) 40 MG tablet Take 40 mg by mouth daily. 06/11/20   [provider]  guaiFENesin-dextromethorphan (ROBITUSSIN DM) 100-10 MG/5ML syrup Take 5 mLs by mouth every 4 (four) hours as needed for cough. Patient not taking: Reported on 08/04/2020 04/05/19   Heath Lark D, DO  insulin glargine (LANTUS) 100 UNIT/ML injection Inject 50 Units into the skin at bedtime.     [provider]  latanoprost (XALATAN) 0.005 % ophthalmic solution Place 1 drop into both eyes at bedtime.    [provider]  lisinopril (ZESTRIL) 20 MG tablet Take 20 mg by mouth daily.    [provider]  magnesium oxide (MAG-OX) 400 MG tablet Take 400 mg by mouth 2 (two) times daily.    [provider]  Melatonin 5 MG TABS Take 5 mg by mouth at bedtime. Patient not taking: Reported on 08/04/2020    [provider]  metFORMIN (GLUCOPHAGE) 1000 MG tablet Take 1,000 mg by mouth 2 (two) times daily with a meal. 03/31/20   [provider]  metFORMIN (GLUCOPHAGE) 500 MG tablet Take 1,000 mg by mouth 2 (two) times daily with a meal.  Patient not taking: Reported on 08/04/2020    [provider]  metoprolol succinate (TOPROL-XL) 25 MG 24 hr tablet Take 25 mg by mouth daily.    [provider]  mirabegron ER (MYRBETRIQ) 25 MG TB24 tablet Take 25 mg by mouth daily. 06/11/20   [provider]  mirtazapine (REMERON) 7.5 MG tablet Take 7.5 mg by mouth at bedtime.    [provider]  naloxone [facility]) nasal spray 4 mg/0.1 mL Place 1 spray into the nose once as needed (opiod overdose).     [provider]  oxybutynin (DITROPAN-XL) 5 MG 24 hr tablet Take 5 mg by mouth daily. 03/11/20   [provider]  pantoprazole (PROTONIX) 40 MG tablet Take 1 tablet by mouth daily. 07/11/19   [provider]  potassium chloride SA (K-DUR,KLOR-CON) 20 MEQ tablet Take 20 mEq by mouth daily.    [provider]  ranolazine (RANEXA) 1000 MG SR tablet Take 1,000 mg by mouth 2 (two) times daily. 08/13/19   [provider]  rosuvastatin (CRESTOR) 20 MG tablet Take 20 mg by mouth daily.     [provider]  tamsulosin (FLOMAX) 0.4 MG CAPS capsule Take 1 capsule by mouth in the morning and at bedtime. 02/22/20   [provider]  terazosin (HYTRIN) 2 MG capsule Take 4 mg by mouth at bedtime.    [provider]  torsemide (DEMADEX) 10 MG tablet Take 10 mg by mouth daily. Patient not taking: Reported on 08/04/2020 11/28/19   [provider]  trospium (SANCTURA) 20 MG tablet Take 20 mg by mouth 2 (two) times daily.    [provider]  venlafaxine XR (EFFEXOR-XR) 75 MG 24 hr capsule Take 225 mg by mouth daily with breakfast.    [provider]    Physical Exam: Vitals:   08/05/20 1530 08/05/20 1602 08/05/20 1630 08/05/20 1715  BP: 107/71 121/82 128/83 135/85  Pulse: 64 (!) 59 62 64  Resp: 17 13 17  (!) 22  Temp:       TempSrc:      SpO2: 98% 100% 98% 97%  Weight:      Height:       Wt Readings from Last 3 Encounters:  08/05/20 108.9 kg  08/04/20 111.6 kg  03/15/20 96.6 kg   Body mass index is 30 kg/m.  General exam: Pleasant, middle-aged African-American male.  Not in distress Skin: No rashes, lesions or ulcers. HEENT: Atraumatic, normocephalic, no obvious bleeding Lungs: Clear to auscultation bilaterally CVS: Regular rate and rhythm, no murmur GI/Abd soft, nontender, nondistended, bowel sound present CNS: Alert, awake, oriented x3 Psychiatry: Mood appropriate Extremities: No pedal edema, no calf tenderness     Consult Orders  (From admission, onward)           Start     Ordered   08/05/20 1711  Consult to hospitalist  Once       Provider:  (Not yet assigned)  Question Answer Comment  Place call to: Hospitalist   Reason for Consult Admit      08/05/20 1710            Labs on Admission:   CBC: Recent Labs  Lab 08/04/20 1527 08/05/20 1154  WBC 8.3 7.5  HGB 14.6 12.6*  HCT 43.9 36.7*  MCV 87.6 86.8  PLT 235 170    Basic Metabolic Panel: Recent Labs  Lab 08/04/20 1527 08/05/20 1154  NA 136 139  K 3.5 3.6  CL 100 107  CO2 24 24  GLUCOSE 156* 131*  BUN 23 25*  CREATININE 2.31* 2.14*  CALCIUM 9.4 9.1    Liver Function Tests: Recent Labs  Lab 08/05/20 1154  AST 23  ALT 14  ALKPHOS 31*  BILITOT 1.4*  PROT 6.3*  ALBUMIN 3.9   No results for input(s): LIPASE, AMYLASE in the last 168 hours. No results for input(s): AMMONIA in the last 168 hours.  Cardiac Enzymes: No results for input(s): CKTOTAL, CKMB, CKMBINDEX, TROPONINI in the last 168 hours.  BNP (last 3 results) No results for input(s): BNP in the last 8760 hours.  ProBNP (last 3 results) No results for input(s): PROBNP in the last 8760 hours.  CBG: Recent Labs  Lab 08/04/20 1601  GLUCAP 133*    Lipase     Component Value Date/Time   LIPASE 34 10/10/2012 2239      Urinalysis    Component Value Date/Time   COLORURINE YELLOW 08/04/2020 1917   APPEARANCEUR CLEAR 08/04/2020 1917   LABSPEC 1.013 08/04/2020 1917   PHURINE 7.0 08/04/2020 1917   GLUCOSEU NEGATIVE 08/04/2020 1917   HGBUR NEGATIVE 08/04/2020 1917   BILIRUBINUR NEGATIVE 08/04/2020 1917   KETONESUR NEGATIVE 08/04/2020 1917   PROTEINUR NEGATIVE 08/04/2020 1917   UROBILINOGEN 0.2 10/11/2012 0443   NITRITE NEGATIVE 08/04/2020 1917   LEUKOCYTESUR NEGATIVE 08/04/2020 1917     Drugs of Abuse     Component Value Date/Time   LABOPIA POSITIVE (A) 10/11/2012 0443   COCAINSCRNUR NONE DETECTED 10/11/2012 0443   LABBENZ NONE DETECTED 10/11/2012 0443   AMPHETMU NONE DETECTED 10/11/2012 0443   THCU NONE DETECTED 10/11/2012 0443   LABBARB NONE DETECTED 10/11/2012 0443      Radiological Exams on Admission: No results found.   ------------------------------------------------------------------------------------------------------ Assessment/Plan: Active Problems:   Orthostatic syncope  Syncope Orthostatic hypotension -Presented with syncope.  Low blood pressure in the ED. Orthostatic hypotension noted as well per ED physician. -Given 2 L of IV fluid in the ED.  We will keep normal saline at 125 ml/h for tonight. -Repeat orthostatic vital signs tomorrow. -Stop diuretics. -Monitor in telemetry.  No definite history of CHF.  Obtain echocardiogram. -Monitor intake and output.  AKI on CKD 3a -Patient has outpatient labs from 3 weeks ago showing a creatinine of 1.4.  His creatinine was elevated up to 2.31 yesterday and at 2.14 today. -Expect improvement with IV fluid.  Diuretics on hold. Recent Labs    09/03/19 1535 09/08/19 2157 12/08/19 1830 12/19/19 1023 03/15/20 1247 08/04/20 1527 08/05/20 1154  BUN 17 15 15 10 15 23  25*  CREATININE 1.58* 1.39* 1.11 1.13 1.53* 2.31* 2.14*   CAD/CABG Hyperlipidemia -Continue aspirin, statin, Ranexa  Essential hypertension -Home meds include  Toprol 25 mg daily, torsemide, lisinopril, terazosin. -Continue metoprolol with metoprolol titrate at 12.5 mg twice daily.  Keep other medicines on hold.  Continue to monitor blood pressure.  Hydralazine as needed.  Type 2 diabetes mellitus -A1c 5.4 in March 2021.  Repeat A1c. -Home meds include metformin 1000 mg twice daily and Lantus 50 units nightly. -We will keep him on lower dose of Lantus 30 units for tonight.  Keep metformin on hold.  Start on sliding scale insulin with Accu-Cheks. Recent Labs  Lab 08/04/20 1601  GLUCAP 133*   Fatty liver/?  Liver cirrhosis History of hepatitis C -Recent outpatient ultrasound with no focal lesion in the liver, no ascites. Recent Labs  Lab 08/05/20 1154  AST 23  ALT 14  ALKPHOS 31*  BILITOT 1.4*  PROT 6.3*  ALBUMIN 3.9   BPH -Home med list shows multiple medications including Flomax, finasteride, oxybutynin, Myrbetriq terazosin and trospium.  Unclear if he is taking all these medications.  Depression -Effexor, Remeron  Mobility: Encourage ambulation when orthostatic vital signs improved Code Status:   Code Status: Full Code  DVT prophylaxis: Heparin subcu Antimicrobials: None Fluid: Normal saline at 125 mL/h  Diet:  Diet Order             Diet Carb Modified Fluid consistency: Thin; Room service appropriate? Yes  Diet effective now                   Consultants: None Family Communication:  None at bedside    Dispo: The patient is from: Home              Anticipated d/c is to: Home in 2 to 3 days               ------------------------------------------------------------------------------------- Severity of Illness: The appropriate patient status for this patient is INPATIENT. Inpatient status is judged to be reasonable and necessary in order to provide the required intensity of service to ensure the patient's safety. The patient's presenting symptoms, physical exam findings, and initial radiographic and laboratory data in  the context of their chronic comorbidities is felt to place them at high risk for further clinical deterioration. Furthermore, it is not anticipated that the patient will be medically stable for discharge from the hospital within 2 midnights of admission. The following factors support the patient status of inpatient.   " The patient's presenting symptoms include dizziness, lightheadedness, syncope. " The worrisome physical exam findings include orthostatic hypotension. " The initial radiographic and laboratory data are worrisome because of elevated creatinine. " The chronic co-morbidities include diabetes, CKD.   * I certify that at the point of admission it is my clinical judgment that the patient will require inpatient hospital care spanning beyond 2 midnights from the point of admission due to high intensity of service, high risk for further deterioration and high frequency of surveillance required.*  -------------------------------------------------------------------------------------  Cline Cools, MD [facility] 08/05/2020

## 2020-08-05 NOTE — ED Provider Notes (Signed)
[facility] Emergency Department Provider Note   ____________________________________________    I have reviewed the triage vital signs and the nursing notes.   HISTORY  Chief Complaint Loss of Consciousness     HPI [name] is a 60 y.o. male with history as noted below who presents with complaints of near syncopal episode, patient reports he became lightheaded and fell back.  Reports very mild injury to the head, no neurodeficits.  No chest pain or palpitations.  Seen at emergency department last night for similar episode, felt much better after IV fluids.  He reports this happens to him fairly frequently and often needs to come in for IV fluids.  He feels that his reason for dizziness currently is that he is having diarrhea over the last week  Past Medical History:  Diagnosis Date   Asthma    Cancer [facility])    prostate   Chronic back pain    Chronic neck pain    Cirrhosis of liver ([place])    Complicated migraine 0/73/7106   Coronary artery disease    Depression    Diabetes mellitus without complication (HCC)    Gallstones    Headache(784.0)    Hepatitis C    Hypercholesteremia    Hypertension    Left leg paresthesias    Myocardial infarction ([place])    Stroke [facility])    TIA   Vertigo     Patient Active Problem List   Diagnosis Date Noted   COVID-19 virus infection 04/03/2019   Sepsis ([place])    Unstable angina ([place]) 04/28/2017   Encounter for screening for upper gastrointestinal disorder    Columnar epithelial-lined lower esophagus    Secondary esophageal varices without bleeding ([place])    Encounter for screening colonoscopy    Rectum inflammation    Hematochezia    Syncope, near 01/24/2017   Hypotension 10/11/2012   CAD (coronary artery disease) 10/11/2012   S/P CABG (coronary artery bypass graft) 10/11/2012   Cervical spondylosis 09/22/2011   Neck pain 09/22/2011   Syncope 06/20/2011   ARF (acute renal failure) ([place])  26/94/8546   Complicated migraine 27/03/5007   Hypertension    Hepatitis C    Cirrhosis of liver (HCC)    Depression    Chronic back pain    Left leg paresthesias    Hypercholesteremia    Chronic neck pain    Headache(784.0)     Past Surgical History:  Procedure Laterality Date   BYPASS GRAFT     triple bypass surgery   CARDIAC SURGERY     COLONOSCOPY WITH PROPOFOL N/A 02/15/2017   Procedure: COLONOSCOPY WITH PROPOFOL;  Surgeon: Virgel Manifold, MD;  Location: [place];  Service: Endoscopy;  Laterality: N/A;   CORONARY ARTERY BYPASS GRAFT     ESOPHAGOGASTRODUODENOSCOPY (EGD) WITH PROPOFOL N/A 02/15/2017   Procedure: ESOPHAGOGASTRODUODENOSCOPY (EGD) WITH PROPOFOL;  Surgeon: Virgel Manifold, MD;  Location: [place];  Service: Endoscopy;  Laterality: N/A;   HAND SURGERY  1984   LEFT HEART CATH AND CORONARY ANGIOGRAPHY Left 04/29/2017   Procedure: LEFT HEART CATH AND CORONARY ANGIOGRAPHY;  Surgeon: Dionisio David, MD;  Location: [place] CV LAB;  Service: Cardiovascular;  Laterality: Left;   LEFT HEART CATH AND CORONARY ANGIOGRAPHY Left 01/08/2019   Procedure: LEFT HEART CATH AND CORONARY ANGIOGRAPHY;  Surgeon: Dionisio David, MD;  Location: [place] CV LAB;  Service: Cardiovascular;  Laterality: Left;   ORTHOPEDIC SURGERY     "  on my legs"    Prior to Admission medications   Medication Sig Start Date End Date Taking? Authorizing Provider  albuterol (VENTOLIN HFA) 108 (90 Base) MCG/ACT inhaler Inhale 2 puffs into the lungs every 6 (six) hours as needed for wheezing or shortness of breath. 04/05/19   Manuella Ghazi, Pratik D, DO  aspirin EC 81 MG tablet Take 81 mg by mouth daily.    [provider]  Calcium Carbonate-Vitamin D 600-400 MG-UNIT tablet Take 1 tablet by mouth 2 (two) times daily.     [provider]  cetirizine (ZYRTEC) 10 MG tablet Take 10 mg by mouth 2 (two) times daily.     [provider]  finasteride (PROSCAR) 5  MG tablet Take 5 mg by mouth daily.    [provider]  finasteride (PROSCAR) 5 MG tablet Take 1 tablet by mouth daily. 03/31/20   [provider]  fluticasone (FLONASE) 50 MCG/ACT nasal spray Place 2 sprays into both nostrils daily as needed for allergies or rhinitis.  Patient not taking: Reported on 08/04/2020    [provider]  furosemide (LASIX) 40 MG tablet Take 40 mg by mouth daily. 06/11/20   [provider]  guaiFENesin-dextromethorphan (ROBITUSSIN DM) 100-10 MG/5ML syrup Take 5 mLs by mouth every 4 (four) hours as needed for cough. Patient not taking: Reported on 08/04/2020 04/05/19   Heath Lark D, DO  insulin glargine (LANTUS) 100 UNIT/ML injection Inject 50 Units into the skin at bedtime.     [provider]  latanoprost (XALATAN) 0.005 % ophthalmic solution Place 1 drop into both eyes at bedtime.    [provider]  lisinopril (ZESTRIL) 20 MG tablet Take 20 mg by mouth daily.    [provider]  magnesium oxide (MAG-OX) 400 MG tablet Take 400 mg by mouth 2 (two) times daily.    [provider]  Melatonin 5 MG TABS Take 5 mg by mouth at bedtime. Patient not taking: Reported on 08/04/2020    [provider]  metFORMIN (GLUCOPHAGE) 1000 MG tablet Take 1,000 mg by mouth 2 (two) times daily with a meal. 03/31/20   [provider]  metFORMIN (GLUCOPHAGE) 500 MG tablet Take 1,000 mg by mouth 2 (two) times daily with a meal.  Patient not taking: Reported on 08/04/2020    [provider]  metoprolol succinate (TOPROL-XL) 25 MG 24 hr tablet Take 25 mg by mouth daily.    [provider]  mirabegron ER (MYRBETRIQ) 25 MG TB24 tablet Take 25 mg by mouth daily. 06/11/20   [provider]  mirtazapine (REMERON) 7.5 MG tablet Take 7.5 mg by mouth at bedtime.    [provider]  naloxone [facility]) nasal spray 4 mg/0.1 mL Place 1 spray into the nose once as needed (opiod overdose).      [provider]  oxybutynin (DITROPAN-XL) 5 MG 24 hr tablet Take 5 mg by mouth daily. 03/11/20   [provider]  pantoprazole (PROTONIX) 40 MG tablet Take 1 tablet by mouth daily. 07/11/19   [provider]  potassium chloride SA (K-DUR,KLOR-CON) 20 MEQ tablet Take 20 mEq by mouth daily.    [provider]  ranolazine (RANEXA) 1000 MG SR tablet Take 1,000 mg by mouth 2 (two) times daily. 08/13/19   [provider]  rosuvastatin (CRESTOR) 20 MG tablet Take 20 mg by mouth daily.     [provider]  tamsulosin (FLOMAX) 0.4 MG CAPS capsule Take 1 capsule by mouth in  the morning and at bedtime. 02/22/20   [provider]  terazosin (HYTRIN) 2 MG capsule Take 4 mg by mouth at bedtime.    [provider]  torsemide (DEMADEX) 10 MG tablet Take 10 mg by mouth daily. Patient not taking: Reported on 08/04/2020 11/28/19   [provider]  trospium (SANCTURA) 20 MG tablet Take 20 mg by mouth 2 (two) times daily.    [provider]  venlafaxine XR (EFFEXOR-XR) 75 MG 24 hr capsule Take 225 mg by mouth daily with breakfast.    [provider]     Allergies Bee venom and Questran [cholestyramine]  Family History  Problem Relation Age of Onset   Diabetes Brother    Diabetes Sister    Diabetes Father    Hypertension Father    Diabetes Mother    Hypertension Mother    Hypertension Sister    Hypertension Brother     Social History Social History   Tobacco Use   Smoking status: Former    Packs/day: 1.00    Years: 35.00    Pack years: 35.00    Types: Cigarettes    Quit date: 01/25/2009    Years since quitting: 11.5   Smokeless tobacco: Never  Vaping Use   Vaping Use: Never used  Substance Use Topics   Alcohol use: No    Comment: former alcoholic   Drug use: No    Comment: former cocaine abuse    Review of Systems  Constitutional: No fever/chills Eyes: No visual changes.  ENT: No sore  throat. Cardiovascular: Denies chest pain. Respiratory: Denies shortness of breath. Gastrointestinal: No abdominal pain.  Diarrhea Genitourinary: Negative for dysuria. Musculoskeletal: Negative for back pain. Skin: Negative for rash. Neurological: Negative for headaches or weakness   ____________________________________________   PHYSICAL EXAM:  VITAL SIGNS: ED Triage Vitals  Enc Vitals Group     BP 08/05/20 1155 (!) 88/68     Pulse Rate 08/05/20 1155 73     Resp 08/05/20 1155 18     Temp 08/05/20 1155 98 F (36.7 C)     Temp Source 08/05/20 1155 Oral     SpO2 08/05/20 1151 97 %     Weight 08/05/20 1155 108.9 kg (240 lb)     Height 08/05/20 1155 1.905 m (6\' 3" )     Head Circumference --      Peak Flow --      Pain Score 08/05/20 1154 6     Pain Loc --      Pain Edu? --      Excl. in [place]? --     Constitutional: Alert and oriented. No acute distress. Pleasant and interactive Eyes: Conjunctivae are normal.  Head: Atraumatic.  No hematoma or bleeding  Neck:  Painless ROM, no vertebral tenderness palpation Cardiovascular: Normal rate, regular rhythm. Grossly normal heart sounds.  Good peripheral circulation. Respiratory: Normal respiratory effort.  No retractions.  Gastrointestinal: Soft and nontender. No distention.  No CVA tenderness. Musculoskeletal: No lower extremity tenderness nor edema.  Warm and well perfused Neurologic:  Normal speech and language. No gross focal neurologic deficits are appreciated.  Skin:  Skin is warm, dry and intact. No rash noted. Psychiatric: Mood and affect are normal. Speech and behavior are normal.  ____________________________________________   LABS (all labs ordered are listed, but only abnormal results are displayed)  Labs Reviewed  CBC - Abnormal; Notable for the following components:      Result Value   Hemoglobin 12.6 (*)  HCT 36.7 (*)    All other components within normal limits  COMPREHENSIVE METABOLIC PANEL - Abnormal;  Notable for the following components:   Glucose, Bld 131 (*)    BUN 25 (*)    Creatinine, Ser 2.14 (*)    Total Protein 6.3 (*)    Alkaline Phosphatase 31 (*)    Total Bilirubin 1.4 (*)    GFR, Estimated 34 (*)    All other components within normal limits  TROPONIN I (HIGH SENSITIVITY)   ____________________________________________  EKG  ED ECG REPORT I, Lavonia Drafts, the attending physician, personally viewed and interpreted this ECG.  Date: 08/05/2020  Rhythm: normal sinus rhythm QRS Axis: normal Intervals: Right bundle branch block ST/T Wave abnormalities: normal Narrative Interpretation: no evidence of acute ischemia  ____________________________________________  RADIOLOGY   ____________________________________________   PROCEDURES  Procedure(s) performed: No  Procedures   Critical Care performed: No ____________________________________________   INITIAL IMPRESSION / ASSESSMENT AND PLAN / ED COURSE  Pertinent labs & imaging results that were available during my care of the patient were reviewed by me and considered in my medical decision making (see chart for details).   Patient presents with dizziness as above, initial low blood pressure, seen for similar episode yesterday, improved greatly with IV fluids.  Will obtain labs, give 2 L IV fluids and reevaluate.  Patient reports some continued dizziness with standing, will give additional liter    ____________________________________________   FINAL CLINICAL IMPRESSION(S) / ED DIAGNOSES  Final diagnoses:  Dehydration        Note:  This document was prepared using Dragon voice recognition software and may include unintentional dictation errors.    Lavonia Drafts, MD 08/05/20 [phone]

## 2020-08-05 NOTE — ED Notes (Signed)
Lindsay RN aware of assigned bed 

## 2020-08-05 NOTE — ED Notes (Signed)
Corky Downs, MD at bedside.

## 2020-08-06 ENCOUNTER — Inpatient Hospital Stay
Admit: 2020-08-06 | Discharge: 2020-08-06 | Disposition: A | Discharge status: Home / Self Care | Payer: No Typology Code available for payment source | Attending: Internal Medicine | Admitting: Internal Medicine

## 2020-08-06 ENCOUNTER — Encounter: Payer: Self-pay | Admitting: Internal Medicine

## 2020-08-06 LAB — BASIC METABOLIC PANEL
Anion gap: 12 (ref 5–15)
BUN: 15 mg/dL (ref 8–23)
CO2: 24 mmol/L (ref 22–32)
Calcium: 9 mg/dL (ref 8.9–10.3)
Chloride: 107 mmol/L (ref 98–111)
Creatinine, Ser: 1.26 mg/dL — ABNORMAL HIGH (ref 0.61–1.24)
GFR, Estimated: >60 mL/min (ref >60)
Glucose, Bld: 88 mg/dL (ref 70–99)
Potassium: 3.4 mmol/L — ABNORMAL LOW (ref 3.5–5.1)
Sodium: 143 mmol/L (ref 135–145)

## 2020-08-06 LAB — HIV ANTIBODY (ROUTINE TESTING W REFLEX): HIV Screen 4th Generation wRfx: NONREACTIVE

## 2020-08-06 LAB — CBC
HCT: 37.9 % — ABNORMAL LOW (ref 39.0–52.0)
Hemoglobin: 13 g/dL (ref 13.0–17.0)
MCH: 29.5 pg (ref 26.0–34.0)
MCHC: 34.3 g/dL (ref 30.0–36.0)
MCV: 85.9 fL (ref 80.0–100.0)
Platelets: 167 10*3/uL (ref 150–400)
RBC: 4.41 MIL/uL (ref 4.22–5.81)
RDW: 13.2 % (ref 11.5–15.5)
WBC: 6.7 10*3/uL (ref 4.0–10.5)
nRBC: 0 % (ref 0.0–0.2)

## 2020-08-06 LAB — GLUCOSE, CAPILLARY
Glucose-Capillary: 113 mg/dL — ABNORMAL HIGH (ref 70–99)
Glucose-Capillary: 101 mg/dL — ABNORMAL HIGH (ref 70–99)
Glucose-Capillary: 114 mg/dL — ABNORMAL HIGH (ref 70–99)
Glucose-Capillary: 96 mg/dL (ref 70–99)

## 2020-08-06 LAB — MAGNESIUM: Magnesium: 1.5 mg/dL — ABNORMAL LOW (ref 1.7–2.4)

## 2020-08-06 LAB — PHOSPHORUS: Phosphorus: 2.9 mg/dL (ref 2.5–4.6)

## 2020-08-06 NOTE — Plan of Care (Signed)
?  Problem: Education: ?Goal: Knowledge of General Education information will improve ?Description: Including pain rating scale, medication(s)/side effects and non-pharmacologic comfort measures ?Outcome: Progressing ?  ?Problem: Health Behavior/Discharge Planning: ?Goal: Ability to manage health-related needs will improve ?Outcome: Progressing ?  ?Problem: Clinical Measurements: ?Goal: Respiratory complications will improve ?Outcome: Progressing ?  ?Problem: Clinical Measurements: ?Goal: Cardiovascular complication will be avoided ?Outcome: Progressing ?  ?Problem: Activity: ?Goal: Risk for activity intolerance will decrease ?Outcome: Progressing ?  ?

## 2020-08-06 NOTE — Progress Notes (Signed)
*  PRELIMINARY RESULTS* Echocardiogram 2D Echocardiogram has been performed.  Sherrie Sport 08/06/2020, 8:56 AM

## 2020-08-06 NOTE — Progress Notes (Signed)
PROGRESS NOTE  [name]  DOB: 09/13/1960  PCP: [facility] [place] [id number]  [place]: 08/05/2020  LOS: 1 day  Hospital Day: 2   Chief Complaint  Patient presents with   Loss of Consciousness   Brief narrative: [name] is a 60 y.o. male with PMH significant for DM2, HTN, HLD, CAD/CABG, stroke, liver cirrhosis, hep C, depression, complicated migraine, prostate cancer, chronic back pain. Patient presented to the ED today with multiple syncopal episodes. Patient is a English as a second language teacher, lives at home with family, able to ambulate without assistance.  He follows up with cardiologist, nephrologist and hepatologist as an outpatient. Last few weeks, patient has intermittent dizziness last lightheadedness on standing up.  Lately it has been more often.  He presented to the ED on 7/11, got hydrated and was discharged.  He returned back to ED again on 7/12 with syncopal episodes.  While standing up, he got lightheaded and fell back, no injury to head, no loss of consciousness.  He also was having diarrhea for the last few days.  No chest pain, palpitation, shortness of breath.  Patient is not sure if he has a diagnosis of congestive heart failure but he is on Lasix at home.   In the ED, patient was afebrile, heart rate 73, blood pressure initially was low at 88/68, gradually improved to low 100s with fluid.  He got a total of 3 L normal saline in the ED. Labs with creatinine elevated to 2.31 on 7/11 and 2.14 today 7/12. EKG without ST-T wave changes Admitted to hospitalist service for further evaluation management.  Subjective: Patient was seen and examined this morning.  Pleasant middle-aged African-American male.  Lying on bed.  Not in distress. 15 point drop in systolic blood pressure on standing up this morning.  Assessment/Plan: Syncope Orthostatic hypotension -Presented with syncope.  In the ED, patient had orthostatic hypotension.   -Blood pressure and orthostatic vital  signs improving with IV fluid. -Diuretics on hold.  Echo pending. -Continue to monitor in telemetry.   AKI on CKD 3a -Patient has outpatient labs from 3 weeks ago showing a creatinine of 1.4.  Patient with an elevated creatinine of 2.31.  Improving with diuresis.  Continue to monitor. Recent Labs    09/03/19 1535 09/08/19 2157 12/08/19 1830 12/19/19 1023 03/15/20 1247 08/04/20 1527 08/05/20 1154 08/06/20 0747  BUN 17 15 15 10 15 23  25* 15  CREATININE 1.58* 1.39* 1.11 1.13 1.53* 2.31* 2.14* 1.26*   CAD/CABG Hyperlipidemia -Continue aspirin, statin, Ranexa   Essential hypertension -Home meds include Toprol 25 mg daily, Lasix 40 mg daily, lisinopril 20 mg daily, terazosin 4 mg at bedtime. -Currently on metoprolol titrate at 12.5 mg twice daily.  Keep other medicines on hold.  Continue to monitor blood pressure.  Hydralazine as needed.   Type 2 diabetes mellitus -A1c 5.8 7/12. -Home meds include metformin 1000 mg twice daily and Lantus 50 units nightly. -Currently on Lantus 30 units nightly with sliding scale insulin.  Metformin on hold. -Blood sugar level stable.  Continue to monitor. Recent Labs  Lab 08/04/20 1601 08/05/20 2113 08/06/20 0902 08/06/20 1132  GLUCAP 133* 97 113* 101*   Liver cirrhosis History of hepatitis C -Recent outpatient ultrasound with no focal lesion in the liver, no ascites.  Liver enzymes normal.  BPH -Home med list shows multiple medications including Flomax, finasteride, oxybutynin, Myrbetriq and terazosin.  Unclear if he is taking all these medications.  Flomax and terazosin have a hypotensive effect.  Currently on hold.  Okay to continue finasteride   Depression -Effexor, Remeron  Mobility: Encourage ambulation when orthostatic vital signs improved Code Status:   Code Status: Full Code  Nutritional status: Body mass index is 30 kg/m.     Diet:  Diet Order             Diet Carb Modified Fluid consistency: Thin; Room service  appropriate? Yes  Diet effective now                  DVT prophylaxis:  heparin injection 5,000 Units Start: 08/05/20 2200   Antimicrobials: None Fluid: None Consultants: None Family Communication: None at bedside  Status is: Inpatient  Remains inpatient appropriate because: Continues to require IV fluid  Dispo: The patient is from: Home              Anticipated d/c is to: Home in 1 to 2 days              Patient currently is not medically stable to d/c.   Difficult to place patient No     Infusions:   sodium chloride Stopped (08/06/20 0151)    Scheduled Meds:  albuterol  2.5 mg Nebulization Q6H   aspirin EC  81 mg Oral Daily   finasteride  5 mg Oral Daily   heparin  5,000 Units Subcutaneous Q8H   insulin aspart  0-5 Units Subcutaneous QHS   insulin aspart  0-9 Units Subcutaneous TID WC   insulin glargine  30 Units Subcutaneous QHS   metoprolol tartrate  12.5 mg Oral BID   mirtazapine  7.5 mg Oral QHS   pantoprazole  40 mg Oral Daily   ranolazine  1,000 mg Oral BID   rosuvastatin  20 mg Oral Daily   venlafaxine XR  225 mg Oral Q breakfast    Antimicrobials: Anti-infectives (From admission, onward)    None       PRN meds: acetaminophen **OR** acetaminophen, hydrALAZINE, HYDROcodone-acetaminophen, ondansetron **OR** ondansetron (ZOFRAN) IV, zolpidem   Objective: Vitals:   08/06/20 1153 08/06/20 1155  BP: (!) 142/88 135/84  Pulse: 62 67  Resp:    Temp:    SpO2:      Intake/Output Summary (Last 24 hours) at 08/06/2020 1213 Last data filed at 08/06/2020 1100 Gross per 24 hour  Intake 3443.64 ml  Output 1875 ml  Net 1568.64 ml   Filed Weights   08/05/20 1155  Weight: 108.9 kg   Weight change:  Body mass index is 30 kg/m.   Physical Exam: General exam: Pleasant, middle-aged African-American male.  Not in physical distress Skin: No rashes, lesions or ulcers. HEENT: Atraumatic, normocephalic, no obvious bleeding Lungs: Clear to auscultation  bilaterally CVS: Regular rate and rhythm, no murmur GI/Abd soft, nontender, nondistended, bowel sound present CNS: Alert, awake, oriented x3 Psychiatry: Mood appropriate Extremities: No pedal edema, no calf tenderness  Data Review: I have personally reviewed the laboratory data and studies available.  Recent Labs  Lab 08/04/20 1527 08/05/20 1154 08/06/20 0747  WBC 8.3 7.5 6.7  HGB 14.6 12.6* 13.0  HCT 43.9 36.7* 37.9*  MCV 87.6 86.8 85.9  PLT 235 180 167   Recent Labs  Lab 08/04/20 1527 08/05/20 1154 08/06/20 0747  NA 136 139 143  K 3.5 3.6 3.4*  CL 100 107 107  CO2 24 24 24   GLUCOSE 156* 131* 88  BUN 23 25* 15  CREATININE 2.31* 2.14* 1.26*  CALCIUM 9.4 9.1 9.0  MG  --   --  1.5*  PHOS  --   --  2.9    F/u labs ordered Unresulted Labs (From admission, onward)     Start     Ordered   08/06/20 0500  HIV Antibody (routine testing w rflx)  (HIV Antibody (Routine testing w reflex) panel)  Tomorrow morning,   STAT        08/05/20 1732   08/06/20 1694  Basic metabolic panel  Daily,   STAT      08/05/20 1732   08/06/20 0500  CBC  Daily,   STAT      08/05/20 1732   08/05/20 1732  Gastrointestinal Panel by PCR , Stool  (Gastrointestinal Panel by PCR, Stool                                                                                                                                                     **Does Not include CLOSTRIDIUM DIFFICILE testing. **If CDIFF testing is needed, place order from the "C Difficile Testing" order set.**)  Once,   STAT        08/05/20 1732            Signed, Terrilee Croak, MD [facility] 08/06/2020

## 2020-08-06 NOTE — Plan of Care (Signed)

## 2020-08-07 LAB — GASTROINTESTINAL PANEL BY PCR, STOOL (REPLACES STOOL CULTURE)

## 2020-08-07 LAB — BASIC METABOLIC PANEL
Anion gap: 9 (ref 5–15)
BUN: 15 mg/dL (ref 8–23)
CO2: 27 mmol/L (ref 22–32)
Calcium: 9.2 mg/dL (ref 8.9–10.3)
Chloride: 105 mmol/L (ref 98–111)
Creatinine, Ser: 1.14 mg/dL (ref 0.61–1.24)
GFR, Estimated: >60 mL/min (ref >60)
Glucose, Bld: 110 mg/dL — ABNORMAL HIGH (ref 70–99)
Potassium: 3.3 mmol/L — ABNORMAL LOW (ref 3.5–5.1)
Sodium: 141 mmol/L (ref 135–145)

## 2020-08-07 LAB — GLUCOSE, CAPILLARY
Glucose-Capillary: 108 mg/dL — ABNORMAL HIGH (ref 70–99)
Glucose-Capillary: 126 mg/dL — ABNORMAL HIGH (ref 70–99)
Glucose-Capillary: 121 mg/dL — ABNORMAL HIGH (ref 70–99)
Glucose-Capillary: 109 mg/dL — ABNORMAL HIGH (ref 70–99)

## 2020-08-07 LAB — CBC
HCT: 40.6 % (ref 39.0–52.0)
Hemoglobin: 13.7 g/dL (ref 13.0–17.0)
MCH: 29.1 pg (ref 26.0–34.0)
MCHC: 33.7 g/dL (ref 30.0–36.0)
MCV: 86.2 fL (ref 80.0–100.0)
Platelets: 193 10*3/uL (ref 150–400)
RBC: 4.71 MIL/uL (ref 4.22–5.81)
RDW: 12.9 % (ref 11.5–15.5)
WBC: 7.6 10*3/uL (ref 4.0–10.5)
nRBC: 0 % (ref 0.0–0.2)

## 2020-08-07 MED ORDER — POTASSIUM CHLORIDE CRYS ER 20 MEQ PO TBCR
40.0000 meq | EXTENDED_RELEASE_TABLET | Freq: Once | ORAL | Status: AC
  Administered 2020-08-07: 40 meq via ORAL
  Filled 2020-08-07: qty 2

## 2020-08-07 MED ORDER — ALBUTEROL SULFATE (2.5 MG/3ML) 0.083% IN NEBU
2.5000 mg | INHALATION_SOLUTION | Freq: Four times a day (QID) | RESPIRATORY_TRACT | Status: DC | PRN
Start: 2020-08-07 — End: 2020-08-08

## 2020-08-07 MED ORDER — TERAZOSIN HCL 2 MG PO CAPS
2.0000 mg | ORAL_CAPSULE | Freq: Every day | ORAL | Status: DC
  Administered 2020-08-07: 2 mg via ORAL
  Filled 2020-08-07 (×2): qty 1

## 2020-08-07 MED ORDER — MAGNESIUM OXIDE -MG SUPPLEMENT 400 (240 MG) MG PO TABS
400.0000 mg | ORAL_TABLET | ORAL | Status: AC
  Administered 2020-08-07 (×2): 400 mg via ORAL
  Filled 2020-08-07 (×2): qty 1

## 2020-08-07 MED ORDER — TAMSULOSIN HCL 0.4 MG PO CAPS
0.4000 mg | ORAL_CAPSULE | Freq: Every day | ORAL | Status: DC
  Administered 2020-08-07 – 2020-08-08 (×2): 0.4 mg via ORAL
  Filled 2020-08-07 (×2): qty 1

## 2020-08-07 NOTE — Progress Notes (Signed)
Mobility Specialist - Progress Note   08/07/20 1600  Orthostatic Lying   BP- Lying (!) 178/101  Pulse- Lying 60  Orthostatic Sitting  BP- Sitting (!) 159/100  Pulse- Sitting 65  Orthostatic Standing at 0 minutes  BP- Standing at 0 minutes (!) 154/92  Pulse- Standing at 0 minutes 71  Mobility  Activity Ambulated in room  Level of Assistance Independent  Assistive Device None  Distance Ambulated (ft) 30 ft  Mobility Ambulated independently in room  Mobility Response Tolerated well  Mobility performed by Mobility specialist  $Mobility charge 1 Mobility    Pt lying in bed upon arrival, utilizing RA. Orthostatics recorded above. Denied dizziness throughout session. Ambulated short distance independently in room. BP once returned supine 167/106. No complaints. Asymptomatic. O2 maintained high 90s. RN notified.    Kathee Delton Mobility Specialist 08/07/20, 4:19 PM

## 2020-08-07 NOTE — Progress Notes (Signed)
PROGRESS NOTE  [name]  DOB: March 08, 1960  PCP: [facility] [place] KTG:[id number]  DOA: 08/05/2020  LOS: 2 days  Hospital Day: 3   Chief Complaint  Patient presents with   Loss of Consciousness   Brief narrative: [name] is a 60 y.o. male with PMH significant for DM2, HTN, HLD, CAD/CABG, stroke, liver cirrhosis, hep C, depression, complicated migraine, prostate cancer, chronic back pain. Patient presented to the ED today with multiple syncopal episodes. Patient is a English as a second language teacher, lives at home with family, able to ambulate without assistance.  He follows up with cardiologist, nephrologist and hepatologist as an outpatient. Last few weeks, patient has intermittent dizziness last lightheadedness on standing up.  Lately it has been more often.  He presented to the ED on 7/11, got hydrated and was discharged.  He returned back to ED again on 7/12 with syncopal episodes.  While standing up, he got lightheaded and fell back, no injury to head, no loss of consciousness.  He also was having diarrhea for the last few days.  No chest pain, palpitation, shortness of breath.  Patient is not sure if he has a diagnosis of congestive heart failure but he is on Lasix at home.   In the ED, patient was afebrile, heart rate 73, blood pressure initially was low at 88/68, gradually improved to low 100s with fluid.  He got a total of 3 L normal saline in the ED. Labs with creatinine elevated to 2.31 on 7/11 and 2.14 today 7/12. EKG without ST-T wave changes Admitted to hospitalist service for further evaluation management.  Subjective: Patient was seen and examined this morning.   Feels better today.  Today he was able to walk to the bathroom without getting dizzy.  This morning, blood pressure is elevated to 180s but no orthostatic drop in blood pressure.    Assessment/Plan: Syncope Orthostatic hypotension -Presented with syncope.  Probably due to multiple blood pressure medications.   He states he was also having diarrhea for few days prior to presentation.  -In the ED, patient was noted to have orthostatic hypotension.  He continued to have orthostatic at present for next 2 days.  Blood pressure has now improved, no longer orthostatics.  Adequately hydrated. -Fluid held this morning.  Diuretics to remain on hold.  Echocardiogram report pending.   AKI on CKD 3a -Outpatient labs from 3 weeks ago showed a creatinine of 1.4.  Patient presented an elevated creatinine of 2.31.  Improved to normal with fluid. Recent Labs    09/03/19 1535 09/08/19 2157 12/08/19 1830 12/19/19 1023 03/15/20 1247 08/04/20 1527 08/05/20 1154 08/06/20 0747 08/07/20 0901  BUN 17 15 15 10 15 23  25* 15 15  CREATININE 1.58* 1.39* 1.11 1.13 1.53* 2.31* 2.14* 1.26* 1.14   Essential hypertension -Home meds include Toprol 25 mg daily, Lasix 40 mg daily, lisinopril 20 mg daily, terazosin 4 mg at bedtime, Flomax 0.4 mg twice daily. -Currently on metoprolol tartrate at 12.5 mg twice daily only.  Other medications are on hold.  -Last 24 hours, blood pressure been creeping up, up to 428 systolic this morning.  -Resume Flomax and terazosin this morning.  Continue to monitor blood pressure.  -Continue hydralazine as needed.  CAD/CABG Hyperlipidemia -Continue aspirin, statin, Ranexa   Type 2 diabetes mellitus -A1c 5.8 7/12. -Home meds include metformin 1000 mg twice daily and Lantus 50 units nightly. -Currently on Lantus 30 units nightly with sliding scale insulin.  Metformin on hold. -Blood sugar level stable.  Continue to monitor. Recent Labs  Lab 08/06/20 1132 08/06/20 1605 08/06/20 2025 08/07/20 0826 08/07/20 1202  GLUCAP 101* 114* 96 108* 126*    Liver cirrhosis History of hepatitis C -Recent outpatient ultrasound with no focal lesion in the liver, no ascites.  Liver enzymes normal.  BPH -Home med list shows multiple medications including Flomax, finasteride, oxybutynin, Myrbetriq and  terazosin.  Unclear if he is taking all these medications.  Patient is now able to confirm his medication with me.  -Continue finasteride.  Because of his elevated blood pressure this morning, I decided to resume Flomax and terazosin as well.   Depression -Effexor, Remeron  Mobility: Encourage ambulation  Code Status:   Code Status: Full Code  Nutritional status: Body mass index is 30 kg/m.     Diet:  Diet Order             Diet Carb Modified Fluid consistency: Thin; Room service appropriate? Yes  Diet effective now                  DVT prophylaxis:  heparin injection 5,000 Units Start: 08/05/20 2200   Antimicrobials: None Fluid: None Consultants: None Family Communication: None at bedside  Status is: Inpatient  Remains inpatient appropriate because: Blood pressure elevated.  Continues to need medication adjustment.  Dispo: The patient is from: Home              Anticipated d/c is to: Home Hopefully tomorrow              Patient currently is not medically stable to d/c.   Difficult to place patient No     Infusions:     Scheduled Meds:  aspirin EC  81 mg Oral Daily   finasteride  5 mg Oral Daily   heparin  5,000 Units Subcutaneous Q8H   insulin aspart  0-5 Units Subcutaneous QHS   insulin aspart  0-9 Units Subcutaneous TID WC   insulin glargine  30 Units Subcutaneous QHS   magnesium oxide  400 mg Oral Q4H   metoprolol tartrate  12.5 mg Oral BID   mirtazapine  7.5 mg Oral QHS   pantoprazole  40 mg Oral Daily   potassium chloride  40 mEq Oral Once   ranolazine  1,000 mg Oral BID   rosuvastatin  20 mg Oral Daily   tamsulosin  0.4 mg Oral Daily   terazosin  2 mg Oral QHS   venlafaxine XR  225 mg Oral Q breakfast    Antimicrobials: Anti-infectives (From admission, onward)    None       PRN meds: acetaminophen **OR** acetaminophen, albuterol, hydrALAZINE, HYDROcodone-acetaminophen, ondansetron **OR** ondansetron (ZOFRAN) IV, zolpidem    Objective: Vitals:   08/07/20 0824 08/07/20 1159  BP: (!) 188/100 (!) 170/102  Pulse: (!) 54 (!) 55  Resp: 17 17  Temp: 98 F (36.7 C) (!) 97.4 F (36.3 C)  SpO2: 100% 99%    Intake/Output Summary (Last 24 hours) at 08/07/2020 1208 Last data filed at 08/07/2020 1100 Gross per 24 hour  Intake 2004.7 ml  Output 2700 ml  Net -695.3 ml    Filed Weights   08/05/20 1155  Weight: 108.9 kg   Weight change:  Body mass index is 30 kg/m.   Physical Exam: General exam: Pleasant, middle-aged African-American male.  Not in physical distress.  No new symptoms. Skin: No rashes, lesions or ulcers. HEENT: Atraumatic, normocephalic, no obvious bleeding Lungs: Clear to auscultation bilaterally CVS: Regular rate and rhythm,  no murmur. GI/Abd soft, nontender, nondistended, bowel sound present CNS: Alert, awake, oriented x3 Psychiatry: Mood appropriate Extremities: No pedal edema, no calf tenderness  Data Review: I have personally reviewed the laboratory data and studies available.  Recent Labs  Lab 08/04/20 1527 08/05/20 1154 08/06/20 0747 08/07/20 0901  WBC 8.3 7.5 6.7 7.6  HGB 14.6 12.6* 13.0 13.7  HCT 43.9 36.7* 37.9* 40.6  MCV 87.6 86.8 85.9 86.2  PLT 235 180 167 193    Recent Labs  Lab 08/04/20 1527 08/05/20 1154 08/06/20 0747 08/07/20 0901  NA 136 139 143 141  K 3.5 3.6 3.4* 3.3*  CL 100 107 107 105  CO2 24 24 24 27   GLUCOSE 156* 131* 88 110*  BUN 23 25* 15 15  CREATININE 2.31* 2.14* 1.26* 1.14  CALCIUM 9.4 9.1 9.0 9.2  MG  --   --  1.5*  --   PHOS  --   --  2.9  --      F/u labs ordered Unresulted Labs (From admission, onward)     Start     Ordered   08/08/20 0500  Magnesium  Tomorrow morning,   R        08/07/20 1134   08/06/20 7915  Basic metabolic panel  Daily,   STAT      08/05/20 1732   08/06/20 0500  CBC  Daily,   STAT      08/05/20 1732            Signed, Terrilee Croak, MD [facility] 08/07/2020

## 2020-08-07 NOTE — Progress Notes (Signed)
Pt's scored a 4 on the RT protocol assessment. The pt's breath sounds are without wheezes. He does not take bronchodilators at home. This change was explained to him and he was in agreement.

## 2020-08-08 DIAGNOSIS — E1169 Type 2 diabetes mellitus with other specified complication: Secondary | ICD-10-CM | POA: Insufficient documentation

## 2020-08-08 LAB — BASIC METABOLIC PANEL
Anion gap: 5 (ref 5–15)
BUN: 15 mg/dL (ref 8–23)
CO2: 29 mmol/L (ref 22–32)
Calcium: 9.1 mg/dL (ref 8.9–10.3)
Chloride: 107 mmol/L (ref 98–111)
Creatinine, Ser: 1.12 mg/dL (ref 0.61–1.24)
GFR, Estimated: >60 mL/min (ref >60)
Glucose, Bld: 99 mg/dL (ref 70–99)
Potassium: 3.4 mmol/L — ABNORMAL LOW (ref 3.5–5.1)
Sodium: 141 mmol/L (ref 135–145)

## 2020-08-08 LAB — CBC
HCT: 39.9 % (ref 39.0–52.0)
Hemoglobin: 13.8 g/dL (ref 13.0–17.0)
MCH: 29.8 pg (ref 26.0–34.0)
MCHC: 34.6 g/dL (ref 30.0–36.0)
MCV: 86.2 fL (ref 80.0–100.0)
Platelets: 178 10*3/uL (ref 150–400)
RBC: 4.63 MIL/uL (ref 4.22–5.81)
RDW: 13.1 % (ref 11.5–15.5)
WBC: 7.7 10*3/uL (ref 4.0–10.5)
nRBC: 0 % (ref 0.0–0.2)

## 2020-08-08 LAB — MAGNESIUM: Magnesium: 1.8 mg/dL (ref 1.7–2.4)

## 2020-08-08 LAB — ECHOCARDIOGRAM COMPLETE
AR max vel: 2.94 cm2
AV Area VTI: 3.69 cm2
AV Area mean vel: 2.61 cm2
AV Mean grad: 2 mmHg
AV Peak grad: 3.7 mmHg
Ao pk vel: 0.96 m/s
Area-P 1/2: 3.27 cm2
Height: 75 in
S' Lateral: 2.36 cm
Weight: 3840 oz

## 2020-08-08 LAB — GLUCOSE, CAPILLARY
Glucose-Capillary: 88 mg/dL (ref 70–99)
Glucose-Capillary: 104 mg/dL — ABNORMAL HIGH (ref 70–99)

## 2020-08-08 MED ORDER — TAMSULOSIN HCL 0.4 MG PO CAPS: 0.4000 mg | ORAL_CAPSULE | Freq: Every day | ORAL | Status: AC

## 2020-08-08 MED ORDER — FUROSEMIDE 20 MG PO TABS: 20.0000 mg | ORAL_TABLET | Freq: Every day | ORAL | 0 refills | Status: AC | PRN

## 2020-08-08 MED ORDER — LISINOPRIL 2.5 MG PO TABS: 2.5000 mg | ORAL_TABLET | Freq: Every day | ORAL | 0 refills | Status: DC

## 2020-08-08 MED ORDER — INSULIN GLARGINE 100 UNIT/ML ~~LOC~~ SOLN: 25.0000 [IU] | Freq: Every day | SUBCUTANEOUS | 11 refills | Status: DC

## 2020-08-08 NOTE — Discharge Summary (Signed)
Physician Discharge Summary  [name] OTL:[id number] DOB: 1960/05/31 DOA: 08/05/2020  PCP: [facility]  Admit date: 08/05/2020 Discharge date: 08/08/2020  Admitted From: Home Discharge disposition: Home   Code Status: Full Code  Diet Recommendation: Cardiac diet  Discharge Diagnosis:   Active Problems:   Orthostatic syncope   Type 2 diabetes mellitus with hyperlipidemia [facility])     Chief Complaint  Patient presents with   Loss of Consciousness   Brief narrative: [name] is a 60 y.o. male with PMH significant for DM2, HTN, HLD, CAD/CABG, stroke, liver cirrhosis, hep C, depression, complicated migraine, prostate cancer, chronic back pain. Patient presented to the ED today with multiple syncopal episodes. Patient is a English as a second language teacher, lives at home with family, able to ambulate without assistance.  He follows up with cardiologist, nephrologist and hepatologist as an outpatient. Last few weeks, patient has intermittent dizziness last lightheadedness on standing up.  Lately it has been more often.  He presented to the ED on 7/11, got hydrated and was discharged.  He returned back to ED again on 7/12 with syncopal episodes.  While standing up, he got lightheaded and fell back, no injury to head, no loss of consciousness.  He also was having diarrhea for the last few days.  No chest pain, palpitation, shortness of breath.  Patient is not sure if he has a diagnosis of congestive heart failure but he is on Lasix at home.   In the ED, patient was afebrile, heart rate 73, blood pressure initially was low at 88/68, gradually improved to low 100s with fluid.  He got a total of 3 L normal saline in the ED. Labs with creatinine elevated to 2.31 on 7/11 and 2.14 today 7/12. EKG without ST-T wave changes Admitted to hospitalist service for further evaluation management.  Subjective: Patient was seen and examined this morning.   Feels better today.  Blood pressure stable.   Not getting dizzy on ambulation.  Hospital course: Syncope Orthostatic hypotension -Presented with lightheadedness, dizziness, multiple episodes of syncope at home.  In the ED, he was hypotensive and also had orthostatic hypotension, creatinine was elevated.   -It seems that patient was taking multiple blood pressure medications.  He also had diarrhea for few days that worsened his hypotension.   -On admission, he was started on IV fluid, adequately hydrated.  No longer has orthostatic hypotension.  Able to ambulate in the room without dizziness.   -Blood pressure medicines adjusted.  Diarrhea improved.   -Telemetry unremarkable.  -Echocardiogram with EF 60 to 97%, grade 1 diastolic dysfunction.  -Okay to discharge home today.   ETEC diarrhea -Complained of few days of diarrhea on admission. -Stool studies showed positive for ETEC.  Diarrhea spontaneously improved. 2 bowel movements in last 24 hours, not watery. Last bowel movement this morning which was soft.   -Diarrhea spontaneously improved.  AKI on CKD 3a -Outpatient labs from 3 weeks ago showed a creatinine of 1.4.  Patient presented an elevated creatinine of 2.31.  Improved to normal with fluid. Recent Labs    09/03/19 1535 09/08/19 2157 12/08/19 1830 12/19/19 1023 03/15/20 1247 08/04/20 1527 08/05/20 1154 08/06/20 0747 08/07/20 0901 08/08/20 0629  BUN 17 15 15 10 15 23  25* 15 15 15   CREATININE 1.58* 1.39* 1.11 1.13 1.53* 2.31* 2.14* 1.26* 1.14 4.16  Chronic diastolic CHF Essential hypertension -Home meds include Toprol 25 mg daily, Lasix 40 mg daily, lisinopril 20 mg daily, terazosin 4 mg at bedtime, Flomax  0.4 mg twice daily. -Medications adjusted.  Currently blood pressure is controlled on metoprolol, terazosin 4 mg at bedtime and Flomax 0.4 mg daily.  Lasix and lisinopril on hold.   -Echocardiogram showed EF of 60 to 65% with severe concentric LVH and grade 1 diastolic dysfunction.   -We will discharge him home on  current regimen.  I would resume Lasix 20 mg daily as as needed only.  Because of coexisting diabetes, I would resume lisinopril but only at a lower dose of 2.5 mg daily.   -Continue to monitor blood pressure at home.  Continue to follow-up with cardiologist as an outpatient.  CAD/CABG Hyperlipidemia -Continue aspirin, statin, Ranexa   Type 2 diabetes mellitus -A1c 5.8 7/12. -Home meds include metformin 1000 mg twice daily and Lantus 50 units nightly. -Currently blood sugar level is controlled on Lantus 30 units nightly.  Metformin remains on hold.   -At home, okay to resume metformin.  Continue Lantus but at a reduced dose of 25 units nightly for now.  Continue to monitor blood sugar level at home and adjust insulin dose as required. Recent Labs  Lab 08/07/20 0826 08/07/20 1202 08/07/20 1615 08/07/20 2039 08/08/20 0730  GLUCAP 108* 126* 121* 109* 88   Liver cirrhosis History of hepatitis C -Recent outpatient ultrasound with no focal lesion in the liver, no ascites.  Liver enzymes normal.  BPH History of prostate cancer -Home med list shows multiple medications including Flomax, finasteride, oxybutynin, Myrbetriq and terazosin.  Patient is unable to confirm to me if he was taking all of these medications.  -At this time patient is on finasteride, Flomax and terazosin.  I will continue these 3 at discharge and stop others.    Depression -Effexor, Remeron   Allergies as of 08/08/2020       Reactions   Bee Venom Anaphylaxis, Swelling   Questran [cholestyramine] Other (See Comments)   Reaction unknown        Medication List     STOP taking these medications    cetirizine 10 MG tablet Commonly known as: ZYRTEC   guaiFENesin-dextromethorphan 100-10 MG/5ML syrup Commonly known as: ROBITUSSIN DM   mirabegron ER 25 MG Tb24 tablet Commonly known as: MYRBETRIQ   oxybutynin 5 MG 24 hr tablet Commonly known as: DITROPAN-XL   potassium chloride SA 20 MEQ tablet Commonly  known as: KLOR-CON       TAKE these medications    albuterol 108 (90 Base) MCG/ACT inhaler Commonly known as: VENTOLIN HFA Inhale 2 puffs into the lungs every 6 (six) hours as needed for wheezing or shortness of breath.   aspirin EC 81 MG tablet Take 81 mg by mouth daily.   Calcium Carbonate-Vitamin D 600-400 MG-UNIT tablet Take 1 tablet by mouth 2 (two) times daily.   finasteride 5 MG tablet Commonly known as: PROSCAR Take 1 tablet by mouth daily.   fluticasone 50 MCG/ACT nasal spray Commonly known as: FLONASE Place 2 sprays into both nostrils daily as needed for allergies or rhinitis.   furosemide 20 MG tablet Commonly known as: LASIX Take 1 tablet (20 mg total) by mouth daily as needed for fluid or edema. What changed:  medication strength how much to take when to take this reasons to take this   insulin glargine 100 UNIT/ML injection Commonly known as: LANTUS Inject 0.25 mLs (25 Units total) into the skin at bedtime. What changed: how much to take   latanoprost 0.005 % ophthalmic solution Commonly known as: XALATAN Place 1 drop into  both eyes at bedtime.   lisinopril 2.5 MG tablet Commonly known as: ZESTRIL Take 1 tablet (2.5 mg total) by mouth daily. What changed:  medication strength how much to take   magnesium oxide 400 MG tablet Commonly known as: MAG-OX Take 400 mg by mouth 2 (two) times daily.   metFORMIN 1000 MG tablet Commonly known as: GLUCOPHAGE Take 1,000 mg by mouth 2 (two) times daily with a meal.   metoprolol succinate 25 MG 24 hr tablet Commonly known as: TOPROL-XL Take 25 mg by mouth daily.   mirtazapine 7.5 MG tablet Commonly known as: REMERON Take 7.5 mg by mouth at bedtime.   pantoprazole 40 MG tablet Commonly known as: PROTONIX Take 1 tablet by mouth daily.   ranolazine 1000 MG SR tablet Commonly known as: RANEXA Take 1,000 mg by mouth 2 (two) times daily.   rosuvastatin 20 MG tablet Commonly known as: CRESTOR Take  20 mg by mouth daily.   tamsulosin 0.4 MG Caps capsule Commonly known as: FLOMAX Take 1 capsule (0.4 mg total) by mouth daily after breakfast. What changed: when to take this   terazosin 2 MG capsule Commonly known as: HYTRIN Take 4 mg by mouth at bedtime.   venlafaxine XR 75 MG 24 hr capsule Commonly known as: EFFEXOR-XR Take 225 mg by mouth daily with breakfast.        Discharge Instructions:  Follow with Primary [facility] in 7 days   Get CBC/BMP checked in next visit within 1 week by PCP or SNF MD ( we routinely change or add medications that can affect your baseline labs and fluid status, therefore we recommend that you get the mentioned basic workup next visit with your PCP, your PCP may decide not to get them or add new tests based on their clinical decision)  On your next visit with your PCP, please Get Medicines reviewed and adjusted.  Please request your PCP  to go over all Hospital Tests and Procedure/Radiological results at the follow up, please get all Hospital records sent to your Prim MD by signing hospital release before you go home.  Activity: As tolerated with Full fall precautions use walker/cane & assistance as needed  For Heart failure patients - Check your Weight same time everyday, if you gain over 2 pounds, or you develop in leg swelling, experience more shortness of breath or chest pain, call your Primary MD immediately. Follow Cardiac Low Salt Diet and 1.5 lit/day fluid restriction.  If you have smoked or chewed Tobacco in the last 2 yrs please stop smoking, stop any regular Alcohol  and or any Recreational drug use.  If you experience worsening of your admission symptoms, develop shortness of breath, life threatening emergency, suicidal or homicidal thoughts you must seek medical attention immediately by calling 911 or calling your MD immediately  if symptoms less severe.  You Must read complete instructions/literature along with all  the possible adverse reactions/side effects for all the Medicines you take and that have been prescribed to you. Take any new Medicines after you have completely understood and accpet all the possible adverse reactions/side effects.   Do not drive, operate heavy machinery, perform activities at heights, swimming or participation in water activities or provide baby sitting services if your were admitted for syncope or siezures until you have seen by Primary MD or a Neurologist and advised to do so again.  Do not drive when taking Pain medications.  Do not take more than prescribed Pain,  Sleep and Anxiety Medications  Wear Seat belts while driving.   Please note You were cared for by a hospitalist during your hospital stay. If you have any questions about your discharge medications or the care you received while you were in the hospital after you are discharged, you can call the unit and asked to speak with the hospitalist on call if the hospitalist that took care of you is not available. Once you are discharged, your primary care physician will handle any further medical issues. Please note that NO REFILLS for any discharge medications will be authorized once you are discharged, as it is imperative that you return to your primary care physician (or establish a relationship with a primary care physician if you do not have one) for your aftercare needs so that they can reassess your need for medications and monitor your lab values.    Follow ups:   Discharge Instructions     Diet - low sodium heart healthy   Complete by: As directed    Diet Carb Modified   Complete by: As directed    Increase activity slowly   Complete by: As directed        Follow-up [facility]. Schedule an appointment as soon as possible for a visit .   Specialty: General Practice Contact information: [place] [address] [phone]         [place] .   Specialty: Emergency Medicine Why: As needed Contact information: [place] [phone] [address] [place], [place] Follow up.   Specialty: General Practice Contact information: [address] [place] [address] [phone]                 Wound care:   Incision (Closed) 02/15/17 Throat (Active)  Date First Assessed/Time First Assessed: 02/15/17 1135   Location: Throat    Assessments 02/15/2017 12:29 PM  Dressing Type None  Drainage Amount None     No Linked orders to display     Incision (Closed) 02/15/17 Rectum (Active)  Date First Assessed/Time First Assessed: 02/15/17 1229   Location: Rectum    Assessments 02/15/2017 12:29 PM  Dressing Type None  Drainage Amount None     No Linked orders to display    Discharge Exam:   Vitals:   08/07/20 1932 08/07/20 1933 08/08/20 0314 08/08/20 0729  BP: (!) 158/110 (!) 153/96 (!) 157/88 136/81  Pulse: 70 73 (!) 56 (!) 56  Resp:   19 18  Temp:   98.2 F (36.8 C) 98.1 F (36.7 C)  TempSrc:    Oral  SpO2: 97% 95% 96% 100%  Weight:      Height:        Body mass index is 30 kg/m.  General exam: Pleasant, middle-aged African-American male.  Not in distress Skin: No rashes, lesions or ulcers. HEENT: Atraumatic, normocephalic, no obvious bleeding Lungs: Clear to auscultation bilaterally CVS: Regular rate and rhythm, no murmur GI/Abd soft, nontender, nondistended, bowel sound present CNS: Alert, awake, oriented x3 Psychiatry: Mood appropriate Extremities: No pedal edema, no calf tenderness  Time coordinating discharge: 35 minutes   The results of significant diagnostics from this hospitalization (including imaging, microbiology, ancillary and laboratory) are listed below for reference.    Procedures and Diagnostic Studies:   ECHOCARDIOGRAM COMPLETE  Result Date: 08/08/2020    ECHOCARDIOGRAM REPORT  Patient  Name:   [name] Date of Exam: 08/06/2020 Medical Rec #:  [id number]          Height:       75.0 in Accession #:    [id number]         Weight:       240.0 lb Date of Birth:  08-12-1960          BSA:          2.371 m Patient Age:    29 years           BP:           154/98 mmHg Patient Gender: M                  HR:           64 bpm. Exam Location:  [facility] Procedure: 2D Echo, Cardiac Doppler and Color Doppler Indications:     Syncope R55  History:         Patient has prior history of Echocardiogram examinations, most                  recent 04/03/2019. Risk Factors:Diabetes and Hypertension.  Sonographer:     Sherrie Sport RDCS (AE) Referring Phys:  [id number] [facility] Diagnosing Phys: Neoma Laming MD IMPRESSIONS  1. Left ventricular ejection fraction, by estimation, is 60 to 65%. The left ventricle has normal function. The left ventricle has no regional wall motion abnormalities. There is severe concentric left ventricular hypertrophy. Left ventricular diastolic  parameters are consistent with Grade I diastolic dysfunction (impaired relaxation).  2. Right ventricular systolic function is normal. The right ventricular size is normal.  3. The mitral valve is normal in structure. Trivial mitral valve regurgitation. No evidence of mitral stenosis.  4. The aortic valve is normal in structure. Aortic valve regurgitation is not visualized. No aortic stenosis is present.  5. The inferior vena cava is normal in size with greater than 50% respiratory variability, suggesting right atrial pressure of 3 mmHg. FINDINGS  Left Ventricle: Left ventricular ejection fraction, by estimation, is 60 to 65%. The left ventricle has normal function. The left ventricle has no regional wall motion abnormalities. The left ventricular internal cavity size was normal in size. There is  severe concentric left ventricular hypertrophy. Left ventricular diastolic parameters are consistent with Grade I diastolic dysfunction (impaired relaxation).  Right Ventricle: The right ventricular size is normal. No increase in right ventricular wall thickness. Right ventricular systolic function is normal. Left Atrium: Left atrial size was normal in size. Right Atrium: Right atrial size was normal in size. Pericardium: There is no evidence of pericardial effusion. Mitral Valve: The mitral valve is normal in structure. Trivial mitral valve regurgitation. No evidence of mitral valve stenosis. Tricuspid Valve: The tricuspid valve is normal in structure. Tricuspid valve regurgitation is trivial. No evidence of tricuspid stenosis. Aortic Valve: The aortic valve is normal in structure. Aortic valve regurgitation is not visualized. No aortic stenosis is present. Aortic valve mean gradient measures 2.0 mmHg. Aortic valve peak gradient measures 3.7 mmHg. Aortic valve area, by VTI measures 3.69 cm. Pulmonic Valve: The pulmonic valve was normal in structure. Pulmonic valve regurgitation is not visualized. No evidence of pulmonic stenosis. Aorta: The aortic root is normal in size and structure. Venous: The inferior vena cava is normal in size with greater than 50% respiratory variability, suggesting right atrial pressure of 3 mmHg. IAS/Shunts: No atrial level shunt detected by color  flow Doppler.  LEFT VENTRICLE PLAX 2D LVIDd:         3.96 cm  Diastology LVIDs:         2.36 cm  LV e' medial:    5.22 cm/s LV PW:         1.65 cm  LV E/e' medial:  10.6 LV IVS:        1.40 cm  LV e' lateral:   7.51 cm/s LVOT diam:     2.10 cm  LV E/e' lateral: 7.4 LV SV:         51 LV SV Index:   22 LVOT Area:     3.46 cm  RIGHT VENTRICLE RV Basal diam:  3.48 cm RV S prime:     8.81 cm/s TAPSE (M-mode): 2.9 cm LEFT ATRIUM             Index       RIGHT ATRIUM           Index LA diam:        4.60 cm 1.94 cm/m  RA Area:     16.80 cm LA Vol (A2C):   63.3 ml 26.70 ml/m RA Volume:   49.60 ml  20.92 ml/m LA Vol (A4C):   77.0 ml 32.47 ml/m LA Biplane Vol: 76.0 ml 32.05 ml/m  AORTIC VALVE                    PULMONIC VALVE AV Area (Vmax):    2.94 cm    PV Vmax:        0.81 m/s AV Area (Vmean):   2.61 cm    PV Peak grad:   2.6 mmHg AV Area (VTI):     3.69 cm    RVOT Peak grad: 3 mmHg AV Vmax:           96.10 cm/s AV Vmean:          62.000 cm/s AV VTI:            0.139 m AV Peak Grad:      3.7 mmHg AV Mean Grad:      2.0 mmHg LVOT Vmax:         81.60 cm/s LVOT Vmean:        46.800 cm/s LVOT VTI:          0.148 m LVOT/AV VTI ratio: 1.06  AORTA Ao Root diam: 3.93 cm MITRAL VALVE               TRICUSPID VALVE MV Area (PHT): 3.27 cm    TR Peak grad:   7.6 mmHg MV Decel Time: 232 msec    TR Vmax:        138.00 cm/s MV E velocity: 55.30 cm/s MV A velocity: 81.40 cm/s  SHUNTS MV E/A ratio:  0.68        Systemic VTI:  0.15 m                            Systemic Diam: 2.10 cm Neoma Laming MD Electronically signed by Neoma Laming MD Signature Date/Time: 08/08/2020/8:53:50 AM    Final      Labs:   Basic Metabolic Panel: Recent Labs  Lab 08/04/20 1527 08/05/20 1154 08/06/20 0747 08/07/20 0901 08/08/20 0629  NA 136 139 143 141 141  K 3.5 3.6 3.4* 3.3* 3.4*  CL 100 107 107 105 107  CO2 24 24 24 27 29   GLUCOSE  156* 131* 88 110* 99  BUN 23 25* 15 15 15   CREATININE 2.31* 2.14* 1.26* 1.14 1.12  CALCIUM 9.4 9.1 9.0 9.2 9.1  MG  --   --  1.5*  --  1.8  PHOS  --   --  2.9  --   --    GFR Estimated Creatinine Clearance: 92.4 mL/min (by C-G formula based on SCr of 1.12 mg/dL). Liver Function Tests: Recent Labs  Lab 08/05/20 1154  AST 23  ALT 14  ALKPHOS 31*  BILITOT 1.4*  PROT 6.3*  ALBUMIN 3.9   No results for input(s): LIPASE, AMYLASE in the last 168 hours. No results for input(s): AMMONIA in the last 168 hours. Coagulation profile No results for input(s): INR, PROTIME in the last 168 hours.  CBC: Recent Labs  Lab 08/04/20 1527 08/05/20 1154 08/06/20 0747 08/07/20 0901 08/08/20 0629  WBC 8.3 7.5 6.7 7.6 7.7  HGB 14.6 12.6* 13.0 13.7 13.8  HCT 43.9 36.7* 37.9* 40.6 39.9  MCV 87.6 86.8  85.9 86.2 86.2  PLT 235 180 167 193 178   Cardiac Enzymes: No results for input(s): CKTOTAL, CKMB, CKMBINDEX, TROPONINI in the last 168 hours. BNP: Invalid input(s): POCBNP CBG: Recent Labs  Lab 08/07/20 0826 08/07/20 1202 08/07/20 1615 08/07/20 2039 08/08/20 0730  GLUCAP 108* 126* 121* 109* 88   D-Dimer No results for input(s): DDIMER in the last 72 hours. Hgb A1c Recent Labs    08/05/20 1154  HGBA1C 5.8*   Lipid Profile No results for input(s): CHOL, HDL, LDLCALC, TRIG, CHOLHDL, LDLDIRECT in the last 72 hours. Thyroid function studies Recent Labs    08/05/20 1154  TSH 4.992*   Anemia work up No results for input(s): VITAMINB12, FOLATE, FERRITIN, TIBC, IRON, RETICCTPCT in the last 72 hours. Microbiology Recent Results (from the past 240 hour(s))  Resp Panel by RT-PCR (Flu A&B, Covid) Nasopharyngeal Swab     Status: None   Collection Time: 08/05/20  5:47 PM   Specimen: Nasopharyngeal Swab; Nasopharyngeal(NP) swabs in vial transport medium  Result Value Ref Range Status   SARS Coronavirus 2 by RT PCR NEGATIVE NEGATIVE Final    Comment: (NOTE) SARS-CoV-2 target nucleic acids are NOT DETECTED.  The SARS-CoV-2 RNA is generally detectable in upper respiratory specimens during the acute phase of infection. The lowest concentration of SARS-CoV-2 viral copies this assay can detect is 138 copies/mL. A negative result does not preclude SARS-Cov-2 infection and should not be used as the sole basis for treatment or other patient management decisions. A negative result may occur with  improper specimen collection/handling, submission of specimen other than nasopharyngeal swab, presence of viral mutation(s) within the areas targeted by this assay, and inadequate number of viral copies(<138 copies/mL). A negative result must be combined with clinical observations, patient history, and epidemiological information. The expected result is Negative.  Fact Sheet for Patients:   EntrepreneurPulse.com.au  Fact Sheet for Healthcare Providers:  IncredibleEmployment.be  This test is no t yet approved or cleared by the [place] FDA and  has been authorized for detection and/or diagnosis of SARS-CoV-2 by FDA under an Emergency Use Authorization (EUA). This EUA will remain  in effect (meaning this test can be used) for the duration of the COVID-19 declaration under Section 564(b)(1) of the Act, 21 U.S.C.section 360bbb-3(b)(1), unless the authorization is terminated  or revoked sooner.       Influenza A by PCR NEGATIVE NEGATIVE Final   Influenza B by PCR NEGATIVE NEGATIVE Final  Comment: (NOTE) The Xpert Xpress SARS-CoV-2/FLU/RSV plus assay is intended as an aid in the diagnosis of influenza from Nasopharyngeal swab specimens and should not be used as a sole basis for treatment. Nasal washings and aspirates are unacceptable for Xpert Xpress SARS-CoV-2/FLU/RSV testing.  Fact Sheet for Patients: EntrepreneurPulse.com.au  Fact Sheet for Healthcare Providers: IncredibleEmployment.be  This test is not yet approved or cleared by the [place] FDA and has been authorized for detection and/or diagnosis of SARS-CoV-2 by FDA under an Emergency Use Authorization (EUA). This EUA will remain in effect (meaning this test can be used) for the duration of the COVID-19 declaration under Section 564(b)(1) of the Act, 21 U.S.C. section 360bbb-3(b)(1), unless the authorization is terminated or revoked.  Performed at [facility], [address] [place], [place] [address]   Gastrointestinal Panel by PCR , Stool     Status: Abnormal   Collection Time: 08/07/20  6:22 AM   Specimen: Stool  Result Value Ref Range Status   Campylobacter species NOT DETECTED NOT DETECTED Final   Plesimonas shigelloides NOT DETECTED NOT DETECTED Final   Salmonella species NOT DETECTED NOT DETECTED Final    Yersinia enterocolitica NOT DETECTED NOT DETECTED Final   Vibrio species NOT DETECTED NOT DETECTED Final   Vibrio cholerae NOT DETECTED NOT DETECTED Final   Enteroaggregative E coli (EAEC) NOT DETECTED NOT DETECTED Final   Enteropathogenic E coli (EPEC) NOT DETECTED NOT DETECTED Final   Enterotoxigenic E coli (ETEC) DETECTED (A) NOT DETECTED Final    Comment: RESULT CALLED TO, READ BACK BY AND VERIFIED WITH: Everett Graff [id number] 08/07/20 GM    Shiga like toxin producing E coli (STEC) NOT DETECTED NOT DETECTED Final   Shigella/Enteroinvasive E coli (EIEC) NOT DETECTED NOT DETECTED Final   Cryptosporidium NOT DETECTED NOT DETECTED Final   Cyclospora cayetanensis NOT DETECTED NOT DETECTED Final   Entamoeba histolytica NOT DETECTED NOT DETECTED Final   Giardia lamblia NOT DETECTED NOT DETECTED Final   Adenovirus F40/41 NOT DETECTED NOT DETECTED Final   Astrovirus NOT DETECTED NOT DETECTED Final   Norovirus GI/GII NOT DETECTED NOT DETECTED Final   Rotavirus A NOT DETECTED NOT DETECTED Final   Sapovirus (I, II, IV, and V) NOT DETECTED NOT DETECTED Final    Comment: Performed at [facility], [address], [place], [place] [address]     Signed: Terrilee [facility]  [facility] 08/08/2020, 11:38 AM

## 2020-08-08 NOTE — Progress Notes (Signed)
Instructed by infection prevention, [name], that as long as pt is continent of stool he can come off enteric precautions. Pt is continent of stool. Enteric precautions discontinued.

## 2021-01-24 ENCOUNTER — Emergency Department (HOSPITAL_COMMUNITY): Payer: No Typology Code available for payment source

## 2021-01-24 ENCOUNTER — Other Ambulatory Visit: Payer: Self-pay

## 2021-01-24 ENCOUNTER — Encounter (HOSPITAL_COMMUNITY): Payer: Self-pay

## 2021-01-24 ENCOUNTER — Emergency Department (HOSPITAL_COMMUNITY)
Admission: EM | Admit: 2021-01-24 | Discharge: 2021-01-24 | Disposition: A | Discharge status: Home / Self Care | Payer: No Typology Code available for payment source | Attending: Emergency Medicine | Admitting: Emergency Medicine

## 2021-01-24 DIAGNOSIS — Z7984 Long term (current) use of oral hypoglycemic drugs: Secondary | ICD-10-CM | POA: Diagnosis not present

## 2021-01-24 DIAGNOSIS — E119 Type 2 diabetes mellitus without complications: Secondary | ICD-10-CM | POA: Diagnosis not present

## 2021-01-24 DIAGNOSIS — I2510 Atherosclerotic heart disease of native coronary artery without angina pectoris: Secondary | ICD-10-CM | POA: Insufficient documentation

## 2021-01-24 DIAGNOSIS — R059 Cough, unspecified: Secondary | ICD-10-CM | POA: Diagnosis present

## 2021-01-24 DIAGNOSIS — J101 Influenza due to other identified influenza virus with other respiratory manifestations: Secondary | ICD-10-CM | POA: Insufficient documentation

## 2021-01-24 DIAGNOSIS — J45909 Unspecified asthma, uncomplicated: Secondary | ICD-10-CM | POA: Diagnosis not present

## 2021-01-24 DIAGNOSIS — Z20822 Contact with and (suspected) exposure to covid-19: Secondary | ICD-10-CM | POA: Diagnosis not present

## 2021-01-24 DIAGNOSIS — Z87891 Personal history of nicotine dependence: Secondary | ICD-10-CM | POA: Diagnosis not present

## 2021-01-24 DIAGNOSIS — Z79899 Other long term (current) drug therapy: Secondary | ICD-10-CM | POA: Diagnosis not present

## 2021-01-24 DIAGNOSIS — I10 Essential (primary) hypertension: Secondary | ICD-10-CM | POA: Insufficient documentation

## 2021-01-24 DIAGNOSIS — R519 Headache, unspecified: Secondary | ICD-10-CM

## 2021-01-24 DIAGNOSIS — Z955 Presence of coronary angioplasty implant and graft: Secondary | ICD-10-CM | POA: Diagnosis not present

## 2021-01-24 DIAGNOSIS — Z794 Long term (current) use of insulin: Secondary | ICD-10-CM | POA: Insufficient documentation

## 2021-01-24 DIAGNOSIS — Z8546 Personal history of malignant neoplasm of prostate: Secondary | ICD-10-CM | POA: Insufficient documentation

## 2021-01-24 DIAGNOSIS — Z7982 Long term (current) use of aspirin: Secondary | ICD-10-CM | POA: Diagnosis not present

## 2021-01-24 DIAGNOSIS — J111 Influenza due to unidentified influenza virus with other respiratory manifestations: Secondary | ICD-10-CM

## 2021-01-24 LAB — COMPREHENSIVE METABOLIC PANEL
ALT: 15 U/L (ref 0–44)
AST: 22 U/L (ref 15–41)
Albumin: 3.9 g/dL (ref 3.5–5.0)
Alkaline Phosphatase: 43 U/L (ref 38–126)
Anion gap: 9 (ref 5–15)
BUN: 9 mg/dL (ref 8–23)
CO2: 29 mmol/L (ref 22–32)
Calcium: 9.1 mg/dL (ref 8.9–10.3)
Chloride: 102 mmol/L (ref 98–111)
Creatinine, Ser: 1.2 mg/dL (ref 0.61–1.24)
GFR, Estimated: >60 mL/min (ref >60)
Glucose, Bld: 159 mg/dL — ABNORMAL HIGH (ref 70–99)
Potassium: 3.4 mmol/L — ABNORMAL LOW (ref 3.5–5.1)
Sodium: 140 mmol/L (ref 135–145)
Total Bilirubin: 0.9 mg/dL (ref 0.3–1.2)
Total Protein: 6.7 g/dL (ref 6.5–8.1)

## 2021-01-24 LAB — CBC WITH DIFFERENTIAL/PLATELET
Abs Immature Granulocytes: 0.04 10*3/uL (ref 0.00–0.07)
Basophils Absolute: 0 10*3/uL (ref 0.0–0.1)
Basophils Relative: 0 %
Eosinophils Absolute: 0.1 10*3/uL (ref 0.0–0.5)
Eosinophils Relative: 2 %
HCT: 40 % (ref 39.0–52.0)
Hemoglobin: 13.3 g/dL (ref 13.0–17.0)
Immature Granulocytes: 1 %
Lymphocytes Relative: 9 %
Lymphs Abs: 0.7 10*3/uL (ref 0.7–4.0)
MCH: 29.2 pg (ref 26.0–34.0)
MCHC: 33.3 g/dL (ref 30.0–36.0)
MCV: 87.9 fL (ref 80.0–100.0)
Monocytes Absolute: 0.9 10*3/uL (ref 0.1–1.0)
Monocytes Relative: 12 %
Neutro Abs: 5.7 10*3/uL (ref 1.7–7.7)
Neutrophils Relative %: 76 %
Platelets: 151 10*3/uL (ref 150–400)
RBC: 4.55 MIL/uL (ref 4.22–5.81)
RDW: 13.2 % (ref 11.5–15.5)
WBC: 7.5 10*3/uL (ref 4.0–10.5)
nRBC: 0 % (ref 0.0–0.2)

## 2021-01-24 LAB — RESP PANEL BY RT-PCR (FLU A&B, COVID) ARPGX2
Influenza A by PCR: POSITIVE — AB
Influenza B by PCR: NEGATIVE
SARS Coronavirus 2 by RT PCR: NEGATIVE

## 2021-01-24 MED ORDER — HYDRALAZINE HCL 25 MG PO TABS
25.0000 mg | ORAL_TABLET | Freq: Once | ORAL | Status: AC
  Administered 2021-01-24: 25 mg via ORAL
  Filled 2021-01-24: qty 1

## 2021-01-24 MED ORDER — OSELTAMIVIR PHOSPHATE 75 MG PO CAPS: 75.0000 mg | ORAL_CAPSULE | Freq: Two times a day (BID) | ORAL | 0 refills | Status: AC

## 2021-01-24 NOTE — ED Triage Notes (Signed)
Pt presents to ED with complaints of cough, sneezing, runny nose, dizziness, and headache started yesterday. Pt states his BP systolic was 173 through the night also

## 2021-01-24 NOTE — Discharge Instructions (Addendum)
Tylenol every 4 hours for fever and headache.  Return if any problems. See your Physicain for recheck of your blood pressure

## 2021-01-24 NOTE — ED Provider Notes (Addendum)
[facility] EMERGENCY DEPARTMENT Provider Note   CSN: [id number] Arrival date & time: 01/24/21  1237     History Chief Complaint  Patient presents with   Cough    [name] is a 60 y.o. male.  The history is provided by the patient. No language interpreter was used.  Cough Cough characteristics:  Non-productive Sputum characteristics:  Nondescript Severity:  Moderate Duration:  1 day Timing:  Constant Progression:  Worsening Chronicity:  New Smoker: no   Context: upper respiratory infection   Relieved by:  Nothing Worsened by:  Nothing Ineffective treatments:  None tried Associated symptoms: headaches and rhinorrhea       Past Medical History:  Diagnosis Date   Asthma    Cancer ([place])    prostate   Chronic back pain    Chronic neck pain    Cirrhosis of liver ([place])    Complicated migraine 9/70/2637   Coronary artery disease    Depression    Diabetes mellitus without complication ([place])    Gallstones    Headache(784.0)    Hepatitis C    Hypercholesteremia    Hypertension    Left leg paresthesias    Myocardial infarction ([place])    Stroke [facility])    TIA   Vertigo     Patient Active Problem List   Diagnosis Date Noted   Type 2 diabetes mellitus with hyperlipidemia ([place]) 08/08/2020   Orthostatic syncope 08/05/2020   COVID-19 virus infection 04/03/2019   Sepsis ([place])    Unstable angina ([place]) 04/28/2017   Encounter for screening for upper gastrointestinal disorder    Columnar epithelial-lined lower esophagus    Secondary esophageal varices without bleeding ([place])    Encounter for screening colonoscopy    Rectum inflammation    Hematochezia    Syncope, near 01/24/2017   Hypotension 10/11/2012   CAD (coronary artery disease) 10/11/2012   S/P CABG (coronary artery bypass graft) 10/11/2012   Cervical spondylosis 09/22/2011   Neck pain 09/22/2011   Syncope 06/20/2011   ARF (acute renal failure) ([place]) 85/88/5027   Complicated migraine 74/12/8784    Hypertension    Hepatitis C    Cirrhosis of liver ([facility])    Depression    Chronic back pain    Left leg paresthesias    Hypercholesteremia    Chronic neck pain    Headache(784.0)     Past Surgical History:  Procedure Laterality Date   BYPASS GRAFT     triple bypass surgery   CARDIAC SURGERY     COLONOSCOPY WITH PROPOFOL N/A 02/15/2017   Procedure: COLONOSCOPY WITH PROPOFOL;  Surgeon: Virgel Manifold, MD;  Location: [place];  Service: Endoscopy;  Laterality: N/A;   CORONARY ARTERY BYPASS GRAFT     ESOPHAGOGASTRODUODENOSCOPY (EGD) WITH PROPOFOL N/A 02/15/2017   Procedure: ESOPHAGOGASTRODUODENOSCOPY (EGD) WITH PROPOFOL;  Surgeon: Virgel Manifold, MD;  Location: [place];  Service: Endoscopy;  Laterality: N/A;   HAND SURGERY  1984   LEFT HEART CATH AND CORONARY ANGIOGRAPHY Left 04/29/2017   Procedure: LEFT HEART CATH AND CORONARY ANGIOGRAPHY;  Surgeon: Dionisio David, MD;  Location: [place] [facility];  Service: Cardiovascular;  Laterality: Left;   LEFT HEART CATH AND CORONARY ANGIOGRAPHY Left 01/08/2019   Procedure: LEFT HEART CATH AND CORONARY ANGIOGRAPHY;  Surgeon: Dionisio David, MD;  Location: [place] [facility];  Service: Cardiovascular;  Laterality: Left;   ORTHOPEDIC SURGERY     "on my legs"       Family  History  Problem Relation Age of Onset   Diabetes Brother    Diabetes Sister    Diabetes Father    Hypertension Father    Diabetes Mother    Hypertension Mother    Hypertension Sister    Hypertension Brother     Social History   Tobacco Use   Smoking status: Former    Packs/day: 1.00    Years: 35.00    Pack years: 35.00    Types: Cigarettes    Quit date: 01/25/2009    Years since quitting: 12.0   Smokeless tobacco: Never  Vaping Use   Vaping Use: Never used  Substance Use Topics   Alcohol use: No    Comment: former alcoholic   Drug use: No    Comment: former cocaine abuse    Home Medications Prior to Admission  medications   Medication Sig Start Date End Date Taking? Authorizing Provider  albuterol (VENTOLIN HFA) 108 (90 Base) MCG/ACT inhaler Inhale 2 puffs into the lungs every 6 (six) hours as needed for wheezing or shortness of breath. 04/05/19   Manuella Ghazi, Pratik D, DO  aspirin EC 81 MG tablet Take 81 mg by mouth daily.    [provider]  Calcium Carbonate-Vitamin D 600-400 MG-UNIT tablet Take 1 tablet by mouth 2 (two) times daily.     [provider]  finasteride (PROSCAR) 5 MG tablet Take 1 tablet by mouth daily. 03/31/20   [provider]  fluticasone (FLONASE) 50 MCG/ACT nasal spray Place 2 sprays into both nostrils daily as needed for allergies or rhinitis.    [provider]  furosemide (LASIX) 20 MG tablet Take 1 tablet (20 mg total) by mouth daily as needed for fluid or edema. 08/08/20 09/07/20  Terrilee Croak, MD  insulin glargine (LANTUS) 100 UNIT/ML injection Inject 0.25 mLs (25 Units total) into the skin at bedtime. 08/08/20   Dahal, Marlowe Aschoff, MD  latanoprost (XALATAN) 0.005 % ophthalmic solution Place 1 drop into both eyes at bedtime.    [provider]  lisinopril (ZESTRIL) 2.5 MG tablet Take 1 tablet (2.5 mg total) by mouth daily. 08/08/20 09/07/20  Terrilee Croak, MD  magnesium oxide (MAG-OX) 400 MG tablet Take 400 mg by mouth 2 (two) times daily.    [provider]  metFORMIN (GLUCOPHAGE) 1000 MG tablet Take 1,000 mg by mouth 2 (two) times daily with a meal. 03/31/20   [provider]  metoprolol succinate (TOPROL-XL) 25 MG 24 hr tablet Take 25 mg by mouth daily.    [provider]  mirtazapine (REMERON) 7.5 MG tablet Take 7.5 mg by mouth at bedtime.    [provider]  pantoprazole (PROTONIX) 40 MG tablet Take 1 tablet by mouth daily. 07/11/19   [provider]  ranolazine (RANEXA) 1000 MG SR tablet Take 1,000 mg by mouth 2 (two) times daily. 08/13/19   [provider]  rosuvastatin (CRESTOR) 20 MG tablet  Take 20 mg by mouth daily.     [provider]  tamsulosin (FLOMAX) 0.4 MG CAPS capsule Take 1 capsule (0.4 mg total) by mouth daily after breakfast. 08/08/20   Dahal, Marlowe Aschoff, MD  terazosin (HYTRIN) 2 MG capsule Take 4 mg by mouth at bedtime.    [provider]  venlafaxine XR (EFFEXOR-XR) 75 MG 24 hr capsule Take 225 mg by mouth daily with breakfast.    [provider]    Allergies    Bee venom and Questran [cholestyramine]  Review of Systems   Review  of Systems  HENT:  Positive for rhinorrhea.   Respiratory:  Positive for cough.   Neurological:  Positive for headaches.  All other systems reviewed and are negative.  Physical Exam Updated Vital Signs BP (!) 167/95    Pulse 89    Temp 99.6 F (37.6 C) (Oral)    Resp 15    Ht 6\' 3"  (1.905 m)    Wt 99.8 kg    SpO2 98%    BMI 27.50 kg/m   Physical Exam Vitals and nursing note reviewed.  Constitutional:      Appearance: He is well-developed.  HENT:     Head: Normocephalic.     Nose: Nose normal.     Mouth/Throat:     Mouth: Mucous membranes are moist.  Eyes:     Extraocular Movements: Extraocular movements intact.     Pupils: Pupils are equal, round, and reactive to light.  Cardiovascular:     Rate and Rhythm: Normal rate.     Heart sounds: Normal heart sounds.  Pulmonary:     Effort: Pulmonary effort is normal.  Abdominal:     General: There is no distension.     Palpations: Abdomen is soft.  Musculoskeletal:        General: No swelling. Normal range of motion.     Cervical back: Normal range of motion.  Skin:    General: Skin is warm.  Neurological:     Mental Status: He is alert and oriented to person, place, and time.  Psychiatric:        Mood and Affect: Mood normal.    ED Results / Procedures / Treatments   Labs (all labs ordered are listed, but only abnormal results are displayed) Labs Reviewed  RESP PANEL BY RT-PCR (FLU A&B, COVID) ARPGX2 - Abnormal; Notable for the following  components:      Result Value   Influenza A by PCR POSITIVE (*)    All other components within normal limits  COMPREHENSIVE METABOLIC PANEL - Abnormal; Notable for the following components:   Potassium 3.4 (*)    Glucose, Bld 159 (*)    All other components within normal limits  CBC WITH DIFFERENTIAL/PLATELET    EKG None  Radiology DG Chest Port 1 View  Result Date: 01/24/2021 CLINICAL DATA:  60 year old male with history of cough, sneezing, runny nose and dizziness. EXAM: PORTABLE CHEST 1 VIEW COMPARISON:  Chest x-ray 12/19/2019. FINDINGS: Lung volumes are normal. No consolidative airspace disease. No pleural effusions. No pneumothorax. No pulmonary nodule or mass noted. Pulmonary vasculature and the cardiomediastinal silhouette are within normal limits. Atherosclerosis in the thoracic aorta. Status post median sternotomy for CABG. IMPRESSION: 1.  No radiographic evidence of acute cardiopulmonary disease. 2. Aortic atherosclerosis. Electronically Signed   By: Vinnie Langton M.D.   On: 01/24/2021 14:16    Procedures Procedures   Medications Ordered in ED Medications  hydrALAZINE (APRESOLINE) tablet 25 mg (25 mg Oral Given 01/24/21 1353)    ED Course  I have reviewed the triage vital signs and the nursing notes.  Pertinent labs & imaging results that were available during my care of the patient were reviewed by me and considered in my medical decision making (see chart for details).    MDM Rules/Calculators/A&P                         Medical Decision Making Pt has an elevated blood pressure, runny nose and cough,  Pt has a  past medical history of hypertension   Amount and/or Complexity of Data Reviewed Labs: ordered. Decision-making details documented in ED Course.    Details: Influenza A positive. Radiology: ordered and independent interpretation performed. Decision-making details documented in ED Course.    Details: No acute abnormality, no  pneumonia  Risk Prescription drug management.       Final Clinical Impression(s) / ED Diagnoses Final diagnoses:  Influenza  Hypertension, unspecified type  Nonintractable headache, unspecified chronicity pattern, unspecified headache type    Rx / DC Orders ED Discharge Orders          Ordered    oseltamivir (TAMIFLU) 75 MG capsule  2 times daily        01/24/21 1529             Sidney Ace 01/24/21 1532    Sidney Ace 01/24/21 1532    Milton Ferguson, MD 01/27/21 [phone]

## 2021-02-16 ENCOUNTER — Ambulatory Visit: Payer: Self-pay | Admitting: Cardiology

## 2021-02-16 DIAGNOSIS — R079 Chest pain, unspecified: Secondary | ICD-10-CM | POA: Insufficient documentation

## 2021-02-16 MED ORDER — SODIUM CHLORIDE 0.9% FLUSH
3.0000 mL | Freq: Two times a day (BID) | INTRAVENOUS | Status: DC
  Filled 2021-02-16: qty 3

## 2021-02-19 ENCOUNTER — Encounter
Admission: RE | Disposition: A | Discharge status: Home / Self Care | Payer: Self-pay | Source: Home / Self Care | Attending: Cardiovascular Disease

## 2021-02-19 ENCOUNTER — Ambulatory Visit
Admission: RE | Admit: 2021-02-19 | Discharge: 2021-02-19 | Disposition: A | Discharge status: Home / Self Care | Payer: Medicaid Other | Attending: Cardiovascular Disease | Admitting: Cardiovascular Disease

## 2021-02-19 ENCOUNTER — Other Ambulatory Visit: Payer: Self-pay

## 2021-02-19 ENCOUNTER — Encounter: Payer: Self-pay | Admitting: Cardiovascular Disease

## 2021-02-19 DIAGNOSIS — I2582 Chronic total occlusion of coronary artery: Secondary | ICD-10-CM | POA: Insufficient documentation

## 2021-02-19 DIAGNOSIS — I25810 Atherosclerosis of coronary artery bypass graft(s) without angina pectoris: Secondary | ICD-10-CM | POA: Diagnosis not present

## 2021-02-19 DIAGNOSIS — R079 Chest pain, unspecified: Secondary | ICD-10-CM | POA: Diagnosis present

## 2021-02-19 HISTORY — PX: LEFT HEART CATH AND CORONARY ANGIOGRAPHY: CATH118249

## 2021-02-19 LAB — GLUCOSE, CAPILLARY
Glucose-Capillary: 101 mg/dL — ABNORMAL HIGH (ref 70–99)
Glucose-Capillary: 68 mg/dL — ABNORMAL LOW (ref 70–99)
Glucose-Capillary: 226 mg/dL — ABNORMAL HIGH (ref 70–99)

## 2021-02-19 SURGERY — LEFT HEART CATH AND CORONARY ANGIOGRAPHY
Anesthesia: Moderate Sedation

## 2021-02-19 MED ORDER — ASPIRIN 81 MG PO CHEW
CHEWABLE_TABLET | ORAL | Status: AC
  Administered 2021-02-19: 81 mg via ORAL
  Filled 2021-02-19: qty 1

## 2021-02-19 MED ORDER — PANTOPRAZOLE SODIUM 40 MG PO TBEC
40.0000 mg | DELAYED_RELEASE_TABLET | ORAL | Status: AC
  Administered 2021-02-19: 40 mg via ORAL
  Filled 2021-02-19: qty 1

## 2021-02-19 MED ORDER — ASPIRIN 81 MG PO CHEW: 81.0000 mg | CHEWABLE_TABLET | ORAL | Status: AC

## 2021-02-19 MED ORDER — SODIUM CHLORIDE 0.9 % WEIGHT BASED INFUSION
1.0000 mL/kg/h | INTRAVENOUS | Status: DC
  Administered 2021-02-19: 1 mL/kg/h via INTRAVENOUS

## 2021-02-19 MED ORDER — IOHEXOL 300 MG/ML  SOLN
INTRAMUSCULAR | Status: DC | PRN
  Administered 2021-02-19: 120 mL

## 2021-02-19 MED ORDER — MIDAZOLAM HCL 2 MG/2ML IJ SOLN
INTRAMUSCULAR | Status: DC | PRN
  Administered 2021-02-19: 1 mg via INTRAVENOUS

## 2021-02-19 MED ORDER — PANTOPRAZOLE SODIUM 40 MG IV SOLR
40.0000 mg | Freq: Once | INTRAVENOUS | Status: DC
  Filled 2021-02-19: qty 40

## 2021-02-19 MED ORDER — LABETALOL HCL 5 MG/ML IV SOLN
INTRAVENOUS | Status: AC
  Filled 2021-02-19: qty 4

## 2021-02-19 MED ORDER — HYDRALAZINE HCL 20 MG/ML IJ SOLN
INTRAMUSCULAR | Status: DC | PRN
  Administered 2021-02-19 (×2): 10 mg via INTRAVENOUS

## 2021-02-19 MED ORDER — MIDAZOLAM HCL 2 MG/2ML IJ SOLN
INTRAMUSCULAR | Status: AC
  Filled 2021-02-19: qty 2

## 2021-02-19 MED ORDER — SODIUM CHLORIDE 0.9% FLUSH: 3.0000 mL | INTRAVENOUS | Status: DC | PRN

## 2021-02-19 MED ORDER — FENTANYL CITRATE (PF) 100 MCG/2ML IJ SOLN
INTRAMUSCULAR | Status: AC
  Filled 2021-02-19: qty 2

## 2021-02-19 MED ORDER — FENTANYL CITRATE (PF) 100 MCG/2ML IJ SOLN
INTRAMUSCULAR | Status: DC | PRN
  Administered 2021-02-19: 50 ug via INTRAVENOUS

## 2021-02-19 MED ORDER — LABETALOL HCL 5 MG/ML IV SOLN
INTRAVENOUS | Status: DC | PRN
  Administered 2021-02-19: 20 mg via INTRAVENOUS

## 2021-02-19 MED ORDER — DEXTROSE 50 % IV SOLN
INTRAVENOUS | Status: AC
  Administered 2021-02-19: 50 mL via INTRAVENOUS
  Filled 2021-02-19: qty 50

## 2021-02-19 MED ORDER — HYDRALAZINE HCL 20 MG/ML IJ SOLN
INTRAMUSCULAR | Status: AC
  Filled 2021-02-19: qty 1

## 2021-02-19 MED ORDER — HEPARIN (PORCINE) IN NACL 1000-0.9 UT/500ML-% IV SOLN
INTRAVENOUS | Status: AC
  Filled 2021-02-19: qty 1000

## 2021-02-19 MED ORDER — HEPARIN (PORCINE) IN NACL 1000-0.9 UT/500ML-% IV SOLN
INTRAVENOUS | Status: DC | PRN
  Administered 2021-02-19: 1000 mL

## 2021-02-19 MED ORDER — SODIUM CHLORIDE 0.9 % WEIGHT BASED INFUSION
3.0000 mL/kg/h | INTRAVENOUS | Status: DC
  Administered 2021-02-19: 3 mL/kg/h via INTRAVENOUS

## 2021-02-19 MED ORDER — SODIUM CHLORIDE 0.9 % IV SOLN: 250.0000 mL | INTRAVENOUS | Status: DC | PRN

## 2021-02-19 MED ORDER — DEXTROSE 50 % IV SOLN: 1.0000 | Freq: Once | INTRAVENOUS | Status: AC

## 2021-02-19 SURGICAL SUPPLY — 14 items
CATH ANGIO 5F JB2 100CM (CATHETERS) ×1 IMPLANT
CATH INFINITI 5 FR IM (CATHETERS) ×1 IMPLANT
CATH INFINITI 5 FR MPA2 (CATHETERS) ×1 IMPLANT
CATH INFINITI 5FR MULTPACK ANG (CATHETERS) ×1 IMPLANT
DEVICE CLOSURE MYNXGRIP 5F (Vascular Products) ×1 IMPLANT
NDL PERC 18GX7CM (NEEDLE) IMPLANT
NEEDLE PERC 18GX7CM (NEEDLE) ×2 IMPLANT
PACK CARDIAC CATH (CUSTOM PROCEDURE TRAY) ×2 IMPLANT
PROTECTION STATION PRESSURIZED (MISCELLANEOUS) ×2
SET ATX SIMPLICITY (MISCELLANEOUS) ×1 IMPLANT
SHEATH AVANTI 5FR X 11CM (SHEATH) ×1 IMPLANT
STATION PROTECTION PRESSURIZED (MISCELLANEOUS) IMPLANT
WIRE EMERALD 3MM-J .035X260CM (WIRE) ×1 IMPLANT
WIRE GUIDERIGHT .035X150 (WIRE) ×3 IMPLANT

## 2021-02-19 NOTE — Progress Notes (Signed)
Patient's POCT CBG low (68). MD Humphrey Rolls notified. Eight ounces of orange juice given. One ampule of D50 given per MD order. RN will continue to monitor.

## 2021-02-20 ENCOUNTER — Encounter: Payer: Self-pay | Admitting: Cardiovascular Disease

## 2021-03-06 IMAGING — US US ABDOMEN COMPLETE
1 series · 13 of 25 positions shown · non-contrast
Comparison: Multiple exams, including CT scan 03/06/2018

CLINICAL DATA: Hepatic cirrhosis, screening for hepatocellular
carcinoma

EXAM:
ABDOMEN ULTRASOUND COMPLETE

[Series 1: us abdomen complete · 13 of 94 slices shown]
[im 1/94]
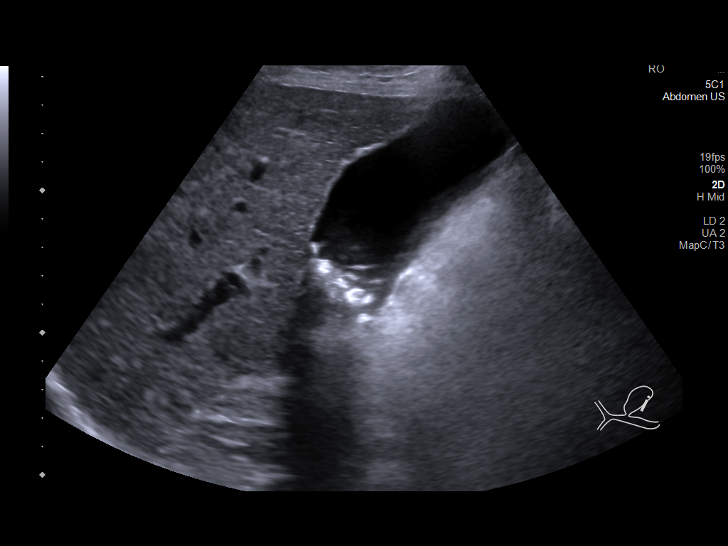
[im 8/94]
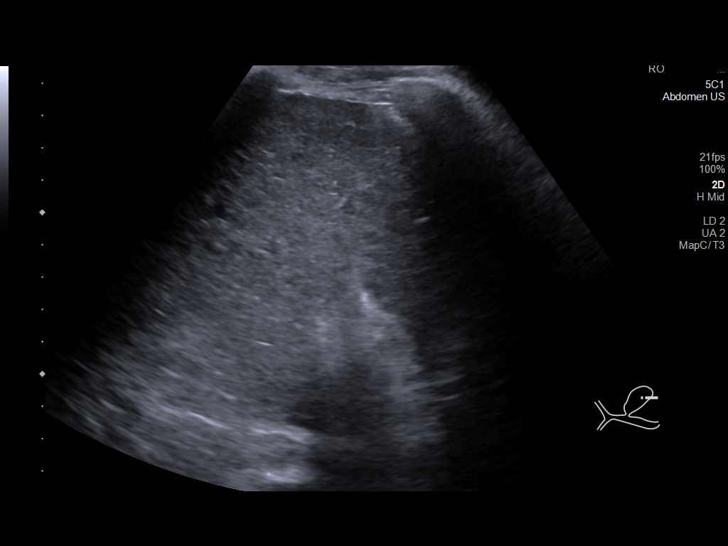
[im 16/94]
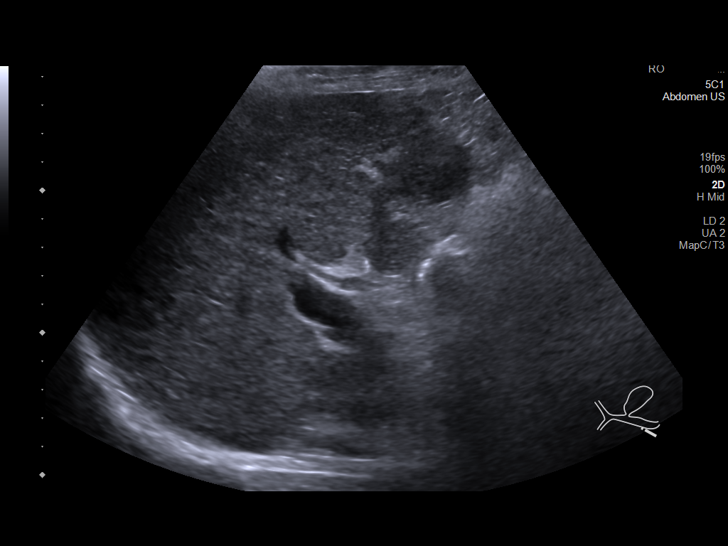
[im 24/94]
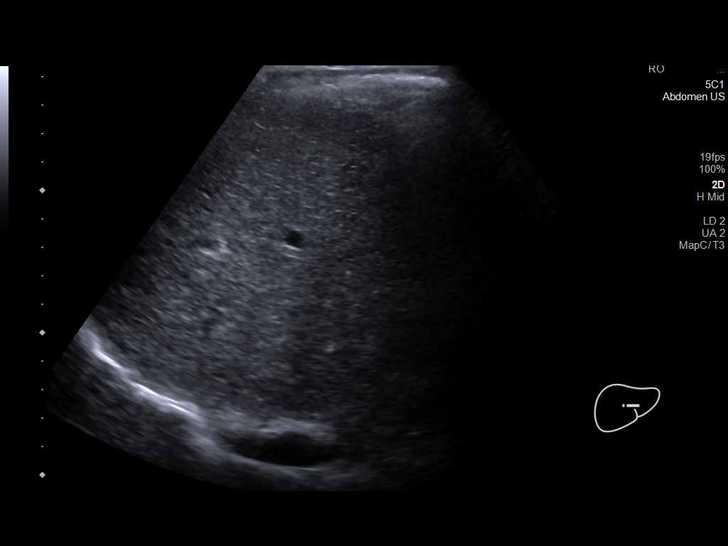
[im 32/94]
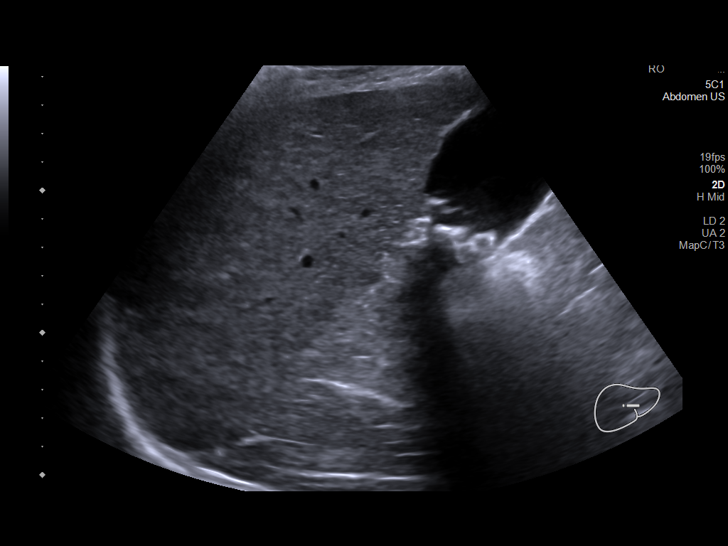
[im 39/94]
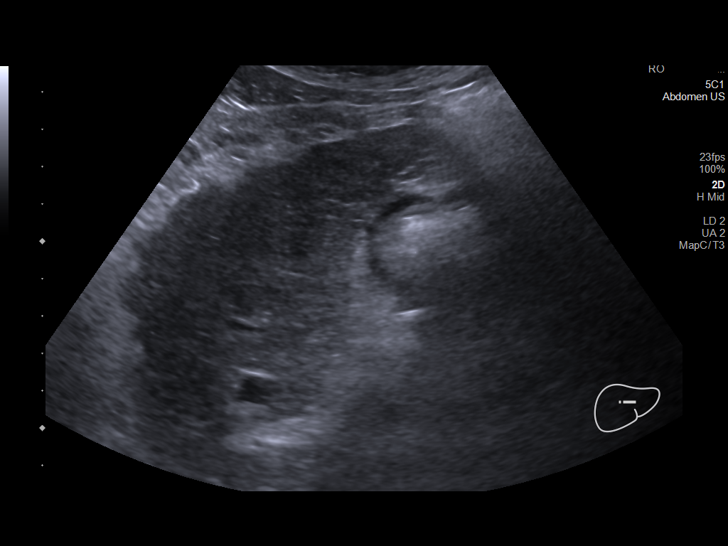
[im 47/94]
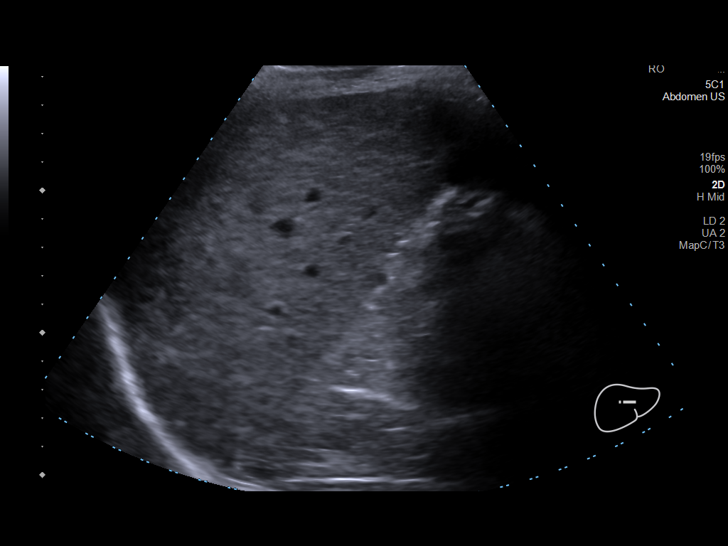
[im 55/94]
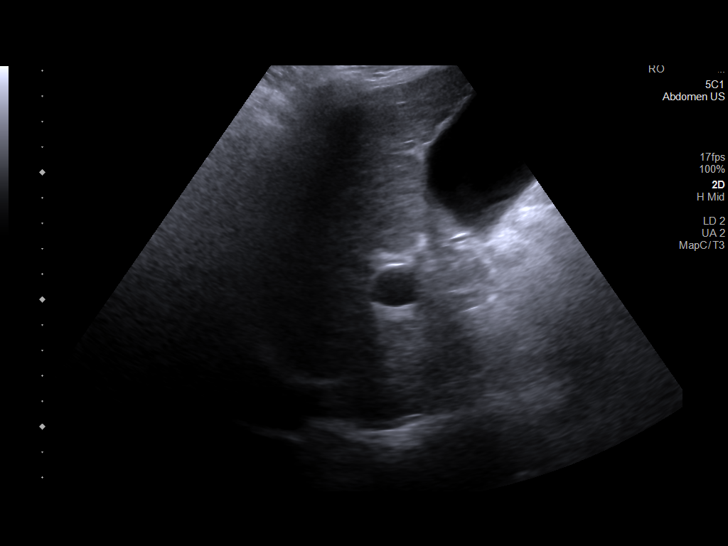
[im 63/94]
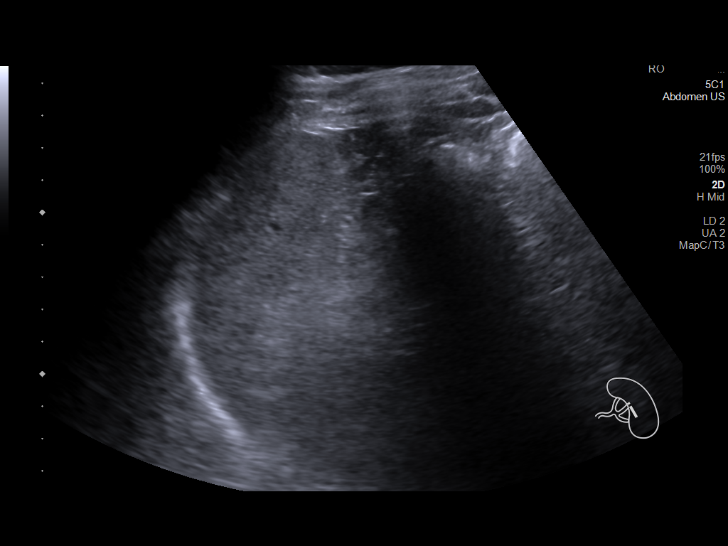
[im 70/94]
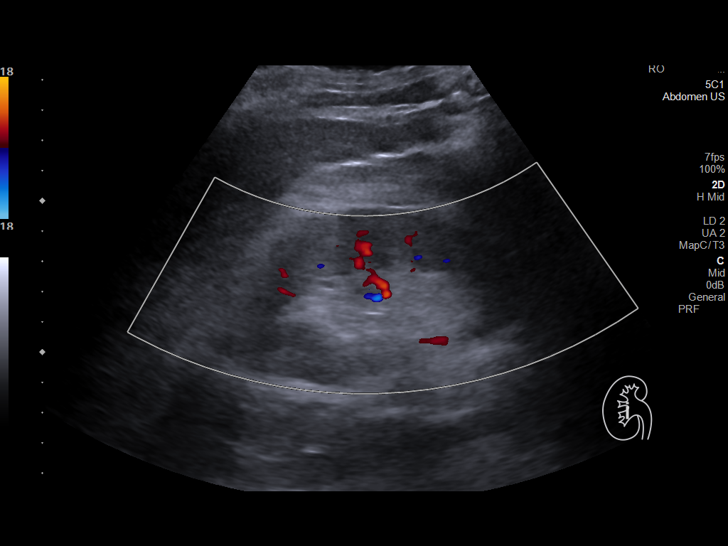
[im 78/94]
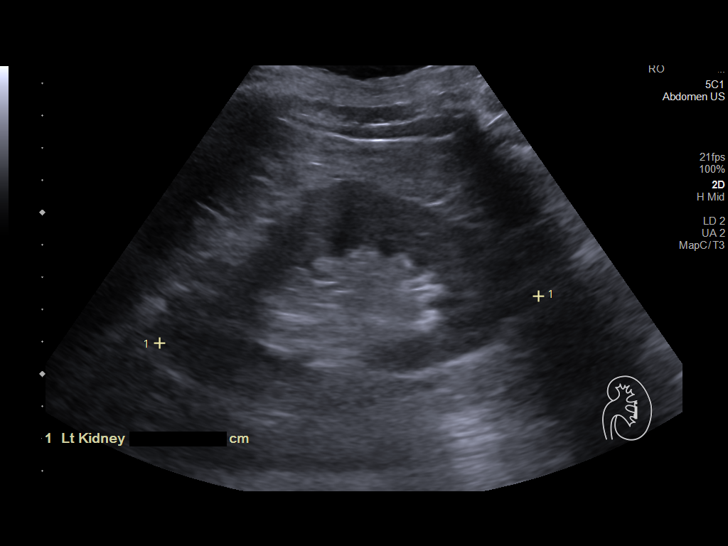
[im 86/94]
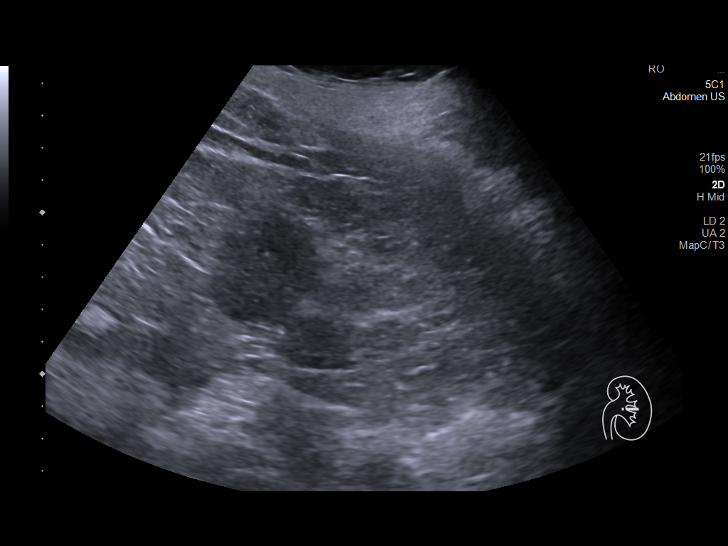
[im 94/94]
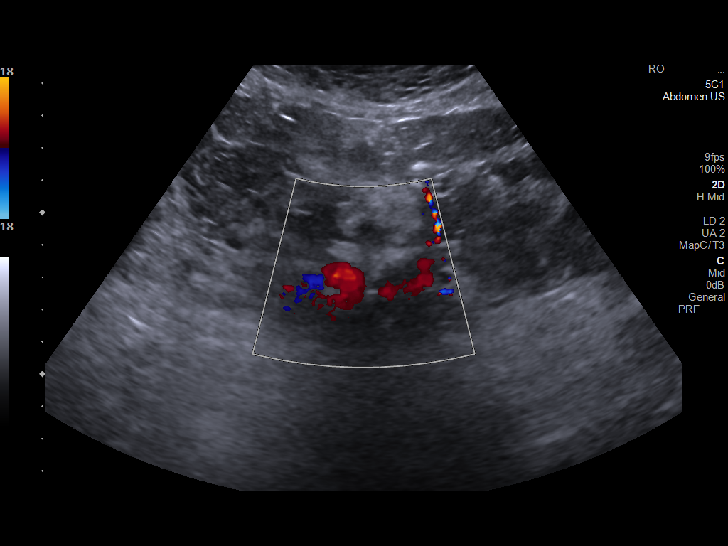

[13 of 25 positions shown; findings below may reference images not displayed]

FINDINGS: Gallbladder: Multiple gallstones measuring up to 0.9 cm in long
axis. Sonographic Murphy's sign absent. No significant gallbladder
wall thickening.

Common bile duct: Diameter: 0.5 cm

Liver: Contour nodularity and architectural distortion along with
mild hyperechogenicity of the parenchyma compatible with cirrhosis.
No discrete focal mass is identified to raise suspicion for
hepatocellular carcinoma. Portal vein is patent on color Doppler
imaging with normal direction of blood flow towards the liver.

IVC: Not visualized due to overlying bowel gas.

Pancreas: Visualized portion unremarkable. Pancreatic tail not well
seen due to overlying bowel gas.

Spleen: Size and appearance within normal limits.

Right Kidney: Length: 10.4 cm. Echogenicity within normal limits. No
mass or hydronephrosis visualized.

Left Kidney: Length: 11.8 cm. Echogenicity within normal limits. No
mass or hydronephrosis visualized.

Abdominal aorta: No aneurysm visualized.

Other findings: None.
IMPRESSION: 1. Hepatic cirrhosis. No focal liver mass is identified.
2. Cholelithiasis.
3. IVC and pancreatic tail not well seen due to overlying bowel gas.

## 2021-05-04 HISTORY — PX: KNEE ARTHROPLASTY: SHX992

## 2021-05-08 ENCOUNTER — Encounter (HOSPITAL_COMMUNITY): Payer: Self-pay

## 2021-05-08 ENCOUNTER — Emergency Department (HOSPITAL_COMMUNITY): Payer: Medicaid Other

## 2021-05-08 ENCOUNTER — Emergency Department (HOSPITAL_COMMUNITY)
Admission: EM | Admit: 2021-05-08 | Discharge: 2021-05-08 | Disposition: A | Discharge status: Home / Self Care | Payer: Medicaid Other | Attending: Emergency Medicine | Admitting: Emergency Medicine

## 2021-05-08 ENCOUNTER — Other Ambulatory Visit: Payer: Self-pay

## 2021-05-08 DIAGNOSIS — Z96652 Presence of left artificial knee joint: Secondary | ICD-10-CM | POA: Insufficient documentation

## 2021-05-08 DIAGNOSIS — Z4801 Encounter for change or removal of surgical wound dressing: Secondary | ICD-10-CM | POA: Insufficient documentation

## 2021-05-08 DIAGNOSIS — Z5189 Encounter for other specified aftercare: Secondary | ICD-10-CM

## 2021-05-08 DIAGNOSIS — Z7982 Long term (current) use of aspirin: Secondary | ICD-10-CM | POA: Diagnosis not present

## 2021-05-08 DIAGNOSIS — R739 Hyperglycemia, unspecified: Secondary | ICD-10-CM | POA: Diagnosis not present

## 2021-05-08 DIAGNOSIS — M25562 Pain in left knee: Secondary | ICD-10-CM

## 2021-05-08 LAB — CBC WITH DIFFERENTIAL/PLATELET
Abs Immature Granulocytes: 0.08 10*3/uL — ABNORMAL HIGH (ref 0.00–0.07)
Basophils Absolute: 0 10*3/uL (ref 0.0–0.1)
Basophils Relative: 0 %
Eosinophils Absolute: 0.4 10*3/uL (ref 0.0–0.5)
Eosinophils Relative: 5 %
HCT: 32 % — ABNORMAL LOW (ref 39.0–52.0)
Hemoglobin: 10.4 g/dL — ABNORMAL LOW (ref 13.0–17.0)
Immature Granulocytes: 1 %
Lymphocytes Relative: 14 %
Lymphs Abs: 1.3 10*3/uL (ref 0.7–4.0)
MCH: 29.5 pg (ref 26.0–34.0)
MCHC: 32.5 g/dL (ref 30.0–36.0)
MCV: 90.9 fL (ref 80.0–100.0)
Monocytes Absolute: 1 10*3/uL (ref 0.1–1.0)
Monocytes Relative: 11 %
Neutro Abs: 6.3 10*3/uL (ref 1.7–7.7)
Neutrophils Relative %: 69 %
Platelets: 174 10*3/uL (ref 150–400)
RBC: 3.52 MIL/uL — ABNORMAL LOW (ref 4.22–5.81)
RDW: 14.2 % (ref 11.5–15.5)
WBC: 9.1 10*3/uL (ref 4.0–10.5)
nRBC: 0 % (ref 0.0–0.2)

## 2021-05-08 LAB — BASIC METABOLIC PANEL
Anion gap: 6 (ref 5–15)
BUN: 17 mg/dL (ref 8–23)
CO2: 26 mmol/L (ref 22–32)
Calcium: 8.6 mg/dL — ABNORMAL LOW (ref 8.9–10.3)
Chloride: 106 mmol/L (ref 98–111)
Creatinine, Ser: 1.2 mg/dL (ref 0.61–1.24)
GFR, Estimated: >60 mL/min (ref >60)
Glucose, Bld: 135 mg/dL — ABNORMAL HIGH (ref 70–99)
Potassium: 3.5 mmol/L (ref 3.5–5.1)
Sodium: 138 mmol/L (ref 135–145)

## 2021-05-08 NOTE — ED Provider Notes (Signed)
?[facility] ?Provider Note ? ? ?CSN: [id number] ?Arrival date & time: 05/08/21  1856 ? ?  ? ?History ? ?Chief Complaint  ?Patient presents with  ? Knee Pain  ? ? ?MCCRAE SPECIALE is a 61 y.o. male. ? ?HPI ?He is here for evaluation of pain, swelling, drainage and bleeding from his left knee.  He had a total knee replacement done, 5 days ago at [facility].  He feels like the swelling in the left knee is more today than it was yesterday.  He saw some "white pus," and blood on the bandage, today. ?  ? ?Home Medications ?Prior to Admission medications   ?Medication Sig Start Date End Date Taking? Authorizing Provider  ?albuterol (VENTOLIN HFA) 108 (90 Base) MCG/ACT inhaler Inhale 2 puffs into the lungs every 6 (six) hours as needed for wheezing or shortness of breath. 04/05/19   Manuella Ghazi, Pratik D, DO  ?aspirin EC 81 MG tablet Take 81 mg by mouth daily.    [provider]  ?Calcium Carbonate-Vitamin D 600-400 MG-UNIT tablet Take 1 tablet by mouth 2 (two) times daily.     [provider]  ?famotidine (PEPCID) 40 MG tablet Take 40 mg by mouth 2 (two) times daily.    [provider]  ?finasteride (PROSCAR) 5 MG tablet Take 5 mg by mouth daily. 03/31/20   [provider]  ?fluticasone (FLONASE) 50 MCG/ACT nasal spray Place 2 sprays into both nostrils daily as needed for allergies or rhinitis.    [provider]  ?furosemide (LASIX) 20 MG tablet Take 1 tablet (20 mg total) by mouth daily as needed for fluid or edema. ?Patient not taking: Reported on 02/17/2021 08/08/20 09/07/20  Terrilee Croak, MD  ?Glycerin-Hypromellose-PEG 400 (DRY EYE RELIEF DROPS) 0.2-0.2-1 % SOLN Place 1 drop into both eyes daily as needed (Dry eye).    [provider]  ?insulin glargine (LANTUS) 100 UNIT/ML injection Inject 0.25 mLs (25 Units total) into the skin at bedtime. 08/08/20   Terrilee Croak, MD  ?lisinopril (ZESTRIL) 2.5 MG tablet Take 1 tablet (2.5 mg total) by mouth  daily. ?Patient not taking: Reported on 02/17/2021 08/08/20 09/07/20  Terrilee Croak, MD  ?magnesium oxide (MAG-OX) 400 MG tablet Take 400 mg by mouth daily. ?Patient not taking: Reported on 02/19/2021    [provider]  ?metFORMIN (GLUCOPHAGE) 1000 MG tablet Take 1,000 mg by mouth 2 (two) times daily with a meal. 03/31/20   [provider]  ?metoprolol succinate (TOPROL-XL) 25 MG 24 hr tablet Take 25 mg by mouth daily.    [provider]  ?pantoprazole (PROTONIX) 40 MG tablet Take 40 mg by mouth daily. 07/11/19   [provider]  ?ranolazine (RANEXA) 1000 MG SR tablet Take 1,000 mg by mouth 2 (two) times daily. 08/13/19   [provider]  ?rosuvastatin (CRESTOR) 20 MG tablet Take 20 mg by mouth daily.     [provider]  ?tamsulosin (FLOMAX) 0.4 MG CAPS capsule Take 1 capsule (0.4 mg total) by mouth daily after breakfast. 08/08/20   Dahal, Marlowe Aschoff, MD  ?venlafaxine XR (EFFEXOR-XR) 75 MG 24 hr capsule Take 225 mg by mouth daily with breakfast.    [provider]  ?   ? ?Allergies    ?Bee venom and Questran [cholestyramine]   ? ?Review of Systems   ?Review of Systems ? ?Physical Exam ?Updated Vital Signs ?BP (!) 189/101 (BP Location: Right Arm)   Pulse 77   Temp 98.4 ?F (36.9 ?  C) (Oral)   Resp 18   Ht '6\' 3"'$  (1.905 m)   Wt 91.2 kg   SpO2 100%   BMI 25.13 kg/m?  ?Physical Exam ? ?ED Results / Procedures / Treatments   ?Labs ?(all labs ordered are listed, but only abnormal results are displayed) ?Labs Reviewed - No data to display ? ?EKG ?None ? ?Radiology ?No results found. ? ?Procedures ?Procedures  ? ? ?Medications Ordered in ED ?Medications - No data to display ? ?ED Course/ Medical Decision Making/ A&P ?  ?                        ?Medical Decision Making ?Patient presenting for evaluation of pain, swelling, drainage and bleeding from his left knee wound, where he had a knee replacement done 5 days prior.  No systemic symptoms.  He is able to  ambulate. ? ?Problems Addressed: ?Acute pain of left knee: acute illness or injury ?   Details: Pain and swelling postoperatively ?Encounter for wound re-check: acute illness or injury ?   Details: Bleeding wound, not dehisced, no frank infection. ? ?Amount and/or Complexity of Data Reviewed ?Independent Historian:  ?   Details: He is a cogent historian ?Labs: ordered. ?   Details: Low CBC, metabolic panel-normal except hemoglobin slightly low, glucose mildly elevated, calcium low ?Radiology: ordered. ?   Details: Left knee x-ray-some air beneath the patellar tendon, effusion present, no fracture or hardware malfunction. ?Discussion of management or test interpretation with external provider(s): Case discussed with orthopedics, at [facility].  Their assessment is that this is a usual postop finding and does not indicate infection or requirement for intervention including drainage, aspiration, operative intervention. ? ?Risk ?Decision regarding hospitalization. ?Risk Details: Patient presenting with concern for knee infection.  Findings are reassuring.  No apparent knee infection, fracture or disruption of the hardware.  Patient is stable for discharge home with usual treatment and plan follow-up.  He is instructed to return or see his orthopedic further complications.  There is no indication for hospitalization at this time. ? ? ? ? ? ? ? ? ? ? ?Final Clinical Impression(s) / ED Diagnoses ?Final diagnoses:  ?Acute pain of left knee  ?Encounter for wound re-check  ? ? ?Rx / DC Orders ?ED Discharge Orders   ? ? None  ? ?  ? ? ?  ?Daleen Bo, MD ?05/09/21 1126 ? ?

## 2021-05-08 NOTE — Discharge Instructions (Signed)
We talked to the orthopedic doctor at the [facility].  He states that the drainage and bleeding that you had, is normal after total knee replacement.  Continue to care for the wound as usual.  Follow-up with your orthopedic doctor at the [facility] as currently scheduled.  Return here, if needed. ?

## 2021-05-08 NOTE — ED Triage Notes (Signed)
Pt arrived via POV c/o left knee pain, swelling, oozing from incision. Pt reports having left knee surgery on Monday at [facility].  ?

## 2021-05-18 ENCOUNTER — Encounter (HOSPITAL_COMMUNITY): Payer: Self-pay

## 2021-05-18 ENCOUNTER — Emergency Department (HOSPITAL_COMMUNITY)
Admission: EM | Admit: 2021-05-18 | Discharge: 2021-05-18 | Disposition: A | Discharge status: Home / Self Care | Payer: No Typology Code available for payment source | Attending: Emergency Medicine | Admitting: Emergency Medicine

## 2021-05-18 ENCOUNTER — Other Ambulatory Visit: Payer: Self-pay

## 2021-05-18 DIAGNOSIS — Z7984 Long term (current) use of oral hypoglycemic drugs: Secondary | ICD-10-CM | POA: Diagnosis not present

## 2021-05-18 DIAGNOSIS — Z79899 Other long term (current) drug therapy: Secondary | ICD-10-CM | POA: Diagnosis not present

## 2021-05-18 DIAGNOSIS — R030 Elevated blood-pressure reading, without diagnosis of hypertension: Secondary | ICD-10-CM

## 2021-05-18 DIAGNOSIS — Z7982 Long term (current) use of aspirin: Secondary | ICD-10-CM | POA: Diagnosis not present

## 2021-05-18 DIAGNOSIS — Z8546 Personal history of malignant neoplasm of prostate: Secondary | ICD-10-CM | POA: Insufficient documentation

## 2021-05-18 DIAGNOSIS — Z794 Long term (current) use of insulin: Secondary | ICD-10-CM | POA: Diagnosis not present

## 2021-05-18 DIAGNOSIS — E119 Type 2 diabetes mellitus without complications: Secondary | ICD-10-CM | POA: Diagnosis not present

## 2021-05-18 DIAGNOSIS — I10 Essential (primary) hypertension: Secondary | ICD-10-CM | POA: Insufficient documentation

## 2021-05-18 DIAGNOSIS — J45909 Unspecified asthma, uncomplicated: Secondary | ICD-10-CM | POA: Insufficient documentation

## 2021-05-18 DIAGNOSIS — I2510 Atherosclerotic heart disease of native coronary artery without angina pectoris: Secondary | ICD-10-CM | POA: Insufficient documentation

## 2021-05-18 DIAGNOSIS — M25562 Pain in left knee: Secondary | ICD-10-CM | POA: Insufficient documentation

## 2021-05-18 LAB — CBC WITH DIFFERENTIAL/PLATELET
Abs Immature Granulocytes: 0.14 10*3/uL — ABNORMAL HIGH (ref 0.00–0.07)
Basophils Absolute: 0 10*3/uL (ref 0.0–0.1)
Basophils Relative: 0 %
Eosinophils Absolute: 0.6 10*3/uL — ABNORMAL HIGH (ref 0.0–0.5)
Eosinophils Relative: 6 %
HCT: 35.4 % — ABNORMAL LOW (ref 39.0–52.0)
Hemoglobin: 11.5 g/dL — ABNORMAL LOW (ref 13.0–17.0)
Immature Granulocytes: 1 %
Lymphocytes Relative: 18 %
Lymphs Abs: 1.8 10*3/uL (ref 0.7–4.0)
MCH: 29 pg (ref 26.0–34.0)
MCHC: 32.5 g/dL (ref 30.0–36.0)
MCV: 89.4 fL (ref 80.0–100.0)
Monocytes Absolute: 0.6 10*3/uL (ref 0.1–1.0)
Monocytes Relative: 6 %
Neutro Abs: 6.7 10*3/uL (ref 1.7–7.7)
Neutrophils Relative %: 69 %
Platelets: 311 10*3/uL (ref 150–400)
RBC: 3.96 MIL/uL — ABNORMAL LOW (ref 4.22–5.81)
RDW: 13.6 % (ref 11.5–15.5)
WBC: 9.9 10*3/uL (ref 4.0–10.5)
nRBC: 0 % (ref 0.0–0.2)

## 2021-05-18 LAB — BASIC METABOLIC PANEL
Anion gap: 7 (ref 5–15)
BUN: 18 mg/dL (ref 8–23)
CO2: 27 mmol/L (ref 22–32)
Calcium: 8.9 mg/dL (ref 8.9–10.3)
Chloride: 104 mmol/L (ref 98–111)
Creatinine, Ser: 1.06 mg/dL (ref 0.61–1.24)
GFR, Estimated: >60 mL/min (ref >60)
Glucose, Bld: 139 mg/dL — ABNORMAL HIGH (ref 70–99)
Potassium: 4 mmol/L (ref 3.5–5.1)
Sodium: 138 mmol/L (ref 135–145)

## 2021-05-18 MED ORDER — AMLODIPINE BESYLATE 5 MG PO TABS
5.0000 mg | ORAL_TABLET | Freq: Every day | ORAL | 0 refills | Status: DC
Start: 2021-05-18 — End: 2022-07-07

## 2021-05-18 MED ORDER — AMLODIPINE BESYLATE 5 MG PO TABS
5.0000 mg | ORAL_TABLET | Freq: Once | ORAL | Status: AC
  Administered 2021-05-18: 5 mg via ORAL
  Filled 2021-05-18: qty 1

## 2021-05-18 NOTE — ED Provider Notes (Signed)
?[facility] ?Provider Note ? ? ?CSN: [id number] ?Arrival date & time: 05/18/21  1040 ? ?  ? ?History ? ?Chief Complaint  ?Patient presents with  ? Hypertension  ? ? ?[name] is a 61 y.o. male. ? ?61 year old male with past medical history of hypertension, recent left total knee replacement on 4/10 presents today for evaluation of elevated blood pressure.  Patient reports he was starting his first session of home physical therapy when his blood pressure was noted to be around 220/100s.  He initially contacted his primary care provider but was unable to get a hold of them and he was instructed to come into the emergency room for evaluation.  He denies chest pain, shortness of breath, lightheadedness, headache, visual change or any other complaints.  He does report left knee pain but no worse than his baseline since the surgery. ? ?The history is provided by the patient. No language interpreter was used.  ? ?  ? ?Home Medications ?Prior to Admission medications   ?Medication Sig Start Date End Date Taking? Authorizing Provider  ?albuterol (VENTOLIN HFA) 108 (90 Base) MCG/ACT inhaler Inhale 2 puffs into the lungs every 6 (six) hours as needed for wheezing or shortness of breath. 04/05/19   Manuella Ghazi, Pratik D, DO  ?aspirin EC 81 MG tablet Take 81 mg by mouth daily.    [provider]  ?Calcium Carbonate-Vitamin D 600-400 MG-UNIT tablet Take 1 tablet by mouth 2 (two) times daily.     [provider]  ?famotidine (PEPCID) 40 MG tablet Take 40 mg by mouth 2 (two) times daily.    [provider]  ?finasteride (PROSCAR) 5 MG tablet Take 5 mg by mouth daily. 03/31/20   [provider]  ?fluticasone (FLONASE) 50 MCG/ACT nasal spray Place 2 sprays into both nostrils daily as needed for allergies or rhinitis.    [provider]  ?furosemide (LASIX) 20 MG tablet Take 1 tablet (20 mg total) by mouth daily as needed for fluid or edema. ?Patient not taking: Reported  on 02/17/2021 08/08/20 09/07/20  Terrilee Croak, MD  ?Glycerin-Hypromellose-PEG 400 (DRY EYE RELIEF DROPS) 0.2-0.2-1 % SOLN Place 1 drop into both eyes daily as needed (Dry eye).    [provider]  ?insulin glargine (LANTUS) 100 UNIT/ML injection Inject 0.25 mLs (25 Units total) into the skin at bedtime. 08/08/20   Terrilee Croak, MD  ?lisinopril (ZESTRIL) 2.5 MG tablet Take 1 tablet (2.5 mg total) by mouth daily. ?Patient not taking: Reported on 02/17/2021 08/08/20 09/07/20  Terrilee Croak, MD  ?magnesium oxide (MAG-OX) 400 MG tablet Take 400 mg by mouth daily. ?Patient not taking: Reported on 02/19/2021    [provider]  ?metFORMIN (GLUCOPHAGE) 1000 MG tablet Take 1,000 mg by mouth 2 (two) times daily with a meal. 03/31/20   [provider]  ?metoprolol succinate (TOPROL-XL) 25 MG 24 hr tablet Take 25 mg by mouth daily.    [provider]  ?pantoprazole (PROTONIX) 40 MG tablet Take 40 mg by mouth daily. 07/11/19   [provider]  ?ranolazine (RANEXA) 1000 MG SR tablet Take 1,000 mg by mouth 2 (two) times daily. 08/13/19   [provider]  ?rosuvastatin (CRESTOR) 20 MG tablet Take 20 mg by mouth daily.     [provider]  ?tamsulosin (FLOMAX) 0.4 MG CAPS capsule Take 1 capsule (0.4 mg total) by mouth daily after breakfast. 08/08/20   Dahal, Marlowe Aschoff, MD  ?venlafaxine XR (EFFEXOR-XR) 75 MG 24 hr capsule  Take 225 mg by mouth daily with breakfast.    [provider]  ?   ? ?Allergies    ?Bee venom and Questran [cholestyramine]   ? ?Review of Systems   ?Review of Systems  ?Constitutional:  Negative for chills and fever.  ?Eyes:  Negative for visual disturbance.  ?Respiratory:  Negative for shortness of breath.   ?Cardiovascular:  Negative for chest pain and leg swelling.  ?Neurological:  Negative for light-headedness and headaches.  ?All other systems reviewed and are negative. ? ?Physical Exam ?Updated Vital Signs ?BP (!) 166/105   Pulse 77   Temp 98.2 ?F  (36.8 ?C) (Oral)   Resp 18   Ht '6\' 3"'$  (1.905 m)   Wt 92 kg   SpO2 97%   BMI 25.35 kg/m?  ?Physical Exam ?Vitals and nursing note reviewed.  ?Constitutional:   ?   General: He is not in acute distress. ?   Appearance: Normal appearance. He is not ill-appearing.  ?HENT:  ?   Head: Normocephalic and atraumatic.  ?   Nose: Nose normal.  ?Eyes:  ?   General: No scleral icterus. ?   Extraocular Movements: Extraocular movements intact.  ?   Conjunctiva/sclera: Conjunctivae normal.  ?Cardiovascular:  ?   Rate and Rhythm: Normal rate and regular rhythm.  ?   Pulses: Normal pulses.  ?Pulmonary:  ?   Effort: Pulmonary effort is normal. No respiratory distress.  ?   Breath sounds: Normal breath sounds. No wheezing or rales.  ?Abdominal:  ?   General: There is no distension.  ?   Tenderness: There is no abdominal tenderness.  ?Musculoskeletal:     ?   General: Normal range of motion.  ?   Cervical back: Normal range of motion.  ?Skin: ?   General: Skin is warm and dry.  ?   Comments: Left knee surgical wound without signs of infection.  Staples in place.  Without drainage.  ?Neurological:  ?   General: No focal deficit present.  ?   Mental Status: He is alert. Mental status is at baseline.  ? ? ?ED Results / Procedures / Treatments   ?Labs ?(all labs ordered are listed, but only abnormal results are displayed) ?Labs Reviewed  ?BASIC METABOLIC PANEL  ?CBC WITH DIFFERENTIAL/PLATELET  ? ? ?EKG ?None ? ?Radiology ?No results found. ? ?Procedures ?Procedures  ? ? ?Medications Ordered in ED ?Medications  ?amLODipine (NORVASC) tablet 5 mg (has no administration in time range)  ? ? ?ED Course/ Medical Decision Making/ A&P ?  ?                        ?Medical Decision Making ?Amount and/or Complexity of Data Reviewed ?Labs: ordered. ? ?Risk ?Prescription drug management. ? ? ?Medical Decision Making / ED Course ? ? ?This patient presents to the ED for concern of elevated blood pressure, this involves an extensive number of treatment  options, and is a complaint that carries with it a high risk of complications and morbidity.  The differential diagnosis includes hypertensive urgency, hypertensive emergency ? ?MDM: ?61 year old male presents today for evaluation of elevated blood pressure.  He was unable to get a hold of his PCP so he presented to the emergency room for evaluation.  Denies associated symptoms such as chest pain, shortness of breath, lightheadedness, visual change.  Does report left knee pain that is at baseline.  Appears to be healing well without signs of infection.  We will  provide patient a dose of Norvasc 5 mg and discharge him on this medication as well.  Will obtain CBC, BMP.  Patient's only current antihypertensive is 25 mg of metoprolol.  He states his lisinopril was recently discontinued.  He is unsure as to why.  He states he has taken amlodipine in the past as well but has been a couple years.  Denied intolerance to this. ?Patient CBC without leukocytosis.  Hemoglobin 11.5 however this is around his baseline.  BMP with glucose 139 otherwise without acute findings.  Patient is appropriate for discharge on amlodipine.  Discussed importance of BP diary and follow-up with PCP.  Patient voices understanding and is in agreement with plan.  Return precautions discussed.  He has an appointment with his PCP scheduled for 4/28. ? ? ?Lab Tests: ?-I ordered, reviewed, and interpreted labs.   ?The pertinent results include:   ?Labs Reviewed  ?BASIC METABOLIC PANEL  ?CBC WITH DIFFERENTIAL/PLATELET  ?  ? ? ?EKG ? EKG Interpretation ? ?Date/Time:    ?Ventricular Rate:    ?PR Interval:    ?QRS Duration:   ?QT Interval:    ?QTC Calculation:   ?R Axis:     ?Text Interpretation:   ?  ? ?  ? ? ?Medicines ordered and prescription drug management: ?Meds ordered this encounter  ?Medications  ? amLODipine (NORVASC) tablet 5 mg  ?  ?-I have reviewed the patients home medicines and have made adjustments as needed ? ?Reevaluation: ?After the  interventions noted above, I reevaluated the patient and found that they have :stayed the same ?Patient remained asymptomatic throughout his emergency room stay. ?Co morbidities that complicate the patient evalua

## 2021-05-18 NOTE — Discharge Instructions (Addendum)
Your blood pressure was elevated today.  You received a dose of amlodipine in the emergency room.  I have also prescribed and sent this to your pharmacy.  You did not have any concerning signs or symptoms along with your elevated blood pressure.  If you do develop chest pain, shortness of breath, episode of passing out, visual change please return to the emergency room for evaluation.  Otherwise follow-up with your primary care provider on your scheduled appointment on 4/28.  Keep a blood pressure diary in the meantime. ?

## 2021-05-18 NOTE — ED Triage Notes (Signed)
Patient states his home care nurse post knee replacement was taking hs blood pressure and found his Bp to be 220/120ish and nurse told patient to come straight to the ED. Patient denies any other symptoms associated with hypertension.  ?

## 2021-08-31 ENCOUNTER — Telehealth: Payer: Self-pay | Admitting: Gastroenterology

## 2021-08-31 NOTE — Telephone Encounter (Signed)
Patient is calling to schedule a colonoscopy for January. Requests call back.

## 2021-09-01 ENCOUNTER — Other Ambulatory Visit: Payer: Self-pay

## 2021-09-01 ENCOUNTER — Telehealth: Payer: Self-pay

## 2021-09-01 DIAGNOSIS — Z86010 Personal history of colonic polyps: Secondary | ICD-10-CM

## 2021-09-01 NOTE — Telephone Encounter (Signed)
Gastroenterology Pre-Procedure Review  Request Date: 02/15/22 Requesting Physician: Dr. Vicente Males  PATIENT REVIEW QUESTIONS: The patient responded to the following health history questions as indicated:    1. Are you having any GI issues? no 2. Do you have a personal history of Polyps? yes (last colnoscopy was with Dr. Bonna Gains 01/2017 polyps were present) 3. Do you have a family history of Colon Cancer or Polyps? no 4. Diabetes Mellitus? yes (insulin) pt no longer taking metformin 5. Joint replacements in the past 12 months?yes (total knee replacement April 9th 2023 [facility]) 6. Major health problems in the past 3 months?no 7. Any artificial heart valves, MVP, or defibrillator?no Cardiac History:CAD Cardiologist Dr. Neoma Laming Cardiac Clearance sent to office    MEDICATIONS & ALLERGIES:    Patient reports the following regarding taking any anticoagulation/antiplatelet therapy:   Plavix, Coumadin, Eliquis, Xarelto, Lovenox, Pradaxa, Brilinta, or Effient? no Aspirin? yes (81 mg daily)  Patient confirms/reports the following medications:  Current Outpatient Medications  Medication Sig Dispense Refill   albuterol (VENTOLIN HFA) 108 (90 Base) MCG/ACT inhaler Inhale 2 puffs into the lungs every 6 (six) hours as needed for wheezing or shortness of breath. 8 g 1   amLODipine (NORVASC) 5 MG tablet Take 1 tablet (5 mg total) by mouth daily. 30 tablet 0   aspirin EC 81 MG tablet Take 81 mg by mouth daily.     Calcium Carbonate-Vitamin D 600-400 MG-UNIT tablet Take 1 tablet by mouth 2 (two) times daily.      famotidine (PEPCID) 40 MG tablet Take 40 mg by mouth 2 (two) times daily.     finasteride (PROSCAR) 5 MG tablet Take 5 mg by mouth daily.     fluticasone (FLONASE) 50 MCG/ACT nasal spray Place 2 sprays into both nostrils daily as needed for allergies or rhinitis.     furosemide (LASIX) 20 MG tablet Take 1 tablet (20 mg total) by mouth daily as needed for fluid or edema. (Patient not taking:  Reported on 02/17/2021) 30 tablet 0   Glycerin-Hypromellose-PEG 400 (DRY EYE RELIEF DROPS) 0.2-0.2-1 % SOLN Place 1 drop into both eyes daily as needed (Dry eye).     insulin glargine (LANTUS) 100 UNIT/ML injection Inject 0.25 mLs (25 Units total) into the skin at bedtime. 10 mL 11   lisinopril (ZESTRIL) 2.5 MG tablet Take 1 tablet (2.5 mg total) by mouth daily. (Patient not taking: Reported on 02/17/2021) 30 tablet 0   magnesium oxide (MAG-OX) 400 MG tablet Take 400 mg by mouth daily. (Patient not taking: Reported on 02/19/2021)     metFORMIN (GLUCOPHAGE) 1000 MG tablet Take 1,000 mg by mouth 2 (two) times daily with a meal.     metoprolol succinate (TOPROL-XL) 25 MG 24 hr tablet Take 25 mg by mouth daily.     pantoprazole (PROTONIX) 40 MG tablet Take 40 mg by mouth daily.     ranolazine (RANEXA) 1000 MG SR tablet Take 1,000 mg by mouth 2 (two) times daily.     rosuvastatin (CRESTOR) 20 MG tablet Take 20 mg by mouth daily.      tamsulosin (FLOMAX) 0.4 MG CAPS capsule Take 1 capsule (0.4 mg total) by mouth daily after breakfast. 30 capsule    venlafaxine XR (EFFEXOR-XR) 75 MG 24 hr capsule Take 225 mg by mouth daily with breakfast.     Current Facility-Administered Medications  Medication Dose Route Frequency Provider Last Rate Last Admin   betamethasone acetate-betamethasone sodium phosphate (CELESTONE) injection 3 mg  3 mg Intramuscular  Once Edrick Kins, DPM       Facility-Administered Medications Ordered in Other Visits  Medication Dose Route Frequency Provider Last Rate Last Admin   sodium chloride flush (NS) 0.9 % injection 3 mL  3 mL Intravenous Q12H Neoma Laming A, MD       sodium chloride flush (NS) 0.9 % injection 3 mL  3 mL Intravenous Q12H Scoggins, Amber, NP        Patient confirms/reports the following allergies:  Allergies  Allergen Reactions   Bee Venom Anaphylaxis and Swelling   Questran [Cholestyramine] Other (See Comments)    Reaction unknown    No orders of the  defined types were placed in this encounter.   AUTHORIZATION INFORMATION Primary Insurance: 1D#: Group #:  Secondary Insurance: 1D#: Group #:  SCHEDULE INFORMATION: Date: 02/15/22 Time: Location: [facility]

## 2021-12-15 ENCOUNTER — Telehealth: Payer: Self-pay

## 2021-12-15 NOTE — Telephone Encounter (Signed)
LVM for pt to return my call in regards to cardiac clearance needed for his colonoscopy. His colonoscopy is scheduled for 02/15/22.   Thanks, [place], [place]

## 2021-12-15 NOTE — Telephone Encounter (Signed)
Cardiac clearance was sent to Dr. Neoma Laming however I was informed that patient has never been seen by them.  Pt has history of CAD, CABG x3 2014,  and Unstable angina.  Referral received from the [facility].  He does not have a cardiologist to request clearance for his 02/15/22 Colonoscopy.  Please advise as to if clearance is needed for him.  Thanks,  [employer]

## 2021-12-21 ENCOUNTER — Telehealth: Payer: Self-pay

## 2021-12-21 NOTE — Telephone Encounter (Signed)
Pt returned phone call.  I called him back and was unable to reach him.  Left another voice message for him to call me back.  Thanks,  [place], [place]

## 2021-12-22 NOTE — Telephone Encounter (Signed)
Patient has been contacted and advised that we will need a cardiac clearance from his cardiologist.  He stated that Dr. Humphrey Rolls is his cardiologist-I've asked him to contact Dr. Laurelyn Sickle office directly because Dr. Laurelyn Sickle office informed me that he is not their patient.  Informed him that if we are unable to get the clearance we will need to cancel his 02/15/22 Colonoscopy.  Thanks,  [place], [place]

## 2021-12-28 NOTE — Telephone Encounter (Signed)
Pt lvm on Friday.  Call returned this morning. He stated that he has an appt scheduled with cardiology.  I've asked him to update me after his appt.  Thanks,  [place], [place]

## 2022-02-08 ENCOUNTER — Telehealth: Payer: Self-pay

## 2022-02-08 ENCOUNTER — Other Ambulatory Visit: Payer: Self-pay

## 2022-02-08 DIAGNOSIS — Z86010 Personal history of colonic polyps: Secondary | ICD-10-CM

## 2022-02-08 MED ORDER — NA SULFATE-K SULFATE-MG SULF 17.5-3.13-1.6 GM/177ML PO SOLN
1.0000 | Freq: Once | ORAL | 0 refills | Status: AC
Start: 2022-02-08 — End: 2022-02-08

## 2022-02-08 NOTE — Telephone Encounter (Signed)
Contacted patient to see if he had his visit with his cardiologist as discussed a few months back.  He stated that he did go to see his cardiologist Dr.Shaukat Humphrey Rolls, however Dr. Humphrey Rolls advised that he needs to have a stress test.  He said his stress test has not been ordered yet.  He plans on contacting Dr. Trish Mage office to see when they plan on doing the stress test.  Patient has been advised that if his cardiac clearance has not been received this week-we will need to cancel his colonoscopy until we can receive clearance.  Thanks,  [place], [place]

## 2022-02-08 NOTE — Telephone Encounter (Signed)
Patient called back to inform me that Dr. Humphrey Rolls will sign off on Cardiac request.  Will resend Cardiac request for them to sign off on.  Thanks,  [place], [place]

## 2022-02-11 NOTE — Telephone Encounter (Signed)
Cardiac clearance has been granted. Signed off by [employer] FNP-C on 12/13/22.  Thanks,  [place], [place]

## 2022-02-15 ENCOUNTER — Ambulatory Visit: Payer: Medicaid Other | Admitting: Anesthesiology

## 2022-02-15 ENCOUNTER — Encounter
Admission: RE | Disposition: A | Discharge status: Home / Self Care | Payer: Self-pay | Source: Ambulatory Visit | Attending: Gastroenterology

## 2022-02-15 ENCOUNTER — Other Ambulatory Visit: Payer: Self-pay

## 2022-02-15 ENCOUNTER — Encounter: Payer: Self-pay | Admitting: Gastroenterology

## 2022-02-15 ENCOUNTER — Ambulatory Visit
Admission: RE | Admit: 2022-02-15 | Discharge: 2022-02-15 | Disposition: A | Discharge status: Home / Self Care | Payer: Medicaid Other | Source: Ambulatory Visit | Attending: Gastroenterology | Admitting: Gastroenterology

## 2022-02-15 DIAGNOSIS — Z860100 Personal history of colon polyps, unspecified: Secondary | ICD-10-CM

## 2022-02-15 DIAGNOSIS — Z8673 Personal history of transient ischemic attack (TIA), and cerebral infarction without residual deficits: Secondary | ICD-10-CM | POA: Diagnosis not present

## 2022-02-15 DIAGNOSIS — Z1211 Encounter for screening for malignant neoplasm of colon: Secondary | ICD-10-CM | POA: Diagnosis not present

## 2022-02-15 DIAGNOSIS — Z951 Presence of aortocoronary bypass graft: Secondary | ICD-10-CM | POA: Diagnosis not present

## 2022-02-15 DIAGNOSIS — Z87891 Personal history of nicotine dependence: Secondary | ICD-10-CM | POA: Insufficient documentation

## 2022-02-15 DIAGNOSIS — I252 Old myocardial infarction: Secondary | ICD-10-CM | POA: Diagnosis not present

## 2022-02-15 DIAGNOSIS — E109 Type 1 diabetes mellitus without complications: Secondary | ICD-10-CM | POA: Diagnosis not present

## 2022-02-15 DIAGNOSIS — I2510 Atherosclerotic heart disease of native coronary artery without angina pectoris: Secondary | ICD-10-CM | POA: Diagnosis not present

## 2022-02-15 DIAGNOSIS — I10 Essential (primary) hypertension: Secondary | ICD-10-CM | POA: Diagnosis not present

## 2022-02-15 DIAGNOSIS — Z794 Long term (current) use of insulin: Secondary | ICD-10-CM | POA: Insufficient documentation

## 2022-02-15 DIAGNOSIS — J45909 Unspecified asthma, uncomplicated: Secondary | ICD-10-CM | POA: Insufficient documentation

## 2022-02-15 HISTORY — PX: COLONOSCOPY WITH PROPOFOL: SHX5780

## 2022-02-15 LAB — GLUCOSE, CAPILLARY: Glucose-Capillary: 96 mg/dL (ref 70–99)

## 2022-02-15 SURGERY — COLONOSCOPY WITH PROPOFOL
Anesthesia: General

## 2022-02-15 MED ORDER — SODIUM CHLORIDE 0.9 % IV SOLN: INTRAVENOUS | Status: DC

## 2022-02-15 MED ORDER — PROPOFOL 10 MG/ML IV BOLUS
INTRAVENOUS | Status: AC
  Filled 2022-02-15: qty 20

## 2022-02-15 MED ORDER — PROPOFOL 500 MG/50ML IV EMUL
INTRAVENOUS | Status: DC | PRN
  Administered 2022-02-15: 165 ug/kg/min via INTRAVENOUS

## 2022-02-15 NOTE — Anesthesia Preprocedure Evaluation (Signed)
Anesthesia Evaluation  Patient identified by MRN, date of birth, ID band Patient awake    Reviewed: Allergy & Precautions, NPO status , Patient's Chart, lab work & pertinent test results  Airway Mallampati: II  TM Distance: >3 FB     Dental  (+) Upper Dentures, Partial Lower   Pulmonary asthma , Patient abstained from smoking., former smoker    + decreased breath sounds      Cardiovascular hypertension, Pt. on medications + CAD, + Past MI and + CABG   Rhythm:Regular     Neuro/Psych  Headaches   Depression    CVA    GI/Hepatic negative GI ROS,,,(+) Hepatitis -  Endo/Other  diabetes, Well Controlled, Type 1, Insulin Dependent    Renal/GU Renal InsufficiencyRenal disease     Musculoskeletal  (+) Arthritis ,    Abdominal Normal abdominal exam  (+)   Peds negative pediatric ROS (+)  Hematology   Anesthesia Other Findings   Reproductive/Obstetrics                             Anesthesia Physical Anesthesia Plan  ASA: 3  Anesthesia Plan: General   Post-op Pain Management:    Induction: Intravenous  PONV Risk Score and Plan:   Airway Management Planned: Natural Airway  Additional Equipment:   Intra-op Plan:   Post-operative Plan:   Informed Consent: I have reviewed the patients History and Physical, chart, labs and discussed the procedure including the risks, benefits and alternatives for the proposed anesthesia with the patient or authorized representative who has indicated his/her understanding and acceptance.       Plan Discussed with: CRNA and Surgeon  Anesthesia Plan Comments:        Anesthesia Quick Evaluation

## 2022-02-15 NOTE — Op Note (Signed)
[facility] Gastroenterology Patient Name: [name] Procedure Date: 02/15/2022 11:55 AM MRN: [id number] Account #: [id number] Date of Birth: [birth year] Admit Type: Outpatient Age: 62 Room: [facility] ENDO ROOM 1 Gender: Male Note Status: Finalized Instrument Name: Park Meo [id number] Procedure:             Colonoscopy Indications:           Screening for colorectal malignant neoplasm Providers:             [name] MD, MD Referring MD:          No Local Md, MD (Referring MD) Medicines:             Monitored Anesthesia Care Complications:         No immediate complications. Procedure:             Pre-Anesthesia Assessment:                        - ASA Grade Assessment: II - A patient with mild                         systemic disease.                        After obtaining informed consent, the colonoscope was                         passed under direct vision. Throughout the procedure,                         the patient's blood pressure, pulse, and oxygen                         saturations were monitored continuously. The                         Colonoscope was introduced through the anus and                         advanced to the the cecum, identified by the                         appendiceal orifice. The colonoscopy was performed                         with ease. The patient tolerated the procedure well.                         The quality of the bowel preparation was inadequate.                         The ileocecal valve, appendiceal orifice, and rectum                         were photographed. Findings:      The perianal and digital rectal examinations were normal.      A large amount of semi-liquid stool was found in the entire colon. Impression:            - Preparation of the colon was inadequate.                        -  Stool in the entire examined colon.                        - No specimens collected. Recommendation:        - Discharge  patient to home (with escort).                        - Resume previous diet.                        - Continue present medications.                        - Repeat colonoscopy in 2 months because the bowel                         preparation was suboptimal. Procedure Code(s):     --- Professional ---                        [phone], Colonoscopy, flexible; diagnostic, including                         collection of specimen(s) by brushing or washing, when                         performed (separate procedure) Diagnosis Code(s):     --- Professional ---                        Z12.11, Encounter for screening for malignant neoplasm                         of colon CPT copyright 2022 American [employer] Association. All rights reserved. The codes documented in this report are preliminary and upon coder review may  be revised to meet current compliance requirements. [name], MD [name] MD, MD 02/15/2022 12:17:34 PM This report has been signed electronically. Number of Addenda: 0 Note Initiated On: 02/15/2022 11:55 AM Scope Withdrawal Time: 0 hours 6 minutes 9 seconds  Total Procedure Duration: 0 hours 10 minutes 23 seconds  Estimated Blood Loss:  Estimated blood loss: none.      [facility]

## 2022-02-15 NOTE — Transfer of Care (Signed)
Immediate Anesthesia Transfer of Care Note  Patient: [name]  Procedure(s) Performed: COLONOSCOPY WITH PROPOFOL  Patient Location: PACU  Anesthesia Type:General  Level of Consciousness: awake and alert   Airway & Oxygen Therapy: Patient Spontanous Breathing and Patient connected to nasal cannula oxygen  Post-op Assessment: Report given to RN and Post -op Vital signs reviewed and stable  Post vital signs: Reviewed and stable  Last Vitals:  Vitals Value Taken Time  BP 135/77 02/15/22 1220  Temp    Pulse 57 02/15/22 1221  Resp 21 02/15/22 1221  SpO2 100 % 02/15/22 1221  Vitals shown include unvalidated device data.  Last Pain:  Vitals:   02/15/22 1210  TempSrc: Temporal  PainSc:          Complications: No notable events documented.

## 2022-02-15 NOTE — H&P (Signed)
Andrew Bellows, MD [address] [place], [place], [place], [address] [address], [place], [place], [place], [address] Phone: [phone]  Fax: [phone]  Primary Care Physician:  [facility]   Pre-Procedure History & Physical: HPI:  [name] is a 62 y.o. male is here for an colonoscopy.   Past Medical History:  Diagnosis Date   Asthma    Cancer [facility])    prostate   Chronic back pain    Chronic neck pain    Cirrhosis of liver ([place])    Complicated migraine 02/16/4495   Coronary artery disease    Depression    Diabetes mellitus without complication ([place])    Gallstones    Headache(784.0)    Hepatitis C    Hypercholesteremia    Hypertension    Left leg paresthesias    Myocardial infarction ([place])    Stroke ([place])    TIA   Vertigo     Past Surgical History:  Procedure Laterality Date   BYPASS GRAFT     triple bypass surgery   CARDIAC SURGERY     COLONOSCOPY WITH PROPOFOL N/A 02/15/2017   Procedure: COLONOSCOPY WITH PROPOFOL;  Surgeon: Virgel Manifold, MD;  Location: [place];  Service: Endoscopy;  Laterality: N/A;   CORONARY ARTERY BYPASS GRAFT     ESOPHAGOGASTRODUODENOSCOPY (EGD) WITH PROPOFOL N/A 02/15/2017   Procedure: ESOPHAGOGASTRODUODENOSCOPY (EGD) WITH PROPOFOL;  Surgeon: Virgel Manifold, MD;  Location: [place];  Service: Endoscopy;  Laterality: N/A;   HAND SURGERY  1984   KNEE ARTHROPLASTY Left 05/04/2021   LEFT HEART CATH AND CORONARY ANGIOGRAPHY Left 04/29/2017   Procedure: LEFT HEART CATH AND CORONARY ANGIOGRAPHY;  Surgeon: Dionisio David, MD;  Location: [place] [facility];  Service: Cardiovascular;  Laterality: Left;   LEFT HEART CATH AND CORONARY ANGIOGRAPHY Left 01/08/2019   Procedure: LEFT HEART CATH AND CORONARY ANGIOGRAPHY;  Surgeon: Dionisio David, MD;  Location: [place] [facility];  Service: Cardiovascular;  Laterality: Left;   LEFT HEART CATH AND CORONARY ANGIOGRAPHY N/A 02/19/2021    Procedure: LEFT HEART CATH AND CORONARY ANGIOGRAPHY with intervention;  Surgeon: Dionisio David, MD;  Location: [place] [facility];  Service: Cardiovascular;  Laterality: N/A;   ORTHOPEDIC SURGERY     "on my legs"    Prior to Admission medications   Medication Sig Start Date End Date Taking? Authorizing Provider  amLODipine (NORVASC) 5 MG tablet Take 1 tablet (5 mg total) by mouth daily. 05/18/21  Yes Evlyn Courier, PA-C  aspirin EC 81 MG tablet Take 81 mg by mouth daily.   Yes [provider]  Calcium Carbonate-Vitamin D 600-400 MG-UNIT tablet Take 1 tablet by mouth 2 (two) times daily.    Yes [provider]  famotidine (PEPCID) 40 MG tablet Take 40 mg by mouth 2 (two) times daily.   Yes [provider]  finasteride (PROSCAR) 5 MG tablet Take 5 mg by mouth daily. 03/31/20  Yes [provider]  fluticasone (FLONASE) 50 MCG/ACT nasal spray Place 2 sprays into both nostrils daily as needed for allergies or rhinitis.   Yes [provider]  metoprolol succinate (TOPROL-XL) 25 MG 24 hr tablet Take 25 mg by mouth daily.   Yes [provider]  rosuvastatin (CRESTOR) 20 MG tablet Take 20 mg by mouth daily.    Yes [provider]  tamsulosin (FLOMAX) 0.4 MG CAPS capsule Take 1 capsule (0.4 mg total) by mouth daily after breakfast. 08/08/20  Yes Dahal, Marlowe Aschoff, MD  venlafaxine XR (EFFEXOR-XR) 75 MG 24 hr capsule Take 225 mg by mouth daily with breakfast.   Yes [provider]  albuterol (VENTOLIN HFA) 108 (90 Base) MCG/ACT inhaler Inhale 2 puffs into the lungs every 6 (six) hours as needed for wheezing or shortness of breath. 04/05/19   Manuella Ghazi, Pratik D, DO  furosemide (LASIX) 20 MG tablet Take 1 tablet (20 mg total) by mouth daily as needed for fluid or edema. Patient not taking: Reported on 02/17/2021 08/08/20 09/07/20  Terrilee Croak, MD  Glycerin-Hypromellose-PEG 400 (DRY EYE RELIEF DROPS) 0.2-0.2-1 % SOLN Place 1 drop into both eyes daily as  needed (Dry eye).    [provider]  insulin glargine (LANTUS) 100 UNIT/ML injection Inject 0.25 mLs (25 Units total) into the skin at bedtime. 08/08/20   Terrilee Croak, MD  lisinopril (ZESTRIL) 2.5 MG tablet Take 1 tablet (2.5 mg total) by mouth daily. Patient not taking: Reported on 02/17/2021 08/08/20 09/07/20  Terrilee Croak, MD  magnesium oxide (MAG-OX) 400 MG tablet Take 400 mg by mouth daily. Patient not taking: Reported on 02/19/2021    [provider]  metFORMIN (GLUCOPHAGE) 1000 MG tablet Take 1,000 mg by mouth 2 (two) times daily with a meal. Patient not taking: Reported on 09/01/2021 03/31/20   [provider]  pantoprazole (PROTONIX) 40 MG tablet Take 40 mg by mouth daily. Patient not taking: Reported on 02/15/2022 07/11/19   [provider]  ranolazine (RANEXA) 1000 MG SR tablet Take 1,000 mg by mouth 2 (two) times daily. Patient not taking: Reported on 02/15/2022 08/13/19   [provider]    Allergies as of 09/01/2021 - Review Complete 05/18/2021  Allergen Reaction Noted   Bee venom Anaphylaxis and Swelling 06/13/2011   Questran [cholestyramine] Other (See Comments) 10/01/2016    Family History  Problem Relation Age of Onset   Diabetes Brother    Diabetes Sister    Diabetes Father    Hypertension Father    Diabetes Mother    Hypertension Mother    Hypertension Sister    Hypertension Brother     Social History   Socioeconomic History   Marital status: Legally Separated    Spouse name: Not on file   Number of children: Not on file   Years of education: Not on file   Highest education level: Not on file  Occupational History   Not on file  Tobacco Use   Smoking status: Former    Packs/day: 1.00    Years: 35.00    Total pack years: 35.00    Types: Cigarettes    Quit date: 01/25/2009    Years since quitting: 13.0   Smokeless tobacco: Never  Vaping Use   Vaping Use: Never used  Substance and Sexual Activity   Alcohol use:  No    Comment: former alcoholic   Drug use: No    Comment: former cocaine abuse   Sexual activity: Not Currently  Other Topics Concern   Not on file  Social History Narrative   Not on file   Social Determinants of Health   Financial Resource Strain: Not on file  Food Insecurity: Not on file  Transportation Needs: Not on file  Physical Activity: Not on file  Stress: Not on file  Social Connections: Not on file  Intimate Partner Violence: Not on file    Review of Systems: See HPI, otherwise negative ROS  Physical Exam: BP (!) 188/93   Pulse (!) 55  Temp (!) 97.2 F (36.2 C) (Temporal)   Resp 16   Ht '6\' 3"'$  (1.905 m)   Wt 90.9 kg   SpO2 99%   BMI 25.05 kg/m  General:   Alert,  pleasant and cooperative in NAD Head:  Normocephalic and atraumatic. Neck:  Supple; no masses or thyromegaly. Lungs:  Clear throughout to auscultation, normal respiratory effort.    Heart:  +S1, +S2, Regular rate and rhythm, No edema. Abdomen:  Soft, nontender and nondistended. Normal bowel sounds, without guarding, and without rebound.   Neurologic:  Alert and  oriented x4;  grossly normal neurologically.  Impression/Plan: [name] is here for an colonoscopy to be performed for surveillance due to prior history of colon polyps   Risks, benefits, limitations, and alternatives regarding  colonoscopy have been reviewed with the patient.  Questions have been answered.  All parties agreeable.   Andrew Bellows, MD  02/15/2022, 11:52 AM

## 2022-02-15 NOTE — Anesthesia Postprocedure Evaluation (Signed)
Anesthesia Post Note  Patient: [name]  Procedure(s) Performed: COLONOSCOPY WITH PROPOFOL  Patient location during evaluation: PACU Anesthesia Type: General Level of consciousness: awake Pain management: satisfactory to patient Vital Signs Assessment: post-procedure vital signs reviewed and stable Respiratory status: spontaneous breathing and nonlabored ventilation Cardiovascular status: stable Anesthetic complications: no  No notable events documented.   Last Vitals:  Vitals:   02/15/22 1237 02/15/22 1240  BP: 124/83 (!) 162/85  Pulse: (!) 55   Resp: 12 (!) 39  Temp:    SpO2:      Last Pain:  Vitals:   02/15/22 1210  TempSrc: Temporal  PainSc:                  VAN STAVEREN,[name]

## 2022-02-16 ENCOUNTER — Encounter: Payer: Self-pay | Admitting: Gastroenterology

## 2022-03-12 ENCOUNTER — Other Ambulatory Visit: Payer: Self-pay | Admitting: Cardiovascular Disease

## 2022-03-17 ENCOUNTER — Telehealth: Payer: Medicaid Other | Admitting: Gastroenterology

## 2022-03-17 DIAGNOSIS — Z86010 Personal history of colonic polyps: Secondary | ICD-10-CM

## 2022-03-17 NOTE — Telephone Encounter (Signed)
Patient calling stating that he needs to schedule his colonoscopy because the one that he had he was not cleaned out all the way. Requesting call back.

## 2022-03-18 ENCOUNTER — Other Ambulatory Visit: Payer: Self-pay

## 2022-03-18 DIAGNOSIS — Z86010 Personal history of colonic polyps: Secondary | ICD-10-CM

## 2022-03-18 MED ORDER — NA SULFATE-K SULFATE-MG SULF 17.5-3.13-1.6 GM/177ML PO SOLN: 2.0000 | ORAL | 0 refills | Status: DC

## 2022-03-18 NOTE — Telephone Encounter (Signed)
Patient has been called back to reschedule from 03/31/22 to 04/16/22 based on Dr. Georgeann Oppenheim procedure note recommendations to repeat colonoscopy in 2 months due to suboptimal prep. 03/30/20 date would have been too soon.    Thanks, [place], [place]

## 2022-03-18 NOTE — Addendum Note (Signed)
Addended by: Vanetta Mulders on: 03/18/2022 08:53 AM   Modules accepted: Orders

## 2022-03-18 NOTE — Telephone Encounter (Signed)
Returned call to patient to schedule colonoscopy with a 2 day prep.  Colonoscopy has been scheduled with Dr. Vicente Males on 03/31/22.  Patient has been instructed the following to prepare for colonoscopy w/ 2 day prep using Suprep -Low Fiber Diet on 03/03 until Noon on 03/04  -Clear Liquid Diet on 03/04 starting at [facility] -Prep #1 start at 5pm on 03/29/22 mix with water. Continue Clear Liquid Diet. -Prep #2 start at 5pm on 03/05 Mix with water. Continue Clear Liquid Diet. -Prep #3 5 hours before procedure. Mix with water. -Do Not Eat or Drink 4 hours before procedure.  Thanks, [place], [place]

## 2022-04-15 ENCOUNTER — Encounter: Payer: Self-pay | Admitting: Gastroenterology

## 2022-04-16 ENCOUNTER — Ambulatory Visit: Payer: Medicaid Other | Admitting: Registered Nurse

## 2022-04-16 ENCOUNTER — Encounter
Admission: RE | Disposition: A | Discharge status: Home / Self Care | Payer: Self-pay | Source: Ambulatory Visit | Attending: Gastroenterology

## 2022-04-16 ENCOUNTER — Ambulatory Visit
Admission: RE | Admit: 2022-04-16 | Discharge: 2022-04-16 | Disposition: A | Discharge status: Home / Self Care | Payer: Medicaid Other | Source: Ambulatory Visit | Attending: Gastroenterology | Admitting: Gastroenterology

## 2022-04-16 DIAGNOSIS — D126 Benign neoplasm of colon, unspecified: Secondary | ICD-10-CM | POA: Diagnosis not present

## 2022-04-16 DIAGNOSIS — Z951 Presence of aortocoronary bypass graft: Secondary | ICD-10-CM | POA: Diagnosis not present

## 2022-04-16 DIAGNOSIS — Z87891 Personal history of nicotine dependence: Secondary | ICD-10-CM | POA: Insufficient documentation

## 2022-04-16 DIAGNOSIS — E7800 Pure hypercholesterolemia, unspecified: Secondary | ICD-10-CM | POA: Insufficient documentation

## 2022-04-16 DIAGNOSIS — D122 Benign neoplasm of ascending colon: Secondary | ICD-10-CM | POA: Insufficient documentation

## 2022-04-16 DIAGNOSIS — Z8673 Personal history of transient ischemic attack (TIA), and cerebral infarction without residual deficits: Secondary | ICD-10-CM | POA: Insufficient documentation

## 2022-04-16 DIAGNOSIS — J45909 Unspecified asthma, uncomplicated: Secondary | ICD-10-CM | POA: Diagnosis not present

## 2022-04-16 DIAGNOSIS — Z1211 Encounter for screening for malignant neoplasm of colon: Secondary | ICD-10-CM | POA: Insufficient documentation

## 2022-04-16 DIAGNOSIS — I2510 Atherosclerotic heart disease of native coronary artery without angina pectoris: Secondary | ICD-10-CM | POA: Insufficient documentation

## 2022-04-16 DIAGNOSIS — F32A Depression, unspecified: Secondary | ICD-10-CM | POA: Insufficient documentation

## 2022-04-16 DIAGNOSIS — E119 Type 2 diabetes mellitus without complications: Secondary | ICD-10-CM | POA: Diagnosis not present

## 2022-04-16 DIAGNOSIS — Z09 Encounter for follow-up examination after completed treatment for conditions other than malignant neoplasm: Secondary | ICD-10-CM | POA: Insufficient documentation

## 2022-04-16 DIAGNOSIS — I252 Old myocardial infarction: Secondary | ICD-10-CM | POA: Diagnosis not present

## 2022-04-16 DIAGNOSIS — I10 Essential (primary) hypertension: Secondary | ICD-10-CM | POA: Insufficient documentation

## 2022-04-16 DIAGNOSIS — Z86010 Personal history of colonic polyps: Secondary | ICD-10-CM

## 2022-04-16 HISTORY — PX: COLONOSCOPY WITH PROPOFOL: SHX5780

## 2022-04-16 LAB — GLUCOSE, CAPILLARY: Glucose-Capillary: 98 mg/dL (ref 70–99)

## 2022-04-16 SURGERY — COLONOSCOPY WITH PROPOFOL
Anesthesia: General

## 2022-04-16 MED ORDER — LIDOCAINE HCL (CARDIAC) PF 100 MG/5ML IV SOSY
PREFILLED_SYRINGE | INTRAVENOUS | Status: DC | PRN
  Administered 2022-04-16: 40 mg via INTRAVENOUS

## 2022-04-16 MED ORDER — PROPOFOL 10 MG/ML IV BOLUS
INTRAVENOUS | Status: DC | PRN
  Administered 2022-04-16: 90 mg via INTRAVENOUS

## 2022-04-16 MED ORDER — PROPOFOL 500 MG/50ML IV EMUL
INTRAVENOUS | Status: DC | PRN
  Administered 2022-04-16: 150 ug/kg/min via INTRAVENOUS

## 2022-04-16 MED ORDER — SODIUM CHLORIDE 0.9 % IV SOLN
INTRAVENOUS | Status: DC
  Administered 2022-04-16: 20 mL/h via INTRAVENOUS

## 2022-04-16 NOTE — H&P (Signed)
[name], MD [address], [place], [place], [place], [address] [address], [place], [place], [place], [address] Phone: [phone]  Fax: [phone]  Primary Care Physician:  [place]   Pre-Procedure History & Physical: HPI:  [name] is a 62 y.o. male is here for an colonoscopy.   Past Medical History:  Diagnosis Date   Asthma    Cancer [facility])    prostate   Chronic back pain    Chronic neck pain    Cirrhosis of liver ([place])    Complicated migraine [id number]   Coronary artery disease    Depression    Diabetes mellitus without complication ([place])    Gallstones    Headache(784.0)    Hepatitis C    Hypercholesteremia    Hypertension    Left leg paresthesias    Myocardial infarction ([place])    Stroke ([place])    TIA   Vertigo     Past Surgical History:  Procedure Laterality Date   BYPASS GRAFT     triple bypass surgery   CARDIAC SURGERY     COLONOSCOPY WITH PROPOFOL N/A 02/15/2017   Procedure: COLONOSCOPY WITH PROPOFOL;  Surgeon: Virgel Manifold, MD;  Location: [place];  Service: Endoscopy;  Laterality: N/A;   COLONOSCOPY WITH PROPOFOL N/A 02/15/2022   Procedure: COLONOSCOPY WITH PROPOFOL;  Surgeon: [name], MD;  Location: [facility];  Service: Gastroenterology;  Laterality: N/A;   CORONARY ANGIOPLASTY     CORONARY ARTERY BYPASS GRAFT     ESOPHAGOGASTRODUODENOSCOPY (EGD) WITH PROPOFOL N/A 02/15/2017   Procedure: ESOPHAGOGASTRODUODENOSCOPY (EGD) WITH PROPOFOL;  Surgeon: Virgel Manifold, MD;  Location: [place];  Service: Endoscopy;  Laterality: N/A;   HAND SURGERY  1984   KNEE ARTHROPLASTY Left 05/04/2021   LEFT HEART CATH AND CORONARY ANGIOGRAPHY Left 04/29/2017   Procedure: LEFT HEART CATH AND CORONARY ANGIOGRAPHY;  Surgeon: Dionisio David, MD;  Location: [place] [facility];  Service: Cardiovascular;  Laterality: Left;   LEFT HEART CATH AND CORONARY ANGIOGRAPHY Left 01/08/2019    Procedure: LEFT HEART CATH AND CORONARY ANGIOGRAPHY;  Surgeon: Dionisio David, MD;  Location: [place] [facility];  Service: Cardiovascular;  Laterality: Left;   LEFT HEART CATH AND CORONARY ANGIOGRAPHY N/A 02/19/2021   Procedure: LEFT HEART CATH AND CORONARY ANGIOGRAPHY with intervention;  Surgeon: Dionisio David, MD;  Location: [place] [facility];  Service: Cardiovascular;  Laterality: N/A;   ORTHOPEDIC SURGERY     "on my legs"    Prior to Admission medications   Medication Sig Start Date End Date Taking? Authorizing Provider  albuterol (VENTOLIN HFA) 108 (90 Base) MCG/ACT inhaler Inhale 2 puffs into the lungs every 6 (six) hours as needed for wheezing or shortness of breath. 04/05/19  Yes Shah, Pratik D, DO  amLODipine (NORVASC) 5 MG tablet Take 1 tablet (5 mg total) by mouth daily. 05/18/21  Yes Evlyn Courier, PA-C  aspirin EC 81 MG tablet Take 81 mg by mouth daily.   Yes [provider]  Calcium Carbonate-Vitamin D 600-400 MG-UNIT tablet Take 1 tablet by mouth 2 (two) times daily.    Yes [provider]  famotidine (PEPCID) 40 MG tablet Take 40 mg by mouth 2 (two) times daily.   Yes [provider]  finasteride (PROSCAR) 5 MG tablet Take 5 mg by mouth daily. 03/31/20  Yes [provider]  fluticasone (FLONASE) 50 MCG/ACT nasal spray Place 2 sprays into both nostrils daily  as needed for allergies or rhinitis.   Yes [provider]  Glycerin-Hypromellose-PEG 400 (DRY EYE RELIEF DROPS) 0.2-0.2-1 % SOLN Place 1 drop into both eyes daily as needed (Dry eye).   Yes [provider]  insulin glargine (LANTUS) 100 UNIT/ML injection Inject 0.25 mLs (25 Units total) into the skin at bedtime. 08/08/20  Yes Dahal, Marlowe Aschoff, MD  metoprolol succinate (TOPROL-XL) 25 MG 24 hr tablet Take 25 mg by mouth daily.   Yes [provider]  Na Sulfate-K Sulfate-Mg Sulf 17.5-3.13-1.6 GM/177ML SOLN Take 2 kits by mouth as directed. 2 Day Prep for Colonoscopy.  Pt  needs (2) Preps. Instructions provided to patient in mail and on phone. 03/18/22  Yes [name], MD  ranolazine [facility]) 1000 MG SR tablet Take 1 tablet by mouth twice daily 03/16/22  Yes Neoma Laming A, MD  rosuvastatin (CRESTOR) 20 MG tablet Take 20 mg by mouth daily.    Yes [provider]  tamsulosin (FLOMAX) 0.4 MG CAPS capsule Take 1 capsule (0.4 mg total) by mouth daily after breakfast. 08/08/20  Yes Dahal, Marlowe Aschoff, MD  venlafaxine XR (EFFEXOR-XR) 75 MG 24 hr capsule Take 225 mg by mouth daily with breakfast.   Yes [provider]  furosemide (LASIX) 20 MG tablet Take 1 tablet (20 mg total) by mouth daily as needed for fluid or edema. Patient not taking: Reported on 02/17/2021 08/08/20 09/07/20  Terrilee Croak, MD  lisinopril (ZESTRIL) 2.5 MG tablet Take 1 tablet (2.5 mg total) by mouth daily. Patient not taking: Reported on 02/17/2021 08/08/20 09/07/20  Terrilee Croak, MD  magnesium oxide (MAG-OX) 400 MG tablet Take 400 mg by mouth daily. Patient not taking: Reported on 02/19/2021    [provider]  metFORMIN (GLUCOPHAGE) 1000 MG tablet Take 1,000 mg by mouth 2 (two) times daily with a meal. Patient not taking: Reported on 09/01/2021 03/31/20   [provider]  pantoprazole (PROTONIX) 40 MG tablet Take 40 mg by mouth daily. Patient not taking: Reported on 02/15/2022 07/11/19   [provider]    Allergies as of 03/18/2022 - Review Complete 02/15/2022  Allergen Reaction Noted   Bee venom Anaphylaxis and Swelling 06/13/2011   Questran [cholestyramine] Other (See Comments) 10/01/2016    Family History  Problem Relation Age of Onset   Diabetes Brother    Diabetes Sister    Diabetes Father    Hypertension Father    Diabetes Mother    Hypertension Mother    Hypertension Sister    Hypertension Brother     Social History   Socioeconomic History   Marital status: Legally Separated    Spouse name: Not on file   Number of children: Not on file    Years of education: Not on file   Highest education level: Not on file  Occupational History   Not on file  Tobacco Use   Smoking status: Former    Packs/day: 1.00    Years: 35.00    Additional pack years: 0.00    Total pack years: 35.00    Types: Cigarettes    Quit date: 01/25/2009    Years since quitting: 13.2   Smokeless tobacco: Never  Vaping Use   Vaping Use: Never used  Substance and Sexual Activity   Alcohol use: No    Comment: former alcoholic   Drug use: No    Comment: former cocaine abuse   Sexual activity: Not Currently  Other Topics Concern   Not on file  Social History Narrative  Not on file   Social Determinants of Health   Financial Resource Strain: Not on file  Food Insecurity: Not on file  Transportation Needs: Not on file  Physical Activity: Not on file  Stress: Not on file  Social Connections: Not on file  Intimate Partner Violence: Not on file    Review of Systems: See HPI, otherwise negative ROS  Physical Exam: BP (!) 140/90   Pulse 63   Temp (!) 96.3 F (35.7 C) (Temporal)   Resp 20   Ht 6\' 3"  (1.905 m)   Wt 87.2 kg   SpO2 100%   BMI 24.02 kg/m  General:   Alert,  pleasant and cooperative in NAD Head:  Normocephalic and atraumatic. Neck:  Supple; no masses or thyromegaly. Lungs:  Clear throughout to auscultation, normal respiratory effort.    Heart:  +S1, +S2, Regular rate and rhythm, No edema. Abdomen:  Soft, nontender and nondistended. Normal bowel sounds, without guarding, and without rebound.   Neurologic:  Alert and  oriented x4;  grossly normal neurologically.  Impression/Plan: [name] is here for an colonoscopy to be performed for Screening colonoscopy average risk   Risks, benefits, limitations, and alternatives regarding  colonoscopy have been reviewed with the patient.  Questions have been answered.  All parties agreeable.   [name], MD  04/16/2022, 10:59 AM

## 2022-04-16 NOTE — Transfer of Care (Signed)
Immediate Anesthesia Transfer of Care Note  Patient: OCTAVIOUS HEIKE  Procedure(s) Performed: Procedure(s): COLONOSCOPY WITH PROPOFOL (N/A)  Patient Location: PACU and Endoscopy Unit  Anesthesia Type:General  Level of Consciousness: sedated  Airway & Oxygen Therapy: Patient Spontanous Breathing and Patient connected to nasal cannula oxygen  Post-op Assessment: Report given to RN and Post -op Vital signs reviewed and stable  Post vital signs: Reviewed and stable  Last Vitals:  Vitals:   04/16/22 1025 04/16/22 1127  BP: (!) 140/90 127/78  Pulse: 63 (!) 55  Resp: 20 14  Temp: (!) 35.7 C (!) 35.7 C  SpO2: 123XX123 [id number]    Complications: No apparent anesthesia complications

## 2022-04-16 NOTE — Op Note (Signed)
[facility] Gastroenterology Patient Name: [name] Procedure Date: 04/16/2022 11:01 AM MRN: [id number] Account #: [id number] Date of Birth: January 06, 1961 Admit Type: Outpatient Age: 62 Room: [facility] ENDO ROOM 4 Gender: Male Note Status: Finalized Instrument Name: Jasper Riling [id number] Procedure:             Colonoscopy Indications:           Screening for colorectal malignant neoplasm,                         inadequate bowel prep on last colonoscopy (more recent                         than 10 years ago) Providers:             [name] MD, MD Referring MD:          No Local Md, MD (Referring MD) Medicines:             Monitored Anesthesia Care Complications:         No immediate complications. Procedure:             Pre-Anesthesia Assessment:                        - Prior to the procedure, a History and Physical was                         performed, and patient medications, allergies and                         sensitivities were reviewed. The patient's tolerance                         of previous anesthesia was reviewed.                        - The risks and benefits of the procedure and the                         sedation options and risks were discussed with the                         patient. All questions were answered and informed                         consent was obtained.                        - ASA Grade Assessment: II - A patient with mild                         systemic disease.                        After obtaining informed consent, the colonoscope was                         passed under direct vision. Throughout the procedure,                         the patient's  blood pressure, pulse, and oxygen                         saturations were monitored continuously. The                         Colonoscope was introduced through the anus and                         advanced to the the cecum, identified by the                          appendiceal orifice. The colonoscopy was performed                         with ease. The patient tolerated the procedure well.                         The quality of the bowel preparation was good. The                         ileocecal valve, appendiceal orifice, and rectum were                         photographed. Findings:      The perianal and digital rectal examinations were normal.      A 5 mm polyp was found in the ascending colon. The polyp was sessile.       The polyp was removed with a cold snare. Resection and retrieval were       complete.      The exam was otherwise without abnormality on direct and retroflexion       views. Impression:            - One 5 mm polyp in the ascending colon, removed with                         a cold snare. Resected and retrieved.                        - The examination was otherwise normal on direct and                         retroflexion views. Recommendation:        - Discharge patient to home (with escort).                        - Resume previous diet.                        - Continue present medications.                        - Await pathology results.                        - Repeat colonoscopy for surveillance based on                         pathology results. Procedure Code(s):     --- Professional ---  45385, Colonoscopy, flexible; with removal of                         tumor(s), polyp(s), or other lesion(s) by snare                         technique Diagnosis Code(s):     --- Professional ---                        Z12.11, Encounter for screening for malignant neoplasm                         of colon                        D12.2, Benign neoplasm of ascending colon CPT copyright 2022 American Medical Association. All rights reserved. The codes documented in this report are preliminary and upon coder review may  be revised to meet current compliance requirements. [name], MD [name] MD,  MD 04/16/2022 11:23:09 AM This report has been signed electronically. Number of Addenda: 0 Note Initiated On: 04/16/2022 11:01 AM Scope Withdrawal Time: 0 hours 9 minutes 8 seconds  Total Procedure Duration: 0 hours 11 minutes 52 seconds  Estimated Blood Loss:  Estimated blood loss: none.      [facility]

## 2022-04-16 NOTE — Anesthesia Preprocedure Evaluation (Signed)
Anesthesia Evaluation  Patient identified by MRN, date of birth, ID band Patient awake    Reviewed: Allergy & Precautions, NPO status , Patient's Chart, lab work & pertinent test results  Airway Mallampati: II  TM Distance: >3 FB Neck ROM: Full    Dental  (+) Upper Dentures, Lower Dentures   Pulmonary neg pulmonary ROS, asthma , former smoker   Pulmonary exam normal  + decreased breath sounds      Cardiovascular Exercise Tolerance: Good hypertension, Pt. on medications + CAD, + Past MI and + CABG  negative cardio ROS Normal cardiovascular exam Rhythm:Regular Rate:Normal     Neuro/Psych  Headaches   Depression    TIAnegative neurological ROS  negative psych ROS   GI/Hepatic negative GI ROS, Neg liver ROS,,,  Endo/Other  negative endocrine ROSdiabetes    Renal/GU CRFRenal diseasenegative Renal ROS  negative genitourinary   Musculoskeletal  (+) Arthritis ,    Abdominal   Peds negative pediatric ROS (+)  Hematology negative hematology ROS (+)   Anesthesia Other Findings Past Medical History: No date: Asthma No date: Cancer [facility])     Comment:  prostate No date: Chronic back pain No date: Chronic neck pain No date: Cirrhosis of liver ([place]) [id number]: Complicated migraine No date: Coronary artery disease No date: Depression No date: Diabetes mellitus without complication (HCC) No date: Gallstones No date: Headache(784.0) No date: Hepatitis C No date: Hypercholesteremia No date: Hypertension No date: Left leg paresthesias No date: Myocardial infarction ([place]) No date: Stroke [facility])     Comment:  TIA No date: Vertigo  Past Surgical History: No date: BYPASS GRAFT     Comment:  triple bypass surgery No date: CARDIAC SURGERY 02/15/2017: COLONOSCOPY WITH PROPOFOL; N/A     Comment:  Procedure: COLONOSCOPY WITH PROPOFOL;  Surgeon:               Virgel Manifold, MD;  Location: [place];               Service: Endoscopy;  Laterality: N/A; 02/15/2022: COLONOSCOPY WITH PROPOFOL; N/A     Comment:  Procedure: COLONOSCOPY WITH PROPOFOL;  Surgeon: [name], MD;  Location: [facility] ENDOSCOPY;  Service:               Gastroenterology;  Laterality: N/A; No date: CORONARY ANGIOPLASTY No date: CORONARY ARTERY BYPASS GRAFT 02/15/2017: ESOPHAGOGASTRODUODENOSCOPY (EGD) WITH PROPOFOL; N/A     Comment:  Procedure: ESOPHAGOGASTRODUODENOSCOPY (EGD) WITH               PROPOFOL;  Surgeon: Virgel Manifold, MD;  Location:               [place];  Service: Endoscopy;  Laterality:               N/A; 1984: HAND SURGERY 05/04/2021: KNEE ARTHROPLASTY; Left 04/29/2017: LEFT HEART CATH AND CORONARY ANGIOGRAPHY; Left     Comment:  Procedure: LEFT HEART CATH AND CORONARY ANGIOGRAPHY;                Surgeon: Dionisio David, MD;  Location: [place] CV              LAB;  Service: Cardiovascular;  Laterality: Left; 01/08/2019: LEFT HEART CATH AND CORONARY ANGIOGRAPHY; Left     Comment:  Procedure: LEFT HEART CATH AND CORONARY ANGIOGRAPHY;  Surgeon: Dionisio David, MD;  Location: [place] [facility]              LAB;  Service: Cardiovascular;  Laterality: Left; 02/19/2021: LEFT HEART CATH AND CORONARY ANGIOGRAPHY; N/A     Comment:  Procedure: LEFT HEART CATH AND CORONARY ANGIOGRAPHY with              intervention;  Surgeon: Dionisio David, MD;  Location:               [place] [facility];  Service: Cardiovascular;                Laterality: N/A; No date: ORTHOPEDIC SURGERY     Comment:  "on my legs"  BMI    Body Mass Index: 24.02 kg/m      Reproductive/Obstetrics negative OB ROS                             Anesthesia Physical Anesthesia Plan  ASA: 3  Anesthesia Plan: General   Post-op Pain Management:    Induction: Intravenous  PONV Risk Score and Plan: Propofol infusion and TIVA  Airway Management Planned: Natural  Airway  Additional Equipment:   Intra-op Plan:   Post-operative Plan:   Informed Consent: I have reviewed the patients History and Physical, chart, labs and discussed the procedure including the risks, benefits and alternatives for the proposed anesthesia with the patient or authorized representative who has indicated his/her understanding and acceptance.     Dental Advisory Given  Plan Discussed with: CRNA and Surgeon  Anesthesia Plan Comments:        Anesthesia Quick Evaluation

## 2022-04-16 NOTE — Anesthesia Postprocedure Evaluation (Signed)
Anesthesia Post Note  Patient: [name]  Procedure(s) Performed: COLONOSCOPY WITH PROPOFOL  Patient location during evaluation: PACU Anesthesia Type: General Level of consciousness: awake and awake and alert Pain management: satisfactory to patient Vital Signs Assessment: post-procedure vital signs reviewed and stable Respiratory status: nonlabored ventilation Cardiovascular status: blood pressure returned to baseline Anesthetic complications: no   No notable events documented.   Last Vitals:  Vitals:   04/16/22 1025 04/16/22 1127  BP: (!) 140/90 127/78  Pulse: 63 (!) 55  Resp: 20 14  Temp: (!) 35.7 C (!) 35.7 C  SpO2: 100% 100%    Last Pain:  Vitals:   04/16/22 1127  TempSrc: Temporal  PainSc: Asleep                 VAN STAVEREN,Kindra Bickham

## 2022-04-17 NOTE — Progress Notes (Signed)
Voicemail.  No Message Left. 

## 2022-04-19 ENCOUNTER — Encounter: Payer: Self-pay | Admitting: Gastroenterology

## 2022-04-19 LAB — SURGICAL PATHOLOGY

## 2022-04-20 ENCOUNTER — Encounter: Payer: Self-pay | Admitting: Gastroenterology

## 2022-04-21 ENCOUNTER — Other Ambulatory Visit: Payer: Self-pay | Admitting: Cardiovascular Disease

## 2022-05-17 ENCOUNTER — Ambulatory Visit: Payer: Medicaid Other | Admitting: Cardiovascular Disease

## 2022-05-17 ENCOUNTER — Encounter: Payer: Self-pay | Admitting: Cardiovascular Disease

## 2022-05-17 VITALS — BP 110/68 | HR 62 | Ht 75.0 in | Wt 199.8 lb

## 2022-05-17 DIAGNOSIS — I2510 Atherosclerotic heart disease of native coronary artery without angina pectoris: Secondary | ICD-10-CM | POA: Diagnosis not present

## 2022-05-17 DIAGNOSIS — E7800 Pure hypercholesterolemia, unspecified: Secondary | ICD-10-CM

## 2022-05-17 DIAGNOSIS — Z951 Presence of aortocoronary bypass graft: Secondary | ICD-10-CM

## 2022-05-17 DIAGNOSIS — I10 Essential (primary) hypertension: Secondary | ICD-10-CM | POA: Diagnosis not present

## 2022-05-17 NOTE — Assessment & Plan Note (Signed)
Well controlled. Continue current medications  

## 2022-05-17 NOTE — Assessment & Plan Note (Signed)
Patient feeling well. Denies chest pain, shortness of breath, dizziness.

## 2022-05-17 NOTE — Progress Notes (Signed)
Cardiology Office Note   Date:  05/17/2022   ID:  [name], DOB September 25, 1960, MRN [id number]  PCP:  [facility]  Cardiologist:  Adrian Blackwater, MD      History of Present Illness: [name] is a 62 y.o. male who presents for  Chief Complaint  Patient presents with   Follow-up    4 month follow up    Patient in office for routine cardiac exam. Denies chest pain, shortness of breath, edema, palpitations.    Past Medical History:  Diagnosis Date   Asthma    Cancer    prostate   Chronic back pain    Chronic neck pain    Cirrhosis of liver    Complicated migraine 06/16/2011   Coronary artery disease    Depression    Diabetes mellitus without complication    Gallstones    Headache(784.0)    Hepatitis C    Hypercholesteremia    Hypertension    Left leg paresthesias    Myocardial infarction    Stroke    TIA   Vertigo      Past Surgical History:  Procedure Laterality Date   BYPASS GRAFT     triple bypass surgery   CARDIAC SURGERY     COLONOSCOPY WITH PROPOFOL N/A 02/15/2017   Procedure: COLONOSCOPY WITH PROPOFOL;  Surgeon: Pasty Spillers, MD;  Location: [facility];  Service: Endoscopy;  Laterality: N/A;   COLONOSCOPY WITH PROPOFOL N/A 02/15/2022   Procedure: COLONOSCOPY WITH PROPOFOL;  Surgeon: Wyline Mood, MD;  Location: [facility] ENDOSCOPY;  Service: Gastroenterology;  Laterality: N/A;   COLONOSCOPY WITH PROPOFOL N/A 04/16/2022   Procedure: COLONOSCOPY WITH PROPOFOL;  Surgeon: Wyline Mood, MD;  Location: [facility] ENDOSCOPY;  Service: Gastroenterology;  Laterality: N/A;   CORONARY ANGIOPLASTY     CORONARY ARTERY BYPASS GRAFT     ESOPHAGOGASTRODUODENOSCOPY (EGD) WITH PROPOFOL N/A 02/15/2017   Procedure: ESOPHAGOGASTRODUODENOSCOPY (EGD) WITH PROPOFOL;  Surgeon: Pasty Spillers, MD;  Location: [facility];  Service: Endoscopy;  Laterality: N/A;   HAND SURGERY  1984   KNEE ARTHROPLASTY Left 05/04/2021   LEFT HEART  CATH AND CORONARY ANGIOGRAPHY Left 04/29/2017   Procedure: LEFT HEART CATH AND CORONARY ANGIOGRAPHY;  Surgeon: Laurier Nancy, MD;  Location: [facility] INVASIVE CV LAB;  Service: Cardiovascular;  Laterality: Left;   LEFT HEART CATH AND CORONARY ANGIOGRAPHY Left 01/08/2019   Procedure: LEFT HEART CATH AND CORONARY ANGIOGRAPHY;  Surgeon: Laurier Nancy, MD;  Location: [facility] INVASIVE CV LAB;  Service: Cardiovascular;  Laterality: Left;   LEFT HEART CATH AND CORONARY ANGIOGRAPHY N/A 02/19/2021   Procedure: LEFT HEART CATH AND CORONARY ANGIOGRAPHY with intervention;  Surgeon: Laurier Nancy, MD;  Location: [facility] INVASIVE CV LAB;  Service: Cardiovascular;  Laterality: N/A;   ORTHOPEDIC SURGERY     "on my legs"     Current Outpatient Medications  Medication Sig Dispense Refill   albuterol (VENTOLIN HFA) 108 (90 Base) MCG/ACT inhaler Inhale 2 puffs into the lungs every 6 (six) hours as needed for wheezing or shortness of breath. 8 g 1   amLODipine (NORVASC) 5 MG tablet Take 1 tablet (5 mg total) by mouth daily. 30 tablet 0   aspirin EC 81 MG tablet Take 81 mg by mouth daily.     Calcium Carbonate-Vitamin D 600-400 MG-UNIT tablet Take 1 tablet by mouth 2 (two) times daily.      clopidogrel (PLAVIX) 75 MG tablet Take 75 mg by mouth daily.  ezetimibe (ZETIA) 10 MG tablet Take 10 mg by mouth daily.     famotidine (PEPCID) 40 MG tablet Take 40 mg by mouth 2 (two) times daily.     finasteride (PROSCAR) 5 MG tablet Take 5 mg by mouth daily.     fluticasone (FLONASE) 50 MCG/ACT nasal spray Place 2 sprays into both nostrils daily as needed for allergies or rhinitis.     furosemide (LASIX) 20 MG tablet Take 1 tablet (20 mg total) by mouth daily as needed for fluid or edema. 30 tablet 0   Glycerin-Hypromellose-PEG 400 (DRY EYE RELIEF DROPS) 0.2-0.2-1 % SOLN Place 1 drop into both eyes daily as needed (Dry eye).     insulin glargine (LANTUS) 100 UNIT/ML injection Inject 0.25 mLs (25 Units total) into the skin at  bedtime. 10 mL 11   magnesium oxide (MAG-OX) 400 MG tablet Take 400 mg by mouth daily.     metoprolol succinate (TOPROL-XL) 25 MG 24 hr tablet Take 1 tablet by mouth once daily 90 tablet 0   pantoprazole (PROTONIX) 40 MG tablet Take 40 mg by mouth daily.     ranolazine (RANEXA) 1000 MG SR tablet Take 1 tablet by mouth twice daily 180 tablet 0   rosuvastatin (CRESTOR) 20 MG tablet Take 20 mg by mouth daily.      tamsulosin (FLOMAX) 0.4 MG CAPS capsule Take 1 capsule (0.4 mg total) by mouth daily after breakfast. 30 capsule    venlafaxine XR (EFFEXOR-XR) 75 MG 24 hr capsule Take 225 mg by mouth daily with breakfast.     No current facility-administered medications for this visit.    Allergies:   Bee venom and Questran [cholestyramine]    Social History:   reports that he quit smoking about 13 years ago. His smoking use included cigarettes. He has a 35.00 pack-year smoking history. He has never used smokeless tobacco. He reports that he does not drink alcohol and does not use drugs.   Family History:  family history includes Diabetes in his brother, father, mother, and sister; Hypertension in his brother, father, mother, and sister.    ROS:     Review of Systems  Constitutional: Negative.   HENT: Negative.    Eyes: Negative.   Respiratory: Negative.    Cardiovascular: Negative.   Gastrointestinal: Negative.   Genitourinary: Negative.   Musculoskeletal: Negative.   Skin: Negative.   Neurological: Negative.   Endo/Heme/Allergies: Negative.   Psychiatric/Behavioral: Negative.    All other systems reviewed and are negative.   All other systems are reviewed and negative.   PHYSICAL EXAM: VS:  BP 110/68 (BP Location: Left Arm, Patient Position: Sitting, Cuff Size: Large)   Pulse 62   Ht 6\' 3"  (1.905 m)   Wt 199 lb 12.8 oz (90.6 kg)   SpO2 97%   BMI 24.97 kg/m  , BMI Body mass index is 24.97 kg/m. Last weight:  Wt Readings from Last 3 Encounters:  05/17/22 199 lb 12.8 oz (90.6  kg)  04/16/22 192 lb 3.2 oz (87.2 kg)  02/15/22 200 lb 6.4 oz (90.9 kg)    Physical Exam Vitals reviewed.  Constitutional:      Appearance: Normal appearance. He is normal weight.  HENT:     Head: Normocephalic.     Nose: Nose normal.     Mouth/Throat:     Mouth: Mucous membranes are moist.  Eyes:     Pupils: Pupils are equal, round, and reactive to light.  Cardiovascular:     Rate  and Rhythm: Normal rate and regular rhythm.     Pulses: Normal pulses.     Heart sounds: Normal heart sounds.  Pulmonary:     Effort: Pulmonary effort is normal.  Abdominal:     General: Abdomen is flat. Bowel sounds are normal.  Musculoskeletal:        General: Normal range of motion.     Cervical back: Normal range of motion.  Skin:    General: Skin is warm.  Neurological:     General: No focal deficit present.     Mental Status: He is alert.  Psychiatric:        Mood and Affect: Mood normal.     EKG: none today  Recent Labs: 05/18/2021: BUN 18; Creatinine, Ser 1.06; Hemoglobin 11.5; Platelets 311; Potassium 4.0; Sodium 138    Lipid Panel    Component Value Date/Time   CHOL 184 06/14/2011 0443   TRIG 123 04/02/2019 2304   HDL 27 (L) 06/14/2011 0443   CHOLHDL 6.8 06/14/2011 0443   VLDL 39 06/14/2011 0443   LDLCALC 118 (H) 06/14/2011 0443      Other studies Reviewed: Patient: [id number] - Lewayne L. Jha DOB:  02-Feb-1960  Date:  01/13/2022 08:45 Provider: Adrian Blackwater MD Encounter: ECHO   Page 2 REASON FOR VISIT  Visit for: Echocardiogram/R06.02  Sex:   Male         wt=206    lbs.  BP=  Height= 75   inches.   TESTS  Imaging: Echocardiogram:  An echocardiogram in (2-d) mode was performed and in Doppler mode with color flow velocity mapping was performed. The aortic valve cusps are abnormal 1.7  cm, flow velocity 1.3   m/s, and systolic calculated mean flow gradient 3  mmHg. Mitral valve diastolic peak flow velocity E 1.1   m/s and E/A ratio 1.5. Aortic root diameter  3.6  cm. The LVOT internal diameter 2.0  cm and flow velocity was abnormal 1.0    m/s. LV systolic dimension 3.3  cm, diastolic 4.8  cm, posterior wall thickness 1.3   cm, fractional shortening 20 %, and EF 55  %. IVS thickness 1.3  cm. LA dimension 4.9 cm  RIGHT atrium= 17.9   cm2. Mitral Valve has Trace Regurgitation. Aortic Valve has Trace Regurgitation. Tricuspid Valve has Trace Regurgitation.     ASSESSMENT  Technically adequate study.  Normal left ventricular systolic function.  Normal right ventricular systolic function.  Normal right ventricular diastolic function.  Normal left ventricular wall motion.  Normal right ventricular wall motion.  Trace tricuspid regurgitation.  Normal pulmonary artery pressure.  Trace mitral regurgitation.  Trace aortic regurgitation.  No pericardial effusion.  Mildly dilated Right atrium and ventricle  Mild LVH.   THERAPY   Referring physician: Laurier Nancy  Sonographer: STU.   Adrian Blackwater MD  Electronically signed by: Adrian Blackwater     Date: 01/15/2022 10:22  Patient: [id number] - Maisie Fus L. Barsanti DOB:  30-May-1960  Date:  09/12/2020 10:00 Provider: Adrian Blackwater MD Encounter: ALL ANGIOGRAMS (CTA BRAIN, CAROTIDS, RENAL ARTERIES, PE)   Page 2 REASON FOR VISIT  Referred by Dr.Krystine Pabst [place] Flakes.    TESTS  Imaging: Computed Tomographic Angiography:  Cardiac multidetector CT was performed paying particular attention to the coronary arteries for the diagnosis of: Chest Pain. Diagnostic Drugs:  Administered iohexol (Omnipaque) through an antecubital vein and images from the examination were analyzed for the presence and extent of coronary artery disease, using 3D image processing software. 100 mL  of non-ionic contrast (Omnipaque) was used.   ASSESSMENT   The proximal right coronary artery (RCA) was abnormal:2  The mid right coronary artery (RCA) was abnormal:2  The distal right coronary artery (RCA) was abnormal:4  The posterior  descending coronary arteries were abnormal:1  The LIMA origin was visualized To LAD patent  The SVG origin was visualized To OM patent  To PDA occluded     TEST CONCLUSIONS  Quality of study: Good  1-Calcium score: 3820.7  2-Right dominant system.  3-LIMA to LAD and SVG to OM are patent. SVG to PDA is occluded. Native RCA has distal high grade lesion with SVG to PDA occluded. Consider Cath if continues to have chest pain.   Adrian Blackwater MD  Electronically signed by: Adrian Blackwater     Date: 09/12/2020 15:19  Patient: [id number] - Maisie Fus L. Disano DOB:  May 07, 1960  Date:  08/26/2020 14:02 Provider: Adrian Blackwater MD Encounter: NUCLEAR STRESS TEST   Page 1 TESTS    [facility] [address] [place], [place] [address] [phone] STUDY:  Gated Stress / Rest Myocardial Perfusion Imaging Tomographic (SPECT) Including attenuation correction Wall Motion, Left Ventricular Ejection Fraction By Gated Technique.Persantine Stress Test. SEX: Male    WEIGHT: 243 lbs   HEIGHT: 75 in      ARMS UP: YES/NO                                                                                                                                                                                REFERRING PHYSICIAN:  Dr.Rogene Meth [place] Flakes  INDICATION FOR STUDY:  SOB                                                                                                                                                                                                                   TECHNIQUE:  Approximately 20 minutes following the intravenous administration of 9.8 mCi of Tc-56m Sestamibi after stress testing in a reclined supine position with arms above their head if able to do so, gated SPECT imaging of  the heart was performed. After about a 2hr break, the patient was injected intravenously with 31.6 mCi of Tc-74m Sestamibi.  Approximately 45 minutes later in the same position as stress imaging SPECT rest imaging of the heart was performed.  STRESS BY:  Adrian Blackwater, MD PROTOCOL:  Persantine   DOSE ADMIN: 10 cc   ROUTE OF ADMINISTRATION: IV                                                                            MAX PRED HR: 159                     85%: 135               75%: 119                                                                                                                   RESTING BP: 142/88   RESTING HR: 56  PEAK BP: 134/80    PEAK HR: 70  EXERCISE DURATION:    4 min injection                                            REASON FOR TEST TERMINATION:    Protocol end                                                                                                                              SYMPTOMS:   None                                                                                                                                                                                                          EKG RESULTS: NSR. 56/min. RBBB. No significant ST changes with persantine                                                            IMAGE QUALITY:   Good  PERFUSION/WALL MOTION FINDINGS:  EF = 53%. Dilated LV. Small mild reversible apical anterior, apical lateral, and apex (17) wall defects, normal wall motion.                                                                         IMPRESSION:  Possible  ischemia in the LAD/LCX territories with mild LV dysfunction dilated LV, advise CCTA.                                                                                                                                                                                                                                                                                       Adrian Blackwater, MD Stress Interpreting Physician / Nuclear Interpreting Physician  Adrian Blackwater MD  Electronically signed by: Adrian Blackwater     Date: 08/29/2020 10:06   ASSESSMENT AND PLAN:    ICD-10-CM   1. Coronary artery disease involving native coronary artery of native heart without angina pectoris  I25.10     2. Primary hypertension  I10     3. Hypercholesteremia  E78.00     4. S/P CABG (coronary artery bypass graft)  Z95.1        Problem List Items Addressed This Visit       Cardiovascular and Mediastinum   CAD (coronary artery disease) - Primary (Chronic)    Patient feeling well. Denies chest pain, shortness of breath, dizziness.       Relevant Medications   ezetimibe (ZETIA) 10 MG tablet   Hypertension    Well controlled. Continue current medications.       Relevant Medications   ezetimibe (ZETIA) 10 MG tablet     Other   Hypercholesteremia    [facility] currently unavailable.       Relevant Medications   ezetimibe (ZETIA) 10 MG tablet   S/P CABG (coronary artery  bypass graft)     Disposition:   Return in about 6 months (around 11/16/2022).    Total time spent: 30 minutes  Signed,  Adrian Blackwater, MD  05/17/2022 9:52 AM    [facility]

## 2022-05-17 NOTE — Assessment & Plan Note (Signed)
Monitored by the [place]. Recent numbers currently unavailable.

## 2022-05-31 IMAGING — DX DG CHEST 1V PORT
1 series · 1 of 1 positions shown · non-contrast
Comparison: Chest x-ray 12/19/2019.

CLINICAL DATA: 60-year-old male with history of cough, sneezing,
runny nose and dizziness.

EXAM:
PORTABLE CHEST 1 VIEW

[chest ap]
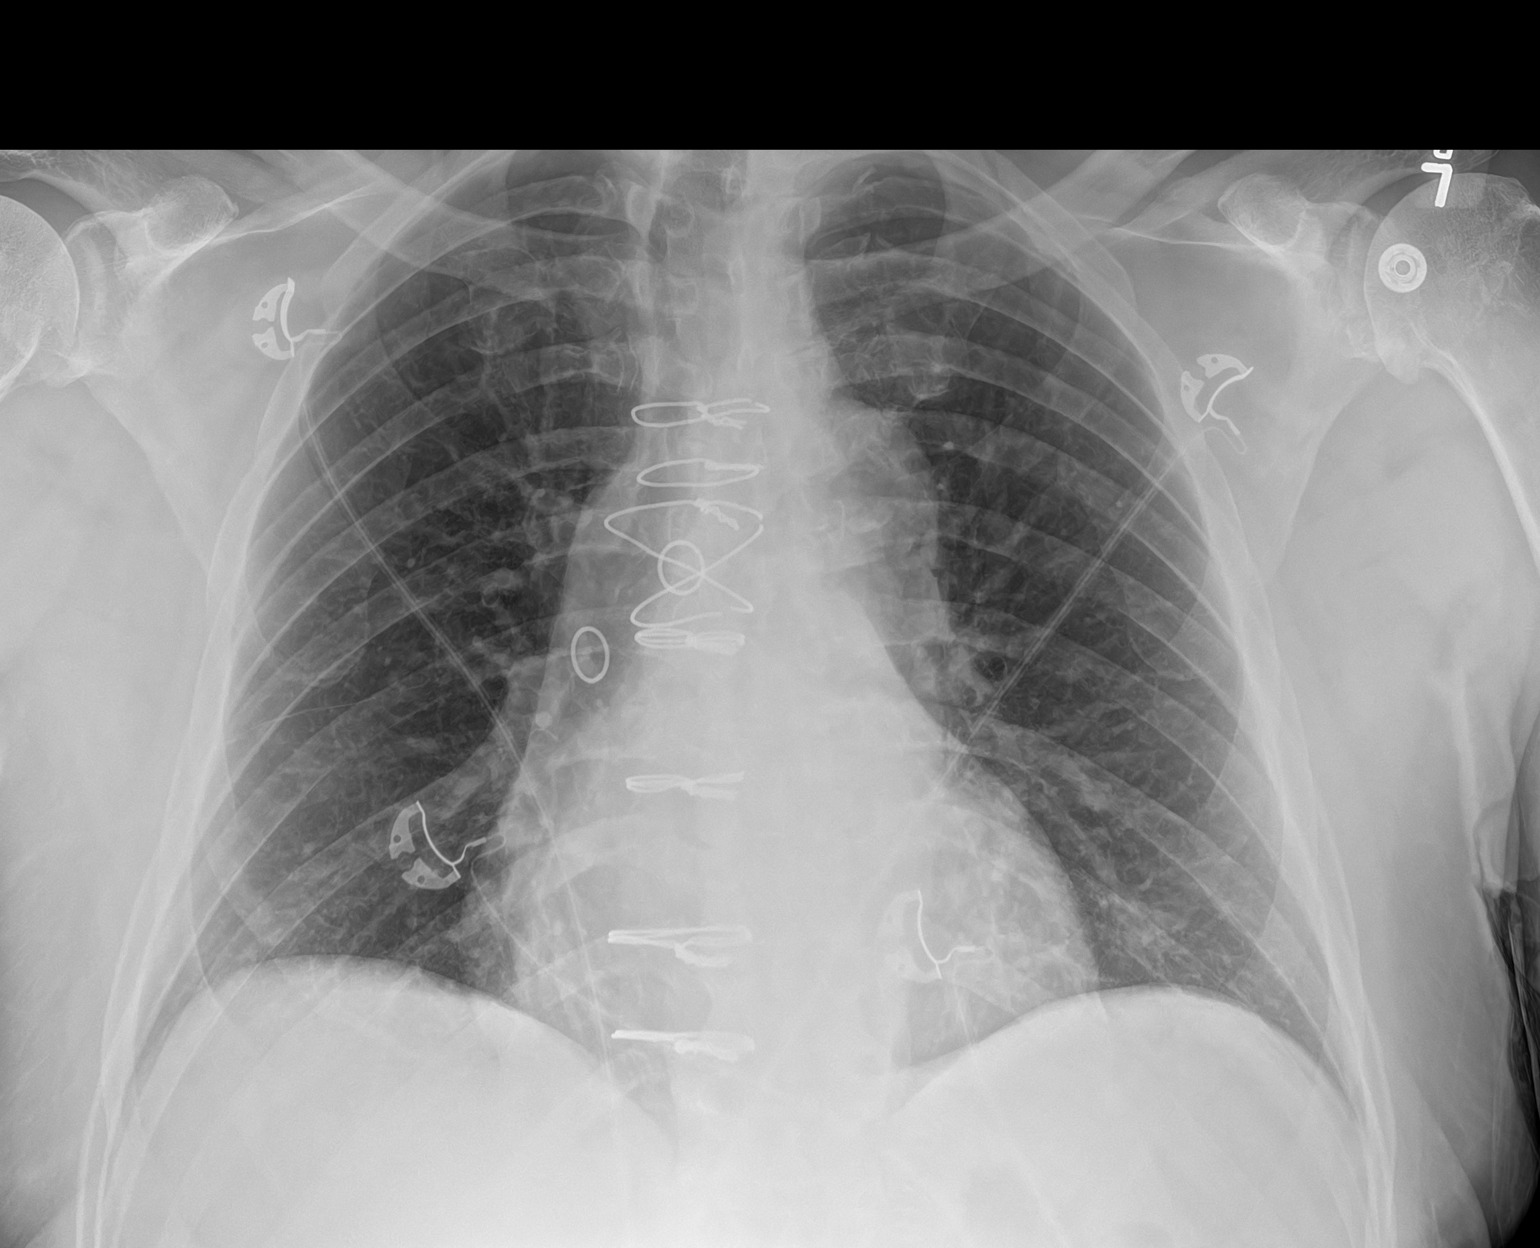

[1 of 1 positions shown; findings below may reference images not displayed]

FINDINGS: Lung volumes are normal. No consolidative airspace disease. No
pleural effusions. No pneumothorax. No pulmonary nodule or mass
noted. Pulmonary vasculature and the cardiomediastinal silhouette
are within normal limits. Atherosclerosis in the thoracic aorta.
Status post median sternotomy for CABG.
IMPRESSION: 1.  No radiographic evidence of acute cardiopulmonary disease.
2. Aortic atherosclerosis.

## 2022-06-14 ENCOUNTER — Other Ambulatory Visit: Payer: Self-pay | Admitting: Cardiovascular Disease

## 2022-07-06 ENCOUNTER — Other Ambulatory Visit: Payer: Self-pay | Admitting: Cardiovascular Disease

## 2022-08-16 ENCOUNTER — Ambulatory Visit: Payer: Medicaid Other | Admitting: Internal Medicine

## 2022-08-16 VITALS — BP 130/75 | HR 57 | Ht 75.0 in | Wt 194.2 lb

## 2022-08-16 DIAGNOSIS — E1169 Type 2 diabetes mellitus with other specified complication: Secondary | ICD-10-CM

## 2022-08-16 DIAGNOSIS — N401 Enlarged prostate with lower urinary tract symptoms: Secondary | ICD-10-CM

## 2022-08-16 DIAGNOSIS — R351 Nocturia: Secondary | ICD-10-CM | POA: Diagnosis not present

## 2022-08-16 DIAGNOSIS — E785 Hyperlipidemia, unspecified: Secondary | ICD-10-CM

## 2022-08-16 LAB — POCT CBG (FASTING - GLUCOSE)-MANUAL ENTRY: Glucose Fasting, POC: 115 mg/dL — AB (ref 70–99)

## 2022-08-16 NOTE — Progress Notes (Signed)
Established Patient Office Visit  Subjective:  Patient ID: [name], male    DOB: Jun 27, 1960  Age: 62 y.o. MRN: [id number]  Chief Complaint  Patient presents with   Follow-up    Discuss meds    No new complaints, here for lab review and medication refills. Lost weight with diet and exercise.    No other concerns at this time.   Past Medical History:  Diagnosis Date   Asthma    Cancer [facility])    prostate   Chronic back pain    Chronic neck pain    Cirrhosis of liver (HCC)    Complicated migraine 06/16/2011   Coronary artery disease    Depression    Diabetes mellitus without complication (HCC)    Gallstones    Headache(784.0)    Hepatitis C    Hypercholesteremia    Hypertension    Left leg paresthesias    Myocardial infarction (HCC)    Stroke (HCC)    TIA   Vertigo     Past Surgical History:  Procedure Laterality Date   BYPASS GRAFT     triple bypass surgery   CARDIAC SURGERY     COLONOSCOPY WITH PROPOFOL N/A 02/15/2017   Procedure: COLONOSCOPY WITH PROPOFOL;  Surgeon: Pasty Spillers, MD;  Location: [facility]ERY [facility];  Service: Endoscopy;  Laterality: N/A;   COLONOSCOPY WITH PROPOFOL N/A 02/15/2022   Procedure: COLONOSCOPY WITH PROPOFOL;  Surgeon: Wyline Mood, MD;  Location: [facility] ENDOSCOPY;  Service: Gastroenterology;  Laterality: N/A;   COLONOSCOPY WITH PROPOFOL N/A 04/16/2022   Procedure: COLONOSCOPY WITH PROPOFOL;  Surgeon: Wyline Mood, MD;  Location: [facility] ENDOSCOPY;  Service: Gastroenterology;  Laterality: N/A;   CORONARY ANGIOPLASTY     CORONARY ARTERY BYPASS GRAFT     ESOPHAGOGASTRODUODENOSCOPY (EGD) WITH PROPOFOL N/A 02/15/2017   Procedure: ESOPHAGOGASTRODUODENOSCOPY (EGD) WITH PROPOFOL;  Surgeon: Pasty Spillers, MD;  Location: [facility] SURGERY [facility]TR;  Service: Endoscopy;  Laterality: N/A;   HAND SURGERY  1984   KNEE ARTHROPLASTY Left 05/04/2021   LEFT HEART CATH AND CORONARY ANGIOGRAPHY Left 04/29/2017   Procedure: LEFT HEART CATH  AND CORONARY ANGIOGRAPHY;  Surgeon: Laurier Nancy, MD;  Location: [facility] INVASIVE CV LAB;  Service: Cardiovascular;  Laterality: Left;   LEFT HEART CATH AND CORONARY ANGIOGRAPHY Left 01/08/2019   Procedure: LEFT HEART CATH AND CORONARY ANGIOGRAPHY;  Surgeon: Laurier Nancy, MD;  Location: [facility] INVASIVE CV LAB;  Service: Cardiovascular;  Laterality: Left;   LEFT HEART CATH AND CORONARY ANGIOGRAPHY N/A 02/19/2021   Procedure: LEFT HEART CATH AND CORONARY ANGIOGRAPHY with intervention;  Surgeon: Laurier Nancy, MD;  Location: [facility] INVASIVE CV LAB;  Service: Cardiovascular;  Laterality: N/A;   ORTHOPEDIC SURGERY     "on my legs"    Social History   Socioeconomic History   Marital status: Legally Separated    Spouse name: Not on file   Number of children: Not on file   Years of education: Not on file   Highest education level: Not on file  Occupational History   Not on file  Tobacco Use   Smoking status: Former    Current packs/day: 0.00    Average packs/day: 1 pack/day for 35.0 years (35.0 ttl pk-yrs)    Types: Cigarettes    Start date: 01/25/1974    Quit date: 01/25/2009    Years since quitting: 13.5   Smokeless tobacco: Never  Vaping Use   Vaping status: Never Used  Substance and Sexual Activity   Alcohol use:  No    Comment: former alcoholic   Drug use: No    Comment: former cocaine abuse   Sexual activity: Not Currently  Other Topics Concern   Not on file  Social History Narrative   Not on file   Social Determinants of Health   Financial Resource Strain: Not on file  Food Insecurity: Not on file  Transportation Needs: Not on file  Physical Activity: Not on file  Stress: Not on file  Social Connections: Not on file  Intimate Partner Violence: Not on file    Family History  Problem Relation Age of Onset   Diabetes Brother    Diabetes Sister    Diabetes Father    Hypertension Father    Diabetes Mother    Hypertension Mother    Hypertension Sister    Hypertension  Brother     Allergies  Allergen Reactions   Bee Venom Anaphylaxis and Swelling   Questran [Cholestyramine] Other (See Comments)    Reaction unknown    Review of Systems  Constitutional: Negative.   HENT: Negative.    Eyes: Negative.   Respiratory: Negative.    Cardiovascular: Negative.   Gastrointestinal: Negative.   Genitourinary: Negative.   Skin: Negative.   Neurological: Negative.   Endo/Heme/Allergies: Negative.        Objective:   BP 130/75   Pulse (!) 57   Ht 6\' 3"  (1.905 m)   Wt 194 lb 3.2 oz (88.1 kg)   SpO2 98%   BMI 24.27 kg/m   Vitals:   08/16/22 1124  BP: 130/75  Pulse: (!) 57  Height: 6\' 3"  (1.905 m)  Weight: 194 lb 3.2 oz (88.1 kg)  SpO2: 98%  BMI (Calculated): 24.27    Physical Exam Vitals reviewed.  Constitutional:      Appearance: Normal appearance.  HENT:     Head: Normocephalic.     Left Ear: There is no impacted cerumen.     Nose: Nose normal.     Mouth/Throat:     Mouth: Mucous membranes are moist.     Pharynx: No posterior oropharyngeal erythema.  Eyes:     Extraocular Movements: Extraocular movements intact.     Pupils: Pupils are equal, round, and reactive to light.  Cardiovascular:     Rate and Rhythm: Regular rhythm.     Chest Wall: PMI is not displaced.     Pulses: Normal pulses.     Heart sounds: S1 normal and S2 normal. Murmur heard.     Crescendo decrescendo systolic murmur is present with a grade of 3/6.  Pulmonary:     Effort: Pulmonary effort is normal.     Breath sounds: Normal air entry. No rhonchi or rales.  Abdominal:     General: Abdomen is flat. Bowel sounds are normal. There is no distension.     Palpations: Abdomen is soft. There is no hepatomegaly, splenomegaly or mass.     Tenderness: There is no abdominal tenderness.  Musculoskeletal:        General: Normal range of motion.     Cervical back: Normal range of motion and neck supple.     Right lower leg: No edema.     Left lower leg: No edema.   Skin:    General: Skin is warm and dry.  Neurological:     General: No focal deficit present.     Mental Status: He is alert and oriented to person, place, and time.     Cranial Nerves: No cranial  nerve deficit.     Motor: No weakness.  Psychiatric:        Mood and Affect: Mood normal.        Behavior: Behavior normal.      Results for orders placed or performed in visit on 08/16/22  POCT CBG (Fasting - Glucose)  Result Value Ref Range   Glucose Fasting, POC 115 (A) 70 - 99 mg/dL    Recent Results (from the past 2160 hour([facility]))  POCT CBG (Fasting - Glucose)     Status: Abnormal   Collection Time: 08/16/22 11:29 AM  Result Value Ref Range   Glucose Fasting, POC 115 (A) 70 - 99 mg/dL      Assessment & Plan:  As per problem list  Problem List Items Addressed This Visit       Endocrine   Type 2 diabetes mellitus with hyperlipidemia (HCC) - Primary   Relevant Orders   POCT CBG (Fasting - Glucose) (Completed)   CBC With Diff/Platelet   Comprehensive metabolic panel   Hemoglobin A1c   Lipid panel   Other Visit Diagnoses     Benign prostatic hyperplasia with nocturia       Relevant Orders   PSA       Return in about 3 months (around 11/16/2022) for cpe with labs prior.   Total time spent: 20 minutes  Luna Fuse, MD  08/16/2022   This document may have been prepared by [facility] Voice Recognition software and as such may include unintentional dictation errors.

## 2022-09-09 ENCOUNTER — Other Ambulatory Visit: Payer: Self-pay | Admitting: Cardiovascular Disease

## 2022-09-12 IMAGING — DX DG KNEE 1-2V*L*
2 series · 2 of 2 positions shown · non-contrast
Comparison: None.

CLINICAL DATA: Pain and drainage. Swelling and oozing from
incision. Left knee surgery on [REDACTED] at [REDACTED].

EXAM:
LEFT KNEE - 1-2 VIEW

[knee ap]
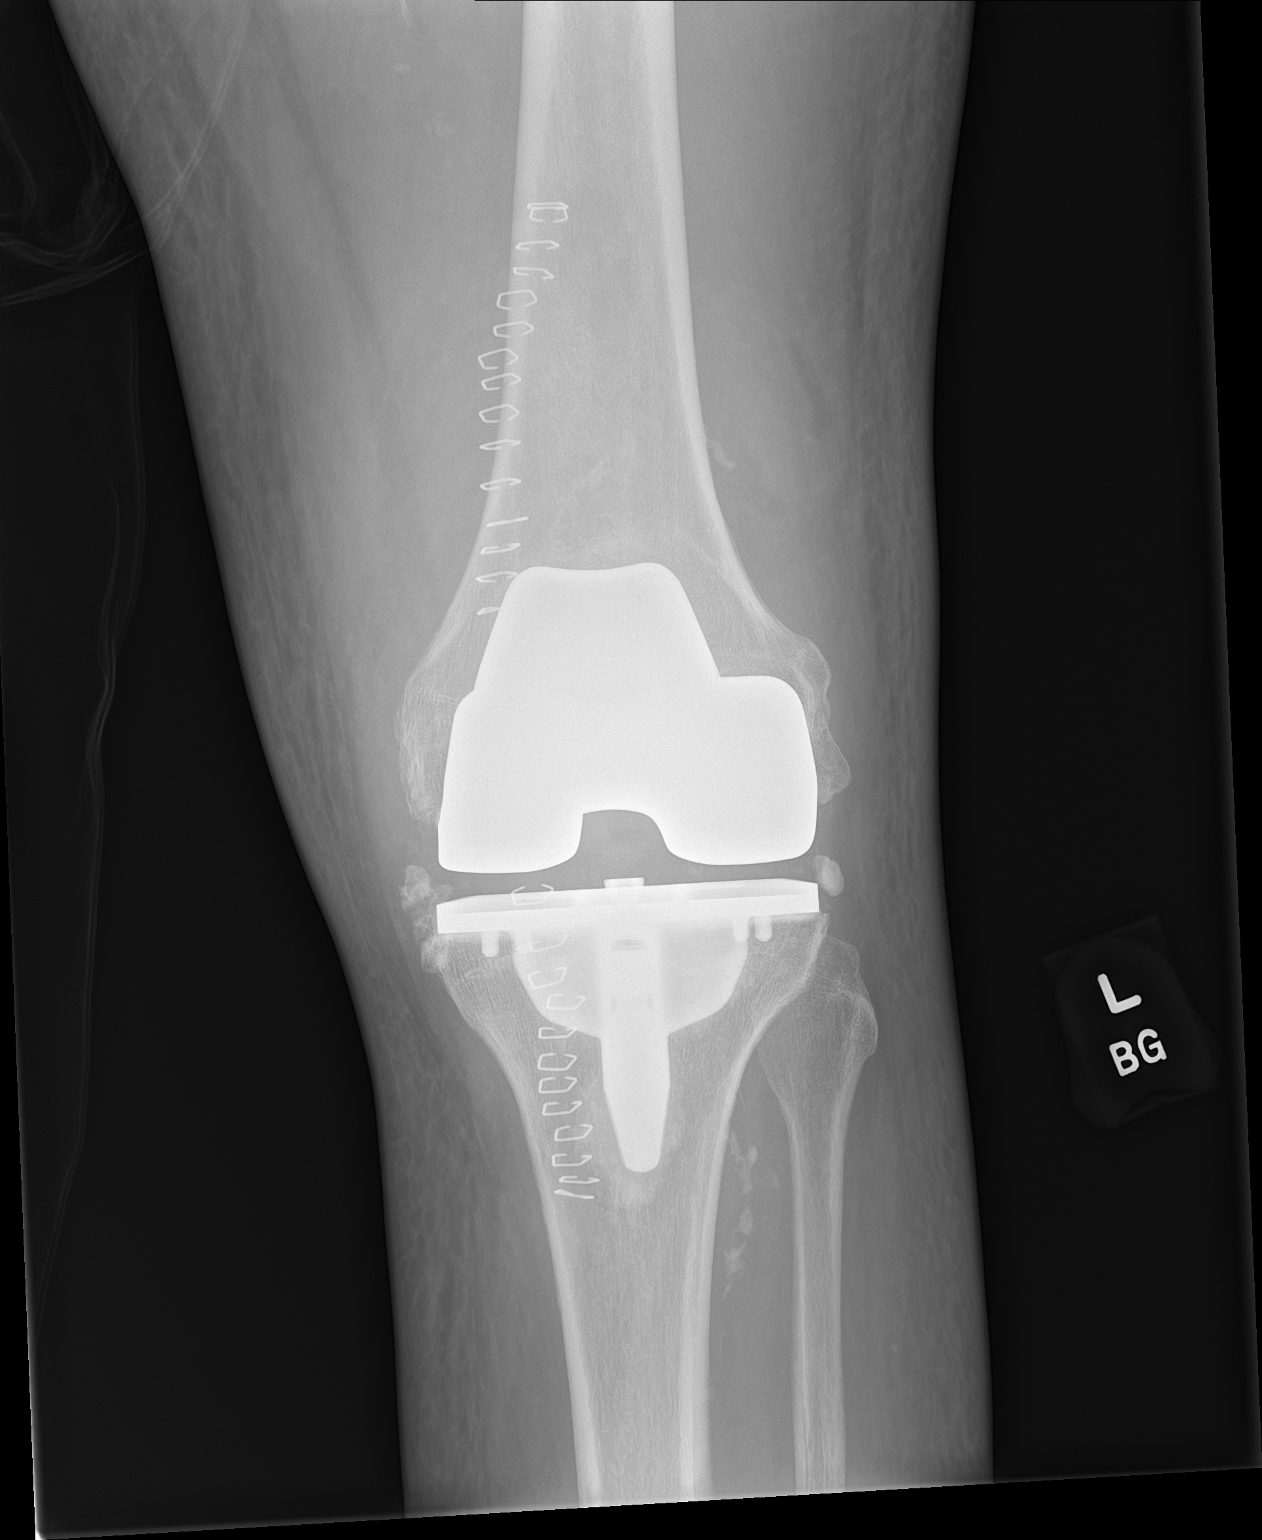

[knee lat]
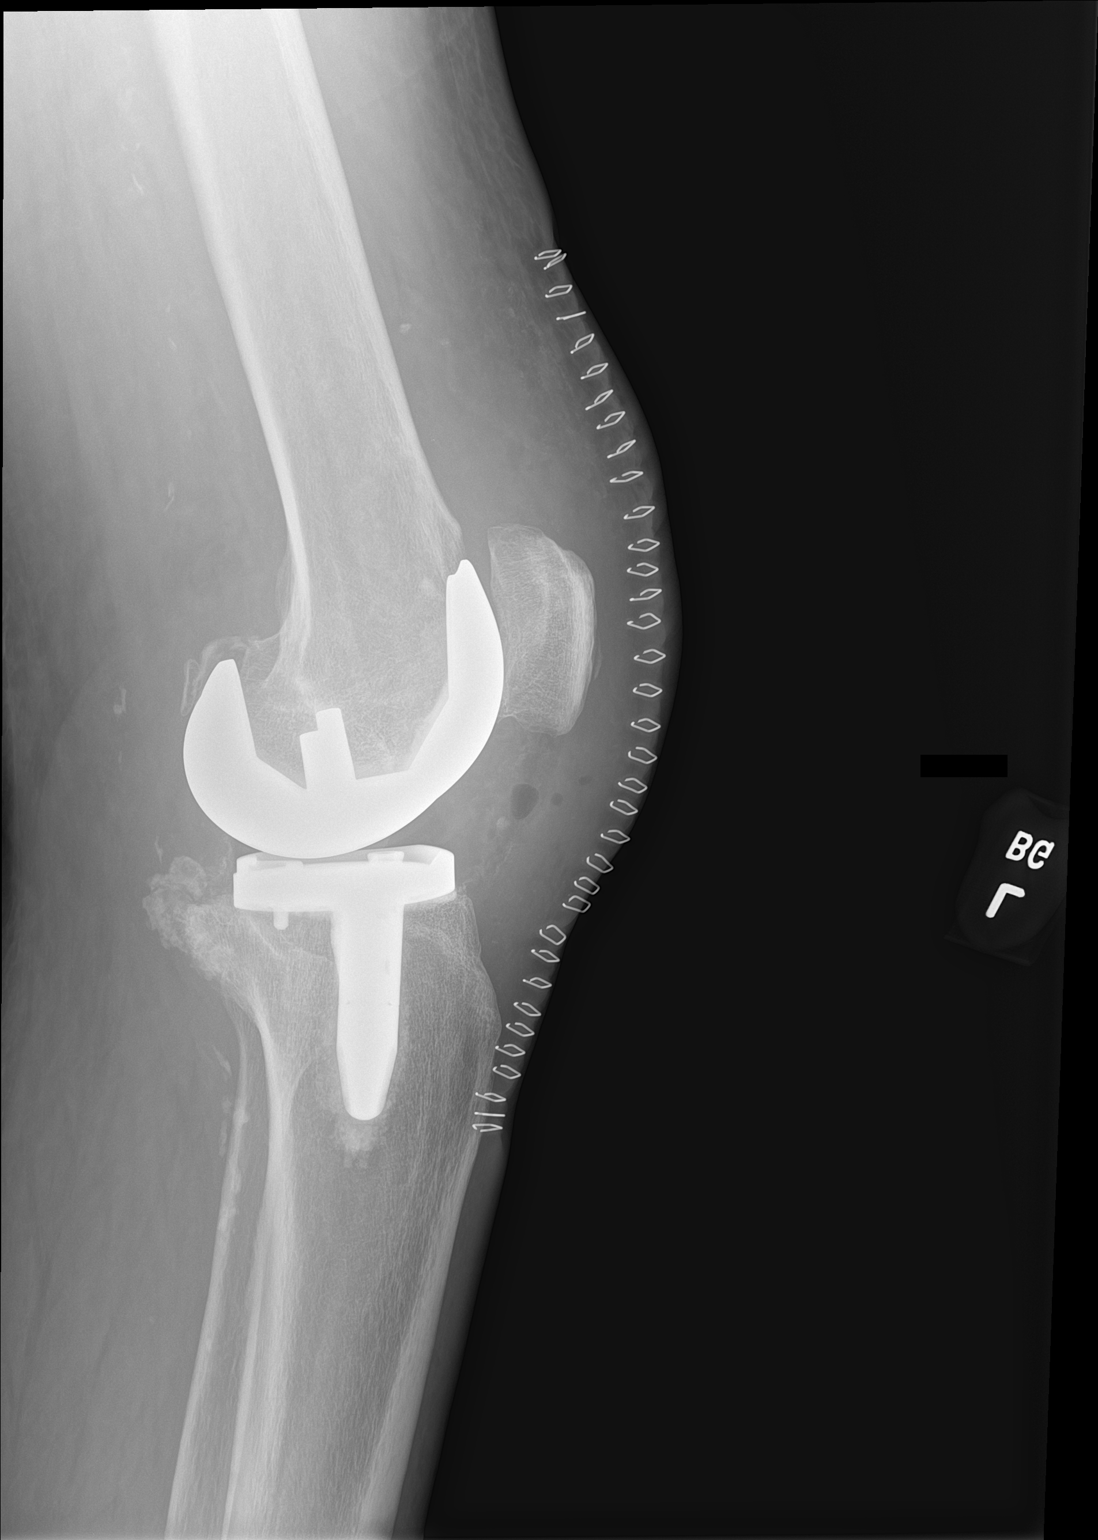

[2 of 2 positions shown; findings below may reference images not displayed]

FINDINGS: Left knee arthroplasty in expected alignment. No periprosthetic
lucency or fracture. There has been patellar resurfacing. No erosion
or bony destruction. Generalized soft tissue edema, more focal
anteriorly. Foci of gas in the infrapatellar region not unexpected
post recent surgery. There is a knee joint effusion. Anterior skin
staples in place.
IMPRESSION: 1. Recent left knee arthroplasty in expected alignment. No hardware
complication.
2. Generalized soft tissue edema, more confluent anteriorly. Foci of
gas in the infrapatellar soft tissues not unexpected post recent
surgery.

## 2022-09-27 ENCOUNTER — Other Ambulatory Visit: Payer: Self-pay | Admitting: Cardiovascular Disease

## 2022-11-09 ENCOUNTER — Other Ambulatory Visit: Payer: Self-pay | Admitting: Cardiovascular Disease

## 2022-11-16 ENCOUNTER — Other Ambulatory Visit: Payer: Medicaid Other

## 2022-11-16 ENCOUNTER — Ambulatory Visit: Payer: Medicaid Other | Admitting: Cardiovascular Disease

## 2022-11-16 ENCOUNTER — Ambulatory Visit: Payer: Medicaid Other | Admitting: Cardiology

## 2022-11-16 ENCOUNTER — Encounter: Payer: Self-pay | Admitting: Cardiology

## 2022-11-16 VITALS — BP 130/82 | HR 56 | Ht 75.0 in | Wt 200.0 lb

## 2022-11-16 DIAGNOSIS — I10 Essential (primary) hypertension: Secondary | ICD-10-CM | POA: Diagnosis not present

## 2022-11-16 DIAGNOSIS — I2510 Atherosclerotic heart disease of native coronary artery without angina pectoris: Secondary | ICD-10-CM

## 2022-11-16 DIAGNOSIS — E7800 Pure hypercholesterolemia, unspecified: Secondary | ICD-10-CM | POA: Diagnosis not present

## 2022-11-16 DIAGNOSIS — E1169 Type 2 diabetes mellitus with other specified complication: Secondary | ICD-10-CM

## 2022-11-16 DIAGNOSIS — N401 Enlarged prostate with lower urinary tract symptoms: Secondary | ICD-10-CM

## 2022-11-16 NOTE — Assessment & Plan Note (Signed)
Monitored by the [place]. Recent numbers currently unavailable.

## 2022-11-16 NOTE — Assessment & Plan Note (Signed)
Patient feeling well, denies chest pain, shortness of breath.  CCTA 08/2020 Quality of study: Good  1-Calcium score: 3820.7  2-Right dominant system.  3-LIMA to LAD and SVG to OM are patent. SVG to PDA is occluded. Native RCA has distal high grade lesion with SVG to PDA occluded.

## 2022-11-16 NOTE — Progress Notes (Signed)
Cardiology Office Note   Date:  11/16/2022   ID:  [name], DOB May 10, 1960, MRN [id number]  PCP:  Sherron Monday, MD  Cardiologist:  Marisue Ivan, NP      History of Present Illness: [name] is a 62 y.o. male who presents for  Chief Complaint  Patient presents with   Follow-up    Patient in office for routine cardiac exam. Denies chest pain, shortness of breath, dizziness, lower extremity edema.       Past Medical History:  Diagnosis Date   Asthma    Cancer [facility])    prostate   Chronic back pain    Chronic neck pain    Cirrhosis of liver (HCC)    Complicated migraine 06/16/2011   Coronary artery disease    Depression    Diabetes mellitus without complication (HCC)    Gallstones    Headache(784.0)    Hepatitis C    Hypercholesteremia    Hypertension    Left leg paresthesias    Myocardial infarction (HCC)    Stroke (HCC)    TIA   Vertigo      Past Surgical History:  Procedure Laterality Date   BYPASS GRAFT     triple bypass surgery   CARDIAC SURGERY     COLONOSCOPY WITH PROPOFOL N/A 02/15/2017   Procedure: COLONOSCOPY WITH PROPOFOL;  Surgeon: Pasty Spillers, MD;  Location: [facility] SURGERY CNTR;  Service: Endoscopy;  Laterality: N/A;   COLONOSCOPY WITH PROPOFOL N/A 02/15/2022   Procedure: COLONOSCOPY WITH PROPOFOL;  Surgeon: Wyline Mood, MD;  Location: [facility] ENDOSCOPY;  Service: Gastroenterology;  Laterality: N/A;   COLONOSCOPY WITH PROPOFOL N/A 04/16/2022   Procedure: COLONOSCOPY WITH PROPOFOL;  Surgeon: Wyline Mood, MD;  Location: [facility] ENDOSCOPY;  Service: Gastroenterology;  Laterality: N/A;   CORONARY ANGIOPLASTY     CORONARY ARTERY BYPASS GRAFT     ESOPHAGOGASTRODUODENOSCOPY (EGD) WITH PROPOFOL N/A 02/15/2017   Procedure: ESOPHAGOGASTRODUODENOSCOPY (EGD) WITH PROPOFOL;  Surgeon: Pasty Spillers, MD;  Location: [facility] SURGERY CNTR;  Service: Endoscopy;  Laterality: N/A;   HAND SURGERY  1984   KNEE ARTHROPLASTY Left  05/04/2021   LEFT HEART CATH AND CORONARY ANGIOGRAPHY Left 04/29/2017   Procedure: LEFT HEART CATH AND CORONARY ANGIOGRAPHY;  Surgeon: Laurier Nancy, MD;  Location: [facility] INVASIVE CV LAB;  Service: Cardiovascular;  Laterality: Left;   LEFT HEART CATH AND CORONARY ANGIOGRAPHY Left 01/08/2019   Procedure: LEFT HEART CATH AND CORONARY ANGIOGRAPHY;  Surgeon: Laurier Nancy, MD;  Location: [facility] INVASIVE CV LAB;  Service: Cardiovascular;  Laterality: Left;   LEFT HEART CATH AND CORONARY ANGIOGRAPHY N/A 02/19/2021   Procedure: LEFT HEART CATH AND CORONARY ANGIOGRAPHY with intervention;  Surgeon: Laurier Nancy, MD;  Location: [facility] INVASIVE CV LAB;  Service: Cardiovascular;  Laterality: N/A;   ORTHOPEDIC SURGERY     "on my legs"     Current Outpatient Medications  Medication Sig Dispense Refill   acetaminophen (TYLENOL) 325 MG tablet Take 325 mg by mouth every 6 (six) hours as needed for moderate pain (pain score 4-6).     albuterol (VENTOLIN HFA) 108 (90 Base) MCG/ACT inhaler Inhale 2 puffs into the lungs every 6 (six) hours as needed for wheezing or shortness of breath. 8 g 1   amLODipine (NORVASC) 5 MG tablet Take 1 tablet by mouth once daily 90 tablet 0   aspirin EC 81 MG tablet Take 81 mg by mouth daily.     Calcium Carbonate-Vitamin D 600-400 MG-UNIT  tablet Take 1 tablet by mouth 2 (two) times daily.      clopidogrel (PLAVIX) 75 MG tablet Take 75 mg by mouth daily.     ezetimibe (ZETIA) 10 MG tablet Take 10 mg by mouth daily.     famotidine (PEPCID) 40 MG tablet Take 40 mg by mouth 2 (two) times daily.     finasteride (PROSCAR) 5 MG tablet Take 5 mg by mouth daily.     fluticasone (FLONASE) 50 MCG/ACT nasal spray Place 2 sprays into both nostrils daily as needed for allergies or rhinitis.     furosemide (LASIX) 20 MG tablet Take 1 tablet (20 mg total) by mouth daily as needed for fluid or edema. 30 tablet 0   Glycerin-Hypromellose-PEG 400 (DRY EYE RELIEF DROPS) 0.2-0.2-1 % SOLN Place 1 drop  into both eyes daily as needed (Dry eye).     insulin glargine (LANTUS) 100 UNIT/ML injection Inject 0.25 mLs (25 Units total) into the skin at bedtime. 10 mL 11   magnesium oxide (MAG-OX) 400 MG tablet Take 400 mg by mouth daily.     metoprolol succinate (TOPROL-XL) 25 MG 24 hr tablet Take 1 tablet by mouth once daily 90 tablet 0   pantoprazole (PROTONIX) 40 MG tablet Take 40 mg by mouth daily.     ranolazine (RANEXA) 1000 MG SR tablet Take 1 tablet by mouth twice daily 180 tablet 0   rosuvastatin (CRESTOR) 20 MG tablet Take 20 mg by mouth daily.      tamsulosin (FLOMAX) 0.4 MG CAPS capsule Take 1 capsule (0.4 mg total) by mouth daily after breakfast. 30 capsule    venlafaxine XR (EFFEXOR-XR) 75 MG 24 hr capsule Take 225 mg by mouth daily with breakfast.     No current facility-administered medications for this visit.    Allergies:   Bee venom and Questran [cholestyramine]    Social History:   reports that he quit smoking about 13 years ago. His smoking use included cigarettes. He started smoking about 48 years ago. He has a 35 pack-year smoking history. He has never used smokeless tobacco. He reports that he does not drink alcohol and does not use drugs.   Family History:  family history includes Diabetes in his brother, father, mother, and sister; Hypertension in his brother, father, mother, and sister.    ROS:     Review of Systems  Constitutional: Negative.   HENT: Negative.    Eyes: Negative.   Respiratory: Negative.    Cardiovascular: Negative.   Gastrointestinal: Negative.   Genitourinary: Negative.   Musculoskeletal: Negative.   Skin: Negative.   Neurological: Negative.   Endo/Heme/Allergies: Negative.   Psychiatric/Behavioral: Negative.    All other systems reviewed and are negative.     All other systems are reviewed and negative.    PHYSICAL EXAM: VS:  BP 130/82   Pulse (!) 56   Ht 6\' 3"  (1.905 m)   Wt 200 lb (90.7 kg)   SpO2 94%   BMI 25.00 kg/m  , BMI  Body mass index is 25 kg/m. Last weight:  Wt Readings from Last 3 Encounters:  11/16/22 200 lb (90.7 kg)  08/16/22 194 lb 3.2 oz (88.1 kg)  05/17/22 199 lb 12.8 oz (90.6 kg)     Physical Exam Vitals and nursing note reviewed.  Constitutional:      Appearance: Normal appearance. He is normal weight.  HENT:     Head: Normocephalic and atraumatic.     Nose: Nose normal.  Mouth/Throat:     Mouth: Mucous membranes are moist.     Pharynx: Oropharynx is clear.  Eyes:     Extraocular Movements: Extraocular movements intact.     Conjunctiva/sclera: Conjunctivae normal.     Pupils: Pupils are equal, round, and reactive to light.  Cardiovascular:     Rate and Rhythm: Normal rate and regular rhythm.     Pulses: Normal pulses.     Heart sounds: Normal heart sounds.  Pulmonary:     Effort: Pulmonary effort is normal.     Breath sounds: Normal breath sounds.  Abdominal:     General: Abdomen is flat. Bowel sounds are normal.     Palpations: Abdomen is soft.  Musculoskeletal:        General: Normal range of motion.     Cervical back: Normal range of motion.  Skin:    General: Skin is warm and dry.  Neurological:     General: No focal deficit present.     Mental Status: He is alert and oriented to person, place, and time. Mental status is at baseline.  Psychiatric:        Mood and Affect: Mood normal.        Behavior: Behavior normal.        Thought Content: Thought content normal.        Judgment: Judgment normal.       EKG:   Recent Labs: No results found for requested labs within last 365 days.    Lipid Panel    Component Value Date/Time   CHOL 184 06/14/2011 0443   TRIG 123 04/02/2019 2304   HDL 27 (L) 06/14/2011 0443   CHOLHDL 6.8 06/14/2011 0443   VLDL 39 06/14/2011 0443   LDLCALC 118 (H) 06/14/2011 0443      Other studies Reviewed: Additional studies/ records that were reviewed today include:  Review of the above records demonstrates:       No data to  display            ASSESSMENT AND PLAN:    ICD-10-CM   1. Coronary artery disease involving native coronary artery of native heart without angina pectoris  I25.10     2. Primary hypertension  I10     3. Hypercholesteremia  E78.00        Problem List Items Addressed This Visit       Cardiovascular and Mediastinum   CAD (coronary artery disease) - Primary (Chronic)    Patient feeling well, denies chest pain, shortness of breath.  CCTA 08/2020 Quality of study: Good  1-Calcium score: 3820.7  2-Right dominant system.  3-LIMA to LAD and SVG to OM are patent. SVG to PDA is occluded. Native RCA has distal high grade lesion with SVG to PDA occluded.       Hypertension    Well controlled. Continue current medications.         Other   Hypercholesteremia    [facility] currently unavailable.         Disposition:   Return in about 6 months (around 05/17/2023) for cardiology.    Total time spent: 25 minutes  Signed,  Google, NP  11/16/2022 10:07 AM    [facility]

## 2022-11-16 NOTE — Assessment & Plan Note (Signed)
Well controlled. Continue current medications  

## 2022-11-17 LAB — HEMOGLOBIN A1C
Est. average glucose Bld gHb Est-mCnc: 105 mg/dL
Hgb A1c MFr Bld: 5.3 % (ref 4.8–5.6)

## 2022-11-17 LAB — COMPREHENSIVE METABOLIC PANEL
ALT: 11 [IU]/L (ref 0–44)
AST: 15 [IU]/L (ref 0–40)
Albumin: 4.2 g/dL (ref 3.9–4.9)
Alkaline Phosphatase: 62 [IU]/L (ref 44–121)
BUN/Creatinine Ratio: 11 (ref 10–24)
BUN: 15 mg/dL (ref 8–27)
Bilirubin Total: 0.6 mg/dL (ref 0.0–1.2)
CO2: 21 mmol/L (ref 20–29)
Calcium: 9.1 mg/dL (ref 8.6–10.2)
Chloride: 105 mmol/L (ref 96–106)
Creatinine, Ser: 1.41 mg/dL — ABNORMAL HIGH (ref 0.76–1.27)
Globulin, Total: 2.1 g/dL (ref 1.5–4.5)
Glucose: 114 mg/dL — ABNORMAL HIGH (ref 70–99)
Potassium: 4 mmol/L (ref 3.5–5.2)
Sodium: 140 mmol/L (ref 134–144)
Total Protein: 6.3 g/dL (ref 6.0–8.5)
eGFR: 56 mL/min/{1.73_m2} — ABNORMAL LOW (ref >59)

## 2022-11-17 LAB — CBC WITH DIFF/PLATELET
Basophils Absolute: 0 10*3/uL (ref 0.0–0.2)
Basos: 0 %
EOS (ABSOLUTE): 0.5 10*3/uL — ABNORMAL HIGH (ref 0.0–0.4)
Eos: 8 %
Hematocrit: 40.7 % (ref 37.5–51.0)
Hemoglobin: 13.4 g/dL (ref 13.0–17.7)
Immature Grans (Abs): 0.1 10*3/uL (ref 0.0–0.1)
Immature Granulocytes: 1 %
Lymphocytes Absolute: 1.9 10*3/uL (ref 0.7–3.1)
Lymphs: 32 %
MCH: 29.3 pg (ref 26.6–33.0)
MCHC: 32.9 g/dL (ref 31.5–35.7)
MCV: 89 fL (ref 79–97)
Monocytes Absolute: 0.5 10*3/uL (ref 0.1–0.9)
Monocytes: 8 %
Neutrophils Absolute: 3.1 10*3/uL (ref 1.4–7.0)
Neutrophils: 51 %
Platelets: 173 10*3/uL (ref 150–450)
RBC: 4.58 x10E6/uL (ref 4.14–5.80)
RDW: 12.9 % (ref 11.6–15.4)
WBC: 6 10*3/uL (ref 3.4–10.8)

## 2022-11-17 LAB — LIPID PANEL
Chol/HDL Ratio: 2.2 ratio (ref 0.0–5.0)
Cholesterol, Total: 154 mg/dL (ref 100–199)
HDL: 70 mg/dL (ref >39)
LDL Chol Calc (NIH): 63 mg/dL (ref 0–99)
Triglycerides: 119 mg/dL (ref 0–149)
VLDL Cholesterol Cal: 21 mg/dL (ref 5–40)

## 2022-11-17 LAB — PSA: Prostate Specific Ag, Serum: 0.2 ng/mL (ref 0.0–4.0)

## 2022-11-19 ENCOUNTER — Encounter: Payer: Self-pay | Admitting: Internal Medicine

## 2022-11-19 ENCOUNTER — Ambulatory Visit (INDEPENDENT_AMBULATORY_CARE_PROVIDER_SITE_OTHER): Payer: Medicaid Other | Admitting: Internal Medicine

## 2022-11-19 VITALS — BP 142/90 | HR 57 | Ht 75.0 in | Wt 197.6 lb

## 2022-11-19 DIAGNOSIS — Z1331 Encounter for screening for depression: Secondary | ICD-10-CM | POA: Diagnosis not present

## 2022-11-19 DIAGNOSIS — E7800 Pure hypercholesterolemia, unspecified: Secondary | ICD-10-CM

## 2022-11-19 DIAGNOSIS — Z23 Encounter for immunization: Secondary | ICD-10-CM | POA: Diagnosis not present

## 2022-11-19 DIAGNOSIS — Z0001 Encounter for general adult medical examination with abnormal findings: Secondary | ICD-10-CM | POA: Diagnosis not present

## 2022-11-19 DIAGNOSIS — R351 Nocturia: Secondary | ICD-10-CM

## 2022-11-19 DIAGNOSIS — I10 Essential (primary) hypertension: Secondary | ICD-10-CM

## 2022-11-19 DIAGNOSIS — N1831 Chronic kidney disease, stage 3a: Secondary | ICD-10-CM | POA: Diagnosis not present

## 2022-11-19 DIAGNOSIS — N401 Enlarged prostate with lower urinary tract symptoms: Secondary | ICD-10-CM

## 2022-11-19 DIAGNOSIS — E785 Hyperlipidemia, unspecified: Secondary | ICD-10-CM

## 2022-11-19 DIAGNOSIS — E1169 Type 2 diabetes mellitus with other specified complication: Secondary | ICD-10-CM | POA: Diagnosis not present

## 2022-11-19 LAB — POCT URINALYSIS DIPSTICK
Blood, UA: NEGATIVE
Glucose, UA: NEGATIVE
Ketones, UA: NEGATIVE
Leukocytes, UA: NEGATIVE
Nitrite, UA: NEGATIVE
Protein, UA: POSITIVE — AB
Spec Grav, UA: >=1.03 — AB (ref 1.010–1.025)
Urobilinogen, UA: NEGATIVE U/dL — AB
pH, UA: 6 (ref 5.0–8.0)

## 2022-11-19 LAB — POC CREATINE & ALBUMIN,URINE
Creatinine, POC: 300 mg/dL
Microalbumin Ur, POC: 150 mg/L

## 2022-11-19 MED ORDER — KERENDIA 10 MG PO TABS: 10.0000 mg | ORAL_TABLET | Freq: Every day | ORAL | 0 refills | Status: AC

## 2022-11-19 NOTE — Progress Notes (Signed)
Established Patient Office Visit  Subjective:  Patient ID: [name], male    DOB: 1960-06-20  Age: 62 y.o. MRN: [id number]  Chief Complaint  Patient presents with   Annual Exam    No new complaints, here for CPE, lab review and medication refills. Bp a little high today. Labs reviewed and notable for well controlled diabetes, A1c at target, lipids at target cmp notable for CKD3a. Denies any hypoglycemic episodes and home bg readings have been at target.    No other concerns at this time.   Past Medical History:  Diagnosis Date   Asthma    Cancer [facility])    prostate   Chronic back pain    Chronic neck pain    Cirrhosis of liver (HCC)    Complicated migraine 06/16/2011   Coronary artery disease    Depression    Diabetes mellitus without complication (HCC)    Gallstones    Headache(784.0)    Hepatitis C    Hypercholesteremia    Hypertension    Left leg paresthesias    Myocardial infarction (HCC)    Stroke (HCC)    TIA   Vertigo     Past Surgical History:  Procedure Laterality Date   BYPASS GRAFT     triple bypass surgery   CARDIAC SURGERY     COLONOSCOPY WITH PROPOFOL N/A 02/15/2017   Procedure: COLONOSCOPY WITH PROPOFOL;  Surgeon: Pasty Spillers, MD;  Location: [facility];  Service: Endoscopy;  Laterality: N/A;   COLONOSCOPY WITH PROPOFOL N/A 02/15/2022   Procedure: COLONOSCOPY WITH PROPOFOL;  Surgeon: Wyline Mood, MD;  Location: [facility] ENDOSCOPY;  Service: Gastroenterology;  Laterality: N/A;   COLONOSCOPY WITH PROPOFOL N/A 04/16/2022   Procedure: COLONOSCOPY WITH PROPOFOL;  Surgeon: Wyline Mood, MD;  Location: [facility] ENDOSCOPY;  Service: Gastroenterology;  Laterality: N/A;   CORONARY ANGIOPLASTY     CORONARY ARTERY BYPASS GRAFT     ESOPHAGOGASTRODUODENOSCOPY (EGD) WITH PROPOFOL N/A 02/15/2017   Procedure: ESOPHAGOGASTRODUODENOSCOPY (EGD) WITH PROPOFOL;  Surgeon: Pasty Spillers, MD;  Location: [facility];  Service: Endoscopy;   Laterality: N/A;   HAND SURGERY  1984   KNEE ARTHROPLASTY Left 05/04/2021   LEFT HEART CATH AND CORONARY ANGIOGRAPHY Left 04/29/2017   Procedure: LEFT HEART CATH AND CORONARY ANGIOGRAPHY;  Surgeon: Laurier Nancy, MD;  Location: [facility] INVASIVE CV LAB;  Service: Cardiovascular;  Laterality: Left;   LEFT HEART CATH AND CORONARY ANGIOGRAPHY Left 01/08/2019   Procedure: LEFT HEART CATH AND CORONARY ANGIOGRAPHY;  Surgeon: Laurier Nancy, MD;  Location: [facility] INVASIVE CV LAB;  Service: Cardiovascular;  Laterality: Left;   LEFT HEART CATH AND CORONARY ANGIOGRAPHY N/A 02/19/2021   Procedure: LEFT HEART CATH AND CORONARY ANGIOGRAPHY with intervention;  Surgeon: Laurier Nancy, MD;  Location: [facility] INVASIVE CV LAB;  Service: Cardiovascular;  Laterality: N/A;   ORTHOPEDIC SURGERY     "on my legs"    Social History   Socioeconomic History   Marital status: Legally Separated    Spouse name: Not on file   Number of children: Not on file   Years of education: Not on file   Highest education level: Not on file  Occupational History   Not on file  Tobacco Use   Smoking status: Former    Current packs/day: 0.00    Average packs/day: 1 pack/day for 35.0 years (35.0 ttl pk-yrs)    Types: Cigarettes    Start date: 01/25/1974    Quit date: 01/25/2009    Years  since quitting: 13.8   Smokeless tobacco: Never  Vaping Use   Vaping status: Never Used  Substance and Sexual Activity   Alcohol use: No    Comment: former alcoholic   Drug use: No    Comment: former cocaine abuse   Sexual activity: Not Currently  Other Topics Concern   Not on file  Social History Narrative   Not on file   Social Determinants of Health   Financial Resource Strain: Not on file  Food Insecurity: Not on file  Transportation Needs: Not on file  Physical Activity: Not on file  Stress: Not on file  Social Connections: Not on file  Intimate Partner Violence: Not on file    Family History  Problem Relation Age of Onset    Diabetes Brother    Diabetes Sister    Diabetes Father    Hypertension Father    Diabetes Mother    Hypertension Mother    Hypertension Sister    Hypertension Brother     Allergies  Allergen Reactions   Bee Venom Anaphylaxis and Swelling   Questran [Cholestyramine] Other (See Comments)    Reaction unknown    Review of Systems  Constitutional: Negative.   HENT: Negative.    Eyes: Negative.   Respiratory: Negative.    Cardiovascular: Negative.   Gastrointestinal: Negative.   Genitourinary:  Positive for frequency.  Skin: Negative.   Neurological: Negative.   Endo/Heme/Allergies: Negative.        Objective:   BP (!) 142/90   Pulse (!) 57   Ht 6\' 3"  (1.905 m)   Wt 197 lb 9.6 oz (89.6 kg)   SpO2 97%   BMI 24.70 kg/m   Vitals:   11/19/22 0945  BP: (!) 142/90  Pulse: (!) 57  Height: 6\' 3"  (1.905 m)  Weight: 197 lb 9.6 oz (89.6 kg)  SpO2: 97%  BMI (Calculated): 24.7    Physical Exam Vitals reviewed.  Constitutional:      Appearance: Normal appearance.  HENT:     Head: Normocephalic.     Left Ear: There is no impacted cerumen.     Nose: Nose normal.     Mouth/Throat:     Mouth: Mucous membranes are moist.     Pharynx: No posterior oropharyngeal erythema.  Eyes:     Extraocular Movements: Extraocular movements intact.     Pupils: Pupils are equal, round, and reactive to light.  Cardiovascular:     Rate and Rhythm: Regular rhythm.     Chest Wall: PMI is not displaced.     Pulses: Normal pulses.     Heart sounds: S1 normal and S2 normal. Murmur heard.     Crescendo decrescendo systolic murmur is present with a grade of 3/6.  Pulmonary:     Effort: Pulmonary effort is normal.     Breath sounds: Normal air entry. No rhonchi or rales.  Abdominal:     General: Abdomen is flat. Bowel sounds are normal. There is no distension.     Palpations: Abdomen is soft. There is no hepatomegaly, splenomegaly or mass.     Tenderness: There is no abdominal tenderness.   Musculoskeletal:        General: Normal range of motion.     Cervical back: Normal range of motion and neck supple.     Right lower leg: No edema.     Left lower leg: No edema.  Skin:    General: Skin is warm and dry.  Neurological:  General: No focal deficit present.     Mental Status: He is alert and oriented to person, place, and time.     Cranial Nerves: No cranial nerve deficit.     Motor: No weakness.  Psychiatric:        Mood and Affect: Mood normal.        Behavior: Behavior normal.      Results for orders placed or performed in visit on 11/19/22  POC CREATINE & ALBUMIN,URINE  Result Value Ref Range   Microalbumin Ur, POC 150 mg/L   Creatinine, POC 300 mg/dL   Albumin/Creatinine Ratio, Urine, POC 30-300   POCT Urinalysis Dipstick (81002)  Result Value Ref Range   Color, UA Yellow    Clarity, UA Clear    Glucose, UA Negative Negative   Bilirubin, UA 1+    Ketones, UA Negative    Spec Grav, UA >=1.030 (A) 1.010 - 1.025   Blood, UA Negative    pH, UA 6.0 5.0 - 8.0   Protein, UA Positive (A) Negative   Urobilinogen, UA negative (A) 0.2 or 1.0 E.U./dL   Nitrite, UA Negative    Leukocytes, UA Negative Negative   Appearance Yellow    Odor NO     Recent Results (from the past 2160 hour(Capri Raben))  PSA     Status: None   Collection Time: 11/16/22 10:14 AM  Result Value Ref Range   Prostate Specific Ag, Serum 0.2 0.0 - 4.0 ng/mL    Comment: Roche ECLIA methodology. According to the American Urological Association, Serum PSA should decrease and remain at undetectable levels after radical prostatectomy. The AUA defines biochemical recurrence as an initial PSA value 0.2 ng/mL or greater followed by a subsequent confirmatory PSA value 0.2 ng/mL or greater. Values obtained with different assay methods or kits cannot be used interchangeably. Results cannot be interpreted as absolute evidence of the presence or absence of malignant disease.   Lipid panel     Status:  None   Collection Time: 11/16/22 10:14 AM  Result Value Ref Range   Cholesterol, Total 154 100 - 199 mg/dL   Triglycerides 782 0 - 149 mg/dL   HDL 70 >95 mg/dL   VLDL Cholesterol Cal 21 5 - 40 mg/dL   LDL Chol Calc (NIH) 63 0 - 99 mg/dL   Chol/HDL Ratio 2.2 0.0 - 5.0 ratio    Comment:                                   T. Chol/HDL Ratio                                             Men  Women                               1/2 Avg.Risk  3.4    3.3                                   Avg.Risk  5.0    4.4  2X Avg.Risk  9.6    7.1                                3X Avg.Risk 23.4   11.0   Hemoglobin A1c     Status: None   Collection Time: 11/16/22 10:14 AM  Result Value Ref Range   Hgb A1c MFr Bld 5.3 4.8 - 5.6 %    Comment:          Prediabetes: 5.7 - 6.4          Diabetes: >6.4          Glycemic control for adults with diabetes: <7.0    Est. average glucose Bld gHb Est-mCnc 105 mg/dL  Comprehensive metabolic panel     Status: Abnormal   Collection Time: 11/16/22 10:14 AM  Result Value Ref Range   Glucose 114 (H) 70 - 99 mg/dL   BUN 15 8 - 27 mg/dL   Creatinine, Ser 1.91 (H) 0.76 - 1.27 mg/dL   eGFR 56 (L) >47 WG/NFA/2.13   BUN/Creatinine Ratio 11 10 - 24   Sodium 140 134 - 144 mmol/L   Potassium 4.0 3.5 - 5.2 mmol/L   Chloride 105 96 - 106 mmol/L   CO2 21 20 - 29 mmol/L   Calcium 9.1 8.6 - 10.2 mg/dL   Total Protein 6.3 6.0 - 8.5 g/dL   Albumin 4.2 3.9 - 4.9 g/dL   Globulin, Total 2.1 1.5 - 4.5 g/dL   Bilirubin Total 0.6 0.0 - 1.2 mg/dL   Alkaline Phosphatase 62 44 - 121 IU/L   AST 15 0 - 40 IU/L   ALT 11 0 - 44 IU/L  CBC With Diff/Platelet     Status: Abnormal   Collection Time: 11/16/22 10:14 AM  Result Value Ref Range   WBC 6.0 3.4 - 10.8 x10E3/uL   RBC 4.58 4.14 - 5.80 x10E6/uL   Hemoglobin 13.4 13.0 - 17.7 g/dL   Hematocrit 08.6 57.8 - 51.0 %   MCV 89 79 - 97 fL   MCH 29.3 26.6 - 33.0 pg   MCHC 32.9 31.5 - 35.7 g/dL   RDW 46.9 62.9 -  52.8 %   Platelets 173 150 - 450 x10E3/uL   Neutrophils 51 Not Estab. %   Lymphs 32 Not Estab. %   Monocytes 8 Not Estab. %   Eos 8 Not Estab. %   Basos 0 Not Estab. %   Neutrophils Absolute 3.1 1.4 - 7.0 x10E3/uL   Lymphocytes Absolute 1.9 0.7 - 3.1 x10E3/uL   Monocytes Absolute 0.5 0.1 - 0.9 x10E3/uL   EOS (ABSOLUTE) 0.5 (H) 0.0 - 0.4 x10E3/uL   Basophils Absolute 0.0 0.0 - 0.2 x10E3/uL   Immature Granulocytes 1 Not Estab. %   Immature Grans (Abs) 0.1 0.0 - 0.1 x10E3/uL  POC CREATINE & ALBUMIN,URINE     Status: None   Collection Time: 11/19/22 10:54 AM  Result Value Ref Range   Microalbumin Ur, POC 150 mg/L   Creatinine, POC 300 mg/dL   Albumin/Creatinine Ratio, Urine, POC 30-300   POCT Urinalysis Dipstick (41324)     Status: Abnormal   Collection Time: 11/19/22 10:55 AM  Result Value Ref Range   Color, UA Yellow    Clarity, UA Clear    Glucose, UA Negative Negative   Bilirubin, UA 1+    Ketones, UA Negative    Spec Grav, UA >=1.030 (A) 1.010 - 1.025  Blood, UA Negative    pH, UA 6.0 5.0 - 8.0   Protein, UA Positive (A) Negative   Urobilinogen, UA negative (A) 0.2 or 1.0 E.U./dL   Nitrite, UA Negative    Leukocytes, UA Negative Negative   Appearance Yellow    Odor NO       Assessment & Plan:  As per problem list. Problem List Items Addressed This Visit       Cardiovascular and Mediastinum   Hypertension     Endocrine   Type 2 diabetes mellitus with hyperlipidemia (HCC)   Relevant Orders   Hemoglobin A1c     Genitourinary   CKD stage 3a, GFR 45-59 ml/min (HCC)   Relevant Medications   Finerenone (KERENDIA) 10 MG TABS   Other Relevant Orders   POC CREATINE & ALBUMIN,URINE (Completed)   POCT Urinalysis Dipstick (81002) (Completed)   Comprehensive metabolic panel   UJW1+XBJYN Gap     Other   Hypercholesteremia - Primary   Relevant Orders   Comprehensive metabolic panel   Lipid panel   Benign prostatic hyperplasia with nocturia   Other Visit  Diagnoses     Needs flu shot       Relevant Orders   Influenza, MDCK, trivalent, PF(Flucelvax egg-free) (Completed)       Return in about 3 months (around 02/19/2023) for fu with labs prior.   Total time spent: 45 minutes  Luna Fuse, MD  11/19/2022   This document may have been prepared by [facility] Voice Recognition software and as such may include unintentional dictation errors.

## 2022-11-23 NOTE — Progress Notes (Signed)
Patient notified

## 2023-01-03 ENCOUNTER — Other Ambulatory Visit: Payer: Self-pay | Admitting: Cardiovascular Disease

## 2023-02-01 ENCOUNTER — Other Ambulatory Visit: Payer: Self-pay

## 2023-02-09 ENCOUNTER — Telehealth: Payer: Self-pay

## 2023-02-09 ENCOUNTER — Other Ambulatory Visit: Payer: Self-pay

## 2023-02-09 NOTE — Telephone Encounter (Signed)
 Needs insulin  refill from [place]. [employer] delivers it. Please advise. Refill request was sent 02/01/23. 0ut of insulin  for a week.

## 2023-02-12 ENCOUNTER — Other Ambulatory Visit: Payer: Self-pay | Admitting: Cardiovascular Disease

## 2023-02-18 ENCOUNTER — Ambulatory Visit: Payer: Medicaid Other | Admitting: Internal Medicine

## 2023-02-18 VITALS — BP 118/71 | HR 55 | Ht 75.0 in | Wt 197.9 lb

## 2023-02-18 DIAGNOSIS — E1169 Type 2 diabetes mellitus with other specified complication: Secondary | ICD-10-CM

## 2023-02-18 DIAGNOSIS — N1831 Chronic kidney disease, stage 3a: Secondary | ICD-10-CM | POA: Diagnosis not present

## 2023-02-18 DIAGNOSIS — E785 Hyperlipidemia, unspecified: Secondary | ICD-10-CM

## 2023-02-18 DIAGNOSIS — E7800 Pure hypercholesterolemia, unspecified: Secondary | ICD-10-CM | POA: Diagnosis not present

## 2023-02-18 DIAGNOSIS — Z0130 Encounter for examination of blood pressure without abnormal findings: Secondary | ICD-10-CM

## 2023-02-18 LAB — POCT CBG (FASTING - GLUCOSE)-MANUAL ENTRY: Glucose Fasting, POC: 92 mg/dL (ref 70–99)

## 2023-02-18 MED ORDER — INSULIN GLARGINE 100 UNIT/ML ~~LOC~~ SOLN: 70.0000 [IU] | Freq: Every day | SUBCUTANEOUS | 1 refills | Status: DC

## 2023-02-18 NOTE — Telephone Encounter (Signed)
Will inform patient  at visit today

## 2023-02-18 NOTE — Progress Notes (Signed)
Established Patient Office Visit  Subjective:  Patient ID: [name], male    DOB: 1960/12/07  Age: 63 y.o. MRN: [id number]  Chief Complaint  Patient presents with   Follow-up    3 month follow up     No new complaints, here for lab review and medication refills. Failed to have previsit labs done. Home bg readings have been between 90-130 in the am.      No other concerns at this time.   Past Medical History:  Diagnosis Date   Asthma    Cancer [facility])    prostate   Chronic back pain    Chronic neck pain    Cirrhosis of liver (HCC)    Complicated migraine 06/16/2011   Coronary artery disease    Depression    Diabetes mellitus without complication (HCC)    Gallstones    Headache(784.0)    Hepatitis C    Hypercholesteremia    Hypertension    Left leg paresthesias    Myocardial infarction (HCC)    Stroke (HCC)    TIA   Vertigo     Past Surgical History:  Procedure Laterality Date   BYPASS GRAFT     triple bypass surgery   CARDIAC SURGERY     COLONOSCOPY WITH PROPOFOL N/A 02/15/2017   Procedure: COLONOSCOPY WITH PROPOFOL;  Surgeon: Pasty Spillers, MD;  Location: [facility];  Service: Endoscopy;  Laterality: N/A;   COLONOSCOPY WITH PROPOFOL N/A 02/15/2022   Procedure: COLONOSCOPY WITH PROPOFOL;  Surgeon: Wyline Mood, MD;  Location: [facility] ENDOSCOPY;  Service: Gastroenterology;  Laterality: N/A;   COLONOSCOPY WITH PROPOFOL N/A 04/16/2022   Procedure: COLONOSCOPY WITH PROPOFOL;  Surgeon: Wyline Mood, MD;  Location: [facility] ENDOSCOPY;  Service: Gastroenterology;  Laterality: N/A;   CORONARY ANGIOPLASTY     CORONARY ARTERY BYPASS GRAFT     ESOPHAGOGASTRODUODENOSCOPY (EGD) WITH PROPOFOL N/A 02/15/2017   Procedure: ESOPHAGOGASTRODUODENOSCOPY (EGD) WITH PROPOFOL;  Surgeon: Pasty Spillers, MD;  Location: [facility];  Service: Endoscopy;  Laterality: N/A;   HAND SURGERY  1984   KNEE ARTHROPLASTY Left 05/04/2021   LEFT HEART CATH AND  CORONARY ANGIOGRAPHY Left 04/29/2017   Procedure: LEFT HEART CATH AND CORONARY ANGIOGRAPHY;  Surgeon: Laurier Nancy, MD;  Location: [facility] INVASIVE CV LAB;  Service: Cardiovascular;  Laterality: Left;   LEFT HEART CATH AND CORONARY ANGIOGRAPHY Left 01/08/2019   Procedure: LEFT HEART CATH AND CORONARY ANGIOGRAPHY;  Surgeon: Laurier Nancy, MD;  Location: [facility] INVASIVE CV LAB;  Service: Cardiovascular;  Laterality: Left;   LEFT HEART CATH AND CORONARY ANGIOGRAPHY N/A 02/19/2021   Procedure: LEFT HEART CATH AND CORONARY ANGIOGRAPHY with intervention;  Surgeon: Laurier Nancy, MD;  Location: [facility] INV[facility] CV LAB;  Service: Cardiovascular;  Laterality: N/A;   ORTHOPEDIC SURGERY     "on my legs"    Social History   Socioeconomic History   Marital status: Legally Separated    Spouse name: Not on file   Number of children: Not on file   Years of education: Not on file   Highest education level: Not on file  Occupational History   Not on file  Tobacco Use   Smoking status: Former    Current packs/day: 0.00    Average packs/day: 1 pack/day for 35.0 years (35.0 ttl pk-yrs)    Types: Cigarettes    Start date: 01/25/1974    Quit date: 01/25/2009    Years since quitting: 14.0   Smokeless tobacco: Never  Vaping Use  Vaping status: Never Used  Substance and Sexual Activity   Alcohol use: No    Comment: former alcoholic   Drug use: No    Comment: former cocaine abuse   Sexual activity: Not Currently  Other Topics Concern   Not on file  Social History Narrative   Not on file   Social Drivers of Health   Financial Resource Strain: Not on file  Food Insecurity: Not on file  Transportation Needs: Not on file  Physical Activity: Not on file  Stress: Not on file  Social Connections: Not on file  Intimate Partner Violence: Not on file    Family History  Problem Relation Age of Onset   Diabetes Brother    Diabetes Sister    Diabetes Father    Hypertension Father    Diabetes Mother     Hypertension Mother    Hypertension Sister    Hypertension Brother     Allergies  Allergen Reactions   Bee Venom Anaphylaxis and Swelling   Questran [Cholestyramine] Other (See Comments)    Reaction unknown    Outpatient Medications Prior to Visit  Medication Sig   acetaminophen (TYLENOL) 325 MG tablet Take 325 mg by mouth every 6 (six) hours as needed for moderate pain (pain score 4-6).   albuterol (VENTOLIN HFA) 108 (90 Base) MCG/ACT inhaler Inhale 2 puffs into the lungs every 6 (six) hours as needed for wheezing or shortness of breath.   amLODipine (NORVASC) 5 MG tablet Take 1 tablet by mouth once daily   aspirin EC 81 MG tablet Take 81 mg by mouth daily.   Calcium Carbonate-Vitamin D 600-400 MG-UNIT tablet Take 1 tablet by mouth 2 (two) times daily.    clopidogrel (PLAVIX) 75 MG tablet Take 75 mg by mouth daily.   ezetimibe (ZETIA) 10 MG tablet Take 10 mg by mouth daily.   famotidine (PEPCID) 40 MG tablet Take 40 mg by mouth 2 (two) times daily.   finasteride (PROSCAR) 5 MG tablet Take 5 mg by mouth daily.   fluticasone (FLONASE) 50 MCG/ACT nasal spray Place 2 sprays into both nostrils daily as needed for allergies or rhinitis.   Glycerin-Hypromellose-PEG 400 (DRY EYE RELIEF DROPS) 0.2-0.2-1 % SOLN Place 1 drop into both eyes daily as needed (Dry eye).   insulin glargine (LANTUS) 100 UNIT/ML injection Inject 0.25 mLs (25 Units total) into the skin at bedtime.   magnesium oxide (MAG-OX) 400 MG tablet Take 400 mg by mouth daily.   metoprolol succinate (TOPROL-XL) 25 MG 24 hr tablet Take 1 tablet by mouth once daily   pantoprazole (PROTONIX) 40 MG tablet Take 40 mg by mouth daily.   ranolazine (RANEXA) 1000 MG SR tablet Take 1 tablet by mouth twice daily   rosuvastatin (CRESTOR) 20 MG tablet Take 20 mg by mouth daily.    tamsulosin (FLOMAX) 0.4 MG CAPS capsule Take 1 capsule (0.4 mg total) by mouth daily after breakfast.   venlafaxine XR (EFFEXOR-XR) 75 MG 24 hr capsule Take 225  mg by mouth daily with breakfast.   furosemide (LASIX) 20 MG tablet Take 1 tablet (20 mg total) by mouth daily as needed for fluid or edema.   No facility-administered medications prior to visit.    Review of Systems  Constitutional:  Positive for weight loss (3 lbs).  HENT: Negative.    Eyes: Negative.   Respiratory: Negative.    Cardiovascular: Negative.   Gastrointestinal: Negative.   Genitourinary:  Positive for frequency.  Skin: Negative.   Neurological: Negative.  Endo/Heme/Allergies: Negative.        Objective:   BP 118/71   Pulse (!) 55   Ht 6\' 3"  (1.905 m)   Wt 197 lb 14.4 oz (89.8 kg)   SpO2 98%   BMI 24.74 kg/m   Vitals:   02/18/23 1049  BP: 118/71  Pulse: (!) 55  Height: 6\' 3"  (1.905 m)  Weight: 197 lb 14.4 oz (89.8 kg)  SpO2: 98%  BMI (Calculated): 24.74    Physical Exam Vitals reviewed.  Constitutional:      Appearance: Normal appearance.  HENT:     Head: Normocephalic.     Left Ear: There is no impacted cerumen.     Nose: Nose normal.     Mouth/Throat:     Mouth: Mucous membranes are moist.     Pharynx: No posterior oropharyngeal erythema.  Eyes:     Extraocular Movements: Extraocular movements intact.     Pupils: Pupils are equal, round, and reactive to light.  Cardiovascular:     Rate and Rhythm: Regular rhythm.     Chest Wall: PMI is not displaced.     Pulses: Normal pulses.     Heart sounds: S1 normal and S2 normal. Murmur heard.     Crescendo decrescendo systolic murmur is present with a grade of 3/6.  Pulmonary:     Effort: Pulmonary effort is normal.     Breath sounds: Normal air entry. No rhonchi or rales.  Abdominal:     General: Abdomen is flat. Bowel sounds are normal. There is no distension.     Palpations: Abdomen is soft. There is no hepatomegaly, splenomegaly or mass.     Tenderness: There is no abdominal tenderness.  Musculoskeletal:        General: Normal range of motion.     Cervical back: Normal range of motion  and neck supple.     Right lower leg: No edema.     Left lower leg: No edema.  Skin:    General: Skin is warm and dry.  Neurological:     General: No focal deficit present.     Mental Status: He is alert and oriented to person, place, and time.     Cranial Nerves: No cranial nerve deficit.     Motor: No weakness.  Psychiatric:        Mood and Affect: Mood normal.        Behavior: Behavior normal.      Results for orders placed or performed in visit on 02/18/23  POCT CBG (Fasting - Glucose)  Result Value Ref Range   Glucose Fasting, POC 92 70 - 99 mg/dL    Recent Results (from the past 2160 hours)  POCT CBG (Fasting - Glucose)     Status: None   Collection Time: 02/18/23 10:53 AM  Result Value Ref Range   Glucose Fasting, POC 92 70 - 99 mg/dL      Assessment & Plan:  As per problem list  Problem List Items Addressed This Visit       Endocrine   Type 2 diabetes mellitus with hyperlipidemia (HCC) - Primary   Relevant Orders   POCT CBG (Fasting - Glucose) (Completed)     Genitourinary   CKD stage 3a, GFR 45-59 ml/min (HCC)     Other   Hypercholesteremia    Return in about 2 weeks (around 03/04/2023).   Total time spent: 20 minutes  Luna Fuse, MD  02/18/2023   This document may have been  prepared by [employer] and as such may include unintentional dictation errors.

## 2023-02-19 LAB — COMPREHENSIVE METABOLIC PANEL
ALT: 10 [IU]/L (ref 0–44)
AST: 14 [IU]/L (ref 0–40)
Albumin: 4.2 g/dL (ref 3.9–4.9)
Alkaline Phosphatase: 51 [IU]/L (ref 44–121)
BUN/Creatinine Ratio: 11 (ref 10–24)
BUN: 16 mg/dL (ref 8–27)
Bilirubin Total: 0.7 mg/dL (ref 0.0–1.2)
CO2: 21 mmol/L (ref 20–29)
Calcium: 9.1 mg/dL (ref 8.6–10.2)
Chloride: 103 mmol/L (ref 96–106)
Creatinine, Ser: 1.5 mg/dL — ABNORMAL HIGH (ref 0.76–1.27)
Globulin, Total: 2.2 g/dL (ref 1.5–4.5)
Glucose: 80 mg/dL (ref 70–99)
Potassium: 4 mmol/L (ref 3.5–5.2)
Sodium: 141 mmol/L (ref 134–144)
Total Protein: 6.4 g/dL (ref 6.0–8.5)
eGFR: 52 mL/min/{1.73_m2} — ABNORMAL LOW (ref >59)

## 2023-02-19 LAB — LIPID PANEL
Chol/HDL Ratio: 2.3 {ratio} (ref 0.0–5.0)
Cholesterol, Total: 156 mg/dL (ref 100–199)
HDL: 69 mg/dL (ref >39)
LDL Chol Calc (NIH): 68 mg/dL (ref 0–99)
Triglycerides: 104 mg/dL (ref 0–149)
VLDL Cholesterol Cal: 19 mg/dL (ref 5–40)

## 2023-02-19 LAB — HEMOGLOBIN A1C
Est. average glucose Bld gHb Est-mCnc: 111 mg/dL
Hgb A1c MFr Bld: 5.5 % (ref 4.8–5.6)

## 2023-03-04 ENCOUNTER — Ambulatory Visit: Payer: Medicaid Other | Admitting: Internal Medicine

## 2023-03-04 VITALS — BP 133/78 | HR 60 | Temp 96.8°F | Ht 75.0 in | Wt 199.8 lb

## 2023-03-04 DIAGNOSIS — I10 Essential (primary) hypertension: Secondary | ICD-10-CM | POA: Diagnosis not present

## 2023-03-04 DIAGNOSIS — E785 Hyperlipidemia, unspecified: Secondary | ICD-10-CM | POA: Diagnosis not present

## 2023-03-04 DIAGNOSIS — E7800 Pure hypercholesterolemia, unspecified: Secondary | ICD-10-CM

## 2023-03-04 DIAGNOSIS — N1831 Chronic kidney disease, stage 3a: Secondary | ICD-10-CM | POA: Diagnosis not present

## 2023-03-04 DIAGNOSIS — E1169 Type 2 diabetes mellitus with other specified complication: Secondary | ICD-10-CM | POA: Diagnosis not present

## 2023-03-04 LAB — POCT CBG (FASTING - GLUCOSE)-MANUAL ENTRY: Glucose Fasting, POC: 88 mg/dL (ref 70–99)

## 2023-03-04 MED ORDER — INSULIN GLARGINE 100 UNIT/ML ~~LOC~~ SOLN: 60.0000 [IU] | Freq: Every day | SUBCUTANEOUS | 2 refills | Status: DC

## 2023-03-04 NOTE — Progress Notes (Signed)
 Established Patient Office Visit  Subjective:  Patient ID: [name], male    DOB: [birth year]  Age: 63 y.o. MRN: [id number]  Chief Complaint  Patient presents with   Follow-up    No new complaints, here for lab review and medication refills. Labs reviewed and notable for well controlled diabetes, A1c at target, lipids at target with cmp notable for stable ckd 3a. Denies any hypoglycemic episodes and home bg readings have been at target.     No other concerns at this time.   Past Medical History:  Diagnosis Date   Asthma    Cancer [facility])    prostate   Chronic back pain    Chronic neck pain    Cirrhosis of liver (HCC)    Complicated migraine 06/16/2011   Coronary artery disease    Depression    Diabetes mellitus without complication (HCC)    Gallstones    Headache(784.0)    Hepatitis C    Hypercholesteremia    Hypertension    Left leg paresthesias    Myocardial infarction (HCC)    Stroke (HCC)    TIA   Vertigo     Past Surgical History:  Procedure Laterality Date   BYPASS GRAFT     triple bypass surgery   CARDIAC SURGERY     COLONOSCOPY WITH PROPOFOL  N/A 02/15/2017   Procedure: COLONOSCOPY WITH PROPOFOL ;  Surgeon: Janalyn Keene NOVAK, MD;  Location: [facility]ERY [facility];  Service: Endoscopy;  Laterality: N/A;   COLONOSCOPY WITH PROPOFOL  N/A 02/15/2022   Procedure: COLONOSCOPY WITH PROPOFOL ;  Surgeon: Therisa Bi, MD;  Location: [facility] ENDOSCOPY;  Service: Gastroenterology;  Laterality: N/A;   COLONOSCOPY WITH PROPOFOL  N/A 04/16/2022   Procedure: COLONOSCOPY WITH PROPOFOL ;  Surgeon: Therisa Bi, MD;  Location: [facility] ENDOSCOPY;  Service: Gastroenterology;  Laterality: N/A;   CORONARY ANGIOPLASTY     CORONARY ARTERY BYPASS GRAFT     ESOPHAGOGASTRODUODENOSCOPY (EGD) WITH PROPOFOL  N/A 02/15/2017   Procedure: ESOPHAGOGASTRODUODENOSCOPY (EGD) WITH PROPOFOL ;  Surgeon: Janalyn Keene NOVAK, MD;  Location: [facility]ERY [facility];  Service: Endoscopy;  Laterality: N/A;    HAND SURGERY  1984   KNEE ARTHROPLASTY Left 05/04/2021   LEFT HEART CATH AND CORONARY ANGIOGRAPHY Left 04/29/2017   Procedure: LEFT HEART CATH AND CORONARY ANGIOGRAPHY;  Surgeon: Fernand Denyse LABOR, MD;  Location: [facility] INVASIVE CV LAB;  Service: Cardiovascular;  Laterality: Left;   LEFT HEART CATH AND CORONARY ANGIOGRAPHY Left 01/08/2019   Procedure: LEFT HEART CATH AND CORONARY ANGIOGRAPHY;  Surgeon: Fernand Denyse LABOR, MD;  Location: [facility] INVASIVE CV LAB;  Service: Cardiovascular;  Laterality: Left;   LEFT HEART CATH AND CORONARY ANGIOGRAPHY N/A 02/19/2021   Procedure: LEFT HEART CATH AND CORONARY ANGIOGRAPHY with intervention;  Surgeon: Fernand Denyse LABOR, MD;  Location: [facility] INVASIVE CV LAB;  Service: Cardiovascular;  Laterality: N/A;   ORTHOPEDIC SURGERY     on my legs    Social History   Socioeconomic History   Marital status: Legally Separated    Spouse name: Not on file   Number of children: Not on file   Years of education: Not on file   Highest education level: Not on file  Occupational History   Not on file  Tobacco Use   Smoking status: Former    Current packs/day: 0.00    Average packs/day: 1 pack/day for 35.0 years (35.0 ttl pk-yrs)    Types: Cigarettes    Start date: 01/25/1974    Quit date: 01/25/2009    Years since quitting: 14.1  Smokeless tobacco: Never  Vaping Use   Vaping status: Never Used  Substance and Sexual Activity   Alcohol  use: No    Comment: former alcoholic   Drug use: No    Comment: former cocaine abuse   Sexual activity: Not Currently  Other Topics Concern   Not on file  Social History Narrative   Not on file   Social Drivers of Health   Financial Resource Strain: Not on file  Food Insecurity: Not on file  Transportation Needs: Not on file  Physical Activity: Not on file  Stress: Not on file  Social Connections: Not on file  Intimate Partner Violence: Not on file    Family History  Problem Relation Age of Onset   Diabetes Brother     Diabetes Sister    Diabetes Father    Hypertension Father    Diabetes Mother    Hypertension Mother    Hypertension Sister    Hypertension Brother     Allergies  Allergen Reactions   Bee Venom Anaphylaxis and Swelling   Questran [Cholestyramine] Other (See Comments)    Reaction unknown    Outpatient Medications Prior to Visit  Medication Sig   acetaminophen  (TYLENOL ) 325 MG tablet Take 325 mg by mouth every 6 (six) hours as needed for moderate pain (pain score 4-6).   albuterol  (VENTOLIN  HFA) 108 (90 Base) MCG/ACT inhaler Inhale 2 puffs into the lungs every 6 (six) hours as needed for wheezing or shortness of breath.   amLODipine  (NORVASC ) 5 MG tablet Take 1 tablet by mouth once daily   aspirin  EC 81 MG tablet Take 81 mg by mouth daily.   Calcium  Carbonate-Vitamin D 600-400 MG-UNIT tablet Take 1 tablet by mouth 2 (two) times daily.    clopidogrel (PLAVIX) 75 MG tablet Take 75 mg by mouth daily.   ezetimibe (ZETIA) 10 MG tablet Take 10 mg by mouth daily.   famotidine  (PEPCID ) 40 MG tablet Take 40 mg by mouth 2 (two) times daily.   finasteride  (PROSCAR ) 5 MG tablet Take 5 mg by mouth daily.   fluticasone  (FLONASE ) 50 MCG/ACT nasal spray Place 2 sprays into both nostrils daily as needed for allergies or rhinitis.   furosemide  (LASIX ) 20 MG tablet Take 1 tablet (20 mg total) by mouth daily as needed for fluid or edema.   Glycerin-Hypromellose-PEG 400 (DRY EYE RELIEF DROPS) 0.2-0.2-1 % SOLN Place 1 drop into both eyes daily as needed (Dry eye).   insulin  glargine (LANTUS ) 100 UNIT/ML injection Inject 0.7 mLs (70 Units total) into the skin at bedtime.   magnesium  oxide (MAG-OX) 400 MG tablet Take 400 mg by mouth daily.   metoprolol  succinate (TOPROL -XL) 25 MG 24 hr tablet Take 1 tablet by mouth once daily   pantoprazole  (PROTONIX ) 40 MG tablet Take 40 mg by mouth daily.   ranolazine  (RANEXA ) 1000 MG SR tablet Take 1 tablet by mouth twice daily   rosuvastatin  (CRESTOR ) 20 MG tablet Take  20 mg by mouth daily.    tamsulosin  (FLOMAX ) 0.4 MG CAPS capsule Take 1 capsule (0.4 mg total) by mouth daily after breakfast.   venlafaxine  XR (EFFEXOR -XR) 75 MG 24 hr capsule Take 225 mg by mouth daily with breakfast.   No facility-administered medications prior to visit.    Review of Systems  Constitutional:  Negative for weight loss (gained 2 lbs).  HENT: Negative.    Eyes: Negative.   Respiratory: Negative.    Cardiovascular: Negative.   Gastrointestinal: Negative.   Genitourinary:  Positive  for frequency.  Skin: Negative.   Neurological: Negative.   Endo/Heme/Allergies: Negative.        Objective:   BP 133/78   Pulse 60   Temp (!) 96.8 F (36 C)   Ht 6' 3 (1.905 m)   Wt 199 lb 12.8 oz (90.6 kg)   SpO2 98%   BMI 24.97 kg/m   Vitals:   03/04/23 1147  BP: 133/78  Pulse: 60  Temp: (!) 96.8 F (36 C)  Height: 6' 3 (1.905 m)  Weight: 199 lb 12.8 oz (90.6 kg)  SpO2: 98%  BMI (Calculated): 24.97    Physical Exam Vitals reviewed.  Constitutional:      Appearance: Normal appearance.  HENT:     Head: Normocephalic.     Left Ear: There is no impacted cerumen.     Nose: Nose normal.     Mouth/Throat:     Mouth: Mucous membranes are moist.     Pharynx: No posterior oropharyngeal erythema.  Eyes:     Extraocular Movements: Extraocular movements intact.     Pupils: Pupils are equal, round, and reactive to light.  Cardiovascular:     Rate and Rhythm: Regular rhythm.     Chest Wall: PMI is not displaced.     Pulses: Normal pulses.     Heart sounds: S1 normal and S2 normal. Murmur heard.     Crescendo decrescendo systolic murmur is present with a grade of 3/6.  Pulmonary:     Effort: Pulmonary effort is normal.     Breath sounds: Normal air entry. No rhonchi or rales.  Abdominal:     General: Abdomen is flat. Bowel sounds are normal. There is no distension.     Palpations: Abdomen is soft. There is no hepatomegaly, splenomegaly or mass.     Tenderness:  There is no abdominal tenderness.  Musculoskeletal:        General: Normal range of motion.     Cervical back: Normal range of motion and neck supple.     Right lower leg: No edema.     Left lower leg: No edema.  Skin:    General: Skin is warm and dry.  Neurological:     General: No focal deficit present.     Mental Status: He is alert and oriented to person, place, and time.     Cranial Nerves: No cranial nerve deficit.     Motor: No weakness.  Psychiatric:        Mood and Affect: Mood normal.        Behavior: Behavior normal.      Results for orders placed or performed in visit on 03/04/23  POCT CBG (Fasting - Glucose)  Result Value Ref Range   Glucose Fasting, POC 88 70 - 99 mg/dL    Recent Results (from the past 2160 hours)  POCT CBG (Fasting - Glucose)     Status: None   Collection Time: 02/18/23 10:53 AM  Result Value Ref Range   Glucose Fasting, POC 92 70 - 99 mg/dL  Lipid panel     Status: None   Collection Time: 02/18/23 11:52 AM  Result Value Ref Range   Cholesterol, Total 156 100 - 199 mg/dL   Triglycerides 895 0 - 149 mg/dL   HDL 69 >60 mg/dL   VLDL Cholesterol Cal 19 5 - 40 mg/dL   LDL Chol Calc (NIH) 68 0 - 99 mg/dL   Chol/HDL Ratio 2.3 0.0 - 5.0 ratio    Comment:  T. Chol/HDL Ratio                                             Men  Women                               1/2 Avg.Risk  3.4    3.3                                   Avg.Risk  5.0    4.4                                2X Avg.Risk  9.6    7.1                                3X Avg.Risk 23.4   11.0   Comprehensive metabolic panel     Status: Abnormal   Collection Time: 02/18/23 11:52 AM  Result Value Ref Range   Glucose 80 70 - 99 mg/dL   BUN 16 8 - 27 mg/dL   Creatinine, Ser 8.49 (H) 0.76 - 1.27 mg/dL   eGFR 52 (L) >40 fO/fpw/8.26   BUN/Creatinine Ratio 11 10 - 24   Sodium 141 134 - 144 mmol/L   Potassium 4.0 3.5 - 5.2 mmol/L   Chloride 103 96 - 106 mmol/L    CO2 21 20 - 29 mmol/L   Calcium  9.1 8.6 - 10.2 mg/dL   Total Protein 6.4 6.0 - 8.5 g/dL   Albumin  4.2 3.9 - 4.9 g/dL   Globulin, Total 2.2 1.5 - 4.5 g/dL   Bilirubin Total 0.7 0.0 - 1.2 mg/dL   Alkaline Phosphatase 51 44 - 121 IU/L   AST 14 0 - 40 IU/L   ALT 10 0 - 44 IU/L  Hemoglobin A1c     Status: None   Collection Time: 02/18/23 11:52 AM  Result Value Ref Range   Hgb A1c MFr Bld 5.5 4.8 - 5.6 %    Comment:          Prediabetes: 5.7 - 6.4          Diabetes: >6.4          Glycemic control for adults with diabetes: <7.0    Est. average glucose Bld gHb Est-mCnc 111 mg/dL  POCT CBG (Fasting - Glucose)     Status: None   Collection Time: 03/04/23 11:55 AM  Result Value Ref Range   Glucose Fasting, POC 88 70 - 99 mg/dL      Assessment & Plan:  As per problem list, reduce insulin  to 60 units daily. Problem List Items Addressed This Visit       Cardiovascular and Mediastinum   Hypertension     Endocrine   Type 2 diabetes mellitus with hyperlipidemia (HCC) - Primary   Relevant Orders   POCT CBG (Fasting - Glucose) (Completed)     Genitourinary   CKD stage 3a, GFR 45-59 ml/min (HCC)     Other   Hypercholesteremia    Return in about 3 months (around 06/01/2023) for fu with labs prior.   Total time spent: 20 minutes  Sherrill Cinderella Perry, MD  03/04/2023   This document may have been prepared by [facility] Voice Recognition software and as such may include unintentional dictation errors.

## 2023-03-30 ENCOUNTER — Other Ambulatory Visit: Payer: Self-pay | Admitting: Cardiovascular Disease

## 2023-04-08 ENCOUNTER — Other Ambulatory Visit: Payer: Self-pay | Admitting: Cardiovascular Disease

## 2023-05-01 ENCOUNTER — Other Ambulatory Visit: Payer: Self-pay | Admitting: Cardiovascular Disease

## 2023-05-17 ENCOUNTER — Encounter: Payer: Self-pay | Admitting: Cardiovascular Disease

## 2023-05-17 ENCOUNTER — Ambulatory Visit: Payer: Medicaid Other | Admitting: Cardiovascular Disease

## 2023-05-17 VITALS — BP 126/76 | HR 65 | Ht 75.0 in | Wt 197.0 lb

## 2023-05-17 DIAGNOSIS — Z951 Presence of aortocoronary bypass graft: Secondary | ICD-10-CM

## 2023-05-17 DIAGNOSIS — Z0130 Encounter for examination of blood pressure without abnormal findings: Secondary | ICD-10-CM

## 2023-05-17 DIAGNOSIS — E785 Hyperlipidemia, unspecified: Secondary | ICD-10-CM | POA: Diagnosis not present

## 2023-05-17 DIAGNOSIS — N1831 Chronic kidney disease, stage 3a: Secondary | ICD-10-CM | POA: Diagnosis not present

## 2023-05-17 DIAGNOSIS — R0602 Shortness of breath: Secondary | ICD-10-CM | POA: Diagnosis not present

## 2023-05-17 DIAGNOSIS — N401 Enlarged prostate with lower urinary tract symptoms: Secondary | ICD-10-CM

## 2023-05-17 DIAGNOSIS — E1169 Type 2 diabetes mellitus with other specified complication: Secondary | ICD-10-CM

## 2023-05-17 DIAGNOSIS — R351 Nocturia: Secondary | ICD-10-CM

## 2023-05-17 DIAGNOSIS — E7800 Pure hypercholesterolemia, unspecified: Secondary | ICD-10-CM

## 2023-05-17 NOTE — Progress Notes (Signed)
 Cardiology Office Note   Date:  05/17/2023   ID:  [name], DOB Jun 22, 1960, MRN [id number]  PCP:  Andrew Daughters, MD  Cardiologist:  Debborah Fairly, MD      History of Present Illness: [name] is a 63 y.o. male who presents for  Chief Complaint  Patient presents with   Follow-up    6 mo follow up    Doing good, but has occasional SOB      Past Medical History:  Diagnosis Date   Asthma    Cancer [facility])    prostate   Chronic back pain    Chronic neck pain    Cirrhosis of liver (HCC)    Complicated migraine 06/16/2011   Coronary artery disease    Depression    Diabetes mellitus without complication (HCC)    Gallstones    Headache(784.0)    Hepatitis C    Hypercholesteremia    Hypertension    Left leg paresthesias    Myocardial infarction (HCC)    Stroke (HCC)    TIA   Vertigo      Past Surgical History:  Procedure Laterality Date   BYPASS GRAFT     triple bypass surgery   CARDIAC SURGERY     COLONOSCOPY WITH PROPOFOL  N/A 02/15/2017   Procedure: COLONOSCOPY WITH PROPOFOL ;  Surgeon: Irby Mannan, MD;  Location: [facility];  Service: Endoscopy;  Laterality: N/A;   COLONOSCOPY WITH PROPOFOL  N/A 02/15/2022   Procedure: COLONOSCOPY WITH PROPOFOL ;  Surgeon: Luke Salaam, MD;  Location: [facility] ENDOSCOPY;  Service: Gastroenterology;  Laterality: N/A;   COLONOSCOPY WITH PROPOFOL  N/A 04/16/2022   Procedure: COLONOSCOPY WITH PROPOFOL ;  Surgeon: Luke Salaam, MD;  Location: [facility] ENDOSCOPY;  Service: Gastroenterology;  Laterality: N/A;   CORONARY ANGIOPLASTY     CORONARY ARTERY BYPASS GRAFT     ESOPHAGOGASTRODUODENOSCOPY (EGD) WITH PROPOFOL  N/A 02/15/2017   Procedure: ESOPHAGOGASTRODUODENOSCOPY (EGD) WITH PROPOFOL ;  Surgeon: Irby Mannan, MD;  Location: [facility];  Service: Endoscopy;  Laterality: N/A;   HAND SURGERY  1984   KNEE ARTHROPLASTY Left 05/04/2021   LEFT HEART CATH AND CORONARY ANGIOGRAPHY Left  04/29/2017   Procedure: LEFT HEART CATH AND CORONARY ANGIOGRAPHY;  Surgeon: Cherrie Cornwall, MD;  Location: [facility] INVASIVE CV LAB;  Service: Cardiovascular;  Laterality: Left;   LEFT HEART CATH AND CORONARY ANGIOGRAPHY Left 01/08/2019   Procedure: LEFT HEART CATH AND CORONARY ANGIOGRAPHY;  Surgeon: Cherrie Cornwall, MD;  Location: [facility] INVASIVE CV LAB;  Service: Cardiovascular;  Laterality: Left;   LEFT HEART CATH AND CORONARY ANGIOGRAPHY N/A 02/19/2021   Procedure: LEFT HEART CATH AND CORONARY ANGIOGRAPHY with intervention;  Surgeon: Cherrie Cornwall, MD;  Location: [facility] INVASIVE CV LAB;  Service: Cardiovascular;  Laterality: N/A;   ORTHOPEDIC SURGERY     "on my legs"     Current Outpatient Medications  Medication Sig Dispense Refill   acetaminophen  (TYLENOL ) 325 MG tablet Take 325 mg by mouth every 6 (six) hours as needed for moderate pain (pain score 4-6).     albuterol  (VENTOLIN  HFA) 108 (90 Base) MCG/ACT inhaler Inhale 2 puffs into the lungs every 6 (six) hours as needed for wheezing or shortness of breath. 8 g 1   amLODipine  (NORVASC ) 5 MG tablet Take 1 tablet by mouth once daily 90 tablet 0   aspirin  EC 81 MG tablet Take 81 mg by mouth daily.     Calcium  Carbonate-Vitamin D 600-400 MG-UNIT tablet Take 1 tablet by  mouth 2 (two) times daily.      clopidogrel (PLAVIX) 75 MG tablet Take 75 mg by mouth daily.     ezetimibe (ZETIA) 10 MG tablet Take 10 mg by mouth daily.     famotidine  (PEPCID ) 40 MG tablet Take 40 mg by mouth 2 (two) times daily.     finasteride  (PROSCAR ) 5 MG tablet Take 5 mg by mouth daily.     fluticasone  (FLONASE ) 50 MCG/ACT nasal spray Place 2 sprays into both nostrils daily as needed for allergies or rhinitis.     furosemide  (LASIX ) 20 MG tablet Take 1 tablet (20 mg total) by mouth daily as needed for fluid or edema. 30 tablet 0   Glycerin-Hypromellose-PEG 400 (DRY EYE RELIEF DROPS) 0.2-0.2-1 % SOLN Place 1 drop into both eyes daily as needed (Dry eye).     insulin   glargine (LANTUS ) 100 UNIT/ML injection Inject 0.6 mLs (60 Units total) into the skin at bedtime. 18 mL 2   magnesium  oxide (MAG-OX) 400 MG tablet Take 400 mg by mouth daily.     metoprolol  succinate (TOPROL -XL) 25 MG 24 hr tablet Take 1 tablet by mouth once daily 90 tablet 0   pantoprazole  (PROTONIX ) 40 MG tablet Take 40 mg by mouth daily.     ranolazine  (RANEXA ) 1000 MG SR tablet Take 1 tablet by mouth twice daily 180 tablet 0   rosuvastatin  (CRESTOR ) 20 MG tablet Take 20 mg by mouth daily.      tamsulosin  (FLOMAX ) 0.4 MG CAPS capsule Take 1 capsule (0.4 mg total) by mouth daily after breakfast. 30 capsule    venlafaxine  XR (EFFEXOR -XR) 75 MG 24 hr capsule Take 225 mg by mouth daily with breakfast.     No current facility-administered medications for this visit.    Allergies:   Bee venom and Questran [cholestyramine]    Social History:   reports that he quit smoking about 14 years ago. His smoking use included cigarettes. He started smoking about 49 years ago. He has a 35 pack-year smoking history. He has never used smokeless tobacco. He reports that he does not drink alcohol  and does not use drugs.   Family History:  family history includes Diabetes in his brother, father, mother, and sister; Hypertension in his brother, father, mother, and sister.    ROS:     Review of Systems  Constitutional: Negative.   HENT: Negative.    Eyes: Negative.   Respiratory: Negative.    Gastrointestinal: Negative.   Genitourinary: Negative.   Musculoskeletal: Negative.   Skin: Negative.   Neurological: Negative.   Endo/Heme/Allergies: Negative.   Psychiatric/Behavioral: Negative.    All other systems reviewed and are negative.     All other systems are reviewed and negative.    PHYSICAL EXAM: VS:  BP 126/76   Pulse 65   Ht 6\' 3"  (1.905 m)   Wt 197 lb (89.4 kg)   SpO2 96%   BMI 24.62 kg/m  , BMI Body mass index is 24.62 kg/m. Last weight:  Wt Readings from Last 3 Encounters:   05/17/23 197 lb (89.4 kg)  03/04/23 199 lb 12.8 oz (90.6 kg)  02/18/23 197 lb 14.4 oz (89.8 kg)     Physical Exam Vitals reviewed.  Constitutional:      Appearance: Normal appearance. He is normal weight.  HENT:     Head: Normocephalic.     Nose: Nose normal.     Mouth/Throat:     Mouth: Mucous membranes are moist.  Eyes:  Pupils: Pupils are equal, round, and reactive to light.  Cardiovascular:     Rate and Rhythm: Normal rate and regular rhythm.     Pulses: Normal pulses.     Heart sounds: Normal heart sounds.  Pulmonary:     Effort: Pulmonary effort is normal.  Abdominal:     General: Abdomen is flat. Bowel sounds are normal.  Musculoskeletal:        General: Normal range of motion.     Cervical back: Normal range of motion.  Skin:    General: Skin is warm.  Neurological:     General: No focal deficit present.     Mental Status: He is alert.  Psychiatric:        Mood and Affect: Mood normal.       EKG:   Recent Labs: 11/16/2022: Hemoglobin 13.4; Platelets 173 02/18/2023: ALT 10; BUN 16; Creatinine, Ser 1.50; Potassium 4.0; Sodium 141    Lipid Panel    Component Value Date/Time   CHOL 156 02/18/2023 1152   TRIG 104 02/18/2023 1152   HDL 69 02/18/2023 1152   CHOLHDL 2.3 02/18/2023 1152   CHOLHDL 6.8 06/14/2011 0443   VLDL 39 06/14/2011 0443   LDLCALC 68 02/18/2023 1152      Other studies Reviewed: Additional studies/ records that were reviewed today include:  Review of the above records demonstrates:       No data to display            ASSESSMENT AND PLAN:    ICD-10-CM   1. SOB (shortness of breath)  R06.02 PCV ECHOCARDIOGRAM COMPLETE    MYOCARDIAL PERFUSION IMAGING   schedual stress test and echo    2. CKD stage 3a, GFR 45-59 ml/min (HCC)  N18.31 PCV ECHOCARDIOGRAM COMPLETE    MYOCARDIAL PERFUSION IMAGING    3. Benign prostatic hyperplasia with nocturia  N40.1 PCV ECHOCARDIOGRAM COMPLETE   R35.1 MYOCARDIAL PERFUSION IMAGING     4. Hypercholesteremia  E78.00 PCV ECHOCARDIOGRAM COMPLETE    MYOCARDIAL PERFUSION IMAGING    5. Type 2 diabetes mellitus with hyperlipidemia (HCC)  E11.69 PCV ECHOCARDIOGRAM COMPLETE   E78.5 MYOCARDIAL PERFUSION IMAGING    6. S/P CABG (coronary artery bypass graft)  Z95.1 PCV ECHOCARDIOGRAM COMPLETE    MYOCARDIAL PERFUSION IMAGING       Problem List Items Addressed This Visit       Endocrine   Type 2 diabetes mellitus with hyperlipidemia (HCC)   Relevant Orders   PCV ECHOCARDIOGRAM COMPLETE   MYOCARDIAL PERFUSION IMAGING     Genitourinary   CKD stage 3a, GFR 45-59 ml/min (HCC)   Relevant Orders   PCV ECHOCARDIOGRAM COMPLETE   MYOCARDIAL PERFUSION IMAGING     Other   Hypercholesteremia   Relevant Orders   PCV ECHOCARDIOGRAM COMPLETE   MYOCARDIAL PERFUSION IMAGING   S/P CABG (coronary artery bypass graft)   Relevant Orders   PCV ECHOCARDIOGRAM COMPLETE   MYOCARDIAL PERFUSION IMAGING   Benign prostatic hyperplasia with nocturia   Relevant Orders   PCV ECHOCARDIOGRAM COMPLETE   MYOCARDIAL PERFUSION IMAGING   Other Visit Diagnoses       SOB (shortness of breath)    -  Primary   schedual stress test and echo   Relevant Orders   PCV ECHOCARDIOGRAM COMPLETE   MYOCARDIAL PERFUSION IMAGING          Disposition:   Return in about 4 weeks (around 06/14/2023).    Total time spent: 35 minutes  Signed,  Debborah Fairly, MD  05/17/2023 9:18 AM    [facility]

## 2023-05-31 ENCOUNTER — Ambulatory Visit

## 2023-05-31 DIAGNOSIS — N1831 Chronic kidney disease, stage 3a: Secondary | ICD-10-CM

## 2023-05-31 DIAGNOSIS — I340 Nonrheumatic mitral (valve) insufficiency: Secondary | ICD-10-CM | POA: Diagnosis not present

## 2023-05-31 DIAGNOSIS — E7800 Pure hypercholesterolemia, unspecified: Secondary | ICD-10-CM

## 2023-05-31 DIAGNOSIS — N401 Enlarged prostate with lower urinary tract symptoms: Secondary | ICD-10-CM

## 2023-05-31 DIAGNOSIS — R0602 Shortness of breath: Secondary | ICD-10-CM

## 2023-05-31 DIAGNOSIS — Z951 Presence of aortocoronary bypass graft: Secondary | ICD-10-CM

## 2023-05-31 DIAGNOSIS — E1169 Type 2 diabetes mellitus with other specified complication: Secondary | ICD-10-CM

## 2023-06-01 ENCOUNTER — Encounter: Payer: Self-pay | Admitting: Internal Medicine

## 2023-06-01 ENCOUNTER — Ambulatory Visit: Payer: Medicaid Other | Admitting: Internal Medicine

## 2023-06-01 VITALS — BP 122/80 | HR 62 | Temp 94.9°F | Ht 75.0 in | Wt 195.6 lb

## 2023-06-01 DIAGNOSIS — E785 Hyperlipidemia, unspecified: Secondary | ICD-10-CM | POA: Diagnosis not present

## 2023-06-01 DIAGNOSIS — Z0130 Encounter for examination of blood pressure without abnormal findings: Secondary | ICD-10-CM

## 2023-06-01 DIAGNOSIS — E7800 Pure hypercholesterolemia, unspecified: Secondary | ICD-10-CM | POA: Diagnosis not present

## 2023-06-01 DIAGNOSIS — E1129 Type 2 diabetes mellitus with other diabetic kidney complication: Secondary | ICD-10-CM

## 2023-06-01 DIAGNOSIS — N1831 Chronic kidney disease, stage 3a: Secondary | ICD-10-CM | POA: Diagnosis not present

## 2023-06-01 DIAGNOSIS — R809 Proteinuria, unspecified: Secondary | ICD-10-CM

## 2023-06-01 DIAGNOSIS — E1169 Type 2 diabetes mellitus with other specified complication: Secondary | ICD-10-CM | POA: Diagnosis not present

## 2023-06-01 LAB — POC CREATINE & ALBUMIN,URINE
Creatinine, POC: 80 mg/dL
Microalbumin Ur, POC: 100 mg/L

## 2023-06-01 LAB — GLUCOSE, POCT (MANUAL RESULT ENTRY): POC Glucose: 110 mg/dL — AB (ref 70–99)

## 2023-06-01 MED ORDER — EMPAGLIFLOZIN 10 MG PO TABS: 10.0000 mg | ORAL_TABLET | Freq: Every day | ORAL | 2 refills | Status: DC

## 2023-06-01 NOTE — Progress Notes (Signed)
 Established Patient Office Visit  Subjective:  Patient ID: [name], male    DOB: 26-Jul-1960  Age: 63 y.o. MRN: [id number]  Chief Complaint  Patient presents with   Follow-up    3 month follow up    No new complaints, here for lab review and medication refills. Labs reviewed and notable for well controlled diabetes, A1c at target, lipids at target with cmp notable for ckd 3. Microalbuminuria detected. Denies any hypoglycemic episodes and home bg readings have been at target.     No other concerns at this time.   Past Medical History:  Diagnosis Date   Asthma    Cancer [facility])    prostate   Chronic back pain    Chronic neck pain    Cirrhosis of liver (HCC)    Complicated migraine 06/16/2011   Coronary artery disease    Depression    Diabetes mellitus without complication (HCC)    Gallstones    Headache(784.0)    Hepatitis C    Hypercholesteremia    Hypertension    Left leg paresthesias    Myocardial infarction (HCC)    Stroke (HCC)    TIA   Vertigo     Past Surgical History:  Procedure Laterality Date   BYPASS GRAFT     triple bypass surgery   CARDIAC SURGERY     COLONOSCOPY WITH PROPOFOL  N/A 02/15/2017   Procedure: COLONOSCOPY WITH PROPOFOL ;  Surgeon: Irby Mannan, MD;  Location: [facility];  Service: Endoscopy;  Laterality: N/A;   COLONOSCOPY WITH PROPOFOL  N/A 02/15/2022   Procedure: COLONOSCOPY WITH PROPOFOL ;  Surgeon: Luke Salaam, MD;  Location: [facility] ENDOSCOPY;  Service: Gastroenterology;  Laterality: N/A;   COLONOSCOPY WITH PROPOFOL  N/A 04/16/2022   Procedure: COLONOSCOPY WITH PROPOFOL ;  Surgeon: Luke Salaam, MD;  Location: [facility] ENDOSCOPY;  Service: Gastroenterology;  Laterality: N/A;   CORONARY ANGIOPLASTY     CORONARY ARTERY BYPASS GRAFT     ESOPHAGOGASTRODUODENOSCOPY (EGD) WITH PROPOFOL  N/A 02/15/2017   Procedure: ESOPHAGOGASTRODUODENOSCOPY (EGD) WITH PROPOFOL ;  Surgeon: Irby Mannan, MD;  Location: [facility];   Service: Endoscopy;  Laterality: N/A;   HAND SURGERY  1984   KNEE ARTHROPLASTY Left 05/04/2021   LEFT HEART CATH AND CORONARY ANGIOGRAPHY Left 04/29/2017   Procedure: LEFT HEART CATH AND CORONARY ANGIOGRAPHY;  Surgeon: Cherrie Cornwall, MD;  Location: [facility] INVASIVE CV LAB;  Service: Cardiovascular;  Laterality: Left;   LEFT HEART CATH AND CORONARY ANGIOGRAPHY Left 01/08/2019   Procedure: LEFT HEART CATH AND CORONARY ANGIOGRAPHY;  Surgeon: Cherrie Cornwall, MD;  Location: [facility] INVASIVE CV LAB;  Service: Cardiovascular;  Laterality: Left;   LEFT HEART CATH AND CORONARY ANGIOGRAPHY N/A 02/19/2021   Procedure: LEFT HEART CATH AND CORONARY ANGIOGRAPHY with intervention;  Surgeon: Cherrie Cornwall, MD;  Location: [facility] INVASIVE CV LAB;  Service: Cardiovascular;  Laterality: N/A;   ORTHOPEDIC SURGERY     "on my legs"    Social History   Socioeconomic History   Marital status: Legally Separated    Spouse name: Not on file   Number of children: Not on file   Years of education: Not on file   Highest education level: Not on file  Occupational History   Not on file  Tobacco Use   Smoking status: Former    Current packs/day: 0.00    Average packs/day: 1 pack/day for 35.0 years (35.0 ttl pk-yrs)    Types: Cigarettes    Start date: 01/25/1974    Quit date:  01/25/2009    Years since quitting: 14.3   Smokeless tobacco: Never  Vaping Use   Vaping status: Never Used  Substance and Sexual Activity   Alcohol  use: No    Comment: former alcoholic   Drug use: No    Comment: former cocaine abuse   Sexual activity: Not Currently  Other Topics Concern   Not on file  Social History Narrative   Not on file   Social Drivers of Health   Financial Resource Strain: Not on file  Food Insecurity: Not on file  Transportation Needs: Not on file  Physical Activity: Not on file  Stress: Not on file  Social Connections: Not on file  Intimate Partner Violence: Not on file    Family History  Problem Relation  Age of Onset   Diabetes Brother    Diabetes Sister    Diabetes Father    Hypertension Father    Diabetes Mother    Hypertension Mother    Hypertension Sister    Hypertension Brother     Allergies  Allergen Reactions   Bee Venom Anaphylaxis and Swelling   Questran [Cholestyramine] Other (See Comments)    Reaction unknown    Outpatient Medications Prior to Visit  Medication Sig   acetaminophen  (TYLENOL ) 325 MG tablet Take 325 mg by mouth every 6 (six) hours as needed for moderate pain (pain score 4-6).   albuterol  (VENTOLIN  HFA) 108 (90 Base) MCG/ACT inhaler Inhale 2 puffs into the lungs every 6 (six) hours as needed for wheezing or shortness of breath.   amLODipine  (NORVASC ) 5 MG tablet Take 1 tablet by mouth once daily   aspirin  EC 81 MG tablet Take 81 mg by mouth daily.   Calcium  Carbonate-Vitamin D 600-400 MG-UNIT tablet Take 1 tablet by mouth 2 (two) times daily.    clopidogrel (PLAVIX) 75 MG tablet Take 75 mg by mouth daily.   ezetimibe (ZETIA) 10 MG tablet Take 10 mg by mouth daily.   famotidine  (PEPCID ) 40 MG tablet Take 40 mg by mouth 2 (two) times daily.   finasteride  (PROSCAR ) 5 MG tablet Take 5 mg by mouth daily.   fluticasone  (FLONASE ) 50 MCG/ACT nasal spray Place 2 sprays into both nostrils daily as needed for allergies or rhinitis.   furosemide  (LASIX ) 20 MG tablet Take 1 tablet (20 mg total) by mouth daily as needed for fluid or edema.   Glycerin-Hypromellose-PEG 400 (DRY EYE RELIEF DROPS) 0.2-0.2-1 % SOLN Place 1 drop into both eyes daily as needed (Dry eye).   insulin  glargine (LANTUS ) 100 UNIT/ML injection Inject 0.6 mLs (60 Units total) into the skin at bedtime.   magnesium  oxide (MAG-OX) 400 MG tablet Take 400 mg by mouth daily.   metoprolol  succinate (TOPROL -XL) 25 MG 24 hr tablet Take 1 tablet by mouth once daily   pantoprazole  (PROTONIX ) 40 MG tablet Take 40 mg by mouth daily.   ranolazine  (RANEXA ) 1000 MG SR tablet Take 1 tablet by mouth twice daily    rosuvastatin  (CRESTOR ) 20 MG tablet Take 20 mg by mouth daily.    tamsulosin  (FLOMAX ) 0.4 MG CAPS capsule Take 1 capsule (0.4 mg total) by mouth daily after breakfast.   venlafaxine  XR (EFFEXOR -XR) 75 MG 24 hr capsule Take 225 mg by mouth daily with breakfast.   No facility-administered medications prior to visit.    Review of Systems  Constitutional:  Negative for weight loss (gained 2 lbs).  HENT: Negative.    Eyes: Negative.   Respiratory: Negative.    Cardiovascular:  Negative.   Gastrointestinal: Negative.   Genitourinary:  Positive for frequency.  Skin: Negative.   Neurological: Negative.   Endo/Heme/Allergies: Negative.        Objective:   BP 122/80   Pulse 62   Temp (!) 94.9 F (34.9 C)   Ht 6\' 3"  (1.905 m)   Wt 195 lb 9.6 oz (88.7 kg)   SpO2 98%   BMI 24.45 kg/m   Vitals:   06/01/23 0938  BP: 122/80  Pulse: 62  Temp: (!) 94.9 F (34.9 C)  Height: 6\' 3"  (1.905 m)  Weight: 195 lb 9.6 oz (88.7 kg)  SpO2: 98%  BMI (Calculated): 24.45    Physical Exam Vitals reviewed.  Constitutional:      Appearance: Normal appearance.  HENT:     Head: Normocephalic.     Left Ear: There is no impacted cerumen.     Nose: Nose normal.     Mouth/Throat:     Mouth: Mucous membranes are moist.     Pharynx: No posterior oropharyngeal erythema.  Eyes:     Extraocular Movements: Extraocular movements intact.     Pupils: Pupils are equal, round, and reactive to light.  Cardiovascular:     Rate and Rhythm: Regular rhythm.     Chest Wall: PMI is not displaced.     Pulses: Normal pulses.     Heart sounds: S1 normal and S2 normal. Murmur heard.     Crescendo decrescendo systolic murmur is present with a grade of 3/6.  Pulmonary:     Effort: Pulmonary effort is normal.     Breath sounds: Normal air entry. No rhonchi or rales.  Abdominal:     General: Abdomen is flat. Bowel sounds are normal. There is no distension.     Palpations: Abdomen is soft. There is no hepatomegaly,  splenomegaly or mass.     Tenderness: There is no abdominal tenderness.  Musculoskeletal:        General: Normal range of motion.     Cervical back: Normal range of motion and neck supple.     Right lower leg: No edema.     Left lower leg: No edema.  Skin:    General: Skin is warm and dry.  Neurological:     General: No focal deficit present.     Mental Status: He is alert and oriented to person, place, and time.     Cranial Nerves: No cranial nerve deficit.     Motor: No weakness.  Psychiatric:        Mood and Affect: Mood normal.        Behavior: Behavior normal.      Results for orders placed or performed in visit on 06/01/23  POCT Glucose (CBG)  Result Value Ref Range   POC Glucose 110 (A) 70 - 99 mg/dl  POC CREATINE & ALBUMIN ,URINE  Result Value Ref Range   Microalbumin Ur, POC 100 mg/L   Creatinine, POC 80 mg/dL   Albumin /Creatinine Ratio, Urine, POC 30-300     Recent Results (from the past 2160 hours)  POCT CBG (Fasting - Glucose)     Status: None   Collection Time: 03/04/23 11:55 AM  Result Value Ref Range   Glucose Fasting, POC 88 70 - 99 mg/dL  POCT Glucose (CBG)     Status: Abnormal   Collection Time: 06/01/23  9:43 AM  Result Value Ref Range   POC Glucose 110 (A) 70 - 99 mg/dl  POC CREATINE & ALBUMIN ,URINE  Status: Abnormal   Collection Time: 06/01/23  9:48 AM  Result Value Ref Range   Microalbumin Ur, POC 100 mg/L   Creatinine, POC 80 mg/dL   Albumin /Creatinine Ratio, Urine, POC 30-300       Assessment & Plan:  As per problem list, add SGLT-2 for renal protection.  Problem List Items Addressed This Visit       Endocrine   Type 2 diabetes mellitus with hyperlipidemia (HCC) - Primary   Relevant Medications   empagliflozin (JARDIANCE) 10 MG TABS tablet   Other Relevant Orders   POCT Glucose (CBG) (Completed)   POC CREATINE & ALBUMIN ,URINE (Completed)   Other Visit Diagnoses       Microalbuminuria due to type 2 diabetes mellitus (HCC)        Relevant Medications   empagliflozin (JARDIANCE) 10 MG TABS tablet       Return in about 3 months (around 09/01/2023) for fu with labs prior.   Total time spent: 20 minutes  Arzella Bitters, MD  06/01/2023   This document may have been prepared by [facility] Voice Recognition software and as such may include unintentional dictation errors.

## 2023-06-06 ENCOUNTER — Ambulatory Visit (INDEPENDENT_AMBULATORY_CARE_PROVIDER_SITE_OTHER)

## 2023-06-06 DIAGNOSIS — R0602 Shortness of breath: Secondary | ICD-10-CM

## 2023-06-06 DIAGNOSIS — N401 Enlarged prostate with lower urinary tract symptoms: Secondary | ICD-10-CM

## 2023-06-06 DIAGNOSIS — E1169 Type 2 diabetes mellitus with other specified complication: Secondary | ICD-10-CM

## 2023-06-06 DIAGNOSIS — Z951 Presence of aortocoronary bypass graft: Secondary | ICD-10-CM | POA: Diagnosis not present

## 2023-06-06 DIAGNOSIS — N1831 Chronic kidney disease, stage 3a: Secondary | ICD-10-CM

## 2023-06-06 DIAGNOSIS — E7800 Pure hypercholesterolemia, unspecified: Secondary | ICD-10-CM

## 2023-06-06 MED ORDER — TECHNETIUM TC 99M SESTAMIBI GENERIC - CARDIOLITE
30.0000 | Freq: Once | INTRAVENOUS | Status: AC | PRN
  Administered 2023-06-06: 30 via INTRAVENOUS

## 2023-06-06 MED ORDER — TECHNETIUM TC 99M SESTAMIBI GENERIC - CARDIOLITE
10.0000 | Freq: Once | INTRAVENOUS | Status: AC | PRN
  Administered 2023-06-06: 10 via INTRAVENOUS

## 2023-06-16 ENCOUNTER — Ambulatory Visit: Admitting: Cardiovascular Disease

## 2023-06-16 ENCOUNTER — Encounter: Payer: Self-pay | Admitting: Cardiovascular Disease

## 2023-06-16 VITALS — BP 122/60 | HR 60 | Ht 75.0 in | Wt 192.0 lb

## 2023-06-16 DIAGNOSIS — E7800 Pure hypercholesterolemia, unspecified: Secondary | ICD-10-CM | POA: Diagnosis not present

## 2023-06-16 DIAGNOSIS — N1831 Chronic kidney disease, stage 3a: Secondary | ICD-10-CM

## 2023-06-16 DIAGNOSIS — Z951 Presence of aortocoronary bypass graft: Secondary | ICD-10-CM

## 2023-06-16 DIAGNOSIS — E1169 Type 2 diabetes mellitus with other specified complication: Secondary | ICD-10-CM

## 2023-06-16 DIAGNOSIS — E785 Hyperlipidemia, unspecified: Secondary | ICD-10-CM

## 2023-06-16 DIAGNOSIS — I340 Nonrheumatic mitral (valve) insufficiency: Secondary | ICD-10-CM

## 2023-06-16 DIAGNOSIS — R0602 Shortness of breath: Secondary | ICD-10-CM

## 2023-06-16 NOTE — Progress Notes (Signed)
 Cardiology Office Note   Date:  06/16/2023   ID:  [name], DOB 28-Jul-1960, MRN [id number]  PCP:  Shari Daughters, MD  Cardiologist:  Debborah Fairly, MD      History of Present Illness: [name] is a 63 y.o. male who presents for  Chief Complaint  Patient presents with   Follow-up    4 Weeks Follow Up Results    No chest pain or SOB      Past Medical History:  Diagnosis Date   Asthma    Cancer [facility])    prostate   Chronic back pain    Chronic neck pain    Cirrhosis of liver (HCC)    Complicated migraine 06/16/2011   Coronary artery disease    Depression    Diabetes mellitus without complication (HCC)    Gallstones    Headache(784.0)    Hepatitis C    Hypercholesteremia    Hypertension    Left leg paresthesias    Myocardial infarction (HCC)    Stroke (HCC)    TIA   Vertigo      Past Surgical History:  Procedure Laterality Date   BYPASS GRAFT     triple bypass surgery   CARDIAC SURGERY     COLONOSCOPY WITH PROPOFOL  N/A 02/15/2017   Procedure: COLONOSCOPY WITH PROPOFOL ;  Surgeon: Irby Mannan, MD;  Location: [facility];  Service: Endoscopy;  Laterality: N/A;   COLONOSCOPY WITH PROPOFOL  N/A 02/15/2022   Procedure: COLONOSCOPY WITH PROPOFOL ;  Surgeon: Luke Salaam, MD;  Location: [facility] ENDOSCOPY;  Service: Gastroenterology;  Laterality: N/A;   COLONOSCOPY WITH PROPOFOL  N/A 04/16/2022   Procedure: COLONOSCOPY WITH PROPOFOL ;  Surgeon: Luke Salaam, MD;  Location: [facility] ENDOSCOPY;  Service: Gastroenterology;  Laterality: N/A;   CORONARY ANGIOPLASTY     CORONARY ARTERY BYPASS GRAFT     ESOPHAGOGASTRODUODENOSCOPY (EGD) WITH PROPOFOL  N/A 02/15/2017   Procedure: ESOPHAGOGASTRODUODENOSCOPY (EGD) WITH PROPOFOL ;  Surgeon: Irby Mannan, MD;  Location: [facility];  Service: Endoscopy;  Laterality: N/A;   HAND SURGERY  1984   KNEE ARTHROPLASTY Left 05/04/2021   LEFT HEART CATH AND CORONARY ANGIOGRAPHY Left 04/29/2017    Procedure: LEFT HEART CATH AND CORONARY ANGIOGRAPHY;  Surgeon: Cherrie Cornwall, MD;  Location: [facility] INVASIVE CV LAB;  Service: Cardiovascular;  Laterality: Left;   LEFT HEART CATH AND CORONARY ANGIOGRAPHY Left 01/08/2019   Procedure: LEFT HEART CATH AND CORONARY ANGIOGRAPHY;  Surgeon: Cherrie Cornwall, MD;  Location: [facility] INVASIVE CV LAB;  Service: Cardiovascular;  Laterality: Left;   LEFT HEART CATH AND CORONARY ANGIOGRAPHY N/A 02/19/2021   Procedure: LEFT HEART CATH AND CORONARY ANGIOGRAPHY with intervention;  Surgeon: Cherrie Cornwall, MD;  Location: [facility] INVASIVE CV LAB;  Service: Cardiovascular;  Laterality: N/A;   ORTHOPEDIC SURGERY     "on my legs"     Current Outpatient Medications  Medication Sig Dispense Refill   acetaminophen  (TYLENOL ) 325 MG tablet Take 325 mg by mouth every 6 (six) hours as needed for moderate pain (pain score 4-6).     albuterol  (VENTOLIN  HFA) 108 (90 Base) MCG/ACT inhaler Inhale 2 puffs into the lungs every 6 (six) hours as needed for wheezing or shortness of breath. 8 g 1   amLODipine  (NORVASC ) 5 MG tablet Take 1 tablet by mouth once daily 90 tablet 0   aspirin  EC 81 MG tablet Take 81 mg by mouth daily.     Calcium  Carbonate-Vitamin D 600-400 MG-UNIT tablet Take 1 tablet by  mouth 2 (two) times daily.      clopidogrel (PLAVIX) 75 MG tablet Take 75 mg by mouth daily.     empagliflozin  (JARDIANCE ) 10 MG TABS tablet Take 1 tablet (10 mg total) by mouth daily before breakfast. 30 tablet 2   ezetimibe (ZETIA) 10 MG tablet Take 10 mg by mouth daily.     famotidine  (PEPCID ) 40 MG tablet Take 40 mg by mouth 2 (two) times daily.     finasteride  (PROSCAR ) 5 MG tablet Take 5 mg by mouth daily.     fluticasone  (FLONASE ) 50 MCG/ACT nasal spray Place 2 sprays into both nostrils daily as needed for allergies or rhinitis.     Glycerin-Hypromellose-PEG 400 (DRY EYE RELIEF DROPS) 0.2-0.2-1 % SOLN Place 1 drop into both eyes daily as needed (Dry eye).     magnesium  oxide  (MAG-OX) 400 MG tablet Take 400 mg by mouth daily.     metoprolol  succinate (TOPROL -XL) 25 MG 24 hr tablet Take 1 tablet by mouth once daily 90 tablet 0   pantoprazole  (PROTONIX ) 40 MG tablet Take 40 mg by mouth daily.     ranolazine  (RANEXA ) 1000 MG SR tablet Take 1 tablet by mouth twice daily 180 tablet 0   rosuvastatin  (CRESTOR ) 20 MG tablet Take 20 mg by mouth daily.      tamsulosin  (FLOMAX ) 0.4 MG CAPS capsule Take 1 capsule (0.4 mg total) by mouth daily after breakfast. 30 capsule    venlafaxine  XR (EFFEXOR -XR) 75 MG 24 hr capsule Take 225 mg by mouth daily with breakfast.     furosemide  (LASIX ) 20 MG tablet Take 1 tablet (20 mg total) by mouth daily as needed for fluid or edema. 30 tablet 0   insulin  glargine (LANTUS ) 100 UNIT/ML injection Inject 0.6 mLs (60 Units total) into the skin at bedtime. 18 mL 2   No current facility-administered medications for this visit.    Allergies:   Bee venom and Questran [cholestyramine]    Social History:   reports that he quit smoking about 14 years ago. His smoking use included cigarettes. He started smoking about 49 years ago. He has a 35 pack-year smoking history. He has never used smokeless tobacco. He reports that he does not drink alcohol  and does not use drugs.   Family History:  family history includes Diabetes in his brother, father, mother, and sister; Hypertension in his brother, father, mother, and sister.    ROS:     Review of Systems  Constitutional: Negative.   HENT: Negative.    Eyes: Negative.   Respiratory: Negative.    Gastrointestinal: Negative.   Genitourinary: Negative.   Musculoskeletal: Negative.   Skin: Negative.   Neurological: Negative.   Endo/Heme/Allergies: Negative.   Psychiatric/Behavioral: Negative.    All other systems reviewed and are negative.     All other systems are reviewed and negative.    PHYSICAL EXAM: VS:  BP 122/60   Pulse 60   Ht 6\' 3"  (1.905 m)   Wt 192 lb (87.1 kg)   SpO2 97%   BMI  24.00 kg/m  , BMI Body mass index is 24 kg/m. Last weight:  Wt Readings from Last 3 Encounters:  06/16/23 192 lb (87.1 kg)  06/01/23 195 lb 9.6 oz (88.7 kg)  05/17/23 197 lb (89.4 kg)     Physical Exam Vitals reviewed.  Constitutional:      Appearance: Normal appearance. He is normal weight.  HENT:     Head: Normocephalic.     Nose:  Nose normal.     Mouth/Throat:     Mouth: Mucous membranes are moist.  Eyes:     Pupils: Pupils are equal, round, and reactive to light.  Cardiovascular:     Rate and Rhythm: Normal rate and regular rhythm.     Pulses: Normal pulses.     Heart sounds: Normal heart sounds.  Pulmonary:     Effort: Pulmonary effort is normal.  Abdominal:     General: Abdomen is flat. Bowel sounds are normal.  Musculoskeletal:        General: Normal range of motion.     Cervical back: Normal range of motion.  Skin:    General: Skin is warm.  Neurological:     General: No focal deficit present.     Mental Status: He is alert.  Psychiatric:        Mood and Affect: Mood normal.       EKG:   Recent Labs: 11/16/2022: Hemoglobin 13.4; Platelets 173 02/18/2023: ALT 10; BUN 16; Creatinine, Ser 1.50; Potassium 4.0; Sodium 141    Lipid Panel    Component Value Date/Time   CHOL 156 02/18/2023 1152   TRIG 104 02/18/2023 1152   HDL 69 02/18/2023 1152   CHOLHDL 2.3 02/18/2023 1152   CHOLHDL 6.8 06/14/2011 0443   VLDL 39 06/14/2011 0443   LDLCALC 68 02/18/2023 1152      Other studies Reviewed: Additional studies/ records that were reviewed today include:  Review of the above records demonstrates:       No data to display            ASSESSMENT AND PLAN:    ICD-10-CM   1. Type 2 diabetes mellitus with hyperlipidemia (HCC)  E11.69    E78.5     2. S/P CABG (coronary artery bypass graft)  Z95.1    LVEF 63 %, trace MR    3. Hypercholesteremia  E78.00     4. CKD stage 3a, GFR 45-59 ml/min (HCC)  N18.31     5. SOB (shortness of breath)  R06.02      6. Nonrheumatic mitral valve regurgitation  I34.0    trace       Problem List Items Addressed This Visit       Endocrine   Type 2 diabetes mellitus with hyperlipidemia (HCC) - Primary     Genitourinary   CKD stage 3a, GFR 45-59 ml/min (HCC)     Other   Hypercholesteremia   S/P CABG (coronary artery bypass graft)   Other Visit Diagnoses       SOB (shortness of breath)         Nonrheumatic mitral valve regurgitation       trace          Disposition:   Return in about 3 months (around 09/16/2023).    Total time spent: 30 minutes  Signed,  Debborah Fairly, MD  06/16/2023 9:20 AM    [facility]

## 2023-06-27 ENCOUNTER — Other Ambulatory Visit: Payer: Self-pay | Admitting: Cardiology

## 2023-06-27 ENCOUNTER — Other Ambulatory Visit: Payer: Self-pay | Admitting: Cardiovascular Disease

## 2023-07-04 ENCOUNTER — Other Ambulatory Visit: Payer: Self-pay | Admitting: Cardiology

## 2023-08-07 ENCOUNTER — Other Ambulatory Visit: Payer: Self-pay | Admitting: Internal Medicine

## 2023-08-07 ENCOUNTER — Other Ambulatory Visit: Payer: Self-pay | Admitting: Cardiovascular Disease

## 2023-08-07 DIAGNOSIS — E1129 Type 2 diabetes mellitus with other diabetic kidney complication: Secondary | ICD-10-CM

## 2023-09-07 ENCOUNTER — Ambulatory Visit: Admitting: Internal Medicine

## 2023-09-16 ENCOUNTER — Ambulatory Visit: Admitting: Cardiovascular Disease

## 2023-09-16 ENCOUNTER — Other Ambulatory Visit: Payer: Self-pay | Admitting: Internal Medicine

## 2023-09-16 DIAGNOSIS — E1129 Type 2 diabetes mellitus with other diabetic kidney complication: Secondary | ICD-10-CM

## 2023-09-27 ENCOUNTER — Other Ambulatory Visit: Payer: Self-pay | Admitting: Internal Medicine

## 2023-09-29 ENCOUNTER — Ambulatory Visit: Admitting: Cardiovascular Disease

## 2023-09-29 ENCOUNTER — Encounter: Payer: Self-pay | Admitting: Cardiovascular Disease

## 2023-09-29 VITALS — BP 122/82 | HR 61 | Ht 72.0 in | Wt 184.2 lb

## 2023-09-29 DIAGNOSIS — Z951 Presence of aortocoronary bypass graft: Secondary | ICD-10-CM

## 2023-09-29 DIAGNOSIS — R42 Dizziness and giddiness: Secondary | ICD-10-CM | POA: Diagnosis not present

## 2023-09-29 DIAGNOSIS — I959 Hypotension, unspecified: Secondary | ICD-10-CM | POA: Diagnosis not present

## 2023-09-29 DIAGNOSIS — I2510 Atherosclerotic heart disease of native coronary artery without angina pectoris: Secondary | ICD-10-CM | POA: Diagnosis not present

## 2023-09-29 DIAGNOSIS — E7800 Pure hypercholesterolemia, unspecified: Secondary | ICD-10-CM

## 2023-09-29 NOTE — Progress Notes (Signed)
 Cardiology Office Note   Date:  09/29/2023   ID:  [name], DOB 1960/03/16, MRN [id number]  PCP:  Albina GORMAN Dine, MD  Cardiologist:  Denyse Bathe, MD      History of Present Illness: [name] is a 63 y.o. male who presents for  Chief Complaint  Patient presents with   Follow-up    3 month follow up, having affects from a medicine     Feels dizzy, as BPS 88 at times.      Past Medical History:  Diagnosis Date   Asthma    Cancer [facility])    prostate   Chronic back pain    Chronic neck pain    Cirrhosis of liver (HCC)    Complicated migraine 06/16/2011   Coronary artery disease    Depression    Diabetes mellitus without complication (HCC)    Gallstones    Headache(784.0)    Hepatitis C    Hypercholesteremia    Hypertension    Left leg paresthesias    Myocardial infarction (HCC)    Stroke (HCC)    TIA   Vertigo      Past Surgical History:  Procedure Laterality Date   BYPASS GRAFT     triple bypass surgery   CARDIAC SURGERY     COLONOSCOPY WITH PROPOFOL  N/A 02/15/2017   Procedure: COLONOSCOPY WITH PROPOFOL ;  Surgeon: Janalyn Keene NOVAK, MD;  Location: [facility] SURGERY CNTR;  Service: Endoscopy;  Laterality: N/A;   COLONOSCOPY WITH PROPOFOL  N/A 02/15/2022   Procedure: COLONOSCOPY WITH PROPOFOL ;  Surgeon: Therisa Bi, MD;  Location: [facility] ENDOSCOPY;  Service: Gastroenterology;  Laterality: N/A;   COLONOSCOPY WITH PROPOFOL  N/A 04/16/2022   Procedure: COLONOSCOPY WITH PROPOFOL ;  Surgeon: Therisa Bi, MD;  Location: [facility] ENDOSCOPY;  Service: Gastroenterology;  Laterality: N/A;   CORONARY ANGIOPLASTY     CORONARY ARTERY BYPASS GRAFT     ESOPHAGOGASTRODUODENOSCOPY (EGD) WITH PROPOFOL  N/A 02/15/2017   Procedure: ESOPHAGOGASTRODUODENOSCOPY (EGD) WITH PROPOFOL ;  Surgeon: Janalyn Keene NOVAK, MD;  Location: [facility] SURGERY CNTR;  Service: Endoscopy;  Laterality: N/A;   HAND SURGERY  1984   KNEE ARTHROPLASTY Left 05/04/2021   LEFT HEART CATH AND  CORONARY ANGIOGRAPHY Left 04/29/2017   Procedure: LEFT HEART CATH AND CORONARY ANGIOGRAPHY;  Surgeon: Bathe Denyse LABOR, MD;  Location: [facility] INVASIVE CV LAB;  Service: Cardiovascular;  Laterality: Left;   LEFT HEART CATH AND CORONARY ANGIOGRAPHY Left 01/08/2019   Procedure: LEFT HEART CATH AND CORONARY ANGIOGRAPHY;  Surgeon: Bathe Denyse LABOR, MD;  Location: [facility] INVASIVE CV LAB;  Service: Cardiovascular;  Laterality: Left;   LEFT HEART CATH AND CORONARY ANGIOGRAPHY N/A 02/19/2021   Procedure: LEFT HEART CATH AND CORONARY ANGIOGRAPHY with intervention;  Surgeon: Bathe Denyse LABOR, MD;  Location: [facility] INVASIVE CV LAB;  Service: Cardiovascular;  Laterality: N/A;   ORTHOPEDIC SURGERY     on my legs     Current Outpatient Medications  Medication Sig Dispense Refill   acetaminophen  (TYLENOL ) 325 MG tablet Take 325 mg by mouth every 6 (six) hours as needed for moderate pain (pain score 4-6).     albuterol  (VENTOLIN  HFA) 108 (90 Base) MCG/ACT inhaler Inhale 2 puffs into the lungs every 6 (six) hours as needed for wheezing or shortness of breath. 8 g 1   aspirin  EC 81 MG tablet Take 81 mg by mouth daily.     Calcium  Carbonate-Vitamin D 600-400 MG-UNIT tablet Take 1 tablet by mouth 2 (two) times daily.  clopidogrel (PLAVIX) 75 MG tablet Take 75 mg by mouth daily.     ezetimibe (ZETIA) 10 MG tablet Take 10 mg by mouth daily.     famotidine  (PEPCID ) 40 MG tablet Take 40 mg by mouth 2 (two) times daily.     finasteride  (PROSCAR ) 5 MG tablet Take 5 mg by mouth daily.     fluticasone  (FLONASE ) 50 MCG/ACT nasal spray Place 2 sprays into both nostrils daily as needed for allergies or rhinitis.     furosemide  (LASIX ) 20 MG tablet Take 1 tablet (20 mg total) by mouth daily as needed for fluid or edema. 30 tablet 0   Glycerin-Hypromellose-PEG 400 (DRY EYE RELIEF DROPS) 0.2-0.2-1 % SOLN Place 1 drop into both eyes daily as needed (Dry eye).     insulin  glargine (LANTUS ) 100 UNIT/ML injection Inject 0.6 mLs (60  Units total) into the skin at bedtime. 18 mL 2   JARDIANCE  10 MG TABS tablet TAKE 1 TABLET BY MOUTH ONCE DAILY BEFORE BREAKFAST 30 tablet 0   magnesium  oxide (MAG-OX) 400 MG tablet Take 400 mg by mouth daily.     metoprolol  succinate (TOPROL -XL) 25 MG 24 hr tablet Take 1 tablet by mouth once daily 90 tablet 0   pantoprazole  (PROTONIX ) 40 MG tablet Take 40 mg by mouth daily.     ranolazine  (RANEXA ) 1000 MG SR tablet Take 1 tablet by mouth twice daily 180 tablet 0   rosuvastatin  (CRESTOR ) 20 MG tablet Take 20 mg by mouth daily.      tamsulosin  (FLOMAX ) 0.4 MG CAPS capsule Take 1 capsule (0.4 mg total) by mouth daily after breakfast. 30 capsule    venlafaxine  XR (EFFEXOR -XR) 75 MG 24 hr capsule Take 225 mg by mouth daily with breakfast.     No current facility-administered medications for this visit.    Allergies:   Bee venom and Questran [cholestyramine]    Social History:   reports that he quit smoking about 14 years ago. His smoking use included cigarettes. He started smoking about 49 years ago. He has a 35 pack-year smoking history. He has never used smokeless tobacco. He reports that he does not drink alcohol  and does not use drugs.   Family History:  family history includes Diabetes in his brother, father, mother, and sister; Hypertension in his brother, father, mother, and sister.    ROS:     Review of Systems  Constitutional: Negative.   HENT: Negative.    Eyes: Negative.   Respiratory: Negative.    Gastrointestinal: Negative.   Genitourinary: Negative.   Musculoskeletal: Negative.   Skin: Negative.   Neurological: Negative.   Endo/Heme/Allergies: Negative.   Psychiatric/Behavioral: Negative.    All other systems reviewed and are negative.     All other systems are reviewed and negative.    PHYSICAL EXAM: VS:  BP 122/82   Pulse 61   Ht 6' (1.829 m)   Wt 184 lb 3.2 oz (83.6 kg)   SpO2 96%   BMI 24.98 kg/m  , BMI Body mass index is 24.98 kg/m. Last weight:  Wt  Readings from Last 3 Encounters:  09/29/23 184 lb 3.2 oz (83.6 kg)  06/16/23 192 lb (87.1 kg)  06/01/23 195 lb 9.6 oz (88.7 kg)     Physical Exam Vitals reviewed.  Constitutional:      Appearance: Normal appearance. He is normal weight.  HENT:     Head: Normocephalic.     Nose: Nose normal.     Mouth/Throat:  Mouth: Mucous membranes are moist.  Eyes:     Pupils: Pupils are equal, round, and reactive to light.  Cardiovascular:     Rate and Rhythm: Normal rate and regular rhythm.     Pulses: Normal pulses.     Heart sounds: Normal heart sounds.  Pulmonary:     Effort: Pulmonary effort is normal.  Abdominal:     General: Abdomen is flat. Bowel sounds are normal.  Musculoskeletal:        General: Normal range of motion.     Cervical back: Normal range of motion.  Skin:    General: Skin is warm.  Neurological:     General: No focal deficit present.     Mental Status: He is alert.  Psychiatric:        Mood and Affect: Mood normal.       EKG:   Recent Labs: 11/16/2022: Hemoglobin 13.4; Platelets 173 02/18/2023: ALT 10; BUN 16; Creatinine, Ser 1.50; Potassium 4.0; Sodium 141    Lipid Panel    Component Value Date/Time   CHOL 156 02/18/2023 1152   TRIG 104 02/18/2023 1152   HDL 69 02/18/2023 1152   CHOLHDL 2.3 02/18/2023 1152   CHOLHDL 6.8 06/14/2011 0443   VLDL 39 06/14/2011 0443   LDLCALC 68 02/18/2023 1152      Other studies Reviewed: Additional studies/ records that were reviewed today include:  Review of the above records demonstrates:       No data to display            ASSESSMENT AND PLAN:    ICD-10-CM   1. Dizziness  R42    due to hypotension.    2. Coronary artery disease involving native coronary artery of native heart without angina pectoris  I25.10     3. Hypotension, unspecified hypotension type  I95.9    Hold amlodapine as feels dizzy.    4. S/P CABG (coronary artery bypass graft)  Z95.1     5. Hypercholesteremia  E78.00         Problem List Items Addressed This Visit       Cardiovascular and Mediastinum   CAD (coronary artery disease) (Chronic)   Hypotension     Other   Hypercholesteremia   S/P CABG (coronary artery bypass graft)   Other Visit Diagnoses       Dizziness    -  Primary   due to hypotension.          Disposition:   Return in about 4 weeks (around 10/27/2023).    Total time spent: 30 minutes  Signed,  Denyse Bathe, MD  09/29/2023 9:24 AM    [facility]

## 2023-09-30 LAB — COMPREHENSIVE METABOLIC PANEL WITH GFR
ALT: 9 IU/L (ref 0–44)
AST: 15 IU/L (ref 0–40)
Albumin: 4.2 g/dL (ref 3.9–4.9)
Alkaline Phosphatase: 64 IU/L (ref 44–121)
BUN/Creatinine Ratio: 16 (ref 10–24)
BUN: 19 mg/dL (ref 8–27)
Bilirubin Total: 0.7 mg/dL (ref 0.0–1.2)
CO2: 21 mmol/L (ref 20–29)
Calcium: 9.4 mg/dL (ref 8.6–10.2)
Chloride: 104 mmol/L (ref 96–106)
Creatinine, Ser: 1.16 mg/dL (ref 0.76–1.27)
Globulin, Total: 2.4 g/dL (ref 1.5–4.5)
Glucose: 95 mg/dL (ref 70–99)
Potassium: 3.6 mmol/L (ref 3.5–5.2)
Sodium: 139 mmol/L (ref 134–144)
Total Protein: 6.6 g/dL (ref 6.0–8.5)
eGFR: 70 mL/min/1.73 (ref >59)

## 2023-09-30 LAB — LIPID PANEL
Chol/HDL Ratio: 2.4 ratio (ref 0.0–5.0)
Cholesterol, Total: 163 mg/dL (ref 100–199)
HDL: 67 mg/dL (ref >39)
LDL Chol Calc (NIH): 68 mg/dL (ref 0–99)
Triglycerides: 166 mg/dL — ABNORMAL HIGH (ref 0–149)
VLDL Cholesterol Cal: 28 mg/dL (ref 5–40)

## 2023-09-30 LAB — HEMOGLOBIN A1C
Est. average glucose Bld gHb Est-mCnc: 108 mg/dL
Hgb A1c MFr Bld: 5.4 % (ref 4.8–5.6)

## 2023-10-05 ENCOUNTER — Ambulatory Visit: Admitting: Internal Medicine

## 2023-10-05 ENCOUNTER — Encounter: Payer: Self-pay | Admitting: Internal Medicine

## 2023-10-05 ENCOUNTER — Ambulatory Visit: Payer: Self-pay | Admitting: Internal Medicine

## 2023-10-05 VITALS — BP 142/82 | HR 68 | Ht 73.0 in | Wt 187.2 lb

## 2023-10-05 DIAGNOSIS — E1169 Type 2 diabetes mellitus with other specified complication: Secondary | ICD-10-CM | POA: Diagnosis not present

## 2023-10-05 DIAGNOSIS — E782 Mixed hyperlipidemia: Secondary | ICD-10-CM | POA: Insufficient documentation

## 2023-10-05 DIAGNOSIS — I10 Essential (primary) hypertension: Secondary | ICD-10-CM

## 2023-10-05 LAB — POCT CBG (FASTING - GLUCOSE)-MANUAL ENTRY: Glucose Fasting, POC: 99 mg/dL (ref 70–99)

## 2023-10-05 MED ORDER — RAMIPRIL 2.5 MG PO CAPS: 2.5000 mg | ORAL_CAPSULE | Freq: Every day | ORAL | 0 refills | Status: DC

## 2023-10-05 MED ORDER — INSULIN GLARGINE 100 UNIT/ML ~~LOC~~ SOLN: 50.0000 [IU] | Freq: Every day | SUBCUTANEOUS | Status: DC

## 2023-10-05 NOTE — Progress Notes (Signed)
 Established Patient Office Visit  Subjective:  Patient ID: [name], male    DOB: September 18, 1960  Age: 63 y.o. MRN: [id number]  Chief Complaint  Patient presents with   Results    3 month lab results    Dizziness has resolved after amlodipine  dc'd but bp elevated today. Also here for lab review and medication refills. Labs reviewed and notable for well controlled diabetes, A1c at target, lipids at target with cmp notable for normalization of gfr. Denies any hypoglycemic episodes and home bg readings have been at target.     No other concerns at this time.   Past Medical History:  Diagnosis Date   Asthma    Cancer [facility])    prostate   Chronic back pain    Chronic neck pain    Cirrhosis of liver (HCC)    Complicated migraine 06/16/2011   Coronary artery disease    Depression    Diabetes mellitus without complication (HCC)    Gallstones    Headache(784.0)    Hepatitis C    Hypercholesteremia    Hypertension    Left leg paresthesias    Myocardial infarction (HCC)    Stroke (HCC)    TIA   Vertigo     Past Surgical History:  Procedure Laterality Date   BYPASS GRAFT     triple bypass surgery   CARDIAC SURGERY     COLONOSCOPY WITH PROPOFOL  N/A 02/15/2017   Procedure: COLONOSCOPY WITH PROPOFOL ;  Surgeon: Janalyn Keene NOVAK, MD;  Location: [facility] SURGERY CNTR;  Service: Endoscopy;  Laterality: N/A;   COLONOSCOPY WITH PROPOFOL  N/A 02/15/2022   Procedure: COLONOSCOPY WITH PROPOFOL ;  Surgeon: Therisa Bi, MD;  Location: [facility] ENDOSCOPY;  Service: Gastroenterology;  Laterality: N/A;   COLONOSCOPY WITH PROPOFOL  N/A 04/16/2022   Procedure: COLONOSCOPY WITH PROPOFOL ;  Surgeon: Therisa Bi, MD;  Location: [facility] ENDOSCOPY;  Service: Gastroenterology;  Laterality: N/A;   CORONARY ANGIOPLASTY     CORONARY ARTERY BYPASS GRAFT     ESOPHAGOGASTRODUODENOSCOPY (EGD) WITH PROPOFOL  N/A 02/15/2017   Procedure: ESOPHAGOGASTRODUODENOSCOPY (EGD) WITH PROPOFOL ;  Surgeon: Janalyn Keene NOVAK, MD;  Location: [facility] SURGERY CNTR;  Service: Endoscopy;  Laterality: N/A;   HAND SURGERY  1984   KNEE ARTHROPLASTY Left 05/04/2021   LEFT HEART CATH AND CORONARY ANGIOGRAPHY Left 04/29/2017   Procedure: LEFT HEART CATH AND CORONARY ANGIOGRAPHY;  Surgeon: Fernand Denyse LABOR, MD;  Location: [facility] INVASIVE CV LAB;  Service: Cardiovascular;  Laterality: Left;   LEFT HEART CATH AND CORONARY ANGIOGRAPHY Left 01/08/2019   Procedure: LEFT HEART CATH AND CORONARY ANGIOGRAPHY;  Surgeon: Fernand Denyse LABOR, MD;  Location: [facility] INVASIVE CV LAB;  Service: Cardiovascular;  Laterality: Left;   LEFT HEART CATH AND CORONARY ANGIOGRAPHY N/A 02/19/2021   Procedure: LEFT HEART CATH AND CORONARY ANGIOGRAPHY with intervention;  Surgeon: Fernand Denyse LABOR, MD;  Location: [facility] INVASIVE CV LAB;  Service: Cardiovascular;  Laterality: N/A;   ORTHOPEDIC SURGERY     on my legs    Social History   Socioeconomic History   Marital status: Legally Separated    Spouse name: Not on file   Number of children: Not on file   Years of education: Not on file   Highest education level: Not on file  Occupational History   Not on file  Tobacco Use   Smoking status: Former    Current packs/day: 0.00    Average packs/day: 1 pack/day for 35.0 years (35.0 ttl pk-yrs)    Types: Cigarettes    Start  date: 01/25/1974    Quit date: 01/25/2009    Years since quitting: 14.7   Smokeless tobacco: Never  Vaping Use   Vaping status: Never Used  Substance and Sexual Activity   Alcohol  use: No    Comment: former alcoholic   Drug use: No    Comment: former cocaine abuse   Sexual activity: Not Currently  Other Topics Concern   Not on file  Social History Narrative   Not on file   Social Drivers of Health   Financial Resource Strain: Not on file  Food Insecurity: Not on file  Transportation Needs: Not on file  Physical Activity: Not on file  Stress: Not on file  Social Connections: Not on file  Intimate Partner Violence: Not  on file    Family History  Problem Relation Age of Onset   Diabetes Brother    Diabetes Sister    Diabetes Father    Hypertension Father    Diabetes Mother    Hypertension Mother    Hypertension Sister    Hypertension Brother     Allergies  Allergen Reactions   Bee Venom Anaphylaxis and Swelling   Questran [Cholestyramine] Other (See Comments)    Reaction unknown    Outpatient Medications Prior to Visit  Medication Sig   acetaminophen  (TYLENOL ) 325 MG tablet Take 325 mg by mouth every 6 (six) hours as needed for moderate pain (pain score 4-6).   albuterol  (VENTOLIN  HFA) 108 (90 Base) MCG/ACT inhaler Inhale 2 puffs into the lungs every 6 (six) hours as needed for wheezing or shortness of breath.   aspirin  EC 81 MG tablet Take 81 mg by mouth daily.   Calcium  Carbonate-Vitamin D 600-400 MG-UNIT tablet Take 1 tablet by mouth 2 (two) times daily.    clopidogrel (PLAVIX) 75 MG tablet Take 75 mg by mouth daily.   ezetimibe (ZETIA) 10 MG tablet Take 10 mg by mouth daily.   famotidine  (PEPCID ) 40 MG tablet Take 40 mg by mouth 2 (two) times daily.   finasteride  (PROSCAR ) 5 MG tablet Take 5 mg by mouth daily.   fluticasone  (FLONASE ) 50 MCG/ACT nasal spray Place 2 sprays into both nostrils daily as needed for allergies or rhinitis.   furosemide  (LASIX ) 20 MG tablet Take 1 tablet (20 mg total) by mouth daily as needed for fluid or edema.   Glycerin-Hypromellose-PEG 400 (DRY EYE RELIEF DROPS) 0.2-0.2-1 % SOLN Place 1 drop into both eyes daily as needed (Dry eye).   JARDIANCE  10 MG TABS tablet TAKE 1 TABLET BY MOUTH ONCE DAILY BEFORE BREAKFAST   magnesium  oxide (MAG-OX) 400 MG tablet Take 400 mg by mouth daily.   metoprolol  succinate (TOPROL -XL) 25 MG 24 hr tablet Take 1 tablet by mouth once daily   pantoprazole  (PROTONIX ) 40 MG tablet Take 40 mg by mouth daily.   ranolazine  (RANEXA ) 1000 MG SR tablet Take 1 tablet by mouth twice daily   rosuvastatin  (CRESTOR ) 20 MG tablet Take 20 mg by  mouth daily.    tamsulosin  (FLOMAX ) 0.4 MG CAPS capsule Take 1 capsule (0.4 mg total) by mouth daily after breakfast.   venlafaxine  XR (EFFEXOR -XR) 75 MG 24 hr capsule Take 225 mg by mouth daily with breakfast.   [DISCONTINUED] insulin  glargine (LANTUS ) 100 UNIT/ML injection Inject 0.6 mLs (60 Units total) into the skin at bedtime.   clotrimazole (LOTRIMIN) 1 % cream Apply 1 Application topically.   No facility-administered medications prior to visit.    Review of Systems  Constitutional:  Negative for  weight loss (gained 3 lbs).  HENT: Negative.    Eyes: Negative.   Respiratory: Negative.    Cardiovascular: Negative.   Gastrointestinal: Negative.   Genitourinary:  Positive for frequency.  Skin: Negative.   Neurological: Negative.   Endo/Heme/Allergies: Negative.        Objective:   BP (!) 142/82   Pulse 68   Ht 6' 1 (1.854 m)   Wt 187 lb 3.2 oz (84.9 kg)   SpO2 98%   BMI 24.70 kg/m   Vitals:   10/05/23 0903  BP: (!) 142/82  Pulse: 68  Height: 6' 1 (1.854 m)  Weight: 187 lb 3.2 oz (84.9 kg)  SpO2: 98%  BMI (Calculated): 24.7    Physical Exam Vitals reviewed.  Constitutional:      Appearance: Normal appearance.  HENT:     Head: Normocephalic.     Left Ear: There is no impacted cerumen.     Nose: Nose normal.     Mouth/Throat:     Mouth: Mucous membranes are moist.     Pharynx: No posterior oropharyngeal erythema.  Eyes:     Extraocular Movements: Extraocular movements intact.     Pupils: Pupils are equal, round, and reactive to light.  Cardiovascular:     Rate and Rhythm: Regular rhythm.     Chest Wall: PMI is not displaced.     Pulses: Normal pulses.     Heart sounds: S1 normal and S2 normal. Murmur heard.     Crescendo decrescendo systolic murmur is present with a grade of 3/6.  Pulmonary:     Effort: Pulmonary effort is normal.     Breath sounds: Normal air entry. No rhonchi or rales.  Abdominal:     General: Abdomen is flat. Bowel sounds are  normal. There is no distension.     Palpations: Abdomen is soft. There is no hepatomegaly, splenomegaly or mass.     Tenderness: There is no abdominal tenderness.  Musculoskeletal:        General: Normal range of motion.     Cervical back: Normal range of motion and neck supple.     Right lower leg: No edema.     Left lower leg: No edema.  Skin:    General: Skin is warm and dry.  Neurological:     General: No focal deficit present.     Mental Status: He is alert and oriented to person, place, and time.     Cranial Nerves: No cranial nerve deficit.     Motor: No weakness.  Psychiatric:        Mood and Affect: Mood normal.        Behavior: Behavior normal.      Results for orders placed or performed in visit on 10/05/23  POCT CBG (Fasting - Glucose)  Result Value Ref Range   Glucose Fasting, POC 99 70 - 99 mg/dL    Recent Results (from the past 2160 hours)  Comprehensive metabolic panel     Status: None   Collection Time: 09/29/23  9:33 AM  Result Value Ref Range   Glucose 95 70 - 99 mg/dL   BUN 19 8 - 27 mg/dL   Creatinine, Ser 8.83 0.76 - 1.27 mg/dL   eGFR 70 >40 fO/fpw/8.26   BUN/Creatinine Ratio 16 10 - 24   Sodium 139 134 - 144 mmol/L   Potassium 3.6 3.5 - 5.2 mmol/L   Chloride 104 96 - 106 mmol/L   CO2 21 20 - 29 mmol/L  Calcium  9.4 8.6 - 10.2 mg/dL   Total Protein 6.6 6.0 - 8.5 g/dL   Albumin  4.2 3.9 - 4.9 g/dL   Globulin, Total 2.4 1.5 - 4.5 g/dL   Bilirubin Total 0.7 0.0 - 1.2 mg/dL   Alkaline Phosphatase 64 44 - 121 IU/L    Comment: **Effective October 10, 2023 Alkaline Phosphatase**   reference interval will be changing to:              Age                Male          Male           0 -  5 days         47 - 127       47 - 127           6 - 10 days         29 - 242       29 - 242          11 - 20 days        109 - 357      109 - 357          21 - 30 days         94 - 494       94 - 494           1 -  2 months      149 - 539      149 - 539            3 -  6 months      131 - 452      131 - 452           7 - 11 months      117 - 401      117 - 401   12 months -  6 years       158 - 369      158 - 369           7 - 12 years       150 - 409      150 - 409               13 years       156 - 435       78 - 227               14 years       114 - 375       64 - 161               15 years        88 - 279       56 - 134               16 years        74 - 207       51 - 121               17 years        63 - 161       47 - 113          18 - 20 years        51 - 125       42 - 106  21 - 50 years         47 - 123       41 - 116          51 - 80 years        49 - 135       51 - 125              >80 years        48 - 129       48 - 129    AST 15 0 - 40 IU/L   ALT 9 0 - 44 IU/L  Lipid panel     Status: Abnormal   Collection Time: 09/29/23  9:33 AM  Result Value Ref Range   Cholesterol, Total 163 100 - 199 mg/dL   Triglycerides 833 (H) 0 - 149 mg/dL   HDL 67 >60 mg/dL   VLDL Cholesterol Cal 28 5 - 40 mg/dL   LDL Chol Calc (NIH) 68 0 - 99 mg/dL   Chol/HDL Ratio 2.4 0.0 - 5.0 ratio    Comment:                                   T. Chol/HDL Ratio                                             Men  Women                               1/2 Avg.Risk  3.4    3.3                                   Avg.Risk  5.0    4.4                                2X Avg.Risk  9.6    7.1                                3X Avg.Risk 23.4   11.0   Hemoglobin A1c     Status: None   Collection Time: 09/29/23  9:34 AM  Result Value Ref Range   Hgb A1c MFr Bld 5.4 4.8 - 5.6 %    Comment:          Prediabetes: 5.7 - 6.4          Diabetes: >6.4          Glycemic control for adults with diabetes: <7.0    Est. average glucose Bld gHb Est-mCnc 108 mg/dL  POCT CBG (Fasting - Glucose)     Status: None   Collection Time: 10/05/23  9:08 AM  Result Value Ref Range   Glucose Fasting, POC 99 70 - 99 mg/dL      Assessment & Plan:  Mert was seen today for  results.  Type 2 diabetes mellitus with hyperlipidemia (HCC) -     POCT CBG (Fasting - Glucose) -     Ramipril ; Take 1 capsule (2.5 mg total) by mouth daily.  Dispense: 30 capsule; Refill: 0 -     Insulin  Glargine; Inject 0.5 mLs (50 Units total) into the skin at bedtime.  Primary hypertension -     Ramipril ; Take 1 capsule (2.5 mg total) by mouth daily.  Dispense: 30 capsule; Refill: 0  Mixed hyperlipidemia    Problem List Items Addressed This Visit       Cardiovascular and Mediastinum   Hypertension   Relevant Medications   ramipril  (ALTACE ) 2.5 MG capsule     Endocrine   Type 2 diabetes mellitus with hyperlipidemia (HCC) - Primary   Relevant Medications   ramipril  (ALTACE ) 2.5 MG capsule   insulin  glargine (LANTUS ) 100 UNIT/ML injection   Other Relevant Orders   POCT CBG (Fasting - Glucose) (Completed)     Other   Mixed hyperlipidemia   Relevant Medications   ramipril  (ALTACE ) 2.5 MG capsule    Return in about 3 weeks (around 10/26/2023) for BP followup.   Total time spent: 20 minutes  Sherrill Cinderella Perry, MD  10/05/2023   This document may have been prepared by [facility] Voice Recognition software and as such may include unintentional dictation errors.

## 2023-10-06 ENCOUNTER — Other Ambulatory Visit: Payer: Self-pay | Admitting: Cardiovascular Disease

## 2023-10-16 ENCOUNTER — Other Ambulatory Visit: Payer: Self-pay | Admitting: Internal Medicine

## 2023-10-16 ENCOUNTER — Other Ambulatory Visit: Payer: Self-pay | Admitting: Cardiovascular Disease

## 2023-10-16 DIAGNOSIS — E1129 Type 2 diabetes mellitus with other diabetic kidney complication: Secondary | ICD-10-CM

## 2023-10-26 ENCOUNTER — Other Ambulatory Visit: Payer: Self-pay | Admitting: Internal Medicine

## 2023-10-26 ENCOUNTER — Encounter: Payer: Self-pay | Admitting: Internal Medicine

## 2023-10-26 ENCOUNTER — Ambulatory Visit (INDEPENDENT_AMBULATORY_CARE_PROVIDER_SITE_OTHER): Admitting: Internal Medicine

## 2023-10-26 VITALS — BP 144/90 | HR 64 | Temp 96.7°F | Ht 73.0 in | Wt 187.8 lb

## 2023-10-26 DIAGNOSIS — I10 Essential (primary) hypertension: Secondary | ICD-10-CM | POA: Diagnosis not present

## 2023-10-26 DIAGNOSIS — E1169 Type 2 diabetes mellitus with other specified complication: Secondary | ICD-10-CM

## 2023-10-26 DIAGNOSIS — E785 Hyperlipidemia, unspecified: Secondary | ICD-10-CM

## 2023-10-26 DIAGNOSIS — R809 Proteinuria, unspecified: Secondary | ICD-10-CM | POA: Diagnosis not present

## 2023-10-26 DIAGNOSIS — E1129 Type 2 diabetes mellitus with other diabetic kidney complication: Secondary | ICD-10-CM | POA: Insufficient documentation

## 2023-10-26 LAB — POCT CBG (FASTING - GLUCOSE)-MANUAL ENTRY: Glucose Fasting, POC: 98 mg/dL (ref 70–99)

## 2023-10-26 MED ORDER — RAMIPRIL 5 MG PO CAPS: 5.0000 mg | ORAL_CAPSULE | Freq: Every day | ORAL | 0 refills | Status: DC

## 2023-10-26 MED ORDER — EMPAGLIFLOZIN 10 MG PO TABS: 10.0000 mg | ORAL_TABLET | Freq: Every day | ORAL | 2 refills | Status: DC

## 2023-10-26 NOTE — Progress Notes (Signed)
 Established Patient Office Visit  Subjective:  Patient ID: [name], male    DOB: [birth year]  Age: 63 y.o. MRN: [id number]  Chief Complaint  Patient presents with   Follow-up    3 week follow up    No new complaints, bp elevated today and denies noncompliance with medications or salt intake.    No other concerns at this time.   Past Medical History:  Diagnosis Date   Asthma    Cancer [facility])    prostate   Chronic back pain    Chronic neck pain    Cirrhosis of liver (HCC)    Complicated migraine 06/16/2011   Coronary artery disease    Depression    Diabetes mellitus without complication (HCC)    Gallstones    Headache(784.0)    Hepatitis C    Hypercholesteremia    Hypertension    Left leg paresthesias    Myocardial infarction (HCC)    Stroke (HCC)    TIA   Vertigo     Past Surgical History:  Procedure Laterality Date   BYPASS GRAFT     triple bypass surgery   CARDIAC SURGERY     COLONOSCOPY WITH PROPOFOL  N/A 02/15/2017   Procedure: COLONOSCOPY WITH PROPOFOL ;  Surgeon: Janalyn Keene NOVAK, MD;  Location: [facility] SURGERY [facility];  Service: Endoscopy;  Laterality: N/A;   COLONOSCOPY WITH PROPOFOL  N/A 02/15/2022   Procedure: COLONOSCOPY WITH PROPOFOL ;  Surgeon: Therisa Bi, MD;  Location: [facility] ENDOSCOPY;  Service: Gastroenterology;  Laterality: N/A;   COLONOSCOPY WITH PROPOFOL  N/A 04/16/2022   Procedure: COLONOSCOPY WITH PROPOFOL ;  Surgeon: Therisa Bi, MD;  Location: [facility] ENDOSCOPY;  Service: Gastroenterology;  Laterality: N/A;   CORONARY ANGIOPLASTY     CORONARY ARTERY BYPASS GRAFT     ESOPHAGOGASTRODUODENOSCOPY (EGD) WITH PROPOFOL  N/A 02/15/2017   Procedure: ESOPHAGOGASTRODUODENOSCOPY (EGD) WITH PROPOFOL ;  Surgeon: Janalyn Keene NOVAK, MD;  Location: [facility] SURGERY [facility];  Service: Endoscopy;  Laterality: N/A;   HAND SURGERY  1984   KNEE ARTHROPLASTY Left 05/04/2021   LEFT HEART CATH AND CORONARY ANGIOGRAPHY Left 04/29/2017   Procedure: LEFT HEART CATH  AND CORONARY ANGIOGRAPHY;  Surgeon: Fernand Denyse LABOR, MD;  Location: [facility] INVASIVE CV LAB;  Service: Cardiovascular;  Laterality: Left;   LEFT HEART CATH AND CORONARY ANGIOGRAPHY Left 01/08/2019   Procedure: LEFT HEART CATH AND CORONARY ANGIOGRAPHY;  Surgeon: Fernand Denyse LABOR, MD;  Location: [facility] INVASIVE CV LAB;  Service: Cardiovascular;  Laterality: Left;   LEFT HEART CATH AND CORONARY ANGIOGRAPHY N/A 02/19/2021   Procedure: LEFT HEART CATH AND CORONARY ANGIOGRAPHY with intervention;  Surgeon: Fernand Denyse LABOR, MD;  Location: [facility] INVASIVE CV LAB;  Service: Cardiovascular;  Laterality: N/A;   ORTHOPEDIC SURGERY     on my legs    Social History   Socioeconomic History   Marital status: Legally Separated    Spouse name: Not on file   Number of children: Not on file   Years of education: Not on file   Highest education level: Not on file  Occupational History   Not on file  Tobacco Use   Smoking status: Former    Current packs/day: 0.00    Average packs/day: 1 pack/day for 35.0 years (35.0 ttl pk-yrs)    Types: Cigarettes    Start date: 01/25/1974    Quit date: 01/25/2009    Years since quitting: 14.7   Smokeless tobacco: Never  Vaping Use   Vaping status: Never Used  Substance and Sexual Activity   Alcohol  use:  No    Comment: former alcoholic   Drug use: No    Comment: former cocaine abuse   Sexual activity: Not Currently  Other Topics Concern   Not on file  Social History Narrative   Not on file   Social Drivers of Health   Financial Resource Strain: Not on file  Food Insecurity: Not on file  Transportation Needs: Not on file  Physical Activity: Not on file  Stress: Not on file  Social Connections: Not on file  Intimate Partner Violence: Not on file    Family History  Problem Relation Age of Onset   Diabetes Brother    Diabetes Sister    Diabetes Father    Hypertension Father    Diabetes Mother    Hypertension Mother    Hypertension Sister    Hypertension  Brother     Allergies  Allergen Reactions   Bee Venom Anaphylaxis and Swelling   Questran [Cholestyramine] Other (See Comments)    Reaction unknown    Outpatient Medications Prior to Visit  Medication Sig   acetaminophen  (TYLENOL ) 325 MG tablet Take 325 mg by mouth every 6 (six) hours as needed for moderate pain (pain score 4-6).   albuterol  (VENTOLIN  HFA) 108 (90 Base) MCG/ACT inhaler Inhale 2 puffs into the lungs every 6 (six) hours as needed for wheezing or shortness of breath.   aspirin  EC 81 MG tablet Take 81 mg by mouth daily.   Calcium  Carbonate-Vitamin D 600-400 MG-UNIT tablet Take 1 tablet by mouth 2 (two) times daily.    clopidogrel (PLAVIX) 75 MG tablet Take 75 mg by mouth daily.   clotrimazole (LOTRIMIN) 1 % cream Apply 1 Application topically.   ezetimibe (ZETIA) 10 MG tablet Take 10 mg by mouth daily.   famotidine  (PEPCID ) 40 MG tablet Take 40 mg by mouth 2 (two) times daily.   finasteride  (PROSCAR ) 5 MG tablet Take 5 mg by mouth daily.   fluticasone  (FLONASE ) 50 MCG/ACT nasal spray Place 2 sprays into both nostrils daily as needed for allergies or rhinitis.   furosemide  (LASIX ) 20 MG tablet Take 1 tablet (20 mg total) by mouth daily as needed for fluid or edema.   Glycerin-Hypromellose-PEG 400 (DRY EYE RELIEF DROPS) 0.2-0.2-1 % SOLN Place 1 drop into both eyes daily as needed (Dry eye).   insulin  glargine (LANTUS ) 100 UNIT/ML injection Inject 0.5 mLs (50 Units total) into the skin at bedtime.   magnesium  oxide (MAG-OX) 400 MG tablet Take 400 mg by mouth daily.   metoprolol  succinate (TOPROL -XL) 25 MG 24 hr tablet Take 1 tablet by mouth once daily   pantoprazole  (PROTONIX ) 40 MG tablet Take 40 mg by mouth daily.   ranolazine  (RANEXA ) 1000 MG SR tablet Take 1 tablet by mouth twice daily   rosuvastatin  (CRESTOR ) 20 MG tablet Take 20 mg by mouth daily.    tamsulosin  (FLOMAX ) 0.4 MG CAPS capsule Take 1 capsule (0.4 mg total) by mouth daily after breakfast.   venlafaxine  XR  (EFFEXOR -XR) 75 MG 24 hr capsule Take 225 mg by mouth daily with breakfast.   [DISCONTINUED] JARDIANCE  10 MG TABS tablet TAKE 1 TABLET BY MOUTH ONCE DAILY BEFORE BREAKFAST   [DISCONTINUED] ramipril  (ALTACE ) 2.5 MG capsule Take 1 capsule (2.5 mg total) by mouth daily.   No facility-administered medications prior to visit.    Review of Systems  Constitutional:  Negative for weight loss (gained 3 lbs).  HENT: Negative.    Eyes: Negative.   Respiratory: Negative.    Cardiovascular:  Negative.   Gastrointestinal: Negative.   Genitourinary:  Positive for frequency.  Skin: Negative.   Neurological: Negative.   Endo/Heme/Allergies: Negative.        Objective:   BP (!) 144/90   Pulse 64   Temp (!) 96.7 F (35.9 C) (Tympanic)   Ht 6' 1 (1.854 m)   Wt 187 lb 12.8 oz (85.2 kg)   SpO2 98%   BMI 24.78 kg/m   Vitals:   10/26/23 1058  BP: (!) 144/90  Pulse: 64  Temp: (!) 96.7 F (35.9 C)  Height: 6' 1 (1.854 m)  Weight: 187 lb 12.8 oz (85.2 kg)  SpO2: 98%  TempSrc: Tympanic  BMI (Calculated): 24.78    Physical Exam Vitals reviewed.  Constitutional:      Appearance: Normal appearance.  HENT:     Head: Normocephalic.     Left Ear: There is no impacted cerumen.     Nose: Nose normal.     Mouth/Throat:     Mouth: Mucous membranes are moist.     Pharynx: No posterior oropharyngeal erythema.  Eyes:     Extraocular Movements: Extraocular movements intact.     Pupils: Pupils are equal, round, and reactive to light.  Cardiovascular:     Rate and Rhythm: Regular rhythm.     Chest Wall: PMI is not displaced.     Pulses: Normal pulses.     Heart sounds: S1 normal and S2 normal. Murmur heard.     Crescendo decrescendo systolic murmur is present with a grade of 3/6.  Pulmonary:     Effort: Pulmonary effort is normal.     Breath sounds: Normal air entry. No rhonchi or rales.  Abdominal:     General: Abdomen is flat. Bowel sounds are normal. There is no distension.      Palpations: Abdomen is soft. There is no hepatomegaly, splenomegaly or mass.     Tenderness: There is no abdominal tenderness.  Musculoskeletal:        General: Normal range of motion.     Cervical back: Normal range of motion and neck supple.     Right lower leg: No edema.     Left lower leg: No edema.  Skin:    General: Skin is warm and dry.  Neurological:     General: No focal deficit present.     Mental Status: He is alert and oriented to person, place, and time.     Cranial Nerves: No cranial nerve deficit.     Motor: No weakness.  Psychiatric:        Mood and Affect: Mood normal.        Behavior: Behavior normal.      Results for orders placed or performed in visit on 10/26/23  POCT CBG (Fasting - Glucose)  Result Value Ref Range   Glucose Fasting, POC 98 70 - 99 mg/dL        Assessment & Plan:  Shadi was seen today for follow-up.  Type 2 diabetes mellitus with hyperlipidemia (HCC) -     POCT CBG (Fasting - Glucose)  Microalbuminuria due to type 2 diabetes mellitus (HCC) -     Empagliflozin ; Take 1 tablet (10 mg total) by mouth daily before breakfast.  Dispense: 30 tablet; Refill: 2  Uncontrolled hypertension -     Ramipril ; Take 1 capsule (5 mg total) by mouth daily.  Dispense: 30 capsule; Refill: 0    Problem List Items Addressed This Visit       Endocrine  Type 2 diabetes mellitus with hyperlipidemia (HCC) - Primary   Relevant Medications   ramipril  (ALTACE ) 5 MG capsule   empagliflozin  (JARDIANCE ) 10 MG TABS tablet   Other Relevant Orders   POCT CBG (Fasting - Glucose) (Completed)   Other Visit Diagnoses       Microalbuminuria due to type 2 diabetes mellitus (HCC)       Relevant Medications   ramipril  (ALTACE ) 5 MG capsule   empagliflozin  (JARDIANCE ) 10 MG TABS tablet     Uncontrolled hypertension       Relevant Medications   ramipril  (ALTACE ) 5 MG capsule       Return in about 3 weeks (around 11/16/2023) for BP followup.   Total time  spent: 20 minutes  Sherrill Cinderella Perry, MD  10/26/2023   This document may have been prepared by [facility] Voice Recognition software and as such may include unintentional dictation errors.

## 2023-11-03 ENCOUNTER — Encounter: Payer: Self-pay | Admitting: Cardiovascular Disease

## 2023-11-03 ENCOUNTER — Ambulatory Visit: Admitting: Cardiovascular Disease

## 2023-11-03 VITALS — BP 128/82 | HR 77 | Ht 73.0 in | Wt 185.8 lb

## 2023-11-03 DIAGNOSIS — R55 Syncope and collapse: Secondary | ICD-10-CM | POA: Diagnosis not present

## 2023-11-03 DIAGNOSIS — Z951 Presence of aortocoronary bypass graft: Secondary | ICD-10-CM | POA: Diagnosis not present

## 2023-11-03 DIAGNOSIS — I2510 Atherosclerotic heart disease of native coronary artery without angina pectoris: Secondary | ICD-10-CM

## 2023-11-03 DIAGNOSIS — I8510 Secondary esophageal varices without bleeding: Secondary | ICD-10-CM | POA: Diagnosis not present

## 2023-11-03 DIAGNOSIS — Z131 Encounter for screening for diabetes mellitus: Secondary | ICD-10-CM

## 2023-11-03 DIAGNOSIS — I10 Essential (primary) hypertension: Secondary | ICD-10-CM

## 2023-11-03 NOTE — Progress Notes (Signed)
 Cardiology Office Note   Date:  11/03/2023   ID:  [name], DOB 1961-01-13, MRN [id number]  PCP:  Albina GORMAN Dine, MD  Cardiologist:  Denyse Bathe, MD      History of Present Illness: [name] is a 63 y.o. male who presents for  Chief Complaint  Patient presents with   Follow-up    1 month follow up    No chest pain or SOB.      Past Medical History:  Diagnosis Date   Asthma    Cancer [facility])    prostate   Chronic back pain    Chronic neck pain    Cirrhosis of liver (HCC)    Complicated migraine 06/16/2011   Coronary artery disease    Depression    Diabetes mellitus without complication (HCC)    Gallstones    Headache(784.0)    Hepatitis C    Hypercholesteremia    Hypertension    Left leg paresthesias    Myocardial infarction (HCC)    Stroke (HCC)    TIA   Vertigo      Past Surgical History:  Procedure Laterality Date   BYPASS GRAFT     triple bypass surgery   CARDIAC SURGERY     COLONOSCOPY WITH PROPOFOL  N/A 02/15/2017   Procedure: COLONOSCOPY WITH PROPOFOL ;  Surgeon: Janalyn Keene NOVAK, MD;  Location: [facility];  Service: Endoscopy;  Laterality: N/A;   COLONOSCOPY WITH PROPOFOL  N/A 02/15/2022   Procedure: COLONOSCOPY WITH PROPOFOL ;  Surgeon: Therisa Bi, MD;  Location: [facility] ENDOSCOPY;  Service: Gastroenterology;  Laterality: N/A;   COLONOSCOPY WITH PROPOFOL  N/A 04/16/2022   Procedure: COLONOSCOPY WITH PROPOFOL ;  Surgeon: Therisa Bi, MD;  Location: [facility] ENDOSCOPY;  Service: Gastroenterology;  Laterality: N/A;   CORONARY ANGIOPLASTY     CORONARY ARTERY BYPASS GRAFT     ESOPHAGOGASTRODUODENOSCOPY (EGD) WITH PROPOFOL  N/A 02/15/2017   Procedure: ESOPHAGOGASTRODUODENOSCOPY (EGD) WITH PROPOFOL ;  Surgeon: Janalyn Keene NOVAK, MD;  Location: [facility];  Service: Endoscopy;  Laterality: N/A;   HAND SURGERY  1984   KNEE ARTHROPLASTY Left 05/04/2021   LEFT HEART CATH AND CORONARY ANGIOGRAPHY Left 04/29/2017    Procedure: LEFT HEART CATH AND CORONARY ANGIOGRAPHY;  Surgeon: Bathe Denyse LABOR, MD;  Location: [facility] INVASIVE CV LAB;  Service: Cardiovascular;  Laterality: Left;   LEFT HEART CATH AND CORONARY ANGIOGRAPHY Left 01/08/2019   Procedure: LEFT HEART CATH AND CORONARY ANGIOGRAPHY;  Surgeon: Bathe Denyse LABOR, MD;  Location: [facility] INVASIVE CV LAB;  Service: Cardiovascular;  Laterality: Left;   LEFT HEART CATH AND CORONARY ANGIOGRAPHY N/A 02/19/2021   Procedure: LEFT HEART CATH AND CORONARY ANGIOGRAPHY with intervention;  Surgeon: Bathe Denyse LABOR, MD;  Location: [facility] INVASIVE CV LAB;  Service: Cardiovascular;  Laterality: N/A;   ORTHOPEDIC SURGERY     on my legs     Current Outpatient Medications  Medication Sig Dispense Refill   acetaminophen  (TYLENOL ) 325 MG tablet Take 325 mg by mouth every 6 (six) hours as needed for moderate pain (pain score 4-6).     albuterol  (VENTOLIN  HFA) 108 (90 Base) MCG/ACT inhaler Inhale 2 puffs into the lungs every 6 (six) hours as needed for wheezing or shortness of breath. 8 g 1   aspirin  EC 81 MG tablet Take 81 mg by mouth daily.     Calcium  Carbonate-Vitamin D 600-400 MG-UNIT tablet Take 1 tablet by mouth 2 (two) times daily.      clopidogrel (PLAVIX) 75 MG tablet Take 75 mg  by mouth daily.     clotrimazole (LOTRIMIN) 1 % cream Apply 1 Application topically.     empagliflozin  (JARDIANCE ) 10 MG TABS tablet Take 1 tablet (10 mg total) by mouth daily before breakfast. 30 tablet 2   ezetimibe (ZETIA) 10 MG tablet Take 10 mg by mouth daily.     famotidine  (PEPCID ) 40 MG tablet Take 40 mg by mouth 2 (two) times daily.     finasteride  (PROSCAR ) 5 MG tablet Take 5 mg by mouth daily.     fluticasone  (FLONASE ) 50 MCG/ACT nasal spray Place 2 sprays into both nostrils daily as needed for allergies or rhinitis.     furosemide  (LASIX ) 20 MG tablet Take 1 tablet (20 mg total) by mouth daily as needed for fluid or edema. 30 tablet 0   Glycerin-Hypromellose-PEG 400 (DRY EYE RELIEF  DROPS) 0.2-0.2-1 % SOLN Place 1 drop into both eyes daily as needed (Dry eye).     insulin  glargine (LANTUS ) 100 UNIT/ML injection Inject 0.5 mLs (50 Units total) into the skin at bedtime.     magnesium  oxide (MAG-OX) 400 MG tablet Take 400 mg by mouth daily.     metoprolol  succinate (TOPROL -XL) 25 MG 24 hr tablet Take 1 tablet by mouth once daily 90 tablet 0   pantoprazole  (PROTONIX ) 40 MG tablet Take 40 mg by mouth daily.     ramipril  (ALTACE ) 5 MG capsule Take 1 capsule (5 mg total) by mouth daily. 30 capsule 0   ranolazine  (RANEXA ) 1000 MG SR tablet Take 1 tablet by mouth twice daily 180 tablet 0   rosuvastatin  (CRESTOR ) 20 MG tablet Take 20 mg by mouth daily.      tamsulosin  (FLOMAX ) 0.4 MG CAPS capsule Take 1 capsule (0.4 mg total) by mouth daily after breakfast. 30 capsule    venlafaxine  XR (EFFEXOR -XR) 75 MG 24 hr capsule Take 225 mg by mouth daily with breakfast.     No current facility-administered medications for this visit.    Allergies:   Bee venom and Questran [cholestyramine]    Social History:   reports that he quit smoking about 14 years ago. His smoking use included cigarettes. He started smoking about 49 years ago. He has a 35 pack-year smoking history. He has never used smokeless tobacco. He reports that he does not drink alcohol  and does not use drugs.   Family History:  family history includes Diabetes in his brother, father, mother, and sister; Hypertension in his brother, father, mother, and sister.    ROS:     Review of Systems  Constitutional: Negative.   HENT: Negative.    Eyes: Negative.   Respiratory: Negative.    Gastrointestinal: Negative.   Genitourinary: Negative.   Musculoskeletal: Negative.   Skin: Negative.   Neurological: Negative.   Endo/Heme/Allergies: Negative.   Psychiatric/Behavioral: Negative.    All other systems reviewed and are negative.     All other systems are reviewed and negative.    PHYSICAL EXAM: VS:  BP 128/82   Pulse  77   Ht 6' 1 (1.854 m)   Wt 185 lb 12.8 oz (84.3 kg)   SpO2 98%   BMI 24.51 kg/m  , BMI Body mass index is 24.51 kg/m. Last weight:  Wt Readings from Last 3 Encounters:  11/03/23 185 lb 12.8 oz (84.3 kg)  10/26/23 187 lb 12.8 oz (85.2 kg)  10/05/23 187 lb 3.2 oz (84.9 kg)     Physical Exam Vitals reviewed.  Constitutional:      Appearance:  Normal appearance. He is normal weight.  HENT:     Head: Normocephalic.     Nose: Nose normal.     Mouth/Throat:     Mouth: Mucous membranes are moist.  Eyes:     Pupils: Pupils are equal, round, and reactive to light.  Cardiovascular:     Rate and Rhythm: Normal rate and regular rhythm.     Pulses: Normal pulses.     Heart sounds: Normal heart sounds.  Pulmonary:     Effort: Pulmonary effort is normal.  Abdominal:     General: Abdomen is flat. Bowel sounds are normal.  Musculoskeletal:        General: Normal range of motion.     Cervical back: Normal range of motion.  Skin:    General: Skin is warm.  Neurological:     General: No focal deficit present.     Mental Status: He is alert.  Psychiatric:        Mood and Affect: Mood normal.       EKG:   Recent Labs: 11/16/2022: Hemoglobin 13.4; Platelets 173 09/29/2023: ALT 9; BUN 19; Creatinine, Ser 1.16; Potassium 3.6; Sodium 139    Lipid Panel    Component Value Date/Time   CHOL 163 09/29/2023 0933   TRIG 166 (H) 09/29/2023 0933   HDL 67 09/29/2023 0933   CHOLHDL 2.4 09/29/2023 0933   CHOLHDL 6.8 06/14/2011 0443   VLDL 39 06/14/2011 0443   LDLCALC 68 09/29/2023 0933      Other studies Reviewed: Additional studies/ records that were reviewed today include:  Review of the above records demonstrates:       No data to display            ASSESSMENT AND PLAN:    ICD-10-CM   1. Coronary artery disease involving native coronary artery of native heart without angina pectoris  I25.10     2. Primary hypertension  I10    Ramapril changed to 5 mg from 2.5 as it  was high. In ppast had lw BP, but feels no dizziness.    3. Secondary esophageal varices without bleeding (HCC)  I85.10     4. Syncope, near  R55     5. S/P CABG (coronary artery bypass graft)  Z95.1    stress test normal.       Problem List Items Addressed This Visit       Cardiovascular and Mediastinum   CAD (coronary artery disease) - Primary (Chronic)   Hypertension   Syncope, near   Secondary esophageal varices without bleeding (HCC)     Other   S/P CABG (coronary artery bypass graft)       Disposition:   Return in about 3 months (around 02/03/2024).    Total time spent: 30 minutes  Signed,  Denyse Bathe, MD  11/03/2023 10:24 AM    [facility]

## 2023-11-16 ENCOUNTER — Encounter: Payer: Self-pay | Admitting: Internal Medicine

## 2023-11-16 ENCOUNTER — Ambulatory Visit (INDEPENDENT_AMBULATORY_CARE_PROVIDER_SITE_OTHER): Admitting: Internal Medicine

## 2023-11-16 VITALS — BP 160/90 | HR 60 | Ht 72.0 in | Wt 189.0 lb

## 2023-11-16 DIAGNOSIS — E1129 Type 2 diabetes mellitus with other diabetic kidney complication: Secondary | ICD-10-CM

## 2023-11-16 DIAGNOSIS — I10 Essential (primary) hypertension: Secondary | ICD-10-CM

## 2023-11-16 DIAGNOSIS — R809 Proteinuria, unspecified: Secondary | ICD-10-CM

## 2023-11-16 LAB — POCT CBG (FASTING - GLUCOSE)-MANUAL ENTRY: Glucose Fasting, POC: 90 mg/dL (ref 70–99)

## 2023-11-16 NOTE — Progress Notes (Signed)
 Established Patient Office Visit  Subjective:  Patient ID: [name], male    DOB: [birth year]  Age: 63 y.o. MRN: [id number]  Chief Complaint  Patient presents with   Follow-up    3 week BP    No new complaints, here for BP review and medication refills. Took otc Alka Seltzer for nasal discharge yesterday. BP still elevated on recheck.     No other concerns at this time.   Past Medical History:  Diagnosis Date   Asthma    Cancer [facility])    prostate   Chronic back pain    Chronic neck pain    Cirrhosis of liver (HCC)    Complicated migraine 06/16/2011   Coronary artery disease    Depression    Diabetes mellitus without complication (HCC)    Gallstones    Headache(784.0)    Hepatitis C    Hypercholesteremia    Hypertension    Left leg paresthesias    Myocardial infarction (HCC)    Stroke (HCC)    TIA   Vertigo     Past Surgical History:  Procedure Laterality Date   BYPASS GRAFT     triple bypass surgery   CARDIAC SURGERY     COLONOSCOPY WITH PROPOFOL  N/A 02/15/2017   Procedure: COLONOSCOPY WITH PROPOFOL ;  Surgeon: Janalyn Keene NOVAK, MD;  Location: [facility];  Service: Endoscopy;  Laterality: N/A;   COLONOSCOPY WITH PROPOFOL  N/A 02/15/2022   Procedure: COLONOSCOPY WITH PROPOFOL ;  Surgeon: Therisa Bi, MD;  Location: [facility] ENDOSCOPY;  Service: Gastroenterology;  Laterality: N/A;   COLONOSCOPY WITH PROPOFOL  N/A 04/16/2022   Procedure: COLONOSCOPY WITH PROPOFOL ;  Surgeon: Therisa Bi, MD;  Location: [facility] ENDOSCOPY;  Service: Gastroenterology;  Laterality: N/A;   CORONARY ANGIOPLASTY     CORONARY ARTERY BYPASS GRAFT     ESOPHAGOGASTRODUODENOSCOPY (EGD) WITH PROPOFOL  N/A 02/15/2017   Procedure: ESOPHAGOGASTRODUODENOSCOPY (EGD) WITH PROPOFOL ;  Surgeon: Janalyn Keene NOVAK, MD;  Location: [facility];  Service: Endoscopy;  Laterality: N/A;   HAND SURGERY  1984   KNEE ARTHROPLASTY Left 05/04/2021   LEFT HEART CATH AND CORONARY ANGIOGRAPHY  Left 04/29/2017   Procedure: LEFT HEART CATH AND CORONARY ANGIOGRAPHY;  Surgeon: Fernand Denyse LABOR, MD;  Location: [facility] INVASIVE CV LAB;  Service: Cardiovascular;  Laterality: Left;   LEFT HEART CATH AND CORONARY ANGIOGRAPHY Left 01/08/2019   Procedure: LEFT HEART CATH AND CORONARY ANGIOGRAPHY;  Surgeon: Fernand Denyse LABOR, MD;  Location: [facility] INVASIVE CV LAB;  Service: Cardiovascular;  Laterality: Left;   LEFT HEART CATH AND CORONARY ANGIOGRAPHY N/A 02/19/2021   Procedure: LEFT HEART CATH AND CORONARY ANGIOGRAPHY with intervention;  Surgeon: Fernand Denyse LABOR, MD;  Location: [facility] INVASIVE CV LAB;  Service: Cardiovascular;  Laterality: N/A;   ORTHOPEDIC SURGERY     on my legs    Social History   Socioeconomic History   Marital status: Legally Separated    Spouse name: Not on file   Number of children: Not on file   Years of education: Not on file   Highest education level: Not on file  Occupational History   Not on file  Tobacco Use   Smoking status: Former    Current packs/day: 0.00    Average packs/day: 1 pack/day for 35.0 years (35.0 ttl pk-yrs)    Types: Cigarettes    Start date: 01/25/1974    Quit date: 01/25/2009    Years since quitting: 14.8   Smokeless tobacco: Never  Vaping Use   Vaping status: Never Used  Substance and Sexual Activity   Alcohol  use: No    Comment: former alcoholic   Drug use: No    Comment: former cocaine abuse   Sexual activity: Not Currently  Other Topics Concern   Not on file  Social History Narrative   Not on file   Social Drivers of Health   Financial Resource Strain: Not on file  Food Insecurity: Not on file  Transportation Needs: Not on file  Physical Activity: Not on file  Stress: Not on file  Social Connections: Not on file  Intimate Partner Violence: Not on file    Family History  Problem Relation Age of Onset   Diabetes Brother    Diabetes Sister    Diabetes Father    Hypertension Father    Diabetes Mother    Hypertension Mother     Hypertension Sister    Hypertension Brother     Allergies  Allergen Reactions   Bee Venom Anaphylaxis and Swelling   Questran [Cholestyramine] Other (See Comments)    Reaction unknown    Outpatient Medications Prior to Visit  Medication Sig   acetaminophen  (TYLENOL ) 325 MG tablet Take 325 mg by mouth every 6 (six) hours as needed for moderate pain (pain score 4-6).   albuterol  (VENTOLIN  HFA) 108 (90 Base) MCG/ACT inhaler Inhale 2 puffs into the lungs every 6 (six) hours as needed for wheezing or shortness of breath.   aspirin  EC 81 MG tablet Take 81 mg by mouth daily.   Calcium  Carbonate-Vitamin D 600-400 MG-UNIT tablet Take 1 tablet by mouth 2 (two) times daily.    clopidogrel (PLAVIX) 75 MG tablet Take 75 mg by mouth daily.   clotrimazole (LOTRIMIN) 1 % cream Apply 1 Application topically.   empagliflozin  (JARDIANCE ) 10 MG TABS tablet Take 1 tablet (10 mg total) by mouth daily before breakfast.   ezetimibe (ZETIA) 10 MG tablet Take 10 mg by mouth daily.   famotidine  (PEPCID ) 40 MG tablet Take 40 mg by mouth 2 (two) times daily.   finasteride  (PROSCAR ) 5 MG tablet Take 5 mg by mouth daily.   fluticasone  (FLONASE ) 50 MCG/ACT nasal spray Place 2 sprays into both nostrils daily as needed for allergies or rhinitis.   furosemide  (LASIX ) 20 MG tablet Take 1 tablet (20 mg total) by mouth daily as needed for fluid or edema.   Glycerin-Hypromellose-PEG 400 (DRY EYE RELIEF DROPS) 0.2-0.2-1 % SOLN Place 1 drop into both eyes daily as needed (Dry eye).   insulin  glargine (LANTUS ) 100 UNIT/ML injection Inject 0.5 mLs (50 Units total) into the skin at bedtime.   magnesium  oxide (MAG-OX) 400 MG tablet Take 400 mg by mouth daily.   metoprolol  succinate (TOPROL -XL) 25 MG 24 hr tablet Take 1 tablet by mouth once daily   pantoprazole  (PROTONIX ) 40 MG tablet Take 40 mg by mouth daily.   ramipril  (ALTACE ) 5 MG capsule Take 1 capsule (5 mg total) by mouth daily.   ranolazine  (RANEXA ) 1000 MG SR tablet  Take 1 tablet by mouth twice daily   rosuvastatin  (CRESTOR ) 20 MG tablet Take 20 mg by mouth daily.    tamsulosin  (FLOMAX ) 0.4 MG CAPS capsule Take 1 capsule (0.4 mg total) by mouth daily after breakfast.   venlafaxine  XR (EFFEXOR -XR) 75 MG 24 hr capsule Take 225 mg by mouth daily with breakfast.   No facility-administered medications prior to visit.    Review of Systems  Constitutional:  Negative for weight loss (gained 4 lbs).  Eyes: Negative.   Respiratory: Negative.  Cardiovascular: Negative.   Gastrointestinal: Negative.   Genitourinary:  Positive for frequency.  Skin: Negative.   Neurological: Negative.   Endo/Heme/Allergies: Negative.        Objective:   BP (!) 160/90 (Cuff Size: Normal)   Pulse 60   Ht 6' (1.829 m)   Wt 189 lb (85.7 kg)   SpO2 98%   BMI 25.63 kg/m   Vitals:   11/16/23 0902 11/16/23 0926  BP: (!) 162/102 (!) 160/90  Pulse: 60   Height: 6' (1.829 m)   Weight: 189 lb (85.7 kg)   SpO2: 98%   BMI (Calculated): 25.63     Physical Exam Vitals reviewed.  Constitutional:      Appearance: Normal appearance.  HENT:     Head: Normocephalic.     Left Ear: There is no impacted cerumen.     Nose: Nose normal.     Mouth/Throat:     Mouth: Mucous membranes are moist.     Pharynx: No posterior oropharyngeal erythema.  Eyes:     Extraocular Movements: Extraocular movements intact.     Pupils: Pupils are equal, round, and reactive to light.  Cardiovascular:     Rate and Rhythm: Regular rhythm.     Chest Wall: PMI is not displaced.     Pulses: Normal pulses.     Heart sounds: S1 normal and S2 normal. Murmur heard.     Crescendo decrescendo systolic murmur is present with a grade of 3/6.  Pulmonary:     Effort: Pulmonary effort is normal.     Breath sounds: Normal air entry. No rhonchi or rales.  Abdominal:     General: Abdomen is flat. Bowel sounds are normal. There is no distension.     Palpations: Abdomen is soft. There is no hepatomegaly,  splenomegaly or mass.     Tenderness: There is no abdominal tenderness.  Musculoskeletal:        General: Normal range of motion.     Cervical back: Normal range of motion and neck supple.     Right lower leg: No edema.     Left lower leg: No edema.  Skin:    General: Skin is warm and dry.  Neurological:     General: No focal deficit present.     Mental Status: He is alert and oriented to person, place, and time.     Cranial Nerves: No cranial nerve deficit.     Motor: No weakness.  Psychiatric:        Mood and Affect: Mood normal.        Behavior: Behavior normal.      Results for orders placed or performed in visit on 11/16/23  POCT CBG (Fasting - Glucose)  Result Value Ref Range   Glucose Fasting, POC 90 70 - 99 mg/dL    Recent Results (from the past 2160 hours)  Comprehensive metabolic panel     Status: None   Collection Time: 09/29/23  9:33 AM  Result Value Ref Range   Glucose 95 70 - 99 mg/dL   BUN 19 8 - 27 mg/dL   Creatinine, Ser 8.83 0.76 - 1.27 mg/dL   eGFR 70 >40 fO/fpw/8.26   BUN/Creatinine Ratio 16 10 - 24   Sodium 139 134 - 144 mmol/L   Potassium 3.6 3.5 - 5.2 mmol/L   Chloride 104 96 - 106 mmol/L   CO2 21 20 - 29 mmol/L   Calcium  9.4 8.6 - 10.2 mg/dL   Total Protein 6.6 6.0 -  8.5 g/dL   Albumin  4.2 3.9 - 4.9 g/dL   Globulin, Total 2.4 1.5 - 4.5 g/dL   Bilirubin Total 0.7 0.0 - 1.2 mg/dL   Alkaline Phosphatase 64 44 - 121 IU/L    Comment: **Effective October 10, 2023 Alkaline Phosphatase**   reference interval will be changing to:              Age                Male          Male           0 -  5 days         47 - 127       47 - 127           6 - 10 days         29 - 242       29 - 242          11 - 20 days        109 - 357      109 - 357          21 - 30 days         94 - 494       94 - 494           1 -  2 months      149 - 539      149 - 539           3 -  6 months      131 - 452      131 - 452           7 - 11 months      117 - 401       117 - 401   12 months -  6 years       158 - 369      158 - 369           7 - 12 years       150 - 409      150 - 409               13 years       156 - 435       78 - 227               14 years       114 - 375       64 - 161               15 years        88 - 279       56 - 134               16 years        74 - 207       51 - 121               17 years        63 - 161       47 - 113          18 - 20 years        51 - 125       42 - 106          21 - 50 years  47 - 123       41 - 116          51 - 80 years        49 - 135       51 - 125              >80 years        48 - 129       48 - 129    AST 15 0 - 40 IU/L   ALT 9 0 - 44 IU/L  Lipid panel     Status: Abnormal   Collection Time: 09/29/23  9:33 AM  Result Value Ref Range   Cholesterol, Total 163 100 - 199 mg/dL   Triglycerides 833 (H) 0 - 149 mg/dL   HDL 67 >60 mg/dL   VLDL Cholesterol Cal 28 5 - 40 mg/dL   LDL Chol Calc (NIH) 68 0 - 99 mg/dL   Chol/HDL Ratio 2.4 0.0 - 5.0 ratio    Comment:                                   T. Chol/HDL Ratio                                             Men  Women                               1/2 Avg.Risk  3.4    3.3                                   Avg.Risk  5.0    4.4                                2X Avg.Risk  9.6    7.1                                3X Avg.Risk 23.4   11.0   Hemoglobin A1c     Status: None   Collection Time: 09/29/23  9:34 AM  Result Value Ref Range   Hgb A1c MFr Bld 5.4 4.8 - 5.6 %    Comment:          Prediabetes: 5.7 - 6.4          Diabetes: >6.4          Glycemic control for adults with diabetes: <7.0    Est. average glucose Bld gHb Est-mCnc 108 mg/dL  POCT CBG (Fasting - Glucose)     Status: None   Collection Time: 10/05/23  9:08 AM  Result Value Ref Range   Glucose Fasting, POC 99 70 - 99 mg/dL  POCT CBG (Fasting - Glucose)     Status: None   Collection Time: 10/26/23 11:02 AM  Result Value Ref Range   Glucose Fasting, POC 98 70 - 99 mg/dL  POCT  CBG (Fasting - Glucose)     Status: None   Collection Time: 11/16/23  9:08 AM  Result Value Ref Range  Glucose Fasting, POC 90 70 - 99 mg/dL      Assessment & Plan:  Donyea was seen today for follow-up.  Primary hypertension  Microalbuminuria due to type 2 diabetes mellitus (HCC) -     POCT CBG (Fasting - Glucose)  Uncontrolled hypertension   Check bp, log at home and bring to next visit. Problem List Items Addressed This Visit       Cardiovascular and Mediastinum   Hypertension - Primary   Uncontrolled hypertension     Endocrine   Microalbuminuria due to type 2 diabetes mellitus (HCC)   Relevant Orders   POCT CBG (Fasting - Glucose) (Completed)    Return in about 2 weeks (around 11/30/2023) for BP followup.   Total time spent: 20 minutes. This time includes review of previous notes and results and patient face to face interaction during today'Kendyll Huettner visit.    Sherrill Cinderella Perry, MD  11/16/2023   This document may have been prepared by [facility] Voice Recognition software and as such may include unintentional dictation errors.

## 2023-11-25 ENCOUNTER — Other Ambulatory Visit: Payer: Self-pay | Admitting: Internal Medicine

## 2023-11-25 DIAGNOSIS — I10 Essential (primary) hypertension: Secondary | ICD-10-CM

## 2023-11-29 ENCOUNTER — Ambulatory Visit: Admitting: Internal Medicine

## 2023-11-29 ENCOUNTER — Encounter: Payer: Self-pay | Admitting: Internal Medicine

## 2023-11-29 VITALS — BP 138/89 | HR 61 | Temp 97.9°F | Ht 72.0 in | Wt 189.2 lb

## 2023-11-29 DIAGNOSIS — N1831 Chronic kidney disease, stage 3a: Secondary | ICD-10-CM | POA: Diagnosis not present

## 2023-11-29 DIAGNOSIS — I10 Essential (primary) hypertension: Secondary | ICD-10-CM

## 2023-11-29 DIAGNOSIS — H4020X0 Unspecified primary angle-closure glaucoma, stage unspecified: Secondary | ICD-10-CM | POA: Diagnosis not present

## 2023-11-29 DIAGNOSIS — E1169 Type 2 diabetes mellitus with other specified complication: Secondary | ICD-10-CM | POA: Diagnosis not present

## 2023-11-29 DIAGNOSIS — Z23 Encounter for immunization: Secondary | ICD-10-CM | POA: Diagnosis not present

## 2023-11-29 DIAGNOSIS — N401 Enlarged prostate with lower urinary tract symptoms: Secondary | ICD-10-CM

## 2023-11-29 DIAGNOSIS — R351 Nocturia: Secondary | ICD-10-CM

## 2023-11-29 DIAGNOSIS — E7800 Pure hypercholesterolemia, unspecified: Secondary | ICD-10-CM

## 2023-11-29 DIAGNOSIS — E785 Hyperlipidemia, unspecified: Secondary | ICD-10-CM

## 2023-11-29 LAB — POCT CBG (FASTING - GLUCOSE)-MANUAL ENTRY: Glucose Fasting, POC: 100 mg/dL — AB (ref 70–99)

## 2023-11-29 NOTE — Progress Notes (Signed)
 Established Patient Office Visit  Subjective:  Patient ID: [name], male    DOB: 1960-04-19  Age: 63 y.o. MRN: [id number]  Chief Complaint  Patient presents with   Follow-up    2 week BP follow up    No new complaints, here for BP follow up and medication refills.     No other concerns at this time.   Past Medical History:  Diagnosis Date   Asthma    Cancer [facility])    prostate   Chronic back pain    Chronic neck pain    Cirrhosis of liver (HCC)    Complicated migraine 06/16/2011   Coronary artery disease    Depression    Diabetes mellitus without complication (HCC)    Gallstones    Headache(784.0)    Hepatitis C    Hypercholesteremia    Hypertension    Left leg paresthesias    Myocardial infarction (HCC)    Stroke (HCC)    TIA   Vertigo     Past Surgical History:  Procedure Laterality Date   BYPASS GRAFT     triple bypass surgery   CARDIAC SURGERY     COLONOSCOPY WITH PROPOFOL  N/A 02/15/2017   Procedure: COLONOSCOPY WITH PROPOFOL ;  Surgeon: Janalyn Keene NOVAK, MD;  Location: [facility];  Service: Endoscopy;  Laterality: N/A;   COLONOSCOPY WITH PROPOFOL  N/A 02/15/2022   Procedure: COLONOSCOPY WITH PROPOFOL ;  Surgeon: Therisa Bi, MD;  Location: [facility] ENDOSCOPY;  Service: Gastroenterology;  Laterality: N/A;   COLONOSCOPY WITH PROPOFOL  N/A 04/16/2022   Procedure: COLONOSCOPY WITH PROPOFOL ;  Surgeon: Therisa Bi, MD;  Location: [facility] ENDOSCOPY;  Service: Gastroenterology;  Laterality: N/A;   CORONARY ANGIOPLASTY     CORONARY ARTERY BYPASS GRAFT     ESOPHAGOGASTRODUODENOSCOPY (EGD) WITH PROPOFOL  N/A 02/15/2017   Procedure: ESOPHAGOGASTRODUODENOSCOPY (EGD) WITH PROPOFOL ;  Surgeon: Janalyn Keene NOVAK, MD;  Location: [facility];  Service: Endoscopy;  Laterality: N/A;   HAND SURGERY  1984   KNEE ARTHROPLASTY Left 05/04/2021   LEFT HEART CATH AND CORONARY ANGIOGRAPHY Left 04/29/2017   Procedure: LEFT HEART CATH AND CORONARY ANGIOGRAPHY;   Surgeon: Fernand Denyse LABOR, MD;  Location: [facility] INVASIVE CV LAB;  Service: Cardiovascular;  Laterality: Left;   LEFT HEART CATH AND CORONARY ANGIOGRAPHY Left 01/08/2019   Procedure: LEFT HEART CATH AND CORONARY ANGIOGRAPHY;  Surgeon: Fernand Denyse LABOR, MD;  Location: [facility] INVASIVE CV LAB;  Service: Cardiovascular;  Laterality: Left;   LEFT HEART CATH AND CORONARY ANGIOGRAPHY N/A 02/19/2021   Procedure: LEFT HEART CATH AND CORONARY ANGIOGRAPHY with intervention;  Surgeon: Fernand Denyse LABOR, MD;  Location: [facility] INVASIVE CV LAB;  Service: Cardiovascular;  Laterality: N/A;   ORTHOPEDIC SURGERY     on my legs    Social History   Socioeconomic History   Marital status: Legally Separated    Spouse name: Not on file   Number of children: Not on file   Years of education: Not on file   Highest education level: Not on file  Occupational History   Not on file  Tobacco Use   Smoking status: Former    Current packs/day: 0.00    Average packs/day: 1 pack/day for 35.0 years (35.0 ttl pk-yrs)    Types: Cigarettes    Start date: 01/25/1974    Quit date: 01/25/2009    Years since quitting: 14.8   Smokeless tobacco: Never  Vaping Use   Vaping status: Never Used  Substance and Sexual Activity   Alcohol  use: No  Comment: former alcoholic   Drug use: No    Comment: former cocaine abuse   Sexual activity: Not Currently  Other Topics Concern   Not on file  Social History Narrative   Not on file   Social Drivers of Health   Financial Resource Strain: Not on file  Food Insecurity: Not on file  Transportation Needs: Not on file  Physical Activity: Not on file  Stress: Not on file  Social Connections: Not on file  Intimate Partner Violence: Not on file    Family History  Problem Relation Age of Onset   Diabetes Brother    Diabetes Sister    Diabetes Father    Hypertension Father    Diabetes Mother    Hypertension Mother    Hypertension Sister    Hypertension Brother     Allergies   Allergen Reactions   Bee Venom Anaphylaxis and Swelling   Questran [Cholestyramine] Other (See Comments)    Reaction unknown    Outpatient Medications Prior to Visit  Medication Sig   acetaminophen  (TYLENOL ) 325 MG tablet Take 325 mg by mouth every 6 (six) hours as needed for moderate pain (pain score 4-6).   albuterol  (VENTOLIN  HFA) 108 (90 Base) MCG/ACT inhaler Inhale 2 puffs into the lungs every 6 (six) hours as needed for wheezing or shortness of breath.   aspirin  EC 81 MG tablet Take 81 mg by mouth daily.   Calcium  Carbonate-Vitamin D 600-400 MG-UNIT tablet Take 1 tablet by mouth 2 (two) times daily.    clopidogrel (PLAVIX) 75 MG tablet Take 75 mg by mouth daily.   clotrimazole (LOTRIMIN) 1 % cream Apply 1 Application topically.   empagliflozin  (JARDIANCE ) 10 MG TABS tablet Take 1 tablet (10 mg total) by mouth daily before breakfast.   ezetimibe (ZETIA) 10 MG tablet Take 10 mg by mouth daily.   famotidine  (PEPCID ) 40 MG tablet Take 40 mg by mouth 2 (two) times daily.   finasteride  (PROSCAR ) 5 MG tablet Take 5 mg by mouth daily.   fluticasone  (FLONASE ) 50 MCG/ACT nasal spray Place 2 sprays into both nostrils daily as needed for allergies or rhinitis.   furosemide  (LASIX ) 20 MG tablet Take 1 tablet (20 mg total) by mouth daily as needed for fluid or edema.   Glycerin-Hypromellose-PEG 400 (DRY EYE RELIEF DROPS) 0.2-0.2-1 % SOLN Place 1 drop into both eyes daily as needed (Dry eye).   insulin  glargine (LANTUS ) 100 UNIT/ML injection Inject 0.5 mLs (50 Units total) into the skin at bedtime.   magnesium  oxide (MAG-OX) 400 MG tablet Take 400 mg by mouth daily.   metoprolol  succinate (TOPROL -XL) 25 MG 24 hr tablet Take 1 tablet by mouth once daily   pantoprazole  (PROTONIX ) 40 MG tablet Take 40 mg by mouth daily.   ramipril  (ALTACE ) 5 MG capsule Take 1 capsule by mouth once daily   ranolazine  (RANEXA ) 1000 MG SR tablet Take 1 tablet by mouth twice daily   rosuvastatin  (CRESTOR ) 20 MG tablet  Take 20 mg by mouth daily.    tamsulosin  (FLOMAX ) 0.4 MG CAPS capsule Take 1 capsule (0.4 mg total) by mouth daily after breakfast.   venlafaxine  XR (EFFEXOR -XR) 75 MG 24 hr capsule Take 225 mg by mouth daily with breakfast.   No facility-administered medications prior to visit.    Review of Systems  Constitutional:  Negative for weight loss (gained 4 lbs).  Eyes: Negative.   Respiratory: Negative.    Cardiovascular: Negative.   Gastrointestinal: Negative.   Genitourinary:  Positive  for frequency.  Skin: Negative.   Neurological: Negative.   Endo/Heme/Allergies: Negative.        Objective:   BP 138/89   Pulse 61   Temp 97.9 F (36.6 C)   Ht 6' (1.829 m)   Wt 189 lb 3.2 oz (85.8 kg)   SpO2 98%   BMI 25.66 kg/m   Vitals:   11/29/23 1031  BP: 138/89  Pulse: 61  Temp: 97.9 F (36.6 C)  Height: 6' (1.829 m)  Weight: 189 lb 3.2 oz (85.8 kg)  SpO2: 98%  BMI (Calculated): 25.65    Physical Exam Vitals reviewed.  Constitutional:      Appearance: Normal appearance.  HENT:     Head: Normocephalic.     Left Ear: There is no impacted cerumen.     Nose: Nose normal.     Mouth/Throat:     Mouth: Mucous membranes are moist.     Pharynx: No posterior oropharyngeal erythema.  Eyes:     Extraocular Movements: Extraocular movements intact.     Pupils: Pupils are equal, round, and reactive to light.  Cardiovascular:     Rate and Rhythm: Regular rhythm.     Chest Wall: PMI is not displaced.     Pulses: Normal pulses.     Heart sounds: S1 normal and S2 normal. Murmur heard.     Crescendo decrescendo systolic murmur is present with a grade of 3/6.  Pulmonary:     Effort: Pulmonary effort is normal.     Breath sounds: Normal air entry. No rhonchi or rales.  Abdominal:     General: Abdomen is flat. Bowel sounds are normal. There is no distension.     Palpations: Abdomen is soft. There is no hepatomegaly, splenomegaly or mass.     Tenderness: There is no abdominal  tenderness.  Musculoskeletal:        General: Normal range of motion.     Cervical back: Normal range of motion and neck supple.     Right lower leg: No edema.     Left lower leg: No edema.  Skin:    General: Skin is warm and dry.  Neurological:     General: No focal deficit present.     Mental Status: He is alert and oriented to person, place, and time.     Cranial Nerves: No cranial nerve deficit.     Motor: No weakness.  Psychiatric:        Mood and Affect: Mood normal.        Behavior: Behavior normal.      Results for orders placed or performed in visit on 11/29/23  POCT CBG (Fasting - Glucose)  Result Value Ref Range   Glucose Fasting, POC 100 (A) 70 - 99 mg/dL    Recent Results (from the past 2160 hours)  Comprehensive metabolic panel     Status: None   Collection Time: 09/29/23  9:33 AM  Result Value Ref Range   Glucose 95 70 - 99 mg/dL   BUN 19 8 - 27 mg/dL   Creatinine, Ser 8.83 0.76 - 1.27 mg/dL   eGFR 70 >40 fO/fpw/8.26   BUN/Creatinine Ratio 16 10 - 24   Sodium 139 134 - 144 mmol/L   Potassium 3.6 3.5 - 5.2 mmol/L   Chloride 104 96 - 106 mmol/L   CO2 21 20 - 29 mmol/L   Calcium  9.4 8.6 - 10.2 mg/dL   Total Protein 6.6 6.0 - 8.5 g/dL   Albumin  4.2 3.9 -  4.9 g/dL   Globulin, Total 2.4 1.5 - 4.5 g/dL   Bilirubin Total 0.7 0.0 - 1.2 mg/dL   Alkaline Phosphatase 64 44 - 121 IU/L    Comment: **Effective October 10, 2023 Alkaline Phosphatase**   reference interval will be changing to:              Age                Male          Male           0 -  5 days         47 - 127       47 - 127           6 - 10 days         29 - 242       29 - 242          11 - 20 days        109 - 357      109 - 357          21 - 30 days         94 - 494       94 - 494           1 -  2 months      149 - 539      149 - 539           3 -  6 months      131 - 452      131 - 452           7 - 11 months      117 - 401      117 - 401   12 months -  6 years       158 - 369      158  - 369           7 - 12 years       150 - 409      150 - 409               13 years       156 - 435       78 - 227               14 years       114 - 375       64 - 161               15 years        88 - 279       56 - 134               16 years        74 - 207       51 - 121               17 years        63 - 161       47 - 113          18 - 20 years        51 - 125       42 - 106          21 - 50 years         52 - 123  41 - 116          51 - 80 years        49 - 135       51 - 125              >80 years        48 - 129       48 - 129    AST 15 0 - 40 IU/L   ALT 9 0 - 44 IU/L  Lipid panel     Status: Abnormal   Collection Time: 09/29/23  9:33 AM  Result Value Ref Range   Cholesterol, Total 163 100 - 199 mg/dL   Triglycerides 833 (H) 0 - 149 mg/dL   HDL 67 >60 mg/dL   VLDL Cholesterol Cal 28 5 - 40 mg/dL   LDL Chol Calc (NIH) 68 0 - 99 mg/dL   Chol/HDL Ratio 2.4 0.0 - 5.0 ratio    Comment:                                   T. Chol/HDL Ratio                                             Men  Women                               1/2 Avg.Risk  3.4    3.3                                   Avg.Risk  5.0    4.4                                2X Avg.Risk  9.6    7.1                                3X Avg.Risk 23.4   11.0   Hemoglobin A1c     Status: None   Collection Time: 09/29/23  9:34 AM  Result Value Ref Range   Hgb A1c MFr Bld 5.4 4.8 - 5.6 %    Comment:          Prediabetes: 5.7 - 6.4          Diabetes: >6.4          Glycemic control for adults with diabetes: <7.0    Est. average glucose Bld gHb Est-mCnc 108 mg/dL  POCT CBG (Fasting - Glucose)     Status: None   Collection Time: 10/05/23  9:08 AM  Result Value Ref Range   Glucose Fasting, POC 99 70 - 99 mg/dL  POCT CBG (Fasting - Glucose)     Status: None   Collection Time: 10/26/23 11:02 AM  Result Value Ref Range   Glucose Fasting, POC 98 70 - 99 mg/dL  POCT CBG (Fasting - Glucose)     Status: None   Collection Time:  11/16/23  9:08 AM  Result Value Ref Range   Glucose Fasting, POC 90 70 - 99 mg/dL  POCT CBG (Fasting - Glucose)     Status: Abnormal   Collection Time: 11/29/23 10:37 AM  Result Value Ref Range   Glucose Fasting, POC 100 (A) 70 - 99 mg/dL      Assessment & Plan:  Peace was seen today for follow-up.  Primary hypertension -     CBC With Diff/Platelet  Type 2 diabetes mellitus with hyperlipidemia (HCC) -     POCT CBG (Fasting - Glucose) -     Hemoglobin A1c  Primary angle closure glaucoma of left eye, unspecified glaucoma stage, unspecified primary angle-closure glaucoma type  CKD stage 3a, GFR 45-59 ml/min (HCC) -     Comprehensive metabolic panel with GFR  Hypercholesteremia -     Lipid panel  Benign prostatic hyperplasia with nocturia -     PSA    Problem List Items Addressed This Visit       Cardiovascular and Mediastinum   Hypertension - Primary   Relevant Orders   CBC With Diff/Platelet     Endocrine   Type 2 diabetes mellitus with hyperlipidemia (HCC)   Relevant Orders   POCT CBG (Fasting - Glucose) (Completed)     Genitourinary   CKD stage 3a, GFR 45-59 ml/min (HCC)     Other   Hypercholesteremia   Benign prostatic hyperplasia with nocturia   Relevant Orders   PSA   Other Visit Diagnoses       Primary angle closure glaucoma of left eye, unspecified glaucoma stage, unspecified primary angle-closure glaucoma type           Return in about 10 weeks (around 02/07/2024) for cpe with labs prior, welcome to medicare.   Total time spent: 20 minutes. This time includes review of previous notes and results and patient face to face interaction during today'Sneijder Bernards visit.    Sherrill Cinderella Perry, MD  11/29/2023   This document may have been prepared by [facility] Voice Recognition software and as such may include unintentional dictation errors.

## 2023-12-01 ENCOUNTER — Other Ambulatory Visit: Payer: Self-pay

## 2023-12-01 DIAGNOSIS — Z23 Encounter for immunization: Secondary | ICD-10-CM

## 2024-01-17 ENCOUNTER — Other Ambulatory Visit: Payer: Self-pay | Admitting: Cardiovascular Disease

## 2024-02-03 ENCOUNTER — Ambulatory Visit: Admitting: Internal Medicine

## 2024-02-07 ENCOUNTER — Ambulatory Visit: Admitting: Internal Medicine

## 2024-02-07 VITALS — BP 120/68 | HR 64 | Temp 98.0°F | Ht 72.0 in | Wt 188.0 lb

## 2024-02-07 DIAGNOSIS — N1831 Chronic kidney disease, stage 3a: Secondary | ICD-10-CM

## 2024-02-07 DIAGNOSIS — Z1389 Encounter for screening for other disorder: Secondary | ICD-10-CM

## 2024-02-07 DIAGNOSIS — E1165 Type 2 diabetes mellitus with hyperglycemia: Secondary | ICD-10-CM | POA: Diagnosis not present

## 2024-02-07 DIAGNOSIS — Z739 Problem related to life management difficulty, unspecified: Secondary | ICD-10-CM | POA: Diagnosis not present

## 2024-02-07 DIAGNOSIS — Z1331 Encounter for screening for depression: Secondary | ICD-10-CM

## 2024-02-07 DIAGNOSIS — E7800 Pure hypercholesterolemia, unspecified: Secondary | ICD-10-CM | POA: Diagnosis not present

## 2024-02-07 DIAGNOSIS — Z0001 Encounter for general adult medical examination with abnormal findings: Secondary | ICD-10-CM | POA: Diagnosis not present

## 2024-02-07 DIAGNOSIS — E785 Hyperlipidemia, unspecified: Secondary | ICD-10-CM

## 2024-02-07 DIAGNOSIS — E1169 Type 2 diabetes mellitus with other specified complication: Secondary | ICD-10-CM

## 2024-02-07 DIAGNOSIS — Z0130 Encounter for examination of blood pressure without abnormal findings: Secondary | ICD-10-CM

## 2024-02-07 LAB — POCT CBG (FASTING - GLUCOSE)-MANUAL ENTRY: Glucose Fasting, POC: 104 mg/dL — AB (ref 70–99)

## 2024-02-07 NOTE — Progress Notes (Signed)
 "  Established Patient Office Visit  Subjective:  Patient ID: [name], male    DOB: 06-Sep-1960  Age: 64 y.o. MRN: [id number]  No chief complaint on file.   No new complaints, here for AWV refer to quality metrics and scanned documents.     No other concerns at this time.   Past Medical History:  Diagnosis Date   Asthma    Cancer [facility])    prostate   Chronic back pain    Chronic neck pain    Cirrhosis of liver (HCC)    Complicated migraine 06/16/2011   Coronary artery disease    Depression    Diabetes mellitus without complication (HCC)    Gallstones    Headache(784.0)    Hepatitis C    Hypercholesteremia    Hypertension    Left leg paresthesias    Myocardial infarction (HCC)    Stroke (HCC)    TIA   Vertigo     Past Surgical History:  Procedure Laterality Date   BYPASS GRAFT     triple bypass surgery   CARDIAC SURGERY     COLONOSCOPY WITH PROPOFOL  N/A 02/15/2017   Procedure: COLONOSCOPY WITH PROPOFOL ;  Surgeon: Janalyn Keene NOVAK, MD;  Location: [facility];  Service: Endoscopy;  Laterality: N/A;   COLONOSCOPY WITH PROPOFOL  N/A 02/15/2022   Procedure: COLONOSCOPY WITH PROPOFOL ;  Surgeon: Therisa Bi, MD;  Location: [facility] ENDOSCOPY;  Service: Gastroenterology;  Laterality: N/A;   COLONOSCOPY WITH PROPOFOL  N/A 04/16/2022   Procedure: COLONOSCOPY WITH PROPOFOL ;  Surgeon: Therisa Bi, MD;  Location: [facility] ENDOSCOPY;  Service: Gastroenterology;  Laterality: N/A;   CORONARY ANGIOPLASTY     CORONARY ARTERY BYPASS GRAFT     ESOPHAGOGASTRODUODENOSCOPY (EGD) WITH PROPOFOL  N/A 02/15/2017   Procedure: ESOPHAGOGASTRODUODENOSCOPY (EGD) WITH PROPOFOL ;  Surgeon: Janalyn Keene NOVAK, MD;  Location: [facility];  Service: Endoscopy;  Laterality: N/A;   HAND SURGERY  1984   KNEE ARTHROPLASTY Left 05/04/2021   LEFT HEART CATH AND CORONARY ANGIOGRAPHY Left 04/29/2017   Procedure: LEFT HEART CATH AND CORONARY ANGIOGRAPHY;  Surgeon: Fernand Denyse LABOR, MD;   Location: [facility] INVASIVE CV LAB;  Service: Cardiovascular;  Laterality: Left;   LEFT HEART CATH AND CORONARY ANGIOGRAPHY Left 01/08/2019   Procedure: LEFT HEART CATH AND CORONARY ANGIOGRAPHY;  Surgeon: Fernand Denyse LABOR, MD;  Location: [facility] INVASIVE CV LAB;  Service: Cardiovascular;  Laterality: Left;   LEFT HEART CATH AND CORONARY ANGIOGRAPHY N/A 02/19/2021   Procedure: LEFT HEART CATH AND CORONARY ANGIOGRAPHY with intervention;  Surgeon: Fernand Denyse LABOR, MD;  Location: [facility] INVASIVE CV LAB;  Service: Cardiovascular;  Laterality: N/A;   ORTHOPEDIC SURGERY     on my legs    Social History   Socioeconomic History   Marital status: Legally Separated    Spouse name: Not on file   Number of children: Not on file   Years of education: Not on file   Highest education level: Not on file  Occupational History   Not on file  Tobacco Use   Smoking status: Former    Current packs/day: 0.00    Average packs/day: 1 pack/day for 35.0 years (35.0 ttl pk-yrs)    Types: Cigarettes    Start date: 01/25/1974    Quit date: 01/25/2009    Years since quitting: 15.0   Smokeless tobacco: Never  Vaping Use   Vaping status: Never Used  Substance and Sexual Activity   Alcohol  use: No    Comment: former alcoholic   Drug use:  No    Comment: former cocaine abuse   Sexual activity: Not Currently  Other Topics Concern   Not on file  Social History Narrative   Not on file   Social Drivers of Health   Tobacco Use: Medium Risk (11/29/2023)   Patient History    Smoking Tobacco Use: Former    Smokeless Tobacco Use: Never    Passive Exposure: Not on Actuary Strain: Not on file  Food Insecurity: Not on file  Transportation Needs: Not on file  Physical Activity: Not on file  Stress: Not on file  Social Connections: Not on file  Intimate Partner Violence: Not on file  Depression (PHQ2-9): Medium Risk (11/22/2022)   Depression (PHQ2-9)    PHQ-2 Score: 7  Alcohol  Screen: Not on file   Housing: Not on file  Utilities: Not on file  Health Literacy: Not on file    Family History  Problem Relation Age of Onset   Diabetes Brother    Diabetes Sister    Diabetes Father    Hypertension Father    Diabetes Mother    Hypertension Mother    Hypertension Sister    Hypertension Brother     Allergies[1]  Show/hide medication list[2]  Review of Systems  Constitutional:  Positive for weight loss (1 lb).  Eyes: Negative.   Respiratory: Negative.    Cardiovascular: Negative.   Gastrointestinal: Negative.   Genitourinary:  Positive for frequency.  Skin: Negative.   Neurological: Negative.   Endo/Heme/Allergies: Negative.        Objective:   BP 120/68   Pulse 64   Temp 98 F (36.7 C)   Ht 6' (1.829 m)   Wt 188 lb (85.3 kg)   SpO2 94%   BMI 25.50 kg/m   Vitals:   02/07/24 0941  BP: 120/68  Pulse: 64  Temp: 98 F (36.7 C)  Height: 6' (1.829 m)  Weight: 188 lb (85.3 kg)  SpO2: 94%  BMI (Calculated): 25.49    Physical Exam Vitals reviewed.  Constitutional:      Appearance: Normal appearance.  HENT:     Head: Normocephalic.     Left Ear: There is no impacted cerumen.     Nose: Nose normal.     Mouth/Throat:     Mouth: Mucous membranes are moist.     Pharynx: No posterior oropharyngeal erythema.  Eyes:     Extraocular Movements: Extraocular movements intact.     Pupils: Pupils are equal, round, and reactive to light.  Cardiovascular:     Rate and Rhythm: Regular rhythm.     Chest Wall: PMI is not displaced.     Pulses: Normal pulses.     Heart sounds: S1 normal and S2 normal. Murmur heard.     Crescendo decrescendo systolic murmur is present with a grade of 3/6.  Pulmonary:     Effort: Pulmonary effort is normal.     Breath sounds: Normal air entry. No rhonchi or rales.  Abdominal:     General: Abdomen is flat. Bowel sounds are normal. There is no distension.     Palpations: Abdomen is soft. There is no hepatomegaly, splenomegaly or mass.      Tenderness: There is no abdominal tenderness.  Musculoskeletal:        General: Normal range of motion.     Cervical back: Normal range of motion and neck supple.     Right lower leg: No edema.     Left lower leg: No edema.  Skin:    General: Skin is warm and dry.  Neurological:     General: No focal deficit present.     Mental Status: He is alert and oriented to person, place, and time.     Cranial Nerves: No cranial nerve deficit.     Motor: No weakness.  Psychiatric:        Mood and Affect: Mood normal.        Behavior: Behavior normal.      Results for orders placed or performed in visit on 02/07/24  POCT CBG (Fasting - Glucose)  Result Value Ref Range   Glucose Fasting, POC 104 (A) 70 - 99 mg/dL    Recent Results (from the past 2160 hours)  POCT CBG (Fasting - Glucose)     Status: None   Collection Time: 11/16/23  9:08 AM  Result Value Ref Range   Glucose Fasting, POC 90 70 - 99 mg/dL  POCT CBG (Fasting - Glucose)     Status: Abnormal   Collection Time: 11/29/23 10:37 AM  Result Value Ref Range   Glucose Fasting, POC 100 (A) 70 - 99 mg/dL  POCT CBG (Fasting - Glucose)     Status: Abnormal   Collection Time: 02/07/24  9:47 AM  Result Value Ref Range   Glucose Fasting, POC 104 (A) 70 - 99 mg/dL      Assessment & Plan:  Type 2 diabetes mellitus with hyperglycemia, unspecified whether long term insulin  use (HCC) -     POCT CBG (Fasting - Glucose)  CKD stage 3a, GFR 45-59 ml/min (HCC) -     Comprehensive metabolic panel with GFR  Hypercholesteremia -     Lipid panel  Type 2 diabetes mellitus with hyperlipidemia (HCC) -     Hemoglobin A1c    Problem List Items Addressed This Visit       Endocrine   Type 2 diabetes mellitus with hyperlipidemia (HCC)     Genitourinary   CKD stage 3a, GFR 45-59 ml/min (HCC)     Other   Hypercholesteremia   Other Visit Diagnoses       Type 2 diabetes mellitus with hyperglycemia, unspecified whether long term  insulin  use (HCC)    -  Primary   Relevant Orders   POCT CBG (Fasting - Glucose) (Completed)       Return in about 6 weeks (around 03/20/2024) for fu with labs prior.   Total time spent: 30 minutes. This time includes review of previous notes and results and patient face to face interaction during today'Reshunda Strider visit.    Sherrill Cinderella Perry, MD  02/07/2024   This document may have been prepared by [facility] Voice Recognition software and as such may include unintentional dictation errors.      [1]  Allergies Allergen Reactions   Bee Venom Anaphylaxis and Swelling   Questran [Cholestyramine] Other (See Comments)    Reaction unknown  [2]  Outpatient Medications Prior to Visit  Medication Sig   acetaminophen  (TYLENOL ) 325 MG tablet Take 325 mg by mouth every 6 (six) hours as needed for moderate pain (pain score 4-6).   albuterol  (VENTOLIN  HFA) 108 (90 Base) MCG/ACT inhaler Inhale 2 puffs into the lungs every 6 (six) hours as needed for wheezing or shortness of breath.   aspirin  EC 81 MG tablet Take 81 mg by mouth daily.   Calcium  Carbonate-Vitamin D 600-400 MG-UNIT tablet Take 1 tablet by mouth 2 (two) times daily.    clopidogrel (PLAVIX) 75 MG tablet Take 75  mg by mouth daily.   clotrimazole (LOTRIMIN) 1 % cream Apply 1 Application topically.   empagliflozin  (JARDIANCE ) 10 MG TABS tablet Take 1 tablet (10 mg total) by mouth daily before breakfast.   ezetimibe (ZETIA) 10 MG tablet Take 10 mg by mouth daily.   famotidine  (PEPCID ) 40 MG tablet Take 40 mg by mouth 2 (two) times daily.   finasteride  (PROSCAR ) 5 MG tablet Take 5 mg by mouth daily.   fluticasone  (FLONASE ) 50 MCG/ACT nasal spray Place 2 sprays into both nostrils daily as needed for allergies or rhinitis.   furosemide  (LASIX ) 20 MG tablet Take 1 tablet (20 mg total) by mouth daily as needed for fluid or edema.   Glycerin-Hypromellose-PEG 400 (DRY EYE RELIEF DROPS) 0.2-0.2-1 % SOLN Place 1 drop into both eyes daily as needed  (Dry eye).   insulin  glargine (LANTUS ) 100 UNIT/ML injection Inject 0.5 mLs (50 Units total) into the skin at bedtime.   magnesium  oxide (MAG-OX) 400 MG tablet Take 400 mg by mouth daily.   metoprolol  succinate (TOPROL -XL) 25 MG 24 hr tablet Take 1 tablet by mouth once daily   pantoprazole  (PROTONIX ) 40 MG tablet Take 40 mg by mouth daily.   ramipril  (ALTACE ) 5 MG capsule Take 1 capsule by mouth once daily   ranolazine  (RANEXA ) 1000 MG SR tablet Take 1 tablet by mouth twice daily   rosuvastatin  (CRESTOR ) 20 MG tablet Take 20 mg by mouth daily.    tamsulosin  (FLOMAX ) 0.4 MG CAPS capsule Take 1 capsule (0.4 mg total) by mouth daily after breakfast.   venlafaxine  XR (EFFEXOR -XR) 75 MG 24 hr capsule Take 225 mg by mouth daily with breakfast.   No facility-administered medications prior to visit.   "

## 2024-02-13 ENCOUNTER — Telehealth: Payer: Self-pay

## 2024-02-13 NOTE — Telephone Encounter (Signed)
 Please call the patient and find out if did in fact okay getting his medications sent to [facility], they said that he gave verbal confirmation on January 16th, if this is the case, send this back to me and I will send the rx's to the correct drs

## 2024-02-15 ENCOUNTER — Other Ambulatory Visit: Payer: Self-pay | Admitting: Internal Medicine

## 2024-02-15 ENCOUNTER — Other Ambulatory Visit: Payer: Self-pay | Admitting: Cardiovascular Disease

## 2024-02-15 DIAGNOSIS — I10 Essential (primary) hypertension: Secondary | ICD-10-CM

## 2024-02-21 ENCOUNTER — Other Ambulatory Visit: Payer: Self-pay | Admitting: Internal Medicine

## 2024-02-21 DIAGNOSIS — E1129 Type 2 diabetes mellitus with other diabetic kidney complication: Secondary | ICD-10-CM

## 2024-02-23 ENCOUNTER — Other Ambulatory Visit: Payer: Self-pay

## 2024-02-23 NOTE — Telephone Encounter (Signed)
 Rx sent to [facility] and will pend to [facility] once those have been sent

## 2024-02-24 ENCOUNTER — Other Ambulatory Visit: Payer: Self-pay

## 2024-02-24 DIAGNOSIS — I10 Essential (primary) hypertension: Secondary | ICD-10-CM

## 2024-02-24 DIAGNOSIS — E1129 Type 2 diabetes mellitus with other diabetic kidney complication: Secondary | ICD-10-CM

## 2024-02-24 DIAGNOSIS — E1169 Type 2 diabetes mellitus with other specified complication: Secondary | ICD-10-CM

## 2024-02-24 MED ORDER — RAMIPRIL 5 MG PO CAPS: 5.0000 mg | ORAL_CAPSULE | Freq: Every day | ORAL | 3 refills | Status: AC

## 2024-02-24 MED ORDER — INSULIN GLARGINE 100 UNIT/ML ~~LOC~~ SOLN: 50.0000 [IU] | Freq: Every day | SUBCUTANEOUS | 1 refills | Status: AC

## 2024-02-24 MED ORDER — METOPROLOL SUCCINATE ER 25 MG PO TB24: 25.0000 mg | ORAL_TABLET | Freq: Every day | ORAL | 0 refills | Status: AC

## 2024-02-24 MED ORDER — EMPAGLIFLOZIN 10 MG PO TABS: 10.0000 mg | ORAL_TABLET | Freq: Every day | ORAL | 3 refills | Status: AC

## 2024-02-24 MED ORDER — RANOLAZINE ER 1000 MG PO TB12: 1000.0000 mg | ORAL_TABLET | Freq: Two times a day (BID) | ORAL | 0 refills | Status: AC

## 2024-03-09 ENCOUNTER — Ambulatory Visit: Admitting: Internal Medicine

## 2024-03-23 ENCOUNTER — Ambulatory Visit: Admitting: Internal Medicine
